# Patient Record
Sex: Male | Born: 1940 | Race: Black or African American | Hispanic: No | Marital: Married | Smoking: Never smoker
Health system: Southern US, Community
[De-identification: ages and names within clinical notes are randomized; demographics above are authoritative.]

## PROBLEM LIST (undated history)

## (undated) DIAGNOSIS — I10 Essential (primary) hypertension: Secondary | ICD-10-CM

## (undated) DIAGNOSIS — M1990 Unspecified osteoarthritis, unspecified site: Secondary | ICD-10-CM

## (undated) DIAGNOSIS — C61 Malignant neoplasm of prostate: Secondary | ICD-10-CM

## (undated) DIAGNOSIS — Z9221 Personal history of antineoplastic chemotherapy: Secondary | ICD-10-CM

## (undated) DIAGNOSIS — E785 Hyperlipidemia, unspecified: Secondary | ICD-10-CM

## (undated) DIAGNOSIS — Z923 Personal history of irradiation: Secondary | ICD-10-CM

## (undated) DIAGNOSIS — M4800 Spinal stenosis, site unspecified: Secondary | ICD-10-CM

## (undated) DIAGNOSIS — C419 Malignant neoplasm of bone and articular cartilage, unspecified: Secondary | ICD-10-CM

## (undated) DIAGNOSIS — C801 Malignant (primary) neoplasm, unspecified: Secondary | ICD-10-CM

## (undated) DIAGNOSIS — E876 Hypokalemia: Secondary | ICD-10-CM

## (undated) DIAGNOSIS — H409 Unspecified glaucoma: Secondary | ICD-10-CM

## (undated) DIAGNOSIS — R630 Anorexia: Secondary | ICD-10-CM

## (undated) DIAGNOSIS — K219 Gastro-esophageal reflux disease without esophagitis: Secondary | ICD-10-CM

## (undated) DIAGNOSIS — R011 Cardiac murmur, unspecified: Secondary | ICD-10-CM

## (undated) DIAGNOSIS — M4802 Spinal stenosis, cervical region: Secondary | ICD-10-CM

## (undated) DIAGNOSIS — N189 Chronic kidney disease, unspecified: Secondary | ICD-10-CM

## (undated) DIAGNOSIS — K5792 Diverticulitis of intestine, part unspecified, without perforation or abscess without bleeding: Secondary | ICD-10-CM

## (undated) DIAGNOSIS — C9000 Multiple myeloma not having achieved remission: Secondary | ICD-10-CM

## (undated) DIAGNOSIS — N400 Enlarged prostate without lower urinary tract symptoms: Secondary | ICD-10-CM

## (undated) DIAGNOSIS — IMO0002 Reserved for concepts with insufficient information to code with codable children: Secondary | ICD-10-CM

## (undated) HISTORY — DX: Malignant (primary) neoplasm, unspecified: C80.1

## (undated) HISTORY — DX: Personal history of antineoplastic chemotherapy: Z92.21

## (undated) HISTORY — DX: Essential (primary) hypertension: I10

## (undated) HISTORY — DX: Unspecified osteoarthritis, unspecified site: M19.90

## (undated) HISTORY — DX: Malignant neoplasm of bone and articular cartilage, unspecified: C41.9

## (undated) HISTORY — DX: Hyperlipidemia, unspecified: E78.5

## (undated) HISTORY — DX: Anorexia: R63.0

## (undated) HISTORY — DX: Diverticulitis of intestine, part unspecified, without perforation or abscess without bleeding: K57.92

## (undated) HISTORY — DX: Spinal stenosis, site unspecified: M48.00

## (undated) HISTORY — DX: Reserved for concepts with insufficient information to code with codable children: IMO0002

## (undated) HISTORY — PX: SHOULDER SURGERY: SHX246

## (undated) HISTORY — DX: Hypokalemia: E87.6

## (undated) HISTORY — DX: Benign prostatic hyperplasia without lower urinary tract symptoms: N40.0

## (undated) HISTORY — DX: Unspecified glaucoma: H40.9

## (undated) HISTORY — DX: Chronic kidney disease, unspecified: N18.9

---

## 1986-07-20 HISTORY — PX: LIPOMA EXCISION: SHX5283

## 2002-10-26 LAB — HM COLONOSCOPY: HM Colonoscopy: NORMAL

## 2004-07-27 ENCOUNTER — Ambulatory Visit: Payer: Self-pay | Admitting: Internal Medicine

## 2004-09-12 ENCOUNTER — Ambulatory Visit: Payer: Self-pay | Admitting: Internal Medicine

## 2004-10-12 ENCOUNTER — Ambulatory Visit: Payer: Self-pay | Admitting: Internal Medicine

## 2004-10-19 ENCOUNTER — Ambulatory Visit: Payer: Self-pay

## 2005-03-22 ENCOUNTER — Ambulatory Visit: Payer: Self-pay | Admitting: Internal Medicine

## 2005-10-11 ENCOUNTER — Ambulatory Visit: Payer: Self-pay | Admitting: Internal Medicine

## 2005-10-18 ENCOUNTER — Ambulatory Visit: Payer: Self-pay | Admitting: Internal Medicine

## 2006-06-21 ENCOUNTER — Ambulatory Visit: Payer: Self-pay | Admitting: Internal Medicine

## 2006-08-20 HISTORY — PX: PROSTATE BIOPSY: SHX241

## 2006-10-22 ENCOUNTER — Ambulatory Visit: Payer: Self-pay | Admitting: Internal Medicine

## 2006-10-22 LAB — CONVERTED CEMR LAB
BUN: 18 mg/dL (ref 6–23)
Cholesterol: 130 mg/dL (ref 0–200)
GFR calc Af Amer: 96 mL/min
GFR calc non Af Amer: 80 mL/min
HDL: 41 mg/dL (ref >39.0)
Sodium: 143 meq/L (ref 135–145)

## 2007-09-16 ENCOUNTER — Encounter: Payer: Self-pay | Admitting: *Deleted

## 2007-09-16 DIAGNOSIS — I10 Essential (primary) hypertension: Secondary | ICD-10-CM | POA: Insufficient documentation

## 2007-09-16 DIAGNOSIS — E785 Hyperlipidemia, unspecified: Secondary | ICD-10-CM

## 2007-10-31 ENCOUNTER — Encounter: Payer: Self-pay | Admitting: Internal Medicine

## 2007-11-12 ENCOUNTER — Ambulatory Visit: Payer: Self-pay | Admitting: Internal Medicine

## 2007-11-12 DIAGNOSIS — N400 Enlarged prostate without lower urinary tract symptoms: Secondary | ICD-10-CM

## 2007-11-12 LAB — CONVERTED CEMR LAB
AST: 28 units/L (ref 0–37)
Albumin: 3.6 g/dL (ref 3.5–5.2)
Alkaline Phosphatase: 55 units/L (ref 39–117)
Bilirubin, Direct: 0.1 mg/dL (ref 0.0–0.3)
CO2: 36 meq/L — ABNORMAL HIGH (ref 19–32)
Cholesterol: 117 mg/dL (ref 0–200)
Glucose, Bld: 93 mg/dL (ref 70–99)
LDL Cholesterol: 65 mg/dL (ref 0–99)
TSH: 1.01 microintl units/mL (ref 0.35–5.50)
Total CHOL/HDL Ratio: 3.4
Triglycerides: 89 mg/dL (ref 0–149)
VLDL: 18 mg/dL (ref 0–40)

## 2008-04-18 ENCOUNTER — Emergency Department (HOSPITAL_COMMUNITY): Admission: EM | Admit: 2008-04-18 | Discharge: 2008-04-18 | Payer: Self-pay | Admitting: Family Medicine

## 2008-05-10 ENCOUNTER — Ambulatory Visit: Payer: Self-pay | Admitting: Internal Medicine

## 2008-05-10 DIAGNOSIS — J069 Acute upper respiratory infection, unspecified: Secondary | ICD-10-CM | POA: Insufficient documentation

## 2008-05-17 ENCOUNTER — Encounter: Payer: Self-pay | Admitting: Internal Medicine

## 2008-07-13 ENCOUNTER — Encounter: Payer: Self-pay | Admitting: Internal Medicine

## 2008-11-15 ENCOUNTER — Ambulatory Visit: Payer: Self-pay | Admitting: Internal Medicine

## 2008-11-15 LAB — CONVERTED CEMR LAB
ALT: 29 units/L (ref 0–53)
Albumin: 3.8 g/dL (ref 3.5–5.2)
BUN: 11 mg/dL (ref 6–23)
Bilirubin, Direct: 0.1 mg/dL (ref 0.0–0.3)
Chloride: 101 meq/L (ref 96–112)
Cholesterol: 121 mg/dL (ref 0–200)
Creatinine, Ser: 0.8 mg/dL (ref 0.4–1.5)
HDL: 36.4 mg/dL — ABNORMAL LOW (ref >39.00)
Sodium: 143 meq/L (ref 135–145)
Total CHOL/HDL Ratio: 3
Triglycerides: 106 mg/dL (ref 0.0–149.0)

## 2009-01-11 ENCOUNTER — Encounter: Payer: Self-pay | Admitting: Internal Medicine

## 2009-07-12 ENCOUNTER — Encounter: Payer: Self-pay | Admitting: Internal Medicine

## 2009-10-26 ENCOUNTER — Encounter: Admission: RE | Admit: 2009-10-26 | Discharge: 2009-10-26 | Payer: Self-pay | Admitting: Orthopedic Surgery

## 2009-11-07 ENCOUNTER — Encounter: Admission: RE | Admit: 2009-11-07 | Discharge: 2009-11-07 | Payer: Self-pay | Admitting: Orthopedic Surgery

## 2009-11-16 ENCOUNTER — Ambulatory Visit: Payer: Self-pay | Admitting: Internal Medicine

## 2009-11-16 LAB — CONVERTED CEMR LAB
BUN: 10 mg/dL (ref 6–23)
Bilirubin, Direct: 0.1 mg/dL (ref 0.0–0.3)
CO2: 34 meq/L — ABNORMAL HIGH (ref 19–32)
Calcium: 9.5 mg/dL (ref 8.4–10.5)
Chloride: 101 meq/L (ref 96–112)
GFR calc non Af Amer: 123.36 mL/min (ref >60)
Sodium: 143 meq/L (ref 135–145)
Total Bilirubin: 0.8 mg/dL (ref 0.3–1.2)
Total CHOL/HDL Ratio: 3
Total Protein: 8.3 g/dL (ref 6.0–8.3)
Triglycerides: 157 mg/dL — ABNORMAL HIGH (ref 0.0–149.0)
VLDL: 31.4 mg/dL (ref 0.0–40.0)

## 2009-11-17 ENCOUNTER — Encounter: Payer: Self-pay | Admitting: Internal Medicine

## 2010-01-12 ENCOUNTER — Encounter: Payer: Self-pay | Admitting: Internal Medicine

## 2010-07-18 ENCOUNTER — Encounter: Payer: Self-pay | Admitting: Internal Medicine

## 2010-09-07 ENCOUNTER — Ambulatory Visit
Admission: RE | Admit: 2010-09-07 | Discharge: 2010-09-07 | Payer: Self-pay | Source: Home / Self Care | Attending: Internal Medicine | Admitting: Internal Medicine

## 2010-09-19 NOTE — Letter (Signed)
[facility]   Imported By: Sherian Rein 07/25/2010 14:53:57  _____________________________________________________________________  External Attachment:    Type:   Image     Comment:   External Document

## 2010-09-19 NOTE — Assessment & Plan Note (Signed)
Summary: YEARLY---STC   Vital Signs:  Patient profile:   70 year old male Height:      69 inches Weight:      163 pounds BMI:     24.16 O2 Sat:      97 % on Room air Temp:     97.2 degrees F oral Pulse rate:   59 / minute BP sitting:   170 / 88  (left arm) Cuff size:   large  Vitals Entered By: [name] (November 16, 2009 10:38 AM)  O2 Flow:  Room air  Primary Care Provider:  Jacques Navy MD   History of Present Illness: Patient presents for a routine medical evaluation. In the interval since his last visigt he has been seeing Dr. Hazle Quant for early glaucoma. He has had laser surgery left eye and is on drops.  He had neck pain. Went to Dr. Montez Morita (ortho): had plain films and MRI. Reviewed images and radiology report with the patient. He is on naproxen sodium 375mg  two times a day and doing neck exercises. He has noted a rise in BP since starting NSAIDs: up to 170/80.    Current Medications (verified): 1)  Atenolol-Chlorthalidone 50-25 Mg  Tabs (Atenolol-Chlorthalidone) .... Take One Tablet Once Daily 2)  B-100   Tabs (Vitamins-Lipotropics) .... Take One Tablet Once Daily 3)  B Complex   Tabs (B Complex Vitamins) .... Take One Tablet Once Daily 4)  Vitamin E 400 Unit  Caps (Vitamin E) .... Take One Capsule Once Daily 5)  Zocor 20 Mg  Tabs (Simvastatin) .... Take One Tablet Once Daily 6)  Vitamin C 500 Mg  Tabs (Ascorbic Acid) .... Take One Tablet Once Daily 7)  Xalatan 0.005 %  Soln (Latanoprost) .... As Directed By Opthal 8)  Combigan 0.2-0.5 % Soln (Brimonidine Tartrate-Timolol) .... 2 Times A Day 9)  Lumigan 0.03 % Soln (Bimatoprost) .... Both Eyes One Drop Once Daily 10)  Naprelan 375 Mg Xr24h-Tab (Naproxen Sodium) .Marland Kitchen.. 1 Tab Once A Day  Allergies (verified): 1)  ! Adhesive Paper (Adhesive Tape)  Past History:  Past Medical History: Last updated: 11-27-2007 HYPERLIPIDEMIA, MILD (ICD-272.4) HYPERTENSION, MILD (ICD-401.1) Benign prostatic hypertrophy early  glaucoma  Past Surgical History: Last updated: 2007/11/27 excision of a lipoma-left shoulder/scapula  Family History: Last updated: 2007-11-27 father-deceased @ 90: pneumonia, BPH, CAD/PTCA mother- 85; decreased mobility, no major medical problems. 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  Review of Systems  The patient denies anorexia, fever, weight loss, weight gain, vision loss, hoarseness, chest pain, syncope, dyspnea on exertion, prolonged cough, headaches, abdominal pain, severe indigestion/heartburn, incontinence, muscle weakness, suspicious skin lesions, difficulty walking, depression, abnormal bleeding, enlarged lymph nodes, angioedema, and testicular masses.    Physical Exam  General:  Slender AA male who looks his stated age and is in no distress Head:  Normocephalic  and atraumatic without obvious abnormalities. No apparent alopecia or balding. Eyes:  vision grossly intact, pupils equal, and pupils round.  Inject bulbar conjunctiva OS. Further exam deferred to Dr. Tiffany Kocher:  External ear exam shows no significant lesions or deformities.  Otoscopic examination reveals clear canals, tympanic membranes are intact bilaterally without bulging,  retraction, inflammation or discharge. Hearing is grossly normal bilaterally. Nose:  no external deformity and no external erythema.   Mouth:  Oral mucosa and oropharynx without lesions or exudates.  Teeth in good repair. Neck:  full ROM, no thyromegaly, and no carotid bruits.   Chest Wall:  no deformities and no masses.   Lungs:  Normal respiratory effort, chest expands symmetrically. Lungs are clear to auscultation, no crackles or wheezes. Heart:  Normal rate and regular rhythm. S1 and S2 normal without gallop, murmur, click, rub or other extra sounds. Abdomen:  soft, non-tender, normal bowel sounds, no guarding, no rigidity, and no hepatomegaly.   Rectal:  deferred to GU Prostate:  deferred to GU Msk:  normal ROM, no joint tenderness, no joint swelling, no joint warmth, no redness over joints, and no joint deformities.   Pulses:  2+ radial and DP pulses Extremities:  No clubbing, cyanosis, edema, or deformity noted with normal full range of motion of all joints.   Neurologic:  alert & oriented X3, cranial nerves II-XII intact, strength normal in all extremities, gait normal, and DTRs symmetrical and normal.   Skin:  turgor normal, color normal, no ulcerations, and no edema.  Several small comedones on the upper back Cervical Nodes:  no anterior cervical adenopathy and no posterior cervical adenopathy.   Psych:  Oriented X3, memory intact for recent and remote, normally interactive, and good eye contact.     Impression & Recommendations:  Problem # 1:  BENIGN PROSTATIC HYPERTROPHY (ICD-600.00) Followed closely by GU. He has had Bx x 2 that were unremarkable.  Plan - continued surveillance  Problem # 2:  HYPERLIPIDEMIA, MILD (ICD-272.4) For lab today with recommendations to follow.  His updated medication list for this problem includes:    Zocor 20 Mg Tabs (Simvastatin) .Marland Kitchen... Take one tablet once daily  Orders: TLB-Lipid Panel (80061-LIPID) TLB-Hepatic/Liver Function Pnl  (80076-HEPATIC) TLB-TSH (Thyroid Stimulating Hormone) (84443-TSH)  Addendum - excellant control with LDL 56. Normal liver functions and thyroid function Plan - continue present medication.  Problem # 3:  HYPERTENSION, MILD (ICD-401.1)  His updated medication list for this problem includes:    Atenolol-chlorthalidone 50-25 Mg Tabs (Atenolol-chlorthalidone) .Marland Kitchen... Take one tablet once daily  Orders: EKG w/ Interpretation (93000) TLB-BMP (Basic Metabolic Panel-BMET) (80048-METABOL)  BP today: 170/88 Prior BP: 154/92 (11/15/2008)  Suboptimal control, possibly due to NSAIDs (naprolene)  Plan  stop NSAID if possible - may substitute tylenol for pain control          monitor at home and report back - adjustment in medication as indicated.  Problem # 4:  Preventive Health Care (ICD-V70.0) Normal history and normal physical exam. Lab results are fine. EKG with mild left ventricular hypertrophy. Chest x-ray normal. Due for tetnus, pneumonia and shingles vaccines. Current with colonoscopy with last study in '04.  In summary - a very nice gentleman who appears to be medically stable. He will return as needed or 1 year.  Complete Medication List: 1)  Atenolol-chlorthalidone 50-25 Mg Tabs (Atenolol-chlorthalidone) .... Take one tablet once daily 2)  B-100 Tabs (Vitamins-lipotropics) .... Take one tablet once daily 3)  B Complex Tabs (B complex vitamins) .Marland KitchenMarland KitchenMarland Kitchen  Take one tablet once daily 4)  Vitamin E 400 Unit Caps (Vitamin e) .... Take one capsule once daily 5)  Zocor 20 Mg Tabs (Simvastatin) .... Take one tablet once daily 6)  Vitamin C 500 Mg Tabs (Ascorbic acid) .... Take one tablet once daily 7)  Xalatan 0.005 % Soln (Latanoprost) .... As directed by opthal 8)  Combigan 0.2-0.5 % Soln (Brimonidine tartrate-timolol) .... 2 times a day 9)  Lumigan 0.03 % Soln (Bimatoprost) .... Both eyes one drop once daily 10)  Naprelan 375 Mg Xr24h-tab (Naproxen sodium) .Marland Kitchen.. 1 tab once a day  Other  Orders: T-2 View CXR (71020TC)  ient: Trevor Michael Note: All result statuses are Final unless otherwise noted.  Tests: (1) Lipid Panel (LIPID)   Cholesterol               127 mg/dL                   1-610     ATP III Classification            Desirable:  < 200 mg/dL                    Borderline High:  200 - 239 mg/dL               High:  > = 240 mg/dL   Triglycerides        [H]  157.0 mg/dL                 9.6-045.4     Normal:  <150 mg/dL     Borderline High:  098 - 199 mg/dL   HDL                       11.91 mg/dL                 >47.82   VLDL Cholesterol          31.4 mg/dL                  9.5-62.1   LDL Cholesterol           56 mg/dL                    3-08  CHO/HDL Ratio:  CHD Risk                             3                    Men          Women     1/2 Average Risk     3.4          3.3     Average Risk          5.0          4.4     2X Average Risk          9.6          7.1     3X Average Risk          15.0          11.0                           Tests: (2) Hepatic/Liver Function Panel (HEPATIC)   Total  Bilirubin           0.8 mg/dL                   1.6-1.0   Direct Bilirubin          0.1 mg/dL                   9.6-0.4   Alkaline Phosphatase      88 U/L                      39-117   AST                       29 U/L                      0-37   ALT                       30 U/L                      0-53   Total Protein             8.3 g/dL                    5.4-0.9   Albumin                   3.9 g/dL                    8.1-1.9  Tests: (3) BMP (METABOL)   Sodium                    143 mEq/L                   135-145   Potassium                 3.8 mEq/L                   3.5-5.1   Chloride                  101 mEq/L                   96-112   Carbon Dioxide       [H]  34 mEq/L                    19-32   Glucose                   81 mg/dL                    14-78   BUN                       10 mg/dL                    2-95   Creatinine                0.8 mg/dL                    6.2-1.3   Calcium                   9.5 mg/dL  8.4-10.5   GFR                       123.36 mL/min               >60  Tests: (4) TSH (TSH)   FastTSH                   1.18 uIU/mL                 0.35-5.50  G CHEST 2 VIEW - [id number]   Clinical Data: Physical, health screening.   CHEST - 2 VIEW   Comparison: None.   Findings: Trachea is midline.  Heart size normal.  Biapical pleural parenchymal scarring.  Lungs are otherwise clear.  No pleural fluid.   IMPRESSION: No acute findings.   Read By:  Reyes Ivan.,  M.D.Prescriptions: ZOCOR 20 MG  TABS (SIMVASTATIN) Take one tablet once daily  #30 Tablet x 12   Entered and Authorized by:   Jacques Navy MD   Signed by:   Jacques Navy MD on 11/16/2009   Method used:   Electronically to        [facility] [address] [phone]* (retail)       [address]       [place], [place]  [address]       Ph: [phone] or [phone]       Fax: [phone]   RxID:   [id number] ATENOLOL-CHLORTHALIDONE 50-25 MG  TABS (ATENOLOL-CHLORTHALIDONE) Take one tablet once daily  #30 x 12   Entered and Authorized by:   Jacques Navy MD   Signed by:   Jacques Navy MD on 11/16/2009   Method used:   [facility]        [facility] [address] [phone]* (retail)       [address]       [place], [place]  [address]       Ph: [phone] or [phone]       Fax: [phone]   RxID:   [id number]

## 2010-09-19 NOTE — Letter (Signed)
Summary: [facility]   Imported By: Lester Moosup 01/19/2010 11:06:04  _____________________________________________________________________  External Attachment:    Type:   Image     Comment:   External Document

## 2010-09-21 NOTE — Assessment & Plan Note (Signed)
Summary: swollen right ankle/last ov early 2011/#/cd   Vital Signs:  Patient profile:   70 year old male Height:      69 inches Weight:      162 pounds BMI:     24.01 O2 Sat:      97 % on Room air Temp:     97.9 degrees F oral Pulse rate:   65 / minute BP sitting:   154 / 80  (left arm) Cuff size:   regular  Vitals Entered By: [name] (September 07, 2010 3:12 PM)  O2 Flow:  Room air CC: pt here for evaluation of knot on his lower right leg/ ab   Primary Care Provider:  Jacques Navy MD  CC:  pt here for evaluation of knot on his lower right leg/ ab.  History of Present Illness: Mr. Schwer presents with a small knot on the distal right leg with a discoloration of the skin. He may have struck his leg against a hard edge.  Current Medications (verified): 1)  Atenolol-Chlorthalidone 50-25 Mg  Tabs (Atenolol-Chlorthalidone) .... Take One Tablet Once Daily 2)  B-100   Tabs (Vitamins-Lipotropics) .... Take One Tablet Once Daily 3)  B Complex   Tabs (B Complex Vitamins) .... Take One Tablet Once Daily 4)  Vitamin E 400 Unit  Caps (Vitamin E) .... Take One Capsule Once Daily 5)  Zocor 20 Mg  Tabs (Simvastatin) .... Take One Tablet Once Daily 6)  Vitamin C 500 Mg  Tabs (Ascorbic Acid) .... Take One Tablet Once Daily 7)  Xalatan 0.005 %  Soln (Latanoprost) .... As Directed By Opthal 8)  Combigan 0.2-0.5 % Soln (Brimonidine Tartrate-Timolol) .... 2 Times A Day 9)  Lumigan 0.03 % Soln (Bimatoprost) .... Both Eyes One Drop Once Daily 10)  Naprelan 375 Mg Xr24h-Tab (Naproxen Sodium) .Marland Kitchen.. 1 Tab Once A Day  Allergies (verified): 1)  ! Adhesive Paper (Adhesive Tape) PMH-FH-SH reviewed-no changes except otherwise noted  Review of Systems       The patient complains of suspicious skin lesions.  The patient denies fever, chest pain, dyspnea on exertion, muscle weakness, difficulty walking, abnormal bleeding, and enlarged lymph nodes.    Physical Exam  General:   Well-developed,well-nourished,in no acute distress; alert,appropriate and cooperative throughout examination Skin:  2 cm firm nodule at he distal right LE with a descending bruise. There is no tenderness. There is no obvious vein involvement. Dark discoloration extends to the foot.   Impression & Recommendations:  Problem # 1:  HEMATOMA (ICD-924.9) uperficial hematoma to the distal right leg with dissecting drainage to the foot.  Patient reassured of the superficial nature of the injury and that there isw o risk, i.e. DVT.  Plan - application of heat.           APAP if needed for discomfort.   Complete Medication List: 1)  Atenolol-chlorthalidone 50-25 Mg Tabs (Atenolol-chlorthalidone) .... Take one tablet once daily 2)  B-100 Tabs (Vitamins-lipotropics) .... Take one tablet once daily 3)  B Complex Tabs (B complex vitamins) .... Take one tablet once daily 4)  Vitamin E 400 Unit Caps (Vitamin e) .... Take one capsule once daily 5)  Zocor 20 Mg Tabs (Simvastatin) .... Take one tablet once daily 6)  Vitamin C 500 Mg Tabs (Ascorbic acid) .... Take one tablet once daily 7)  Xalatan 0.005 % Soln (Latanoprost) .... As directed by opthal 8)  Combigan 0.2-0.5 % Soln (Brimonidine tartrate-timolol) .... 2 times a day 9)  Lumigan 0.03 % Soln (Bimatoprost) .... Both eyes one drop once daily 10)  Naprelan 375 Mg Xr24h-tab (Naproxen sodium) .Marland Kitchen.. 1 tab once a day   Orders Added: 1)  Est. Patient Level II [16109]

## 2010-11-30 ENCOUNTER — Other Ambulatory Visit: Payer: Self-pay | Admitting: Internal Medicine

## 2010-12-05 ENCOUNTER — Other Ambulatory Visit: Payer: Self-pay | Admitting: Internal Medicine

## 2010-12-19 ENCOUNTER — Ambulatory Visit (INDEPENDENT_AMBULATORY_CARE_PROVIDER_SITE_OTHER): Payer: Medicare Other | Admitting: Internal Medicine

## 2010-12-19 ENCOUNTER — Encounter: Payer: Self-pay | Admitting: Internal Medicine

## 2010-12-19 DIAGNOSIS — Z0000 Encounter for general adult medical examination without abnormal findings: Secondary | ICD-10-CM

## 2010-12-19 DIAGNOSIS — N400 Enlarged prostate without lower urinary tract symptoms: Secondary | ICD-10-CM

## 2010-12-19 DIAGNOSIS — I10 Essential (primary) hypertension: Secondary | ICD-10-CM

## 2010-12-19 DIAGNOSIS — E785 Hyperlipidemia, unspecified: Secondary | ICD-10-CM

## 2010-12-19 MED ORDER — AMLODIPINE BESYLATE 5 MG PO TABS: 5.0000 mg | ORAL_TABLET | Freq: Every day | ORAL | Status: DC

## 2010-12-19 NOTE — Progress Notes (Signed)
Subjective:    Patient ID: Trevor Michael, male    DOB: 1940-09-14, 70 y.o.   MRN: [id number]  HPI  The patient is here for annual Medicare wellness examination and management of other chronic and acute problems.   The risk factors are reflected in the social history.  The roster of all physicians providing medical care to patient - is listed in the Snapshot section of the chart.  Activities of daily living:  The patient is 100% inedpendent in all ADLs: dressing, toileting, feeding as well as independent mobility  Home safety : The patient has smoke detectors in the home. They wear seatbelts .No firearms at home . There is no violence in the home.   There is no risks for hepatitis, STDs or HIV. There is no history of blood transfusion. They have no travel history to infectious disease endemic areas of the world.  The patient has seen their dentist in the last six month. They have seen their eye doctor in the last year and early stages of glaucoma were detected pt has follow up appointment with his eye doctor in August. They deny any hearing difficulty and have not had audiologic testing in the last year.  They do not  have excessive sun exposure. Discussed the need for sun protection: hats, long sleeves and use of sunscreen if there is significant sun exposure.   Diet: the importance of a healthy diet is discussed. They do have a healthy (unhealthy-high fat/fast food) diet.  Past Medical History  Diagnosis Date  . Other and unspecified hyperlipidemia   . Essential hypertension, benign   . BPH (benign prostatic hyperplasia)   . Early stage glaucoma    Past Surgical History  Procedure Date  . Lipoma excision     left shoulder/scapula   Family History  Problem Relation Age of Onset  . Benign prostatic hyperplasia Father   . Pneumonia Father   . Coronary artery disease Father     PTCA   History   Social History  . Marital Status: Married    Spouse Name: N/A    Number of  Children: N/A  . Years of Education: N/A   Occupational History  . Not on file.   Social History Main Topics  . Smoking status: Never Smoker   . Smokeless tobacco: Never Used  . Alcohol Use: Not on file  . Drug Use: Not on file  . Sexually Active: Not on file   Other Topics Concern  . 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       Review of Systems Review of Systems  Constitutional:  Negative for fever, chills, activity change and unexpected weight change.  HENT:  Negative for hearing loss, ear pain, congestion, neck stiffness and postnasal drip.   Eyes: Negative for pain, discharge and visual disturbance.  Respiratory: Negative for chest tightness and wheezing.   Cardiovascular: Negative for chest pain and palpitations.       [No decreased exercise tolerance Gastrointestinal: [No change in bowel habit. No bloating or gas. No reflux or indigestion Genitourinary: Negative for urgency, frequency, flank pain and difficulty urinating.  Musculoskeletal: Negative for myalgias, back pain, arthralgias and gait problem.  Neurological: Negative for dizziness, tremors, weakness and headaches.  Hematological: Negative for adenopathy.  Psychiatric/Behavioral: Negative  for behavioral problems and dysphoric mood.       Objective:   Physical Exam Constitutional: He is oriented to person, place, and time. He appears well-developed and well-nourished.       Healthy appearing AA male in no acute distress  HENT:  Head: Normocephalic and atraumatic.  Right Ear: External ear normal. EAC/TM nl Left Ear: External ear normal.  EAC/TM nl Nose: Nose normal.  Mouth/Throat: Oropharynx is clear and moist.  Eyes: Conjunctivae and EOM are  normal. Pupils are equal, round, and reactive to light. Right eye exhibits no discharge. Left eye exhibits no discharge. No scleral icterus.  Neck: Normal range of motion. Neck supple. No JVD present. No tracheal deviation present. No thyromegaly present.  Cardiovascular: Normal rate, regular rhythm and normal heart sounds.  Exam reveals no gallop and no friction rub.   No murmur heard.      Quiet precordium. 2+ radial and DP pulses  Pulmonary/Chest: Effort normal. No respiratory distress. He has no wheezes. He has no rales. He exhibits no tenderness.       No chest wall deformity  Abdominal: Soft. Bowel sounds are normal. He exhibits no distension. There is no tenderness. There is no rebound and no guarding.       No heptosplenomegaly  Genitourinary: Deferred to Dr. Isabel Caprice     Musculoskeletal: Normal range of motion. He exhibits no edema and no tenderness.       Small and large joints without redness, synovial thickening or deformity. Full range of motion preserved about all small, median and large joints.  Lymphadenopathy:    He has no cervical adenopathy.  Neurological: He is alert and oriented to person, place, and time. He has normal reflexes. No cranial nerve deficit. Coordination normal.  Skin: Skin is warm and dry. No rash noted. No erythema.  Psychiatric: He has a normal mood and affect. His behavior is normal. Thought content normal.            Assessment & Plan:  1. Hypertension - elevated and not at goal. He reports that his SBP is greater than 140 at home.  Plan - will add amlodipine 5mg  qd.           Patient to monitor BP at home and report back. If SBP not consistently less than 140 will increase amlodipine to 10mg   2. Hyperlipidemia - he has been previously controlled  Plan - lipid panel with recommendations to follow.   3. GU- followed by Dr. Isabel Caprice for BPH with elevated PSA. He has been doing well and is stable.  4. Health maintenance - interval history - he is  doing well. He does continue to follow with Dr. Hazle Quant for glaucoma, s/p laser surgery left eye and on continued medical management. Physical exam today is normal. Labs are pending will be sent under separate cover. He is current with colorectal cancer screening with last colonoscopy 2004. Immunizations: he has has had shingles, he has had pneumonia vaccine, he is given Tdap today.   In summary - a very nice gentleman who appears to be medically stable with lab pending. He is current with all health maintenance. He remains vibrant, active and healthy. He will return as needed or in 1 year.

## 2010-12-20 ENCOUNTER — Other Ambulatory Visit (INDEPENDENT_AMBULATORY_CARE_PROVIDER_SITE_OTHER): Payer: Medicare Other

## 2010-12-20 DIAGNOSIS — N400 Enlarged prostate without lower urinary tract symptoms: Secondary | ICD-10-CM

## 2010-12-20 DIAGNOSIS — E785 Hyperlipidemia, unspecified: Secondary | ICD-10-CM

## 2010-12-20 DIAGNOSIS — I10 Essential (primary) hypertension: Secondary | ICD-10-CM

## 2010-12-20 LAB — LIPID PANEL
Cholesterol: 70 mg/dL (ref 0–200)
HDL: 19.3 mg/dL — ABNORMAL LOW (ref >39.00)
Total CHOL/HDL Ratio: 4
Triglycerides: 85 mg/dL (ref 0.0–149.0)
VLDL: 17 mg/dL (ref 0.0–40.0)

## 2010-12-20 LAB — TSH: TSH: 1.08 u[IU]/mL (ref 0.35–5.50)

## 2010-12-20 LAB — COMPREHENSIVE METABOLIC PANEL
CO2: 30 mEq/L (ref 19–32)
Calcium: 9.2 mg/dL (ref 8.4–10.5)
Potassium: 4.1 mEq/L (ref 3.5–5.1)
Sodium: 140 mEq/L (ref 135–145)
Total Bilirubin: 0.6 mg/dL (ref 0.3–1.2)
Total Protein: 7.8 g/dL (ref 6.0–8.3)

## 2010-12-20 LAB — HEPATIC FUNCTION PANEL
ALT: 23 U/L (ref 0–53)
AST: 31 U/L (ref 0–37)
Alkaline Phosphatase: 108 U/L (ref 39–117)
Bilirubin, Direct: 0 mg/dL (ref 0.0–0.3)

## 2010-12-20 LAB — CBC WITH DIFFERENTIAL/PLATELET
Hemoglobin: 11.7 g/dL — ABNORMAL LOW (ref 13.0–17.0)
Lymphocytes Relative: 9.9 % — ABNORMAL LOW (ref 12.0–46.0)
MCHC: 34 g/dL (ref 30.0–36.0)
MCV: 98.2 fl (ref 78.0–100.0)
Monocytes Absolute: 1.5 10*3/uL — ABNORMAL HIGH (ref 0.1–1.0)
Neutro Abs: 14.2 10*3/uL — ABNORMAL HIGH (ref 1.4–7.7)
RDW: 14.3 % (ref 11.5–14.6)

## 2010-12-24 ENCOUNTER — Encounter: Payer: Self-pay | Admitting: Internal Medicine

## 2010-12-30 ENCOUNTER — Other Ambulatory Visit: Payer: Self-pay | Admitting: Internal Medicine

## 2011-01-05 NOTE — Assessment & Plan Note (Signed)
[facility]                           PRIMARY CARE OFFICE NOTE   SHLOME, BALDREE                      MRN:          [id number]  DATE:10/22/2006                            DOB:          April 06, 1941    [name] is a delightful 70 year old African American gentleman well  known to me followed for general health care, who presents for routine  evaluation and exam and followup for his chronic medical problems.  Patient was last seen October 18, 2005 please see that complete dictation.  In the interval he has maintained his good health. Patient has continued  to see Dr. [name] for urology, with last visit in September which  was unremarkable. Of note he did undergo prostate biopsies which were  negative.   PAST MEDICAL HISTORY:   SURGERIES:  None.   MEDICAL:  1. Patient has had usual childhood disease.  2. Mild hypertension.  3. Mild hyperlipidemia.  4. Mildly elevated PSA.   CURRENT MEDICATIONS:  1. Atenolol.  2. Chlorthalidone 50/25 once daily.  3. Zocor 20 mg daily.  4. Vitamin B 100.  5. Garlic.  6. B-Complex.  7. Vitamin E.  8. Vitamin C.   FAMILY HISTORY:  Noncontributory.   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 He  is making a reasonable recovery from the tragic loss of his daughter.  Patient continues to active in civic affairs. He reports that he is  playing more golf, trying to enjoy life more.   CHART REVIEW:  Last colonoscopy October 21, 2002 with a normal study.   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 Last note from Dr. [name] in the chart is from  April 25, 2006 and it was felt the patient was stable and was to be  monitored on q. 6 month basis with and appointment coming up in April.   REVIEW OF SYSTEMS:  Negative for any constitutional, cardiovascular,  respiratory, GI, or GU complaints.   PHYSICAL EXAMINATION:  Temperature was 98.3, blood pressure 147/85,  pulse 71, weight 167.  GENERAL APPEARANCE: A very trim gentleman looking younger then his  stated chronologic age in no acute distress.  HEENT EXAM: Normocephalic, atraumatic, EACs and TM s were unremarkable.  Oropharynx with native dentition in good repair with no buccal or palate  lesions noted, posterior pharynx was clear. Patient has injection of the  bulbar conjunctivae at the medial aspect left greater than right. Pupils  equal, round, and reactive. Funduscopic exam  deferred to Dr.  Nicholes Rough.  NECK: Supple without thyromegaly.  NODES: No lymphadenopathy was noted in the cervical, supraclavicular  regions.  CHEST: With CVA tenderness.  LUNGS: Clear to auscultation and percussion.  CARDIOVASCULAR: 2+ radial pulse, no JVD or carotid bruits. He had a  quiet precordium with a regular rate and rhythm without murmurs, rubs,  or gallops.  ABDOMEN: Soft, no guarding or rebound. No organosplenomegaly was  appreciated.  GENITALIA AND RECTAL EXAMS: Deferred to Dr.  Grapey.  EXTREMITIES: Without clubbing, cyanosis, edema, or deformity.  NEUROLOGIC EXAM: Nonfocal.  SKIN: Clear.   Laboratory revealed a normal thyroid function with a TSH of 0.86, lipid  panel revealed a cholesterol of 130, triglycerides of 64, HDL was  excellent at 41, LDL was excellent at 76, basic metabolic panel revealed  serum glucose of 109, creatinine of 1.0, electrolytes were normal, GFR  was normal at 96 milliliters per minute, liver functions were normal.   ASSESSMENT/PLAN:  1. Hypertension, patient's blood pressure is adequately controlled on      his present medical regimen, he will continue the same.  2. Lipids, patient has excellent control on his present medical      regimen and will continue the same.  3. Genitourinary, patient with mild PSA elevation being followed on a      regular basis by Dr.  Isabel Caprice.  4. Health maintenance, patient is current with colorectal cancer      screening, has had a recent EKG, was unremarkable.   SUMMARY:  A very pleasant gentleman who is in excellent health at this  time. I would have him continue on his present medications as noted  above. He has asked to return to see me on a p.r.n. basis or in 1 year.     Trevor Gess Norins, MD  Electronically Signed    MEN/MedQ  DD: 10/23/2006  DT: 10/23/2006  Job #: [id number]   cc:   [name], M.D.  Rozann Lesches

## 2011-01-31 ENCOUNTER — Other Ambulatory Visit: Payer: Self-pay | Admitting: Internal Medicine

## 2011-02-28 ENCOUNTER — Other Ambulatory Visit: Payer: Self-pay | Admitting: Internal Medicine

## 2011-11-20 ENCOUNTER — Encounter: Payer: Medicare Other | Admitting: Internal Medicine

## 2011-11-23 ENCOUNTER — Other Ambulatory Visit: Payer: Self-pay | Admitting: Internal Medicine

## 2011-12-12 ENCOUNTER — Other Ambulatory Visit: Payer: Self-pay | Admitting: Internal Medicine

## 2011-12-20 ENCOUNTER — Other Ambulatory Visit: Payer: Self-pay | Admitting: Internal Medicine

## 2011-12-20 ENCOUNTER — Other Ambulatory Visit (INDEPENDENT_AMBULATORY_CARE_PROVIDER_SITE_OTHER): Payer: Medicare Other

## 2011-12-20 ENCOUNTER — Encounter: Payer: Self-pay | Admitting: Internal Medicine

## 2011-12-20 ENCOUNTER — Ambulatory Visit (INDEPENDENT_AMBULATORY_CARE_PROVIDER_SITE_OTHER): Payer: Medicare Other | Admitting: Internal Medicine

## 2011-12-20 VITALS — BP 160/90 | HR 54 | Temp 97.8°F | Resp 16 | Ht 69.0 in | Wt 157.0 lb

## 2011-12-20 DIAGNOSIS — I10 Essential (primary) hypertension: Secondary | ICD-10-CM

## 2011-12-20 DIAGNOSIS — E785 Hyperlipidemia, unspecified: Secondary | ICD-10-CM

## 2011-12-20 DIAGNOSIS — Z0000 Encounter for general adult medical examination without abnormal findings: Secondary | ICD-10-CM

## 2011-12-20 DIAGNOSIS — N400 Enlarged prostate without lower urinary tract symptoms: Secondary | ICD-10-CM

## 2011-12-20 LAB — LIPID PANEL
Cholesterol: 60 mg/dL (ref 0–200)
HDL: 28.3 mg/dL — ABNORMAL LOW (ref >39.00)
LDL Cholesterol: 0 mg/dL (ref 0–99)

## 2011-12-20 LAB — HEPATIC FUNCTION PANEL
Bilirubin, Direct: 0 mg/dL (ref 0.0–0.3)
Total Bilirubin: 0.7 mg/dL (ref 0.3–1.2)
Total Protein: 9.3 g/dL — ABNORMAL HIGH (ref 6.0–8.3)

## 2011-12-20 LAB — COMPREHENSIVE METABOLIC PANEL
AST: 35 U/L (ref 0–37)
BUN: 17 mg/dL (ref 6–23)
CO2: 29 mEq/L (ref 19–32)
Calcium: 9.6 mg/dL (ref 8.4–10.5)
Chloride: 98 mEq/L (ref 96–112)
Creatinine, Ser: 0.9 mg/dL (ref 0.4–1.5)
Potassium: 3.4 mEq/L — ABNORMAL LOW (ref 3.5–5.1)

## 2011-12-20 MED ORDER — SIMVASTATIN 20 MG PO TABS: 20.0000 mg | ORAL_TABLET | Freq: Every day | ORAL | Status: DC

## 2011-12-20 MED ORDER — AMLODIPINE BESYLATE 5 MG PO TABS: 5.0000 mg | ORAL_TABLET | Freq: Every day | ORAL | Status: DC

## 2011-12-20 MED ORDER — ATENOLOL-CHLORTHALIDONE 50-25 MG PO TABS: 30.0000 | ORAL_TABLET | Freq: Every day | ORAL | Status: DC

## 2011-12-20 NOTE — Progress Notes (Signed)
Subjective:    Patient ID: Trevor Michael, male    DOB: 1941-05-29, 71 y.o.   MRN: [id number]  HPI Trevor Michael is here for annual Medicare wellness examination and management of other chronic and acute problems. He reports a good year: some increased allergy problems; on-going glaucoma. He is getting a good response to Zantac when he needs it.    The risk factors are reflected in the social history.  The roster of all physicians providing medical care to patient - is listed in the Snapshot section of the chart.  Activities of daily living:  The patient is 100% inedpendent in all ADLs: dressing, toileting, feeding as well as independent mobility  Home safety : The patient has smoke detectors in the home. Fall - house is fall safe. They wear seatbelts. No firearms at home  There is no risks for hepatitis, STDs or HIV. There is no   history of blood transfusion. They have no travel history to infectious disease endemic areas of the world.  The patient has seen their dentist in the last six month. They have seen their eye doctor in the last year. They deny any hearing difficulty and have not had audiologic testing in the last year.  They do not  have excessive sun exposure. Discussed the need for sun protection: hats, long sleeves and use of sunscreen if there is significant sun exposure.   Diet: the importance of a healthy diet is discussed. They do have a healthy diet.  The patient has a regular exercise program: 21 minute workout, golfing, walking, 1.5 miles walk  , 30-60 minute duration, 6 days per week.  The benefits of regular aerobic exercise were discussed.  Depression screen: there are no signs or vegative symptoms of depression- irritability, change in appetite, anhedonia, sadness/tearfullness.  Cognitive assessment: the patient manages all their financial and personal affairs and is actively engaged.   The following portions of the patient's history were reviewed and updated as  appropriate: allergies, current medications, past family history, past medical history,  past surgical history, past social history  and problem list.  Vision, hearing, body mass index were assessed and reviewed.   During the course of the visit the patient was educated and counseled about appropriate screening and preventive services including : fall prevention , diabetes screening, nutrition counseling, colorectal cancer screening, and recommended immunizations.  Past Medical History  Diagnosis Date  . Other and unspecified hyperlipidemia   . Essential hypertension, benign   . BPH (benign prostatic hyperplasia)   . Early stage glaucoma    Past Surgical History  Procedure Date  . Lipoma excision     left shoulder/scapula   Family History  Problem Relation Age of Onset  . Benign prostatic hyperplasia Father   . Pneumonia Father   . Coronary artery disease Father     PTCA  . Heart disease Brother     CABG, CAD,  . Benign prostatic hyperplasia Brother    History   Social History  . Marital Status: Married    Spouse Name: N/A    Number of Children: N/A  . Years of Education: 51   Occupational History  . businessman     semi-retired   Social History Main Topics  . Smoking status: Never Smoker   . Smokeless tobacco: Never Used  . Alcohol Use: No  . Drug Use: Not on file  . Sexually Active: Yes -- Male partner(s)   Other Topics Concern  . Not on file  Social History Narrative   [employer] BS, MBA [employer].  Married 02/05/1962. 1 daughter '64-deceased 05-16-2023 sepsis.  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 Marriage in good health. Playing golf. Doing well overwell.     Current Outpatient Prescriptions on File Prior to Visit  Medication Sig Dispense Refill  .  amLODipine (NORVASC) 5 MG tablet TAKE 1 TABLET BY MOUTH EVERY DAY  30 tablet  0  . b complex vitamins tablet Take 1 tablet by mouth daily.        . bimatoprost (LUMIGAN) 0.03 % ophthalmic drops Place 1 drop into both eyes at bedtime.        . brimonidine-timolol (COMBIGAN) 0.2-0.5 % ophthalmic solution Place 1 drop into both eyes 2 (two) times daily.        . simvastatin (ZOCOR) 20 MG tablet TAKE ONE TABLET ONCE DAILY  30 tablet  3  . Thiamine HCl (VITAMIN B-1) 100 MG tablet Take 100 mg by mouth daily.        Marland Kitchen latanoprost (XALATAN) 0.005 % ophthalmic solution 1 drop as directed.        . Naproxen Sodium (NAPRELAN) 375 MG TB24 Take 1 tablet by mouth daily.        Marland Kitchen DISCONTD: atenolol-chlorthalidone (TENORETIC) 50-25 MG per tablet TAKE ONE TABLET ONCE DAILY  30 tablet  9      Review of Systems Constitutional:  Negative for fever, chills, activity change and unexpected weight change.  HEENT:  Negative for hearing loss, ear pain, congestion, neck stiffness and postnasal drip. Negative for sore throat or swallowing problems. Negative for dental complaints.   Eyes: Negative for vision loss or change in visual acuity.  Respiratory: Negative for chest tightness and wheezing. Negative for DOE.   Cardiovascular: Negative for chest pain or palpitations. No decreased exercise tolerance Gastrointestinal: No change in bowel habit. No bloating or gas. No reflux or indigestion Genitourinary: Negative for urgency, frequency, flank pain and difficulty urinating.  Musculoskeletal: Negative for myalgias, back pain, arthralgias and gait problem.  Neurological: Negative for dizziness, tremors, weakness and headaches.  Hematological: Negative for adenopathy.  Psychiatric/Behavioral: Negative for behavioral problems and dysphoric mood.       Objective:   Physical Exam Filed Vitals:   12/20/11 1349  BP: 160/90  Pulse: 54  Temp: 97.8 F (36.6 C)  Resp: 16   Wt Readings from Last 3 Encounters:  12/20/11  157 lb (71.215 kg)  12/19/10 158 lb (71.668 kg)  09/07/10 162 lb (73.483 kg)    Gen'l: Well nourished well developed AA male in no acute distress  HEENT: Head: Normocephalic and atraumatic. Right Ear: External ear normal. EAC/TM nl. Left Ear: External ear normal.  EAC/TM nl. Nose: Nose normal. Mouth/Throat: Oropharynx is clear and moist. Dentition - native, in good repair. No buccal or palatal lesions. Posterior pharynx clear. Eyes: Conjunctivae and sclera clear. EOM intact. Pupils are equal, round, and reactive to light. Right eye exhibits no discharge. Left eye exhibits no discharge. Neck: Normal range of motion. Neck supple. No JVD present. No tracheal deviation present. No thyromegaly present.  Cardiovascular: Normal rate, regular rhythm, no gallop, no friction rub, no murmur heard.      Quiet precordium. 2+ radial and DP pulses . No carotid bruits Pulmonary/Chest: Effort normal. No respiratory distress or increased WOB, no wheezes, no rales.  No chest wall deformity or CVAT. Abdominal: Soft. Bowel sounds are normal in all quadrants. He exhibits no distension, no tenderness, no rebound or guarding, No heptosplenomegaly  Genitourinary:  deferred to GU Musculoskeletal: Normal range of motion. He exhibits no edema and no tenderness.       Small and large joints without redness, synovial thickening or deformity. Full range of motion preserved about all small, median and large joints.  Lymphadenopathy:    He has no cervical or supraclavicular adenopathy.  Neurological: He is alert and oriented to person, place, and time. CN II-XII intact. DTRs 2+ and symmetrical biceps, radial and patellar tendons. Cerebellar function normal with no tremor, rigidity, normal gait and station.  Skin: Skin is warm and dry. No rash noted. No erythema.  Psychiatric: He has a normal mood and affect. His behavior is normal. Thought content normal.   Lab Results  Component Value Date                       GLUCOSE 81  12/20/2011   CHOL 60 12/20/2011   TRIG 157.0* 12/20/2011   HDL 28.30* 12/20/2011   LDLCALC 0 12/20/2011        ALT 24 12/20/2011   AST 35 12/20/2011        NA 139 12/20/2011   K 3.4* 12/20/2011   CL 98 12/20/2011   CREATININE 0.9 12/20/2011   BUN 17 12/20/2011   CO2 29 12/20/2011   TSH 1.08 12/20/2010         Assessment & Plan:

## 2011-12-23 DIAGNOSIS — Z0000 Encounter for general adult medical examination without abnormal findings: Secondary | ICD-10-CM | POA: Insufficient documentation

## 2011-12-23 NOTE — Assessment & Plan Note (Signed)
Followed by Urology - he has been stable.

## 2011-12-23 NOTE — Assessment & Plan Note (Signed)
Lab read out as low HDL with goal of 40+, but LDL was read out as zero! Will need to follow-up with lab inquiry as to this value.

## 2011-12-23 NOTE — Assessment & Plan Note (Signed)
BP Readings from Last 3 Encounters:  12/20/11 160/90  12/19/10 146/90  09/07/10 154/80   Pattern of mild elevation when in the office. He does report lower readings when he check his BP outside of the Dr.'s office.  Plan   continue out of office monitoring and report back if SBP is consistently 140 or higher.

## 2011-12-23 NOTE — Assessment & Plan Note (Signed)
Interval history - no illness, injury or surgery and he feel well. He does work at keeping himself healthy: careful diet, regular exercise. Physical, sans prostate, is normal labs. Results are in normal range, although an LDL of 0 is unusual - will check with the lab on this result. He is current with colorectal cancer screening and will be due a follow-up exam in '14. He is followed by Dr. Isabel Caprice for urology and has been stable. Immunization status - will need to pull paper chart and centricity to locate immunization history.   In summary - a very nice man who appears to be in good health and medically stable. He will continue his present regimen. Next routine visit in 1 year, sooner as needed.

## 2012-01-15 ENCOUNTER — Other Ambulatory Visit: Payer: Self-pay | Admitting: Internal Medicine

## 2012-02-14 ENCOUNTER — Other Ambulatory Visit: Payer: Self-pay | Admitting: Internal Medicine

## 2012-02-14 NOTE — Telephone Encounter (Signed)
REFILL ON RX SENT

## 2012-03-11 ENCOUNTER — Ambulatory Visit: Payer: Medicare Other | Admitting: Internal Medicine

## 2012-03-11 ENCOUNTER — Telehealth: Payer: Self-pay | Admitting: Internal Medicine

## 2012-03-11 NOTE — Telephone Encounter (Signed)
Caller: Trevor Michael/Patient; PCP: [name]; CB#: [phone];  Call regarding Allergies, Sinus Drainage, Feels Weak at times and intermittent Deceased Appetite. Onset: Approx 2 months but worse over 2-3 weeks (July 2-9, 2013), especially worse in AM. Afebrile.  Working and feels good now. Early in AM, phlem is yellow but becomes clear during day.  Occasional dry cough or mild headache.  Advised to see MD within 24 hrs for symptoms do not improve after 14 days of home care per URI Guideline. Appt scheduled for 03/12/12 at 1030 with Dr Debby Bud.

## 2012-03-11 NOTE — Telephone Encounter (Signed)
Caller: Tamon/Patient; PCP: [name]; CB#: [phone]; Call regarding possible sinus infection, allergies acting up all summer, sx worse about 02/18/12, afebrile, cough, yellow drainage.  Pt request appt for 03/12/12.   URI Protocol Utilized.  Pt to call in the am for appt 03/12/12.

## 2012-03-12 ENCOUNTER — Encounter: Payer: Self-pay | Admitting: Internal Medicine

## 2012-03-12 ENCOUNTER — Ambulatory Visit (INDEPENDENT_AMBULATORY_CARE_PROVIDER_SITE_OTHER): Payer: Medicare Other | Admitting: Internal Medicine

## 2012-03-12 VITALS — BP 152/80 | HR 64 | Temp 97.4°F | Resp 16 | Wt 153.0 lb

## 2012-03-12 DIAGNOSIS — J310 Chronic rhinitis: Secondary | ICD-10-CM | POA: Insufficient documentation

## 2012-03-12 NOTE — Assessment & Plan Note (Signed)
Symptoms suggest rhinnitus - no evidence to suggest infection. 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 Plan- try using an over the counter non-sedating antihistamine, e.g. claritin or allegra. Watch for a slowing of the urinary stream or difficulty with urination. A back plan is to use a nasal inhalational steroid spray, e.g. Flonase.

## 2012-03-12 NOTE — Progress Notes (Signed)
  Subjective:    Patient ID: Trevor Michael, male    DOB: Oct 31, 1940, 71 y.o.   MRN: [id number]  HPI Mr. Weisenburger presents for evaluation of AM post-nasal drainage with subsequent cough. He has had no fevers or chills, no SOB and his symptoms resolve after 1-2 hours. He has no h/o allergies. He has no history of pulmonary disease. He is feeling well.   PMH, FamHx and SocHx reviewed for any changes and relevance.  Current Outpatient Prescriptions on File Prior to Visit  Medication Sig Dispense Refill  . amLODipine (NORVASC) 5 MG tablet TAKE 1 TABLET BY MOUTH EVERY DAY  30 tablet  11  . b complex vitamins tablet Take 1 tablet by mouth daily.        . bimatoprost (LUMIGAN) 0.03 % ophthalmic drops Place 1 drop into both eyes at bedtime.        . brimonidine-timolol (COMBIGAN) 0.2-0.5 % ophthalmic solution Place 1 drop into both eyes 2 (two) times daily.        . simvastatin (ZOCOR) 20 MG tablet Take 1 tablet (20 mg total) by mouth at bedtime.  30 tablet  3  . Thiamine HCl (VITAMIN B-1) 100 MG tablet Take 100 mg by mouth daily.            Review of Systems System review is negative for any constitutional, cardiac, pulmonary, GI or neuro symptoms or complaints other than as described in the HPI.     Objective:   Physical Exam Filed Vitals:   03/12/12 1049  BP: 152/80  Pulse: 64  Temp: 97.4 F (36.3 C)  Resp: 16   HEENT- no sinus tenderness Pulm - normal respirations. Cor - RRR       Assessment & Plan:

## 2012-03-12 NOTE — Patient Instructions (Addendum)
Symptoms suggest rhinnitus - no evidence to suggest infection. 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 Plan- try using an over the counter non-sedating antihistamine, e.g. claritin or allegra. Watch for a slowing of the urinary stream or difficulty with urination. A back plan is to use a nasal inhalational steroid spray, e.g. Flonase.  Allergic Rhinitis Allergic rhinitis is when the mucous membranes in the nose respond to allergens. Allergens are particles in the air that cause your body to have an allergic reaction. This causes you to release allergic antibodies. Through a chain of events, these eventually cause you to release histamine into the blood stream (hence the use of antihistamines). Although meant to be protective to the body, it is this release that causes your discomfort, such as frequent sneezing, congestion and an itchy runny nose.   CAUSES   The pollen allergens may come from grasses, trees, and weeds. This is seasonal allergic rhinitis, or "hay fever." Other allergens cause year-round allergic rhinitis (perennial allergic rhinitis) such as house dust mite allergen, pet dander and mold spores.   SYMPTOMS    Nasal stuffiness (congestion).   Runny, itchy nose with sneezing and tearing of the eyes.   There is often an itching of the mouth, eyes and ears.  It cannot be cured, but it can be controlled with medications. DIAGNOSIS   If you are unable to determine the offending allergen, skin or blood testing may find it. TREATMENT    Avoid the allergen.   Medications and allergy shots (immunotherapy) can help.   Hay fever may often be treated with antihistamines in pill or nasal spray forms. Antihistamines block the effects of histamine. There are over-the-counter medicines that may help with nasal congestion and swelling around the eyes. Check with your caregiver before taking or giving this medicine.  If the treatment above  does not work, there are many new medications your caregiver can prescribe. Stronger medications may be used if initial measures are ineffective. Desensitizing injections can be used if medications and avoidance fails. Desensitization is when a patient is given ongoing shots until the body becomes less sensitive to the allergen. Make sure you follow up with your caregiver if problems continue. SEEK MEDICAL CARE IF:    You develop fever (more than 100.5 F (38.1 C).   You develop a cough that does not stop easily (persistent).   You have shortness of breath.   You start wheezing.   Symptoms interfere with normal daily activities.  Document Released: 05/01/2001 Document Revised: 07/26/2011 Document Reviewed: 11/10/2008 [facility] Patient Information 2012 [place], [place].

## 2012-05-05 ENCOUNTER — Other Ambulatory Visit: Payer: Self-pay | Admitting: Internal Medicine

## 2012-05-20 DIAGNOSIS — C801 Malignant (primary) neoplasm, unspecified: Secondary | ICD-10-CM

## 2012-05-20 HISTORY — DX: Malignant (primary) neoplasm, unspecified: C80.1

## 2012-06-18 ENCOUNTER — Encounter (HOSPITAL_COMMUNITY): Payer: Self-pay | Admitting: Emergency Medicine

## 2012-06-18 ENCOUNTER — Emergency Department (HOSPITAL_COMMUNITY)
Admission: EM | Admit: 2012-06-18 | Discharge: 2012-06-18 | Disposition: A | Discharge status: Home / Self Care | Payer: Medicare Other | Source: Home / Self Care | Attending: Family Medicine | Admitting: Family Medicine

## 2012-06-18 ENCOUNTER — Encounter (HOSPITAL_COMMUNITY): Payer: Self-pay

## 2012-06-18 ENCOUNTER — Observation Stay (HOSPITAL_COMMUNITY): Payer: Medicare Other

## 2012-06-18 ENCOUNTER — Inpatient Hospital Stay (HOSPITAL_COMMUNITY)
Admission: EM | Admit: 2012-06-18 | Discharge: 2012-06-21 | DRG: 842 | Disposition: A | Discharge status: Home / Self Care | Payer: Medicare Other | Attending: Internal Medicine | Admitting: Internal Medicine

## 2012-06-18 DIAGNOSIS — N189 Chronic kidney disease, unspecified: Secondary | ICD-10-CM | POA: Diagnosis present

## 2012-06-18 DIAGNOSIS — N400 Enlarged prostate without lower urinary tract symptoms: Secondary | ICD-10-CM | POA: Diagnosis present

## 2012-06-18 DIAGNOSIS — R109 Unspecified abdominal pain: Secondary | ICD-10-CM

## 2012-06-18 DIAGNOSIS — C9000 Multiple myeloma not having achieved remission: Secondary | ICD-10-CM

## 2012-06-18 DIAGNOSIS — N179 Acute kidney failure, unspecified: Secondary | ICD-10-CM

## 2012-06-18 DIAGNOSIS — I129 Hypertensive chronic kidney disease with stage 1 through stage 4 chronic kidney disease, or unspecified chronic kidney disease: Secondary | ICD-10-CM | POA: Diagnosis present

## 2012-06-18 DIAGNOSIS — D696 Thrombocytopenia, unspecified: Secondary | ICD-10-CM | POA: Diagnosis present

## 2012-06-18 DIAGNOSIS — E876 Hypokalemia: Secondary | ICD-10-CM

## 2012-06-18 DIAGNOSIS — D649 Anemia, unspecified: Secondary | ICD-10-CM

## 2012-06-18 DIAGNOSIS — E785 Hyperlipidemia, unspecified: Secondary | ICD-10-CM

## 2012-06-18 DIAGNOSIS — N289 Disorder of kidney and ureter, unspecified: Secondary | ICD-10-CM | POA: Diagnosis present

## 2012-06-18 DIAGNOSIS — H409 Unspecified glaucoma: Secondary | ICD-10-CM | POA: Diagnosis present

## 2012-06-18 DIAGNOSIS — I10 Essential (primary) hypertension: Secondary | ICD-10-CM

## 2012-06-18 HISTORY — DX: Gastro-esophageal reflux disease without esophagitis: K21.9

## 2012-06-18 HISTORY — DX: Cardiac murmur, unspecified: R01.1

## 2012-06-18 HISTORY — DX: Spinal stenosis, cervical region: M48.02

## 2012-06-18 LAB — CBC WITH DIFFERENTIAL/PLATELET
Basophils Absolute: 0 10*3/uL (ref 0.0–0.1)
Eosinophils Absolute: 0 10*3/uL (ref 0.0–0.7)
Eosinophils Relative: 0 % (ref 0–5)
MCH: 31.9 pg (ref 26.0–34.0)
MCV: 95.3 fL (ref 78.0–100.0)
Neutrophils Relative %: 74 % (ref 43–77)
Platelets: 99 10*3/uL — ABNORMAL LOW (ref 150–400)
RDW: 16.2 % — ABNORMAL HIGH (ref 11.5–15.5)
WBC: 8 10*3/uL (ref 4.0–10.5)

## 2012-06-18 LAB — URINALYSIS, ROUTINE W REFLEX MICROSCOPIC
Glucose, UA: NEGATIVE mg/dL
Specific Gravity, Urine: 1.013 (ref 1.005–1.030)
Urobilinogen, UA: 0.2 mg/dL (ref 0.0–1.0)
pH: 6 (ref 5.0–8.0)

## 2012-06-18 LAB — URINE MICROSCOPIC-ADD ON

## 2012-06-18 LAB — COMPREHENSIVE METABOLIC PANEL
ALT: 11 U/L (ref 0–53)
AST: 27 U/L (ref 0–37)
Albumin: 2.9 g/dL — ABNORMAL LOW (ref 3.5–5.2)
Calcium: 9.3 mg/dL (ref 8.4–10.5)
Potassium: 3.3 mEq/L — ABNORMAL LOW (ref 3.5–5.1)
Sodium: 140 mEq/L (ref 135–145)
Total Protein: 9.6 g/dL — ABNORMAL HIGH (ref 6.0–8.3)

## 2012-06-18 LAB — RETICULOCYTES: Retic Count, Absolute: 24.2 10*3/uL (ref 19.0–186.0)

## 2012-06-18 LAB — IRON AND TIBC
Iron: 32 ug/dL — ABNORMAL LOW (ref 42–135)
TIBC: 214 ug/dL — ABNORMAL LOW (ref 215–435)
UIBC: 182 ug/dL (ref 125–400)

## 2012-06-18 LAB — TROPONIN I: Troponin I: <0.3 ng/mL (ref <0.30)

## 2012-06-18 LAB — CK TOTAL AND CKMB (NOT AT ARMC): Relative Index: 1.2 (ref 0.0–2.5)

## 2012-06-18 LAB — LACTATE DEHYDROGENASE: LDH: 178 U/L (ref 94–250)

## 2012-06-18 MED ORDER — IOHEXOL 300 MG/ML  SOLN
20.0000 mL | INTRAMUSCULAR | Status: AC
  Administered 2012-06-18 (×2): 20 mL via ORAL

## 2012-06-18 MED ORDER — TIMOLOL MALEATE 0.5 % OP SOLN
1.0000 [drp] | Freq: Two times a day (BID) | OPHTHALMIC | Status: DC
  Administered 2012-06-18 – 2012-06-21 (×6): 1 [drp] via OPHTHALMIC
  Filled 2012-06-18: qty 5

## 2012-06-18 MED ORDER — BIMATOPROST 0.03 % OP SOLN
1.0000 [drp] | Freq: Every day | OPHTHALMIC | Status: DC
  Filled 2012-06-18: qty 2.5

## 2012-06-18 MED ORDER — AMLODIPINE BESYLATE 5 MG PO TABS
5.0000 mg | ORAL_TABLET | Freq: Every day | ORAL | Status: DC
Start: 2012-06-18 — End: 2012-06-21
  Administered 2012-06-18 – 2012-06-20 (×3): 5 mg via ORAL
  Filled 2012-06-18 (×4): qty 1

## 2012-06-18 MED ORDER — ACETAMINOPHEN 325 MG PO TABS: 650.0000 mg | ORAL_TABLET | Freq: Four times a day (QID) | ORAL | Status: DC | PRN

## 2012-06-18 MED ORDER — ACETAMINOPHEN 650 MG RE SUPP: 650.0000 mg | Freq: Four times a day (QID) | RECTAL | Status: DC | PRN

## 2012-06-18 MED ORDER — LEVALBUTEROL HCL 0.63 MG/3ML IN NEBU
0.6300 mg | INHALATION_SOLUTION | Freq: Four times a day (QID) | RESPIRATORY_TRACT | Status: DC
  Administered 2012-06-19: 0.63 mg via RESPIRATORY_TRACT
  Filled 2012-06-18 (×8): qty 3

## 2012-06-18 MED ORDER — BIMATOPROST 0.01 % OP SOLN
1.0000 [drp] | Freq: Every day | OPHTHALMIC | Status: DC
  Administered 2012-06-19 – 2012-06-20 (×2): 1 [drp] via OPHTHALMIC

## 2012-06-18 MED ORDER — ONDANSETRON HCL 4 MG/2ML IJ SOLN
4.0000 mg | Freq: Three times a day (TID) | INTRAMUSCULAR | Status: DC | PRN
Start: 2012-06-18 — End: 2012-06-18

## 2012-06-18 MED ORDER — SODIUM CHLORIDE 0.9 % IV BOLUS (SEPSIS)
500.0000 mL | Freq: Once | INTRAVENOUS | Status: AC
  Administered 2012-06-18: 500 mL via INTRAVENOUS

## 2012-06-18 MED ORDER — SODIUM CHLORIDE 0.9 % IV BOLUS (SEPSIS)
1000.0000 mL | Freq: Once | INTRAVENOUS | Status: AC
  Administered 2012-06-18: 1000 mL via INTRAVENOUS

## 2012-06-18 MED ORDER — SODIUM CHLORIDE 0.9 % IV SOLN
INTRAVENOUS | Status: AC
  Administered 2012-06-18: 15:00:00 via INTRAVENOUS

## 2012-06-18 MED ORDER — SODIUM CHLORIDE 0.9 % IJ SOLN
3.0000 mL | Freq: Two times a day (BID) | INTRAMUSCULAR | Status: DC
  Administered 2012-06-19 – 2012-06-20 (×2): 3 mL via INTRAVENOUS

## 2012-06-18 MED ORDER — BIMATOPROST 0.01 % OP SOLN
3.0000 [drp] | Freq: Every day | OPHTHALMIC | Status: DC
  Administered 2012-06-18: 1 [drp] via OPHTHALMIC
  Filled 2012-06-18: qty 2.5

## 2012-06-18 MED ORDER — ONDANSETRON 4 MG PO TBDP
4.0000 mg | ORAL_TABLET | Freq: Once | ORAL | Status: AC
  Administered 2012-06-18: 4 mg via ORAL

## 2012-06-18 MED ORDER — ONDANSETRON HCL 4 MG/2ML IJ SOLN: 4.0000 mg | Freq: Four times a day (QID) | INTRAMUSCULAR | Status: DC | PRN

## 2012-06-18 MED ORDER — ONDANSETRON 4 MG PO TBDP
ORAL_TABLET | ORAL | Status: AC
  Filled 2012-06-18: qty 1

## 2012-06-18 MED ORDER — ONDANSETRON HCL 8 MG PO TABS
4.0000 mg | ORAL_TABLET | Freq: Four times a day (QID) | ORAL | Status: DC | PRN
  Filled 2012-06-18: qty 0.5

## 2012-06-18 MED ORDER — POTASSIUM CHLORIDE 20 MEQ/15ML (10%) PO LIQD: 40.0000 meq | Freq: Once | ORAL | Status: DC

## 2012-06-18 MED ORDER — BRIMONIDINE TARTRATE-TIMOLOL 0.2-0.5 % OP SOLN: 1.0000 [drp] | Freq: Two times a day (BID) | OPHTHALMIC | Status: DC

## 2012-06-18 MED ORDER — POTASSIUM CHLORIDE IN NACL 20-0.9 MEQ/L-% IV SOLN
INTRAVENOUS | Status: DC
  Administered 2012-06-18: 22:00:00 via INTRAVENOUS
  Administered 2012-06-19 (×2): 100 mL/h via INTRAVENOUS
  Filled 2012-06-18 (×7): qty 1000

## 2012-06-18 MED ORDER — PANTOPRAZOLE SODIUM 40 MG IV SOLR
40.0000 mg | Freq: Two times a day (BID) | INTRAVENOUS | Status: DC
  Administered 2012-06-18 – 2012-06-20 (×6): 40 mg via INTRAVENOUS
  Filled 2012-06-18 (×8): qty 40

## 2012-06-18 MED ORDER — BRIMONIDINE TARTRATE 0.2 % OP SOLN
1.0000 [drp] | Freq: Two times a day (BID) | OPHTHALMIC | Status: DC
  Administered 2012-06-18 – 2012-06-21 (×6): 1 [drp] via OPHTHALMIC
  Filled 2012-06-18: qty 5

## 2012-06-18 NOTE — ED Notes (Signed)
Pt denies nausea, dizziness, lightheadedness. Pt denies pain but states "dsicomfort."

## 2012-06-18 NOTE — ED Notes (Signed)
Pt returned from CT °

## 2012-06-18 NOTE — ED Notes (Signed)
Pt sent from ucc for furthur eval of epigastric pain and nausea

## 2012-06-18 NOTE — ED Provider Notes (Signed)
History     CSN: [id number]  Arrival date & time 06/18/12  1030   First MD Initiated Contact with Patient 06/18/12 1041      Chief Complaint  Patient presents with  . Abdominal Pain    (Consider location/radiation/quality/duration/timing/severity/associated sxs/prior treatment) HPI   Trevor Michael is a 71 y.o. male with past medical history significant for high cholesterol, hypertension and BPH. Patient is complaining of several days of epigastric discomfort accompanied by nausea with no vomiting. Patient denies any fever, chest pain, shortness of breath, change in bowel or bladder habits. He does report a reduction in by mouth intake secondary to nausea.  Past Medical History  Diagnosis Date  . Other and unspecified hyperlipidemia   . Essential hypertension, benign   . BPH (benign prostatic hyperplasia)   . Early stage glaucoma     Past Surgical History  Procedure Date  . Lipoma excision     left shoulder/scapula    Family History  Problem Relation Age of Onset  . Benign prostatic hyperplasia Father   . Pneumonia Father   . Coronary artery disease Father     PTCA  . Heart disease Brother     CABG, CAD,  . Benign prostatic hyperplasia Brother     History  Substance Use Topics  . Smoking status: Never Smoker   . Smokeless tobacco: Never Used  . Alcohol Use: No      Review of Systems  Constitutional: Negative for fever.  Respiratory: Negative for shortness of breath.   Cardiovascular: Negative for chest pain.  Gastrointestinal: Positive for nausea and abdominal pain. Negative for vomiting and diarrhea.  All other systems reviewed and are negative.    Allergies  Review of patient's allergies indicates no known allergies.  Home Medications   Current Outpatient Rx  Name Route Sig Dispense Refill  . ACETAMINOPHEN 500 MG PO TABS Oral Take 1,000 mg by mouth every 6 (six) hours as needed. For pain    . AMLODIPINE BESYLATE 5 MG PO TABS Oral Take 5 mg by  mouth at bedtime.    . ATENOLOL-CHLORTHALIDONE 50-25 MG PO TABS Oral Take 1 tablet by mouth daily.    . B COMPLEX PO TABS Oral Take 1 tablet by mouth daily.      Marland Kitchen BIMATOPROST 0.03 % OP SOLN Both Eyes Place 1 drop into both eyes at bedtime.      Marland Kitchen BRIMONIDINE TARTRATE-TIMOLOL 0.2-0.5 % OP SOLN Both Eyes Place 1 drop into both eyes 2 (two) times daily.      Marland Kitchen SIMVASTATIN 20 MG PO TABS Oral Take 20 mg by mouth at bedtime.    Marland Kitchen VITAMIN B-1 100 MG PO TABS Oral Take 100 mg by mouth daily.        BP 135/62  Pulse 59  Temp 97.7 F (36.5 C) (Oral)  Resp 20  SpO2 96%  Physical Exam  Nursing note and vitals reviewed. Constitutional: He is oriented to person, place, and time. He appears well-developed and well-nourished. No distress.  HENT:  Head: Normocephalic.  Mouth/Throat: Oropharynx is clear and moist.  Eyes: Conjunctivae normal and EOM are normal. Pupils are equal, round, and reactive to light.  Cardiovascular: Normal rate and intact distal pulses.   Pulmonary/Chest: Effort normal and breath sounds normal. No stridor. No respiratory distress. He has no wheezes. He has no rales. He exhibits no tenderness.  Abdominal: Soft. Bowel sounds are normal. He exhibits no distension and no mass. There is tenderness. There is no  rebound and no guarding.       Very mild tenderness to deep palpation of epigastrum  Musculoskeletal: Normal range of motion.  Neurological: He is alert and oriented to person, place, and time.  Psychiatric: He has a normal mood and affect.    ED Course  Procedures (including critical care time)  Labs Reviewed  CBC WITH DIFFERENTIAL - Abnormal; Notable for the following:    RBC 2.57 (*)     Hemoglobin 8.2 (*)     HCT 24.5 (*)     RDW 16.2 (*)     Platelets 99 (*)  PLATELET COUNT CONFIRMED BY SMEAR   All other components within normal limits  COMPREHENSIVE METABOLIC PANEL - Abnormal; Notable for the following:    Potassium 3.3 (*)     Glucose, Bld 100 (*)     BUN  34 (*)     Creatinine, Ser 3.04 (*)     Total Protein 9.6 (*)     Albumin 2.9 (*)     GFR calc non Af Amer 19 (*)     GFR calc Af Amer 22 (*)     All other components within normal limits  URINALYSIS, ROUTINE W REFLEX MICROSCOPIC - Abnormal; Notable for the following:    Hgb urine dipstick TRACE (*)     Protein, ur 30 (*)     All other components within normal limits  LIPASE, BLOOD  LACTATE DEHYDROGENASE  URINE MICROSCOPIC-ADD ON   No results found.   1. Acute renal failure   2. Anemia   3. Hypokalemia       MDM  Abdominal exam is benign with a very slight tenderness to palpation of the deep epigastrium. I will draw basic blood work.  Patient is found to be in acute renal failure with a creatinine of 3.0 increased from 0.9 x5 months ago.  Patient is mildly hypokalemic at 3.3 he is also anemic to 8.2 down from 11.75 months ago. Guaiac is negative as per urgent care note.  Patient will be admitted admitted to a telemetry bed under the care of Dr. Gerome Apley, [facility] [address] PA-C 06/18/12 1407

## 2012-06-18 NOTE — H&P (Signed)
[facility] History and Physical  Trevor Michael UXL:[id number] DOB: [birth year] DOA: 06/18/2012  Referring physician: Sharin Grave, MD   PCP: [name], MD   Chief Complaint: Abdominal Pain    HPI:  71 year old male with history of hypertension and BPH. Here complaining of 4 days with epigastric tenderness associated with nausea, has had episodes of dry heaving but denies vomiting.Has lost 5 lbs in one month. Denies any hematemesis. Was unaware of being anemic . Denies diaphoresis or chest pain. No shortness of breath. Reports having regular bowel movements of soft stools last time this morning. No melena. No dysuria or hematuria. Patient has a history of BPH and reports urinary frequency but denies retention. Pain worse with eating food. Pain slightly worse with movement although is able to walk straight with minimal discomfort. No fever or chills. Has had a history of "stomach problems "in the past and takes Zantac for this but states he has never felt nauseous before. Denies dizziness. No abdominal belatedness  Has had an EGD about 10 yrs ago, has had sx of dyspepsia for almost 30 yrs . Last colo was 10 yrs ago. DEnies use of ASA, NSAIDS  Also found to have a cr of 3.0 today      Review of Systems: negative for the following  Constitutional: Denies fever, chills, diaphoresis, appetite change and fatigue.  HEENT: Denies photophobia, eye pain, redness, hearing loss, ear pain, congestion, sore throat, rhinorrhea, sneezing, mouth sores, trouble swallowing, neck pain, neck stiffness and tinnitus.  Respiratory: Denies SOB, DOE, cough, chest tightness, and wheezing.  Cardiovascular: Denies chest pain, palpitations and leg swelling.  Gastrointestinal: Denies nausea, vomiting, abdominal pain, diarrhea, constipation, blood in stool and abdominal distention.  Genitourinary: Denies dysuria, urgency, frequency, hematuria, flank pain and difficulty urinating.  Musculoskeletal:  Denies myalgias, back pain, joint swelling, arthralgias and gait problem.  Skin: Denies pallor, rash and wound.  Neurological: Denies dizziness, seizures, syncope, weakness, light-headedness, numbness and headaches.  Hematological: Denies adenopathy. Easy bruising, personal or family bleeding history  Psychiatric/Behavioral: Denies suicidal ideation, mood changes, confusion, nervousness, sleep disturbance and agitation       Past Medical History  Diagnosis Date  . Other and unspecified hyperlipidemia   . Essential hypertension, benign   . BPH (benign prostatic hyperplasia)   . Early stage glaucoma      Past Surgical History  Procedure Date  . Lipoma excision     left shoulder/scapula      Social History:  reports that he has never smoked. He has never used smokeless tobacco. He reports that he does not drink alcohol. His drug history not on file.    No Known Allergies  Family History  Problem Relation Age of Onset  . Benign prostatic hyperplasia Father   . Pneumonia Father   . Coronary artery disease Father     PTCA  . Heart disease Brother     CABG, CAD,  . Benign prostatic hyperplasia Brother      Prior to Admission medications   Medication Sig Start Date End Date Taking? Authorizing Provider  acetaminophen (TYLENOL) 500 MG tablet Take 1,000 mg by mouth every 6 (six) hours as needed. For pain   Yes Historical Provider, MD  amLODipine (NORVASC) 5 MG tablet Take 5 mg by mouth at bedtime.   Yes Historical Provider, MD  atenolol-chlorthalidone (TENORETIC) 50-25 MG per tablet Take 1 tablet by mouth daily.   Yes Historical Provider, MD  b complex vitamins tablet Take 1 tablet by mouth  daily.     Yes Historical Provider, MD  bimatoprost (LUMIGAN) 0.03 % ophthalmic drops Place 1 drop into both eyes at bedtime.     Yes Historical Provider, MD  brimonidine-timolol (COMBIGAN) 0.2-0.5 % ophthalmic solution Place 1 drop into both eyes 2 (two) times daily.     Yes Historical  Provider, MD  simvastatin (ZOCOR) 20 MG tablet Take 20 mg by mouth at bedtime.   Yes Historical Provider, MD  Thiamine HCl (VITAMIN B-1) 100 MG tablet Take 100 mg by mouth daily.     Yes Historical Provider, MD     Physical Exam: Filed Vitals:   06/18/12 1230 06/18/12 1257 06/18/12 1409 06/18/12 1543  BP: 135/62 142/63 149/63 143/62  Pulse: 59 60 62 63  Temp:  98.3 F (36.8 C) 98.4 F (36.9 C)   TempSrc:  Oral Oral   Resp:  16 16 16   SpO2: 96% 95% 98% 96%      Constitutional: He is oriented to person, place, and time. He appears well-developed and well-nourished. No distress.  HENT:  Head: Normocephalic and atraumatic.  Mouth/Throat: Oropharynx is clear and moist. No oropharyngeal exudate.  Eyes: Conjunctivae normal are normal. No scleral icterus.  Neck: Neck supple. No JVD present. No thyromegaly present.  Cardiovascular: Normal rate, regular rhythm and normal heart sounds.  Pulmonary/Chest: Effort normal and breath sounds normal. No respiratory distress. He has no wheezes. He has no rales. He exhibits no tenderness.  Abdominal: Soft. Bowel sounds are normal. He exhibits no distension and no mass. There is tenderness. There is no rebound and no guarding.  Patient is thin with a flat abdomen, no significant epigastric tenderness. Patient does report supraumbilical tenderness in the Center of abdomen. No guarding, no rigidity. No masses. Aorta pulsations are palpable with no obvious pulsatile mass. Negative Murphy sign. No hepatic or splenomegaly. No rebound. Genitourinary: Guaiac negative stool.  Enlarged nontender prostate.  Lymphadenopathy:  He has no cervical adenopathy.  Neurological: He is alert and oriented to person, place, and time.  Skin: No rash noted. He is not diaphoretic.        Labs on Admission:    Basic Metabolic Panel:  Lab 06/18/12 1610  NA 140  K 3.3*  CL 101  CO2 25  GLUCOSE 100*  BUN 34*  CREATININE 3.04*  CALCIUM 9.3  MG --  PHOS --    Liver Function Tests:  Lab 06/18/12 1138  AST 27  ALT 11  ALKPHOS 49  BILITOT 0.4  PROT 9.6*  ALBUMIN 2.9*    Lab 06/18/12 1138  LIPASE 15  AMYLASE --   No results found for this basename: AMMONIA:5 in the last 168 hours CBC:  Lab 06/18/12 1138  WBC 8.0  NEUTROABS 5.9  HGB 8.2*  HCT 24.5*  MCV 95.3  PLT 99*   Cardiac Enzymes:  Lab 06/18/12 1438  CKTOTAL 104  CKMB 1.2  CKMBINDEX --  TROPONINI <0.30    BNP (last 3 results) No results found for this basename: PROBNP:3 in the last 8760 hours    CBG: No results found for this basename: GLUCAP:5 in the last 168 hours  Radiological Exams on Admission: Ct Abdomen Pelvis Wo Contrast  06/18/2012  *RADIOLOGY REPORT*  Clinical Data: Epigastric pain, rule out colitis  CT ABDOMEN AND PELVIS WITHOUT CONTRAST  Technique:  Multidetector CT imaging of the abdomen and pelvis was performed following the standard protocol without intravenous contrast.  Comparison: None.  Findings: Sagittal images of the spine shows a  tiny lytic lesion T10 vertebral body.  There is a lytic lesion in the T11 vertebral body right posteriorly measures 1.2 cm.  A subtle small lytic lesion L1 vertebral body measures 6 mm.  There is a lytic lesion in the right iliac bone measures 1.2 cm.  A lytic destructive lesion in the right iliac bone anteriorly axial image 57 measures 3 cm. There is a lytic lesion in the left iliac bone at the level of the SI joint measures 9.6 mm.  Findings are highly suspicious for metastatic disease. Further evaluation with bone scan or MRI is recommended.  Lung bases are unremarkable.  Unenhanced liver is unremarkable. 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  Cystic lesion in the upper pole of the right kidney measures 6.5 cm probable a cyst. There is a probable cyst in the mid pole of the left kidney measures 5 cm.  Additional smaller low density lesions within the right kidney cannot be characterized without IV contrast.  No  nephrolithiasis.  No hydronephrosis or hydroureter.  No calcified ureteral calculi are noted.  No aortic aneurysm.  Mild atherosclerotic calcification of  proximal common iliac arteries.  Oral contrast material was given to the patient.  No small bowel obstruction.  No ascites or free air.  No adenopathy.  No pericecal inflammation.  Normal appendix is clearly visualized in axial image 55.  A distended urinary bladder is noted.  There is enlarged prostate gland with indentation of urinary bladder base.  Prostate gland measures 4.9 x 5.6 cm.  Further evaluation with urology exam is recommended.  Bilateral distal ureter is unremarkable.  Left pelvic phleboliths are noted.  Lytic lesions are noted bilateral proximal femur  A small right hydrocele is noted.  Small amount of pelvic free fluid noted within posterior pelvis.  No evidence of colitis or diverticulitis.   IMPRESSION:  1.  There are multiple lytic bony lesions as described above highly suspicious for metastatic disease.  The largest lesion with some cortical destruction in the anterior aspect right iliac bone measures 3 cm.  Further evaluation with bone scan or MRI is recommended.  2.  No evidence of colitis or diverticulitis. 3.  No hydronephrosis or hydroureter.  Bilateral probable renal cyst.  Smaller low density lesions within the right kidney cannot be characterized without IV contrast. 4.  No aortic aneurysm. 5.  No small bowel or colonic obstruction.  Normal appendix. 6.  There is enlarged prostate gland with indentation of urinary bladder base.  Correlation with urology exam is recommended.  A distended urinary bladder is noted. 7.  Small right hydrocele.   Original Report Authenticated By: Natasha Mead, M.D.     EKG: Independently reviewed.   Assessment/Plan Principal Problem:  *Abdominal  pain, other specified site Active Problems:  HYPERLIPIDEMIA, MILD  HYPERTENSION, MILD  BENIGN PROSTATIC HYPERTROPHY   1. Abdominal pain , in setting of  anemia , long standing hx of dyspepsia , will check CT scan abdomen , GI consult called to DR Loreta Ave, will prep for EGD tomorrow , consult was requested for tomorrow , not urgent  2. Multiple lytic lesions , anemia /thrombocytopenia, primary bone marrow issue ? Will do a bone scan , patient may need a PET ct to look for primary if low suspicion for multiple myeloma,will order spep, upep 3. HTN stable   Code Status:   full Family Communication: bedside Disposition Plan: admit   Time spent: 70 mins   [facility] Pager [phone]  If 7PM-7AM,  please contact night-coverage [email or web address] Password [email or web address] 06/18/2012, 4:42 PM

## 2012-06-18 NOTE — ED Provider Notes (Signed)
Medical screening examination/treatment/procedure(s) were performed by non-physician practitioner and as supervising physician I was immediately available for consultation/collaboration.  Anaid Haney R. Amberrose Friebel, MD 06/18/12 1645 

## 2012-06-18 NOTE — ED Notes (Signed)
Pt states when finished omnipaque there was some "discomfort" but no pain

## 2012-06-18 NOTE — ED Notes (Signed)
C/o abdominal pain and nausea since Sunday

## 2012-06-18 NOTE — ED Notes (Signed)
Pt denies nausea; denies pain; denies dizziness and lightheadedness. Pt states discomfort is gradually "better" now.

## 2012-06-18 NOTE — ED Notes (Signed)
Pt denies pain, nausea, lightheadedness and dizziness.

## 2012-06-18 NOTE — ED Notes (Signed)
Report given to floor nurse Mindy,RN. Nurse had no further questions upon report given. Pt transported to floor.

## 2012-06-18 NOTE — ED Notes (Signed)
CT called and informed pt finished omnipaque solution.

## 2012-06-18 NOTE — ED Provider Notes (Signed)
History     CSN: [id number]  Arrival date & time 06/18/12  6213   First MD Initiated Contact with Patient 06/18/12 [phone]      Chief Complaint  Patient presents with  . Abdominal Pain    (Consider location/radiation/quality/duration/timing/severity/associated sxs/prior treatment) HPI Comments: 71 year old male with history of hypertension and BPH. Here complaining of 4 days with epigastric tenderness associated with nausea, has had episodes of dry heaving but denies vomiting. Denies diaphoresis or chest pain. No shortness of breath. Reports having regular bowel movements of soft stools last time this morning. No melena. No dysuria or hematuria. Patient has a history of BPH and reports urinary frequency but denies retention. Pain worse with eating food. Pain slightly worse with movement although is able to walk straight with minimal discomfort. No fever or chills. Has had a history of "stomach problems "in the past and takes Zantac for this but states he has never felt nauseous before. Denies dizziness. No abdominal belatedness.   Past Medical History  Diagnosis Date  . Other and unspecified hyperlipidemia   . Essential hypertension, benign   . BPH (benign prostatic hyperplasia)   . Early stage glaucoma     Past Surgical History  Procedure Date  . Lipoma excision     left shoulder/scapula    Family History  Problem Relation Age of Onset  . Benign prostatic hyperplasia Father   . Pneumonia Father   . Coronary artery disease Father     PTCA  . Heart disease Brother     CABG, CAD,  . Benign prostatic hyperplasia Brother     History  Substance Use Topics  . Smoking status: Never Smoker   . Smokeless tobacco: Never Used  . Alcohol Use: No      Review of Systems  Constitutional: Positive for appetite change. Negative for fever, chills and diaphoresis.  HENT: Negative for congestion and sore throat.   Respiratory: Negative for cough and shortness of breath.     Cardiovascular: Negative for chest pain, palpitations and leg swelling.  Gastrointestinal: Positive for nausea and abdominal pain. Negative for vomiting, diarrhea, constipation, blood in stool and abdominal distention.  Genitourinary: Positive for frequency. Negative for dysuria, urgency, hematuria, flank pain, decreased urine volume and testicular pain.  Musculoskeletal: Negative for myalgias and arthralgias.  Skin: Negative for rash.  Neurological: Negative for dizziness, tremors and headaches.  All other systems reviewed and are negative.    Allergies  Review of patient's allergies indicates no known allergies.  Home Medications   Current Outpatient Rx  Name Route Sig Dispense Refill  . AMLODIPINE BESYLATE 5 MG PO TABS  TAKE 1 TABLET BY MOUTH EVERY DAY 30 tablet 11  . ATENOLOL-CHLORTHALIDONE 50-25 MG PO TABS Oral Take 1 tablet by mouth daily.    . B COMPLEX PO TABS Oral Take 1 tablet by mouth daily.      Marland Kitchen BIMATOPROST 0.03 % OP SOLN Both Eyes Place 1 drop into both eyes at bedtime.      Marland Kitchen BRIMONIDINE TARTRATE-TIMOLOL 0.2-0.5 % OP SOLN Both Eyes Place 1 drop into both eyes 2 (two) times daily.      Marland Kitchen SIMVASTATIN 20 MG PO TABS  TAKE 1 TABLET (20 MG TOTAL) BY MOUTH AT BEDTIME. 30 tablet 3  . VITAMIN B-1 100 MG PO TABS Oral Take 100 mg by mouth daily.        BP 161/68  Pulse 57  Temp 97.7 F (36.5 C) (Oral)  Resp 18  SpO2 100%  Physical Exam  Nursing note and vitals reviewed. Constitutional: He is oriented to person, place, and time. He appears well-developed and well-nourished. No distress.  HENT:  Head: Normocephalic and atraumatic.  Mouth/Throat: Oropharynx is clear and moist. No oropharyngeal exudate.  Eyes: Conjunctivae normal are normal. No scleral icterus.  Neck: Neck supple. No JVD present. No thyromegaly present.  Cardiovascular: Normal rate, regular rhythm and normal heart sounds.   Pulmonary/Chest: Effort normal and breath sounds normal. No respiratory distress.  He has no wheezes. He has no rales. He exhibits no tenderness.  Abdominal: Soft. Bowel sounds are normal. He exhibits no distension and no mass. There is tenderness. There is no rebound and no guarding.       Patient is thin with a flat abdomen, no significant epigastric tenderness. Patient does report supraumbilical tenderness in the Center of abdomen. No guarding, no rigidity. No masses. Aorta pulsations are palpable with no obvious pulsatile mass. Negative Murphy sign. No hepatic or splenomegaly. No rebound.     Genitourinary: Guaiac negative stool.       Enlarged nontender prostate.  Lymphadenopathy:    He has no cervical adenopathy.  Neurological: He is alert and oriented to person, place, and time.  Skin: No rash noted. He is not diaphoretic.    ED Course  Procedures (including critical care time)  Labs Reviewed - No data to display No results found.   1. Abdominal pain       MDM  71 year old male with history of hypertension and BPH. Here complaining of 4 days with epigastric tenderness associated with nausea.On exam abdomen is soft, normoactive bowel sounds. Negative Murphy. No significant epigastric tenderness, patient does have periumbilical tenderness to deep palpation. Patient is thin, has flat abdomen and aorta pulsations can be easily palpable but I do not feel a mass and don't hear an abdominal murmur. guaiac test is negative for blood. EKG with normal sinus rhythm, ventricular rate 60 beats per minute,  prolonged QT interval, large R/S waves in lateral leads suggestive of left atria and ventricle hypertrophy. No ST changes or other acute ischemic changes. Impress possible gastritis although decided to transfer patient to ED as he might require further imaging testing to better or characterize his abdominal pain. Patient received ondansetron 4 mg sublingual prior to transfer to the emergency department.  Vitals signs are stable and patient was transferred via shuttle. No  other testing pending at time of transfer.    Sharin Grave, MD 06/18/12 1434

## 2012-06-19 ENCOUNTER — Observation Stay (HOSPITAL_COMMUNITY): Payer: Medicare Other

## 2012-06-19 DIAGNOSIS — N179 Acute kidney failure, unspecified: Secondary | ICD-10-CM

## 2012-06-19 DIAGNOSIS — D649 Anemia, unspecified: Secondary | ICD-10-CM

## 2012-06-19 DIAGNOSIS — N19 Unspecified kidney failure: Secondary | ICD-10-CM

## 2012-06-19 LAB — CBC
Platelets: 87 10*3/uL — ABNORMAL LOW (ref 150–400)
RBC: 2.46 MIL/uL — ABNORMAL LOW (ref 4.22–5.81)
RDW: 16.5 % — ABNORMAL HIGH (ref 11.5–15.5)

## 2012-06-19 LAB — URIC ACID: Uric Acid, Serum: 10.8 mg/dL — ABNORMAL HIGH (ref 4.0–7.8)

## 2012-06-19 LAB — BASIC METABOLIC PANEL
BUN: 30 mg/dL — ABNORMAL HIGH (ref 6–23)
CO2: 20 mEq/L (ref 19–32)
Glucose, Bld: 85 mg/dL (ref 70–99)
Potassium: 3.4 mEq/L — ABNORMAL LOW (ref 3.5–5.1)
Sodium: 138 mEq/L (ref 135–145)

## 2012-06-19 LAB — LACTATE DEHYDROGENASE: LDH: 198 U/L (ref 94–250)

## 2012-06-19 LAB — FOLATE: Folate: 18.1 ng/mL

## 2012-06-19 LAB — SAVE SMEAR

## 2012-06-19 LAB — FERRITIN: Ferritin: 308 ng/mL (ref 22–322)

## 2012-06-19 MED ORDER — FERUMOXYTOL INJECTION 510 MG/17 ML
510.0000 mg | Freq: Once | INTRAVENOUS | Status: AC
  Administered 2012-06-19: 510 mg via INTRAVENOUS
  Filled 2012-06-19: qty 17

## 2012-06-19 MED ORDER — TECHNETIUM TC 99M MEDRONATE IV KIT
25.0000 | PACK | Freq: Once | INTRAVENOUS | Status: AC | PRN
  Administered 2012-06-19: 25 via INTRAVENOUS

## 2012-06-19 MED ORDER — ALLOPURINOL 100 MG PO TABS
100.0000 mg | ORAL_TABLET | Freq: Every day | ORAL | Status: DC
  Administered 2012-06-19 – 2012-06-21 (×3): 100 mg via ORAL
  Filled 2012-06-19 (×3): qty 1

## 2012-06-19 MED ORDER — LEVALBUTEROL HCL 0.63 MG/3ML IN NEBU
0.6300 mg | INHALATION_SOLUTION | Freq: Four times a day (QID) | RESPIRATORY_TRACT | Status: DC | PRN
  Filled 2012-06-19: qty 3

## 2012-06-19 NOTE — Progress Notes (Signed)
PM note - reviewed all consult notes and new studies.  CT guided biopsy is the priority over EGD.   Patient aware of results and plans.

## 2012-06-19 NOTE — Progress Notes (Addendum)
INITIAL ADULT NUTRITION ASSESSMENT Date: 06/19/2012   Time: 10:00 AM  Reason for Assessment: Malnutrition Screening  INTERVENTION: 1.  Provided education on High-Calorie and High Protein diet education. 2. Recommend adding Ensure Complete once diet allows 3. RD to continue to follow nutrition care plan  DOCUMENTATION CODES Per approved criteria  -Not Applicable   ASSESSMENT: Male 71 y.o.  Dx: Abdominal  pain, other specified site  Hx:  Past Medical History  Diagnosis Date  . Other and unspecified hyperlipidemia   . Essential hypertension, benign   . BPH (benign prostatic hyperplasia)   . Early stage glaucoma   . Heart murmur     "since my teens" (06/18/2012)  . Cervical stenosis of spine   . GERD (gastroesophageal reflux disease)    Past Surgical History  Procedure Date  . Lipoma excision 07/1986    left shoulder/scapula   Related Meds:     . sodium chloride   Intravenous STAT  . amLODipine  5 mg Oral QHS  . bimatoprost  1 drop Both Eyes QHS  . brimonidine  1 drop Both Eyes BID   And  . timolol  1 drop Both Eyes BID  . iohexol  20 mL Oral Q1 Hr x 2  . levalbuterol  0.63 mg Nebulization Q6H  . pantoprazole (PROTONIX) IV  40 mg Intravenous Q12H  . sodium chloride  1,000 mL Intravenous Once  . sodium chloride  500 mL Intravenous Once  . sodium chloride  500 mL Intravenous Once  . sodium chloride  3 mL Intravenous Q12H  . DISCONTD: bimatoprost  3 drop Both Eyes QHS  . DISCONTD: bimatoprost  1 drop Both Eyes QHS  . DISCONTD: brimonidine-timolol  1 drop Both Eyes BID  . DISCONTD: potassium chloride  40 mEq Oral Once   Ht: 5\' 9"  (175.3 cm)  Wt: 153 lb 6.4 oz (69.582 kg)  Ideal Wt: 160 lb/72.7 kg % Ideal Wt: 96%  Wt Readings from Last 15 Encounters:  06/18/12 153 lb 6.4 oz (69.582 kg)  03/12/12 153 lb (69.4 kg)  12/20/11 157 lb (71.215 kg)  12/19/10 158 lb (71.668 kg)  09/07/10 162 lb (73.483 kg)  11/16/09 163 lb (73.936 kg)  11/15/08 167 lb 8 oz  (75.978 kg)  05/10/08 164 lb (74.39 kg)  11/12/07 164 lb (74.39 kg)  10/22/06 167 lb (75.751 kg)  Usual Wt: 160-163 lb % Usual Wt: 94%; 6% wt loss x 3 months per patient  Body mass index is 22.65 kg/(m^2). Wt is WNL.  Food/Nutrition Related Hx: appetite trending down x 1 year, more drastic decrease in PO intake x 2-3 months  Labs:  CMP     Component Value Date/Time   NA 138 06/19/2012 0505   K 3.4* 06/19/2012 0505   CL 104 06/19/2012 0505   CO2 20 06/19/2012 0505   GLUCOSE 85 06/19/2012 0505   BUN 30* 06/19/2012 0505   CREATININE 3.06* 06/19/2012 0505   CALCIUM 8.6 06/19/2012 0505   PROT 9.6* 06/18/2012 1138   ALBUMIN 2.9* 06/18/2012 1138   AST 27 06/18/2012 1138   ALT 11 06/18/2012 1138   ALKPHOS 49 06/18/2012 1138   BILITOT 0.4 06/18/2012 1138   GFRNONAA 19* 06/19/2012 0505   GFRAA 22* 06/19/2012 0505   Sodium  Date/Time Value Range Status  06/19/2012  5:05 AM 138  135 - 145 mEq/L Final  06/18/2012 11:38 AM 140  135 - 145 mEq/L Final  12/20/2011  2:49 PM 139  135 - 145 mEq/L Final  Potassium  Date/Time Value Range Status  06/19/2012  5:05 AM 3.4* 3.5 - 5.1 mEq/L Final  06/18/2012 11:38 AM 3.3* 3.5 - 5.1 mEq/L Final  12/20/2011  2:49 PM 3.4* 3.5 - 5.1 mEq/L Final    No results found for this basename: phos    No results found for this basename: mg   Diet Order: Clear Liquid  Supplements/Tube Feeding: none  IVF:    0.9 % NaCl with KCl 20 mEq / L Last Rate: 100 mL/hr (06/19/12 0752)    Estimated Nutritional Needs:   Kcal:  1700 - 1900 kcal Protein:  75 - 85 grams protein Fluid:  1.7 - 1.9 liters daily  Admitted with abdominal pain x 4 days, nausea with no vomiting. Pain worse with eating. Hx of dyspepsia. GI consulted. CT with multiple lytic lesions to bone suggestive of myeloma. Noted new onset of renal failure without hx of prior disease. Concern for myeloma of kidney.  Pt is currently on a clear liquid diet. States that he is tolerating a clear  liquid diet at this time.  BM on 10/30.  Pt asking for high-calorie, high-protein diet education, this RD reviewed handout with patient and provided specific examples on ways to increase intake of foods throughout the day.  Pt is at nutrition risk for recent decline in weight. Noted wt loss is not significant at this time.  NUTRITION DIAGNOSIS: Unintentional wt loss r/t variable appetite and ?myeloma AEB wt loss of 6% x 2-3 months.  MONITORING/EVALUATION(Goals): Goal: Advance diet as tolerated. Pt to meet >/= 90% of their estimated nutrition needs Monitor: weight trends, lab trends, I/O's, PO intake, supplement tolerance  EDUCATION NEEDS: -No education needs identified at this time    Jarold Motto MS, RD, LDN Pager: [phone] After-hours pager: [phone]

## 2012-06-19 NOTE — Progress Notes (Signed)
Subjective: [name] was admitted for evaluation of acute anemia and renal insufficiency. All ED and admit notes reviewed: several days of mild abdominal discomfort and not feeling well. Urgent Care then ED: new on-set anemia w/ Hgb 8.2 and renal failure w/ Cr 3.04. CT with multiple lytic lesions to bone. He has previously enjoyed good health. Has had colonoscopy '04 that was normal ( Dr. Leone Payor did procedure but patient has clearly stated he wants Dr. Loreta Ave to be his GI consultant). Has been followed by Dr. Isabel Caprice for elevated PSA.  [name] is feeling relatively well. He has had a BM that was loose - done after oral contrast for CT, but not black and no blood or mucus. He has had no nausea. No other signs of GI bleed over the past several days. He has been taking H2 blocker for dyspepsia long term.  Objective: Lab: Lab Results  Component Value Date   WBC 6.4 06/19/2012   HGB 7.8* 06/19/2012   HCT 23.2* 06/19/2012   MCV 94.3 06/19/2012   PLT 87* 06/19/2012   BMET    Component Value Date/Time   NA 138 06/19/2012 0505   K 3.4* 06/19/2012 0505   CL 104 06/19/2012 0505   CO2 20 06/19/2012 0505   GLUCOSE 85 06/19/2012 0505   BUN 30* 06/19/2012 0505   CREATININE 3.06* 06/19/2012 0505   CALCIUM 8.6 06/19/2012 0505   GFRNONAA 19* 06/19/2012 0505   GFRAA 22* 06/19/2012 0505     Imaging: CT reviewed.  Scheduled Meds:   . sodium chloride   Intravenous STAT  . amLODipine  5 mg Oral QHS  . bimatoprost  1 drop Both Eyes QHS  . brimonidine  1 drop Both Eyes BID   And  . timolol  1 drop Both Eyes BID  . iohexol  20 mL Oral Q1 Hr x 2  . levalbuterol  0.63 mg Nebulization Q6H  . pantoprazole (PROTONIX) IV  40 mg Intravenous Q12H  . sodium chloride  1,000 mL Intravenous Once  . sodium chloride  500 mL Intravenous Once  . sodium chloride  500 mL Intravenous Once  . sodium chloride  3 mL Intravenous Q12H  . DISCONTD: bimatoprost  3 drop Both Eyes QHS  . DISCONTD: bimatoprost  1 drop  Both Eyes QHS  . DISCONTD: brimonidine-timolol  1 drop Both Eyes BID  . DISCONTD: potassium chloride  40 mEq Oral Once   Continuous Infusions:   . 0.9 % NaCl with KCl 20 mEq / L 100 mL/hr at 06/18/12 2155   PRN Meds:.acetaminophen, acetaminophen, ondansetron (ZOFRAN) IV, ondansetron, DISCONTD: ondansetron (ZOFRAN) IV   Physical Exam: Filed Vitals:   06/19/12 0532  BP: 138/55  Pulse: 62  Temp: 99 F (37.2 C)  Resp: 16   No intake or output data in the 24 hours ending 06/19/12 0726  Gen'l - WNWD AA man in no distress. HEENT- C&S clear, no icterus Nodes - negative Cor- RRR Pulm - normal respirations, lungs CTAP Abd - BS +, no HSM, no guarding or rebound Rectal - deferred. Not donein ED or by admitting MD Neuor - A&O x 3, CN II- XII grossly normal     Assessment/Plan: 1. Anemia - new on-set anemia from baseline of 11g to 7.8 g. No evidence of active bleed. Colonoscopy '04 was normal. High probability of hematologic disorder.  Plan GI consult - Dr. Loreta Ave  Serial H/H - next this PM  2. Renal failure - new onset renal failure w/o any  prior h/o disease. He has mild hypertension that has been controlled. NO DM. No evidence of dehydration. Concern for myeloma kidney. SPEP, UPEP and serum light chains ordered.  Plan  Renal consult requested  3. Hematology - new on-set of anemia, renal failure, lytic bone lesions suggestive of myeloma.  Plan Bone scan ordered  Hematology consult  Greater than 30 minutes reviewing case, talking with patient managing care  [name] [facility] (o) [phone]; (c) [phone] Call-grp - [facility] Tele: [phone]  06/19/2012, 7:03 AM

## 2012-06-19 NOTE — Consult Note (Signed)
Reason for Consult: Epigastric discomfort Referring Physician: Oliver Barre, M.D.  [name] HPI: This is a 71 year old male admitted for abdominal discomfort, anemia, and renal insufficiency.  The patient states that he started feel unwell this past Friday.  He lost his appetite and then he started to have a mid abdominal discomfort.  No overt pain, but it was a new sensation for him despite his long history of a "sensitive stomach".  During the initial evaluation he was identified to have normocytic anemia and there were lytic bone lesions on imaging.  Hematology was consulted to evaluate him for Multiple Myeloma as well as Nephrology for his renal insufficiency.  His HGB dropped from the 11 range down to the 7 range over the past 1.5 years.  No reports of hematochezia or melena and in 2004 he had a normal screening colonoscopy by Dr. Leone Payor.  FOBT was negative for blood.    Past Medical History  Diagnosis Date  . Other and unspecified hyperlipidemia   . Essential hypertension, benign   . BPH (benign prostatic hyperplasia)   . Early stage glaucoma   . Heart murmur     "since my teens" (06/18/2012)  . Cervical stenosis of spine   . GERD (gastroesophageal reflux disease)     Past Surgical History  Procedure Date  . Lipoma excision 07/1986    left shoulder/scapula    Family History  Problem Relation Age of Onset  . Benign prostatic hyperplasia Father   . Pneumonia Father   . Coronary artery disease Father     PTCA  . Heart disease Brother     CABG, CAD,  . Benign prostatic hyperplasia Brother     Social History:  reports that he has never smoked. He has never used smokeless tobacco. He reports that he does not drink alcohol or use illicit drugs.  Allergies: No Known Allergies  Medications:  Scheduled:   . sodium chloride   Intravenous STAT  . amLODipine  5 mg Oral QHS  . bimatoprost  1 drop Both Eyes QHS  . brimonidine  1 drop Both Eyes BID   And  . timolol  1  drop Both Eyes BID  . ferumoxytol  510 mg Intravenous Once  . iohexol  20 mL Oral Q1 Hr x 2  . levalbuterol  0.63 mg Nebulization Q6H  . pantoprazole (PROTONIX) IV  40 mg Intravenous Q12H  . sodium chloride  1,000 mL Intravenous Once  . sodium chloride  3 mL Intravenous Q12H  . DISCONTD: bimatoprost  3 drop Both Eyes QHS  . DISCONTD: bimatoprost  1 drop Both Eyes QHS  . DISCONTD: brimonidine-timolol  1 drop Both Eyes BID   Continuous:   . 0.9 % NaCl with KCl 20 mEq / L 100 mL/hr (06/19/12 0752)    Results for orders placed during the hospital encounter of 06/18/12 (from the past 24 hour(s))  TROPONIN I     Status: Normal   Collection Time   06/18/12  2:38 PM      Component Value Range   Troponin I <0.30  <0.30 ng/mL  CK TOTAL AND CKMB     Status: Normal   Collection Time   06/18/12  2:38 PM      Component Value Range   Total CK 104  7 - 232 U/L   CK, MB 1.2  0.3 - 4.0 ng/mL   Relative Index 1.2  0.0 - 2.5  VITAMIN B12     Status:  Abnormal   Collection Time   06/18/12  2:41 PM      Component Value Range   Vitamin B-12 1315 (*) 211 - 911 pg/mL  IRON AND TIBC     Status: Abnormal   Collection Time   06/18/12  2:41 PM      Component Value Range   Iron 32 (*) 42 - 135 ug/dL   TIBC 161 (*) 096 - 045 ug/dL   Saturation Ratios 15 (*) 20 - 55 %   UIBC 182  125 - 400 ug/dL  FERRITIN     Status: Normal   Collection Time   06/18/12  2:41 PM      Component Value Range   Ferritin 308  22 - 322 ng/mL  RETICULOCYTES     Status: Abnormal   Collection Time   06/18/12  2:41 PM      Component Value Range   Retic Ct Pct 1.0  0.4 - 3.1 %   RBC. 2.42 (*) 4.22 - 5.81 MIL/uL   Retic Count, Manual 24.2  19.0 - 186.0 K/uL  BASIC METABOLIC PANEL     Status: Abnormal   Collection Time   06/19/12  5:05 AM      Component Value Range   Sodium 138  135 - 145 mEq/L   Potassium 3.4 (*) 3.5 - 5.1 mEq/L   Chloride 104  96 - 112 mEq/L   CO2 20  19 - 32 mEq/L   Glucose, Bld 85  70 - 99 mg/dL     BUN 30 (*) 6 - 23 mg/dL   Creatinine, Ser 4.09 (*) 0.50 - 1.35 mg/dL   Calcium 8.6  8.4 - 81.1 mg/dL   GFR calc non Af Amer 19 (*) >90 mL/min   GFR calc Af Amer 22 (*) >90 mL/min  CBC     Status: Abnormal   Collection Time   06/19/12  5:05 AM      Component Value Range   WBC 6.4  4.0 - 10.5 K/uL   RBC 2.46 (*) 4.22 - 5.81 MIL/uL   Hemoglobin 7.8 (*) 13.0 - 17.0 g/dL   HCT 91.4 (*) 78.2 - 95.6 %   MCV 94.3  78.0 - 100.0 fL   MCH 31.7  26.0 - 34.0 pg   MCHC 33.6  30.0 - 36.0 g/dL   RDW 21.3 (*) 08.6 - 57.8 %   Platelets 87 (*) 150 - 400 K/uL  OCCULT BLOOD X 1 CARD TO LAB, STOOL     Status: Normal   Collection Time   06/19/12  6:38 AM      Component Value Range   Fecal Occult Bld NEGATIVE    SAVE SMEAR     Status: Normal   Collection Time   06/19/12 10:54 AM      Component Value Range   Smear Review SMEAR STAINED AND AVAILABLE FOR REVIEW    LACTATE DEHYDROGENASE     Status: Normal   Collection Time   06/19/12 10:54 AM      Component Value Range   LDH 198  94 - 250 U/L  URIC ACID     Status: Abnormal   Collection Time   06/19/12 10:54 AM      Component Value Range   Uric Acid, Serum 10.8 (*) 4.0 - 7.8 mg/dL     Ct Abdomen Pelvis Wo Contrast  06/18/2012  *RADIOLOGY REPORT*  Clinical Data: Epigastric pain, rule out colitis  CT ABDOMEN AND PELVIS WITHOUT CONTRAST  Technique:  Multidetector CT imaging of the abdomen and pelvis was performed following the standard protocol without intravenous contrast.  Comparison: None.  Findings: Sagittal images of the spine shows a tiny lytic lesion T10 vertebral body.  There is a lytic lesion in the T11 vertebral body right posteriorly measures 1.2 cm.  A subtle small lytic lesion L1 vertebral body measures 6 mm.  There is a lytic lesion in the right iliac bone measures 1.2 cm.  A lytic destructive lesion in the right iliac bone anteriorly axial image 57 measures 3 cm. There is a lytic lesion in the left iliac bone at the level of the SI joint  measures 9.6 mm.  Findings are highly suspicious for metastatic disease. Further evaluation with bone scan or MRI is recommended.  Lung bases are unremarkable.  Unenhanced liver is unremarkable. 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  Cystic lesion in the upper pole of the right kidney measures 6.5 cm probable a cyst. There is a probable cyst in the mid pole of the left kidney measures 5 cm.  Additional smaller low density lesions within the right kidney cannot be characterized without IV contrast.  No nephrolithiasis.  No hydronephrosis or hydroureter.  No calcified ureteral calculi are noted.  No aortic aneurysm.  Mild atherosclerotic calcification of  proximal common iliac arteries.  Oral contrast material was given to the patient.  No small bowel obstruction.  No ascites or free air.  No adenopathy.  No pericecal inflammation.  Normal appendix is clearly visualized in axial image 55.  A distended urinary bladder is noted.  There is enlarged prostate gland with indentation of urinary bladder base.  Prostate gland measures 4.9 x 5.6 cm.  Further evaluation with urology exam is recommended.  Bilateral distal ureter is unremarkable.  Left pelvic phleboliths are noted.  Lytic lesions are noted bilateral proximal femur  A small right hydrocele is noted.  Small amount of pelvic free fluid noted within posterior pelvis.  No evidence of colitis or diverticulitis.   IMPRESSION:  1.  There are multiple lytic bony lesions as described above highly suspicious for metastatic disease.  The largest lesion with some cortical destruction in the anterior aspect right iliac bone measures 3 cm.  Further evaluation with bone scan or MRI is recommended.  2.  No evidence of colitis or diverticulitis. 3.  No hydronephrosis or hydroureter.  Bilateral probable renal cyst.  Smaller low density lesions within the right kidney cannot be characterized without IV contrast. 4.  No aortic aneurysm. 5.  No small bowel or colonic obstruction.   Normal appendix. 6.  There is enlarged prostate gland with indentation of urinary bladder base.  Correlation with urology exam is recommended.  A distended urinary bladder is noted. 7.  Small right hydrocele.   Original Report Authenticated By: Natasha Mead, M.D.    Nm Bone Scan Whole Body  06/19/2012  *RADIOLOGY REPORT*  Clinical Data: Weakness.  CT suggested multiple lytic lesions. Evaluate for bony metastatic disease versus myeloma.  NUCLEAR MEDICINE WHOLE BODY BONE SCINTIGRAPHY  Technique:  Whole body anterior and posterior images were obtained approximately 3 hours after intravenous injection of radiopharmaceutical.  Radiopharmaceutical: CURIE TC-MDP TECHNETIUM TC 1M MEDRONATE IV KIT  Comparison: 06/18/2012 CT.  No comparison bone scan.  Findings: CT detected lytic lesions with largest involving the anterior aspect of the right ilium.  On the bone scan, fairly symmetric distribution of radiotracer of the ilium without asymmetric increased uptake within the right ilium as may  be expected if this representing metastatic disease.  Metastatic disease not excluded although myeloma is of higher consideration given the lack of bone scan findings when compared to the CT findings.  The right iliac lesion may represent the easiest lesion to the biopsy.  Correlation with laboratory results also recommended.  Low-level increased radiotracer uptake throughout the femur bilaterally greater on the left and involving the proximal and mid humerus bilaterally.  Etiology indeterminate.  Metabolic abnormality not excluded.  Heterogeneous distribution to the kidneys consistent with presence of renal cyst.  IMPRESSION: Abnormal CT scan and lack of significant focal radiotracer uptake on the current bone scan raises possibility of myeloma.  Laboratory correlation and possible aspirate of the right iliac bony destructive lesion may be considered for further delineation.  The increased uptake involving the femur and humerus  bilaterally may reflect changes of metabolic abnormality although tumor not entirely excluded  This has been made a PRA call report utilizing dashboard call feature.   Original Report Authenticated By: Lacy Duverney, M.D.    Dg Bone Survey Met  06/19/2012  *RADIOLOGY REPORT*  Clinical Data: Lytic lesions.  METASTATIC BONE SURVEY  Comparison: CT of the abdomen pelvis 06/18/2012.  Findings:  The following bones were evaluated:  Lateral skull: Small lytic lesion in the parietal region.  Cervical spine, AP and lateral views: Degenerative disc disease and facet disease.  No visible focal lytic lesion or acute bony abnormality.  Thoracic spine, AP and lateral views: The lytic lesions previously seen on CT are not well visualized by plain films.  Normal alignment.  No acute bony abnormality.  Lumbar spine, AP and lateral views: The previously seen lytic lesions by CT are not well visualized by plain films.  Normal alignment.  Degenerative facet disease.  No acute findings.  Pelvis, AP view: Lucency projects over the right iliac bone in the region of the anterior superior iliac spine as well as in the superior right iliac crest.  These were seen on prior CT.  Left iliac lesion also likely visualize but difficult to separate from adjacent bowel gas.  Bilateral femora, AP views: Lytic lesions seen on CT difficult to visualize by plain film.  Mild degenerative changes in the left hip.  Bilateral shoulders, AP views: No visible focal lytic lesion.  No acute bony abnormality.  Bilateral humeri, AP views:  No visible focal lytic lesion.  No acute bony abnormality.  IMPRESSION:  The only lytic lesions appreciable by plain films are in the calvarium and right hemi pelvis.  The previously seen lytic lesions within the spine and femurs on CT are not visible by plain films.   Original Report Authenticated By: Charlett Nose, M.D.     ROS:  As stated above in the HPI otherwise negative.  Blood pressure 161/66, pulse 58,  temperature 97.9 F (36.6 C), temperature source Oral, resp. rate 18, height 5\' 9"  (1.753 m), weight 69.582 kg (153 lb 6.4 oz), SpO2 100.00%.    PE: Gen: NAD, Alert and Oriented HEENT:  [place]/AT, EOMI Neck: Supple, no LAD Lungs: CTA Bilaterally CV: RRR without M/G/R ABM: Soft, NTND, +BS Ext: No C/C/E  Assessment/Plan: 1) Mid abdominal discomfort/pain. 2) Anemia.   The source of his pain is not apparent, but it is not unreasonable to evaluate the patient with an EGD.  The thought is that he has an hematologic source for his anemia, which is being worked up.    Plan: 1) EGD tomorrow.  Mable Dara D 06/19/2012, 2:36 PM

## 2012-06-19 NOTE — Progress Notes (Signed)
This very nice gentleman was seen today in consultation at the request of Dr. [name]. A full consultation note was recorded by Marlowe Kays, PA.  I have reviewed her observations and am in agreement. I have discussed Mr. Calderone's case with Dr.Norins. I have reviewed the patient's imaging studies with the radiologist.  As suspected, Mr. Dagostino most likely has multiple myeloma on the basis of multiple lytic bone lesions in association with a negative bone scan, an increased total protein and globulins, markedly elevated serum lambda light chains and decreased kappa light chains, new onset renal insufficiency, anemia and thrombocytopenia since May of this year. The peripheral blood smear shows rouleaux formation and large platelets.  The patient's workup following his admission yesterday, 06/18/2012, is ongoing with serum and urine protein studies. We have ordered a bone marrow aspirate and biopsy with flow studies, cytogenetics and FISH for multiple myeloma. Hopefully this can be carried out tomorrow, 06/20/2012. I have ordered a CT scan of the right humerus to rule out a possible lytic lesion noted on the plain x-rays. Mr. Demuro will benefit from red cell transfusions given the hemoglobin of 7.8 today with associated symptomatology.  Mr. Cooler was informed about his probable diagnosis, scheduled workup and the highly effective treatments now available for multiple myeloma.   Samul Dada 06/19/2012 9:39 PM

## 2012-06-19 NOTE — Consult Note (Signed)
Reason for Consult:AKI  Referring Physician: Dr. Haskel Khan Trevor Michael is an 71 y.o. male.  HPI: 71 yr old male with hx HTN >4yr, hx BPH, ^ lipids, GERD, admitted with quesy stomach, N and found to have Cr 3, with baseline O.9 in 5/13.  Also found to have multiple lytic lesions in bone, ^globulin gap, low alb, and large bladder on CT.  Hx of poor appetite for over a month , poor energy for several months.  Has nocturia x 1.  No hx UTIs, or stones, no FH renal disease.  No FH of inherited eye, hearing or ms skel defects.  No NSAIDS or ACEI.  Takes amlodipine and atenolol/HCT.  No rash, or hematuria or gout. ROS: Energy and appetite as above Wgt loss of 5-10lb No HA , hearing or visual prob No cough, no hx smoking, no asthma No CP, PND, orthop,  No D No hs Hepatitis, No GB dz No rash or itching No arthritis but hx neck stiffness and eval in past No syncope No numbness or tingling, or focal weakness No depression, no anxiety issues  Past Medical History  Diagnosis Date  . Other and unspecified hyperlipidemia   . Essential hypertension, benign   . BPH (benign prostatic hyperplasia)   . Early stage glaucoma   . Heart murmur     "since my teens" (06/18/2012)  . Cervical stenosis of spine   . GERD (gastroesophageal reflux disease)     Past Surgical History  Procedure Date  . Lipoma excision 07/1986    left shoulder/scapula    Family History  Problem Relation Age of Onset  . Benign prostatic hyperplasia Father   . Pneumonia Father   . Coronary artery disease Father     PTCA  . Heart disease Brother     CABG, CAD,  . Benign prostatic hyperplasia Brother     Social History:  reports that he has never smoked. He has never used smokeless tobacco. He reports that he does not drink alcohol or use illicit drugs.  Allergies: No Known Allergies  Medications:  I have reviewed the patient's current medications. Prior to Admission:  Prescriptions prior to admission    Medication Sig Dispense Refill  . acetaminophen (TYLENOL) 500 MG tablet Take 1,000 mg by mouth every 6 (six) hours as needed. For pain      . amLODipine (NORVASC) 5 MG tablet Take 5 mg by mouth at bedtime.      Marland Kitchen atenolol-chlorthalidone (TENORETIC) 50-25 MG per tablet Take 1 tablet by mouth daily.      Marland Kitchen b complex vitamins tablet Take 1 tablet by mouth daily.        . bimatoprost (LUMIGAN) 0.03 % ophthalmic drops Place 1 drop into both eyes at bedtime.        . brimonidine-timolol (COMBIGAN) 0.2-0.5 % ophthalmic solution Place 1 drop into both eyes 2 (two) times daily.        . simvastatin (ZOCOR) 20 MG tablet Take 20 mg by mouth at bedtime.      . Thiamine HCl (VITAMIN B-1) 100 MG tablet Take 100 mg by mouth daily.          Results for orders placed during the hospital encounter of 06/18/12 (from the past 48 hour(s))  URINALYSIS, ROUTINE W REFLEX MICROSCOPIC     Status: Abnormal   Collection Time   06/18/12 11:18 AM      Component Value Range Comment   Color, Urine YELLOW  YELLOW  APPearance CLEAR  CLEAR    Specific Gravity, Urine 1.013  1.005 - 1.030    pH 6.0  5.0 - 8.0    Glucose, UA NEGATIVE  NEGATIVE mg/dL    Hgb urine dipstick TRACE (*) NEGATIVE    Bilirubin Urine NEGATIVE  NEGATIVE    Ketones, ur NEGATIVE  NEGATIVE mg/dL    Protein, ur 30 (*) NEGATIVE mg/dL    Urobilinogen, UA 0.2  0.0 - 1.0 mg/dL    Nitrite NEGATIVE  NEGATIVE    Leukocytes, UA NEGATIVE  NEGATIVE   URINE MICROSCOPIC-ADD ON     Status: Normal   Collection Time   06/18/12 11:18 AM      Component Value Range Comment   WBC, UA 3-6  <3 WBC/hpf    RBC / HPF 0-2  <3 RBC/hpf    Bacteria, UA RARE  RARE   CBC WITH DIFFERENTIAL     Status: Abnormal   Collection Time   06/18/12 11:38 AM      Component Value Range Comment   WBC 8.0  4.0 - 10.5 K/uL    RBC 2.57 (*) 4.22 - 5.81 MIL/uL    Hemoglobin 8.2 (*) 13.0 - 17.0 g/dL    HCT 14.7 (*) 82.9 - 52.0 %    MCV 95.3  78.0 - 100.0 fL    MCH 31.9  26.0 - 34.0 pg     MCHC 33.5  30.0 - 36.0 g/dL    RDW 56.2 (*) 13.0 - 15.5 %    Platelets 99 (*) 150 - 400 K/uL PLATELET COUNT CONFIRMED BY SMEAR   Neutrophils Relative 74  43 - 77 %    Neutro Abs 5.9  1.7 - 7.7 K/uL    Lymphocytes Relative 14  12 - 46 %    Lymphs Abs 1.1  0.7 - 4.0 K/uL    Monocytes Relative 11  3 - 12 %    Monocytes Absolute 0.9  0.1 - 1.0 K/uL    Eosinophils Relative 0  0 - 5 %    Eosinophils Absolute 0.0  0.0 - 0.7 K/uL    Basophils Relative 0  0 - 1 %    Basophils Absolute 0.0  0.0 - 0.1 K/uL   COMPREHENSIVE METABOLIC PANEL     Status: Abnormal   Collection Time   06/18/12 11:38 AM      Component Value Range Comment   Sodium 140  135 - 145 mEq/L    Potassium 3.3 (*) 3.5 - 5.1 mEq/L    Chloride 101  96 - 112 mEq/L    CO2 25  19 - 32 mEq/L    Glucose, Bld 100 (*) 70 - 99 mg/dL    BUN 34 (*) 6 - 23 mg/dL    Creatinine, Ser 8.65 (*) 0.50 - 1.35 mg/dL    Calcium 9.3  8.4 - 78.4 mg/dL    Total Protein 9.6 (*) 6.0 - 8.3 g/dL    Albumin 2.9 (*) 3.5 - 5.2 g/dL    AST 27  0 - 37 U/L    ALT 11  0 - 53 U/L    Alkaline Phosphatase 49  39 - 117 U/L    Total Bilirubin 0.4  0.3 - 1.2 mg/dL    GFR calc non Af Amer 19 (*) >90 mL/min    GFR calc Af Amer 22 (*) >90 mL/min   LIPASE, BLOOD     Status: Normal   Collection Time   06/18/12 11:38 AM  Component Value Range Comment   Lipase 15  11 - 59 U/L   LACTATE DEHYDROGENASE     Status: Normal   Collection Time   06/18/12 11:38 AM      Component Value Range Comment   LDH 178  94 - 250 U/L   TROPONIN I     Status: Normal   Collection Time   06/18/12  2:38 PM      Component Value Range Comment   Troponin I <0.30  <0.30 ng/mL   CK TOTAL AND CKMB     Status: Normal   Collection Time   06/18/12  2:38 PM      Component Value Range Comment   Total CK 104  7 - 232 U/L    CK, MB 1.2  0.3 - 4.0 ng/mL    Relative Index 1.2  0.0 - 2.5   VITAMIN B12     Status: Abnormal   Collection Time   06/18/12  2:41 PM      Component Value Range  Comment   Vitamin B-12 1315 (*) 211 - 911 pg/mL   IRON AND TIBC     Status: Abnormal   Collection Time   06/18/12  2:41 PM      Component Value Range Comment   Iron 32 (*) 42 - 135 ug/dL    TIBC 161 (*) 096 - 045 ug/dL    Saturation Ratios 15 (*) 20 - 55 %    UIBC 182  125 - 400 ug/dL   FERRITIN     Status: Normal   Collection Time   06/18/12  2:41 PM      Component Value Range Comment   Ferritin 308  22 - 322 ng/mL   RETICULOCYTES     Status: Abnormal   Collection Time   06/18/12  2:41 PM      Component Value Range Comment   Retic Ct Pct 1.0  0.4 - 3.1 %    RBC. 2.42 (*) 4.22 - 5.81 MIL/uL    Retic Count, Manual 24.2  19.0 - 186.0 K/uL   BASIC METABOLIC PANEL     Status: Abnormal   Collection Time   06/19/12  5:05 AM      Component Value Range Comment   Sodium 138  135 - 145 mEq/L    Potassium 3.4 (*) 3.5 - 5.1 mEq/L    Chloride 104  96 - 112 mEq/L    CO2 20  19 - 32 mEq/L    Glucose, Bld 85  70 - 99 mg/dL    BUN 30 (*) 6 - 23 mg/dL    Creatinine, Ser 4.09 (*) 0.50 - 1.35 mg/dL    Calcium 8.6  8.4 - 81.1 mg/dL    GFR calc non Af Amer 19 (*) >90 mL/min    GFR calc Af Amer 22 (*) >90 mL/min   CBC     Status: Abnormal   Collection Time   06/19/12  5:05 AM      Component Value Range Comment   WBC 6.4  4.0 - 10.5 K/uL    RBC 2.46 (*) 4.22 - 5.81 MIL/uL    Hemoglobin 7.8 (*) 13.0 - 17.0 g/dL    HCT 91.4 (*) 78.2 - 52.0 %    MCV 94.3  78.0 - 100.0 fL    MCH 31.7  26.0 - 34.0 pg    MCHC 33.6  30.0 - 36.0 g/dL    RDW 95.6 (*) 21.3 - 15.5 %  Platelets 87 (*) 150 - 400 K/uL CONSISTENT WITH PREVIOUS RESULT  OCCULT BLOOD X 1 CARD TO LAB, STOOL     Status: Normal   Collection Time   06/19/12  6:38 AM      Component Value Range Comment   Fecal Occult Bld NEGATIVE       Ct Abdomen Pelvis Wo Contrast  06/18/2012  *RADIOLOGY REPORT*  Clinical Data: Epigastric pain, rule out colitis  CT ABDOMEN AND PELVIS WITHOUT CONTRAST  Technique:  Multidetector CT imaging of the abdomen  and pelvis was performed following the standard protocol without intravenous contrast.  Comparison: None.  Findings: Sagittal images of the spine shows a tiny lytic lesion T10 vertebral body.  There is a lytic lesion in the T11 vertebral body right posteriorly measures 1.2 cm.  A subtle small lytic lesion L1 vertebral body measures 6 mm.  There is a lytic lesion in the right iliac bone measures 1.2 cm.  A lytic destructive lesion in the right iliac bone anteriorly axial image 57 measures 3 cm. There is a lytic lesion in the left iliac bone at the level of the SI joint measures 9.6 mm.  Findings are highly suspicious for metastatic disease. Further evaluation with bone scan or MRI is recommended.  Lung bases are unremarkable.  Unenhanced liver is unremarkable. 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  Cystic lesion in the upper pole of the right kidney measures 6.5 cm probable a cyst. There is a probable cyst in the mid pole of the left kidney measures 5 cm.  Additional smaller low density lesions within the right kidney cannot be characterized without IV contrast.  No nephrolithiasis.  No hydronephrosis or hydroureter.  No calcified ureteral calculi are noted.  No aortic aneurysm.  Mild atherosclerotic calcification of  proximal common iliac arteries.  Oral contrast material was given to the patient.  No small bowel obstruction.  No ascites or free air.  No adenopathy.  No pericecal inflammation.  Normal appendix is clearly visualized in axial image 55.  A distended urinary bladder is noted.  There is enlarged prostate gland with indentation of urinary bladder base.  Prostate gland measures 4.9 x 5.6 cm.  Further evaluation with urology exam is recommended.  Bilateral distal ureter is unremarkable.  Left pelvic phleboliths are noted.  Lytic lesions are noted bilateral proximal femur  A small right hydrocele is noted.  Small amount of pelvic free fluid noted within posterior pelvis.  No evidence of colitis or  diverticulitis.   IMPRESSION:  1.  There are multiple lytic bony lesions as described above highly suspicious for metastatic disease.  The largest lesion with some cortical destruction in the anterior aspect right iliac bone measures 3 cm.  Further evaluation with bone scan or MRI is recommended.  2.  No evidence of colitis or diverticulitis. 3.  No hydronephrosis or hydroureter.  Bilateral probable renal cyst.  Smaller low density lesions within the right kidney cannot be characterized without IV contrast. 4.  No aortic aneurysm. 5.  No small bowel or colonic obstruction.  Normal appendix. 6.  There is enlarged prostate gland with indentation of urinary bladder base.  Correlation with urology exam is recommended.  A distended urinary bladder is noted. 7.  Small right hydrocele.   Original Report Authenticated By: Natasha Mead, M.D.     @ROS @ Blood pressure 161/66, pulse 58, temperature 97.9 F (36.6 C), temperature source Oral, resp. rate 18, height 5\' 9"  (1.753 m), weight 69.582 kg (153  lb 6.4 oz), SpO2 100.00%. @PHYSEXAMBYAGE2 @ Physical Examination: General appearance - chronically ill appearing and thin Mental status - alert, oriented to person, place, and time, normal mood, behavior, speech, dress, motor activity, and thought processes Eyes - pupils equal and reactive, extraocular eye movements intact, funduscopic exam normal, discs flat and sharp Mouth - mucous membranes moist, pharynx normal without lesions and pale Neck - adenopathy noted PCL Lymphatics - posterior cervical nodes Chest - clear to auscultation, no wheezes, rales or rhonchi, symmetric air entry Heart - S1 and S2 normal, systolic murmur Gr2/6 at apex Abdomen - mild fullness SP Musculoskeletal - no joint tenderness, deformity or swelling Extremities - peripheral pulses normal, no pedal edema, no clubbing or cyanosis, no pedal edema noted Skin - pale  Assessment/Plan: 1 AKI Has criteria for dysproteinemia, ?MM. W/U in progress,  may be only etiology. But consider BOO with large bladder and exam.  No ^ Ca  And need to check Uric Acid. Has large cysts on CT , needs U/s 2 ^ Glob gap 3 Hypertension: controlled 4. Anemia ? MM and some role of renal dz 5. Lytic lesions P U/S, Foley SPEP/UPEP, check Uric acid,   Abbigail Anstey L 06/19/2012, 11:15 AM

## 2012-06-19 NOTE — Consult Note (Signed)
ONCOLOGY  HOSPITAL CONSULTATION NOTE  Trevor Michael                                MR#: [id number]  DOB: [birth year]                       CSN#: [id number]  Referring MD:Dr. Debby Bud Primary MD: Dr. Debby Bud  Reason for Consult: Anemia   HPI:71 year old  male  admitted  Via Urgent Care on 06/18/2012 with several day history of malaise and mild abdominal discomfort, nausea and dry heaves as well as a 5 lb weight loss during the last month. CBC demonstrated Hb 8.2 MCV 84.3.  Of note, his platelets are low at 87kSmear has been ordered for review. Retic Count is 1 %, absolute 24.2, LDH 178 ,Fe levels were 32 , TIBC 214 , percent saturation 15,Ferritin 308, B12 1315 , Folate N/A . Never had a transfusion in the past. His urine protein 30. Trace of Hb in urine was seen. Sodium in serum 138, Calcium 8.6. His creatinine was 3.04 No family history of hematological disorders. Patient had never been evaluated for anemia by a hematologist. Never received IV Iron. Never had a bone marrow biopsy. No hemoptysis or epistaxis. No blood in urine or in stool (FOB negative) Her last colonoscopy / EGD  was in 2004, negative for malignancy.  No NSAIDs or ASA.  No significant amount of Ice chips, coffee, starch, iced tea. Denies risk factors for HIV or hepatitis. CT of the abdomen and pelvis  Without contrast on  06/18/2012 showed There are multiple lytic bony lesions, including tiny T10, T11, L1 highly suspicious for metastatic disease. The largest lesion with some cortical destruction in the anterior aspect right iliac bone measures 3 cm. At the S1 L iliac bone there is a 9.6 mm lesion suspicious for malignancy. No hydronephrosis or hydroureter.No small bowel or colonic obstruction.There is enlarged prostate gland at 4.9 x 5.6 cm with indentation of urinary bladder base.Of note, patient had been seen in the past by Dr. Isabel Caprice for BPH and elevation of PSA. Multiple myeloma workup is pending. Uric acid is pending as well. LDH is  178. Metastatic Bone Survey has been ordered.   We were kindly requested to see the patient with recommendations.         PMH:  Past Medical History  Diagnosis Date  . Other and unspecified hyperlipidemia   . Essential hypertension, benign   . BPH (benign prostatic hyperplasia)   . Early stage glaucoma   . Heart murmur     "since my teens" (06/18/2012)  . Cervical stenosis of spine   . GERD (gastroesophageal reflux disease)     Surgeries:  Past Surgical History  Procedure Date  . Lipoma excision 07/1986    left shoulder/scapula  s/p laser surgery left eye    Allergies: No Known Allergies  Medications:   Prior to Admission:  Prescriptions prior to admission  Medication Sig Dispense Refill  . acetaminophen (TYLENOL) 500 MG tablet Take 1,000 mg by mouth every 6 (six) hours as needed. For pain      . amLODipine (NORVASC) 5 MG tablet Take 5 mg by mouth at bedtime.      Marland Kitchen atenolol-chlorthalidone (TENORETIC) 50-25 MG per tablet Take 1 tablet by mouth daily.      Marland Kitchen b complex vitamins tablet Take 1 tablet by mouth daily.        Marland Kitchen  bimatoprost (LUMIGAN) 0.03 % ophthalmic drops Place 1 drop into both eyes at bedtime.        . brimonidine-timolol (COMBIGAN) 0.2-0.5 % ophthalmic solution Place 1 drop into both eyes 2 (two) times daily.        . simvastatin (ZOCOR) 20 MG tablet Take 20 mg by mouth at bedtime.      . Thiamine HCl (VITAMIN B-1) 100 MG tablet Take 100 mg by mouth daily.          BJY:[id number], acetaminophen, ondansetron (ZOFRAN) IV, ondansetron, DISCONTD: ondansetron (ZOFRAN) IV  ROS: See HPI for significant positives, rest of the ROS is negative.  Family History:   11 siblings, one alive with hypernephroma, s/p nephrectomy, one brother alive with a history of "pancreatic tumor". Mother and father died of old age, but father had BPH..    Social History:  reports that he has never smoked. He has never used smokeless tobacco. He reports that he does not drink  alcohol or use illicit drugs. Lives in [place]. 1 daughter, deceased with sepsis. Married. Retired Public house manager of  of [employer] Active in the [employer], [employer], [employer] and [employer], and [employer] and [employer] of [employer]. Board of Designer, television/film set. Was active until very recently.   Physical Exam    Filed Vitals:   06/19/12 0914  BP: 161/66  Pulse: 58  Temp: 97.9 F (36.6 C)  Resp: 18     Filed Weights   06/18/12 2203  Weight: 153 lb 6.4 oz (69.582 kg)   General:  76 -year-old AAM in no acute distress A. and O. x3  well-developed, well-nourished.  HEENT: Normocephalic, atraumatic, PERRLA. Sclerae anicteric. Oral cavity without thrush or lesions. NECK:supple. no thyromegaly, shotty posterior cervical bilateral adenopathy  LUNGS: clear bilaterally . No wheezing, rhonchi or rales. No axillary masses. BREASTS: not examined. CARDIOVASCULAR: regular rate and rhythm normal S1-S2, 1/6 SM , rubs or gallops ABDOMEN: soft nontender , bowel sounds x4. No HSM. No masses palpable.  GU/rectal: deferred. EXTREMITIES: no clubbing cyanosis or edema. No bruising or petechial rash MUSCULOSKELETAL: no spinal tenderness.  NEURO: Non Focal. No Horner's.   Labs:  CBC   Lab 06/19/12 0505 06/18/12 1138  WBC 6.4 8.0  HGB 7.8* 8.2*  HCT 23.2* 24.5*  PLT 87* 99*  MCV 94.3 95.3  MCH 31.7 31.9  MCHC 33.6 33.5  RDW 16.5* 16.2*  LYMPHSABS -- 1.1  MONOABS -- 0.9  EOSABS -- 0.0  BASOSABS -- 0.0  BANDABS -- --     CMP    Lab 06/19/12 0505 06/18/12 1138  NA 138 140  K 3.4* 3.3*  CL 104 101  CO2 20 25  GLUCOSE 85 100*  BUN 30* 34*  CREATININE 3.06* 3.04*  CALCIUM 8.6 9.3  MG -- --  AST -- 27  ALT -- 11  ALKPHOS -- 49  BILITOT -- 0.4     Anemia panel:   Basename 06/18/12 1441  VITAMINB12 1315*  FOLATE --  FERRITIN 308  TIBC 214*  IRON 32*  RETICCTPCT 1.0     Imaging Studies:  Ct  Abdomen Pelvis Wo Contrast  06/18/2012  *RADIOLOGY REPORT*  Clinical Data: Epigastric pain, rule out colitis  CT ABDOMEN AND PELVIS WITHOUT CONTRAST  Technique:  Multidetector CT imaging of the abdomen and pelvis was performed following the standard protocol without intravenous contrast.  Comparison: None.  Findings: Sagittal images of the spine shows a tiny  lytic lesion T10 vertebral body.  There is a lytic lesion in the T11 vertebral body right posteriorly measures 1.2 cm.  A subtle small lytic lesion L1 vertebral body measures 6 mm.  There is a lytic lesion in the right iliac bone measures 1.2 cm.  A lytic destructive lesion in the right iliac bone anteriorly axial image 57 measures 3 cm. There is a lytic lesion in the left iliac bone at the level of the SI joint measures 9.6 mm.  Findings are highly suspicious for metastatic disease. Further evaluation with bone scan or MRI is recommended.  Lung bases are unremarkable.  Unenhanced liver is unremarkable. 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  Cystic lesion in the upper pole of the right kidney measures 6.5 cm probable a cyst. There is a probable cyst in the mid pole of the left kidney measures 5 cm.  Additional smaller low density lesions within the right kidney cannot be characterized without IV contrast.  No nephrolithiasis.  No hydronephrosis or hydroureter.  No calcified ureteral calculi are noted.  No aortic aneurysm.  Mild atherosclerotic calcification of  proximal common iliac arteries.  Oral contrast material was given to the patient.  No small bowel obstruction.  No ascites or free air.  No adenopathy.  No pericecal inflammation.  Normal appendix is clearly visualized in axial image 55.  A distended urinary bladder is noted.  There is enlarged prostate gland with indentation of urinary bladder base.  Prostate gland measures 4.9 x 5.6 cm.  Further evaluation with urology exam is recommended.  Bilateral distal ureter is unremarkable.  Left pelvic  phleboliths are noted.  Lytic lesions are noted bilateral proximal femur  A small right hydrocele is noted.  Small amount of pelvic free fluid noted within posterior pelvis.  No evidence of colitis or diverticulitis.   IMPRESSION:  1.  There are multiple lytic bony lesions as described above highly suspicious for metastatic disease.  The largest lesion with some cortical destruction in the anterior aspect right iliac bone measures 3 cm.  Further evaluation with bone scan or MRI is recommended.  2.  No evidence of colitis or diverticulitis. 3.  No hydronephrosis or hydroureter.  Bilateral probable renal cyst.  Smaller low density lesions within the right kidney cannot be characterized without IV contrast. 4.  No aortic aneurysm. 5.  No small bowel or colonic obstruction.  Normal appendix. 6.  There is enlarged prostate gland with indentation of urinary bladder base.  Correlation with urology exam is recommended.  A distended urinary bladder is noted. 7.  Small right hydrocele.   Original Report Authenticated By: Natasha Mead, M.D.       A/P: 71 y.o. male admitted with symptomatic anemia, found to have acute renal failure and multiple lytic lesions per CT of the abdomen and pelvis. Multiple myeloma workup is currently in progress, including. Sed rate, quantitative immunoglobulins, LDH, B2 microglobulins, serum light chains, metastatic bone survey, uric acid, 24 hr urine peripheral blood smear to rule out rouleaux, and  Possible Bone marrow biopsy to be performed if indicated.   Dr. Arline Asp  is to see the patient following this consult with recommendations regarding further workup studies in search for the diagnosis. Addendum to this note to be written. Thank you for the referral.  [facility] E 06/19/2012 10:23 AM

## 2012-06-20 ENCOUNTER — Other Ambulatory Visit: Payer: Self-pay | Admitting: Radiology

## 2012-06-20 ENCOUNTER — Encounter (HOSPITAL_COMMUNITY)
Admission: EM | Disposition: A | Discharge status: Home / Self Care | Payer: Self-pay | Source: Home / Self Care | Attending: Internal Medicine

## 2012-06-20 ENCOUNTER — Encounter (HOSPITAL_COMMUNITY): Payer: Self-pay | Admitting: *Deleted

## 2012-06-20 ENCOUNTER — Inpatient Hospital Stay (HOSPITAL_COMMUNITY): Payer: Medicare Other

## 2012-06-20 HISTORY — PX: ESOPHAGOGASTRODUODENOSCOPY: SHX5428

## 2012-06-20 LAB — PARATHYROID HORMONE, INTACT (NO CA): PTH: 21 pg/mL (ref 14.0–72.0)

## 2012-06-20 LAB — MULTIPLE MYELOMA PANEL, SERUM
Alpha-1-Globulin: 2.7 % — ABNORMAL LOW (ref 2.9–4.9)
Alpha-2-Globulin: 9 % (ref 7.1–11.8)
Beta Globulin: 51.1 % — ABNORMAL HIGH (ref 4.7–7.2)
IgA: 5330 mg/dL — ABNORMAL HIGH (ref 68–379)
IgG (Immunoglobin G), Serum: 131 mg/dL — ABNORMAL LOW (ref 650–1600)
M-Spike, %: 4 g/dL
Total Protein: 8.5 g/dL — ABNORMAL HIGH (ref 6.0–8.3)

## 2012-06-20 LAB — COMPREHENSIVE METABOLIC PANEL
AST: 24 U/L (ref 0–37)
BUN: 24 mg/dL — ABNORMAL HIGH (ref 6–23)
CO2: 19 mEq/L (ref 19–32)
Calcium: 8.7 mg/dL (ref 8.4–10.5)
Chloride: 108 mEq/L (ref 96–112)
Creatinine, Ser: 2.89 mg/dL — ABNORMAL HIGH (ref 0.50–1.35)
GFR calc Af Amer: 24 mL/min — ABNORMAL LOW (ref >90)
GFR calc non Af Amer: 20 mL/min — ABNORMAL LOW (ref >90)
Glucose, Bld: 86 mg/dL (ref 70–99)
Total Bilirubin: 0.3 mg/dL (ref 0.3–1.2)

## 2012-06-20 LAB — IGG, IGA, IGM
IgG (Immunoglobin G), Serum: 122 mg/dL — ABNORMAL LOW (ref 650–1600)
IgM, Serum: <5 mg/dL — ABNORMAL LOW (ref 41–251)

## 2012-06-20 LAB — CBC
HCT: 22.1 % — ABNORMAL LOW (ref 39.0–52.0)
Hemoglobin: 7.4 g/dL — ABNORMAL LOW (ref 13.0–17.0)
MCH: 31.9 pg (ref 26.0–34.0)
MCV: 95.3 fL (ref 78.0–100.0)
RBC: 2.32 MIL/uL — ABNORMAL LOW (ref 4.22–5.81)
WBC: 6.3 10*3/uL (ref 4.0–10.5)

## 2012-06-20 LAB — KAPPA/LAMBDA LIGHT CHAINS
Kappa free light chain: 0.03 mg/dL — ABNORMAL LOW (ref 0.33–1.94)
Kappa, lambda light chain ratio: 0 — ABNORMAL LOW (ref 0.26–1.65)
Lambda free light chains: 133 mg/dL — ABNORMAL HIGH (ref 0.57–2.63)

## 2012-06-20 LAB — BETA 2 MICROGLOBULIN, SERUM: Beta-2 Microglobulin: 5.89 mg/L — ABNORMAL HIGH (ref 1.01–1.73)

## 2012-06-20 LAB — PHOSPHORUS: Phosphorus: 3.6 mg/dL (ref 2.3–4.6)

## 2012-06-20 SURGERY — EGD (ESOPHAGOGASTRODUODENOSCOPY)
Anesthesia: Moderate Sedation

## 2012-06-20 MED ORDER — DIPHENHYDRAMINE HCL 50 MG/ML IJ SOLN
INTRAMUSCULAR | Status: AC
  Filled 2012-06-20: qty 1

## 2012-06-20 MED ORDER — FENTANYL CITRATE 0.05 MG/ML IJ SOLN
INTRAMUSCULAR | Status: AC
  Filled 2012-06-20: qty 4

## 2012-06-20 MED ORDER — ALLOPURINOL 100 MG PO TABS: 100.0000 mg | ORAL_TABLET | Freq: Every day | ORAL | Status: DC

## 2012-06-20 MED ORDER — FUROSEMIDE 10 MG/ML IJ SOLN
20.0000 mg | Freq: Once | INTRAMUSCULAR | Status: AC
  Administered 2012-06-20: 20 mg via INTRAVENOUS
  Filled 2012-06-20 (×2): qty 2

## 2012-06-20 MED ORDER — FENTANYL CITRATE 0.05 MG/ML IJ SOLN
INTRAMUSCULAR | Status: DC | PRN
  Administered 2012-06-20 (×2): 25 ug via INTRAVENOUS

## 2012-06-20 MED ORDER — MIDAZOLAM HCL 5 MG/ML IJ SOLN
INTRAMUSCULAR | Status: AC
  Filled 2012-06-20: qty 3

## 2012-06-20 MED ORDER — MIDAZOLAM HCL 5 MG/ML IJ SOLN
INTRAMUSCULAR | Status: AC
  Filled 2012-06-20: qty 2

## 2012-06-20 MED ORDER — FENTANYL CITRATE 0.05 MG/ML IJ SOLN
INTRAMUSCULAR | Status: AC
  Filled 2012-06-20: qty 2

## 2012-06-20 MED ORDER — SODIUM BICARBONATE 650 MG PO TABS
1300.0000 mg | ORAL_TABLET | Freq: Two times a day (BID) | ORAL | Status: DC
  Administered 2012-06-20 – 2012-06-21 (×3): 1300 mg via ORAL
  Filled 2012-06-20 (×4): qty 2

## 2012-06-20 MED ORDER — MIDAZOLAM HCL 10 MG/2ML IJ SOLN
INTRAMUSCULAR | Status: DC | PRN
  Administered 2012-06-20 (×2): 2 mg via INTRAVENOUS
  Administered 2012-06-20: 1 mg via INTRAVENOUS

## 2012-06-20 NOTE — Op Note (Signed)
[facility] [address] [place], [address]   OPERATIVE PROCEDURE REPORT  PATIENT: Lanier, Millon  MR#: [id number] BIRTHDATE: [birth year]  GENDER: Male ENDOSCOPIST: Jeani Hawking, MD ASSISTANT:   Ara Kussmaul, technician PROCEDURE DATE: 06/20/2012 PROCEDURE:   EGD, diagnostic ASA CLASS:   Class III INDICATIONS:periumbilical abdominal pain. MEDICATIONS: Versed 5 mg IV and Fentanyl 50 mcg IV TOPICAL ANESTHETIC:   none  DESCRIPTION OF PROCEDURE:   After the risks benefits and alternatives of the procedure were thoroughly explained, informed consent was obtained.  The Pentax Gastroscope [id number]  endoscope was introduced through the mouth  and advanced to the second portion of the duodenum Without limitations.      The instrument was slowly withdrawn as the mucosa was fully examined.    FINDINGS: The upper, middle and distal third of the esophagus were carefully inspected and no abnormalities were noted.  The z-line was well seen at the GEJ.  The endoscope was pushed into the fundus which was normal including a retroflexed view.  The antrum, gastric body, first and second part of the duodenum were unremarkable. The scope was then withdrawn from the patient and the procedure terminated.  COMPLICATIONS: There were no complications.  IMPRESSION: 1) Normal EGD  RECOMMENDATIONS: 1) Work up hematologic and renal issues.  I feel that this may be the source of his vague and diffuse abdominal discomfort.   _______________________________ eSigned:  Jeani Hawking, MD 06/20/2012 10:16 AM

## 2012-06-20 NOTE — Progress Notes (Signed)
Subjective: Interval History: none.  Objective: Vital signs in last 24 hours: Temp:  [98.4 F (36.9 C)-98.9 F (37.2 C)] 98.4 F (36.9 C) (11/01 0921) Pulse Rate:  [60-77] 77  (11/01 0918) Resp:  [16-88] 25  (11/01 1035) BP: (124-185)/(55-104) 142/64 mmHg (11/01 1035) SpO2:  [93 %-100 %] 98 % (11/01 1035) Weight:  [68.765 kg (151 lb 9.6 oz)] 68.765 kg (151 lb 9.6 oz) (10/31 2249) Weight change: -0.816 kg (-1 lb 12.8 oz)  Intake/Output from previous day: 10/31 0701 - 11/01 0700 In: 3040 [P.O.:640; I.V.:2400] Out: 2051 [Urine:2050; Stool:1] Intake/Output this shift:    General appearance: alert and cooperative Resp: rales bibasilar Cardio: regular rate and rhythm, S1, S2 normal and systolic murmur: holosystolic 2/6, blowing at apex GI: live down 2 cm Extremities: extremities normal, atraumatic, no cyanosis or edema  Lab Results:  [facility] 06/20/12 0625 06/19/12 0505  WBC 6.3 6.4  HGB 7.4* 7.8*  HCT 22.1* 23.2*  PLT 75* 87*   BMET:  Basename 06/20/12 0625 06/19/12 0505  NA 141 138  K 3.8 3.4*  CL 108 104  CO2 19 20  GLUCOSE 86 85  BUN 24* 30*  CREATININE 2.89* 3.06*  CALCIUM 8.7 8.6   No results found for this basename: PTH:2 in the last 72 hours Iron Studies:  Basename 06/18/12 1441  IRON 32*  TIBC 214*  TRANSFERRIN --  FERRITIN 308    Studies/Results: Ct Abdomen Pelvis Wo Contrast  06/18/2012  *RADIOLOGY REPORT*  Clinical Data: Epigastric pain, rule out colitis  CT ABDOMEN AND PELVIS WITHOUT CONTRAST  Technique:  Multidetector CT imaging of the abdomen and pelvis was performed following the standard protocol without intravenous contrast.  Comparison: None.  Findings: Sagittal images of the spine shows a tiny lytic lesion T10 vertebral body.  There is a lytic lesion in the T11 vertebral body right posteriorly measures 1.2 cm.  A subtle small lytic lesion L1 vertebral body measures 6 mm.  There is a lytic lesion in the right iliac bone measures 1.2 cm.  A  lytic destructive lesion in the right iliac bone anteriorly axial image 57 measures 3 cm. There is a lytic lesion in the left iliac bone at the level of the SI joint measures 9.6 mm.  Findings are highly suspicious for metastatic disease. Further evaluation with bone scan or MRI is recommended.  Lung bases are unremarkable.  Unenhanced liver is unremarkable. 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  Cystic lesion in the upper pole of the right kidney measures 6.5 cm probable a cyst. There is a probable cyst in the mid pole of the left kidney measures 5 cm.  Additional smaller low density lesions within the right kidney cannot be characterized without IV contrast.  No nephrolithiasis.  No hydronephrosis or hydroureter.  No calcified ureteral calculi are noted.  No aortic aneurysm.  Mild atherosclerotic calcification of  proximal common iliac arteries.  Oral contrast material was given to the patient.  No small bowel obstruction.  No ascites or free air.  No adenopathy.  No pericecal inflammation.  Normal appendix is clearly visualized in axial image 55.  A distended urinary bladder is noted.  There is enlarged prostate gland with indentation of urinary bladder base.  Prostate gland measures 4.9 x 5.6 cm.  Further evaluation with urology exam is recommended.  Bilateral distal ureter is unremarkable.  Left pelvic phleboliths are noted.  Lytic lesions are noted bilateral proximal femur  A small right hydrocele is noted.  Small  amount of pelvic free fluid noted within posterior pelvis.  No evidence of colitis or diverticulitis.   IMPRESSION:  1.  There are multiple lytic bony lesions as described above highly suspicious for metastatic disease.  The largest lesion with some cortical destruction in the anterior aspect right iliac bone measures 3 cm.  Further evaluation with bone scan or MRI is recommended.  2.  No evidence of colitis or diverticulitis. 3.  No hydronephrosis or hydroureter.  Bilateral probable renal cyst.   Smaller low density lesions within the right kidney cannot be characterized without IV contrast. 4.  No aortic aneurysm. 5.  No small bowel or colonic obstruction.  Normal appendix. 6.  There is enlarged prostate gland with indentation of urinary bladder base.  Correlation with urology exam is recommended.  A distended urinary bladder is noted. 7.  Small right hydrocele.   Original Report Authenticated By: Natasha Mead, M.D.    Nm Bone Scan Whole Body  06/19/2012  *RADIOLOGY REPORT*  Clinical Data: Weakness.  CT suggested multiple lytic lesions. Evaluate for bony metastatic disease versus myeloma.  NUCLEAR MEDICINE WHOLE BODY BONE SCINTIGRAPHY  Technique:  Whole body anterior and posterior images were obtained approximately 3 hours after intravenous injection of radiopharmaceutical.  Radiopharmaceutical: CURIE TC-MDP TECHNETIUM TC 76M MEDRONATE IV KIT  Comparison: 06/18/2012 CT.  No comparison bone scan.  Findings: CT detected lytic lesions with largest involving the anterior aspect of the right ilium.  On the bone scan, fairly symmetric distribution of radiotracer of the ilium without asymmetric increased uptake within the right ilium as may be expected if this representing metastatic disease.  Metastatic disease not excluded although myeloma is of higher consideration given the lack of bone scan findings when compared to the CT findings.  The right iliac lesion may represent the easiest lesion to the biopsy.  Correlation with laboratory results also recommended.  Low-level increased radiotracer uptake throughout the femur bilaterally greater on the left and involving the proximal and mid humerus bilaterally.  Etiology indeterminate.  Metabolic abnormality not excluded.  Heterogeneous distribution to the kidneys consistent with presence of renal cyst.  IMPRESSION: Abnormal CT scan and lack of significant focal radiotracer uptake on the current bone scan raises possibility of myeloma.  Laboratory  correlation and possible aspirate of the right iliac bony destructive lesion may be considered for further delineation.  The increased uptake involving the femur and humerus bilaterally may reflect changes of metabolic abnormality although tumor not entirely excluded  This has been made a PRA call report utilizing dashboard call feature.   Original Report Authenticated By: Lacy Duverney, M.D.    US Renal  06/19/2012  *RADIOLOGY REPORT*  Clinical Data: Acute renal insufficiency.  RENAL/URINARY TRACT ULTRASOUND COMPLETE  Comparison:  CT scan dated 06/18/2012  Findings:  Right Kidney:  13.9 cm in length.  6.7 cm simple cyst on the lower pole.  15 mm cyst more medially on the lower pole and 13 mm cyst more medially in the midportion of the kidney.  Renal parenchyma is increased in echogenicity consistent with renal medical disease. No hydronephrosis.  Left Kidney:  12.8 cm in length.  Simple appearing 5 cm cyst on the upper pole.  Increased echogenicity of the renal parenchyma suggesting renal medical disease.  No hydronephrosis.  Bladder:  Prostate gland is prominent, measuring 5.3 x 4.5 x 4.9 cm.  Bladder appears normal.  IMPRESSION:  1.  Increased echogenicity of the renal parenchyma bilaterally consistent with renal medical disease. 2.  Benign-appearing cysts on both kidneys.  No renal obstruction. 3.  Slightly enlarged prostate gland.   Original Report Authenticated By: Francene Boyers, M.D.    Dg Bone Survey Met  06/19/2012  *RADIOLOGY REPORT*  Clinical Data: Lytic lesions.  METASTATIC BONE SURVEY  Comparison: CT of the abdomen pelvis 06/18/2012.  Findings:  The following bones were evaluated:  Lateral skull: Small lytic lesion in the parietal region.  Cervical spine, AP and lateral views: Degenerative disc disease and facet disease.  No visible focal lytic lesion or acute bony abnormality.  Thoracic spine, AP and lateral views: The lytic lesions previously seen on CT are not well visualized by plain films.   Normal alignment.  No acute bony abnormality.  Lumbar spine, AP and lateral views: The previously seen lytic lesions by CT are not well visualized by plain films.  Normal alignment.  Degenerative facet disease.  No acute findings.  Pelvis, AP view: Lucency projects over the right iliac bone in the region of the anterior superior iliac spine as well as in the superior right iliac crest.  These were seen on prior CT.  Left iliac lesion also likely visualize but difficult to separate from adjacent bowel gas.  Bilateral femora, AP views: Lytic lesions seen on CT difficult to visualize by plain film.  Mild degenerative changes in the left hip.  Bilateral shoulders, AP views: No visible focal lytic lesion.  No acute bony abnormality.  Bilateral humeri, AP views:  No visible focal lytic lesion.  No acute bony abnormality.  IMPRESSION:  The only lytic lesions appreciable by plain films are in the calvarium and right hemi pelvis.  The previously seen lytic lesions within the spine and femurs on CT are not visible by plain films.   Original Report Authenticated By: Charlett Nose, M.D.     I have reviewed the patient's current medications.  Assessment/Plan: 1 AKI Cr stable, much urine with foley, ?? BOO.  No improvement GFR.  Mild acidemia, add bicarb.  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 D/C IVF, bicarb, Allopurinol    LOS: 2 days   Coben Godshall L 06/20/2012,10:58 AM

## 2012-06-20 NOTE — Progress Notes (Signed)
Subjective: Slep fairly well. Has no c/o except he would like some water.   Objective: Lab: Lab Results  Component Value Date   WBC 6.3 06/20/2012   HGB 7.4* 06/20/2012   HCT 22.1* 06/20/2012   MCV 95.3 06/20/2012   PLT 75* 06/20/2012   BMET    Component Value Date/Time   NA 141 06/20/2012 0625   K 3.8 06/20/2012 0625   CL 108 06/20/2012 0625   CO2 19 06/20/2012 0625   GLUCOSE 86 06/20/2012 0625   BUN 24* 06/20/2012 0625   CREATININE 2.89* 06/20/2012 0625   CALCIUM 8.7 06/20/2012 0625   GFRNONAA 20* 06/20/2012 0625   GFRAA 24* 06/20/2012 0625     Imaging:  Scheduled Meds:   . allopurinol  100 mg Oral Daily  . amLODipine  5 mg Oral QHS  . bimatoprost  1 drop Both Eyes QHS  . brimonidine  1 drop Both Eyes BID   And  . timolol  1 drop Both Eyes BID  . ferumoxytol  510 mg Intravenous Once  . pantoprazole (PROTONIX) IV  40 mg Intravenous Q12H  . sodium chloride  3 mL Intravenous Q12H  . DISCONTD: levalbuterol  0.63 mg Nebulization Q6H   Continuous Infusions:   . 0.9 % NaCl with KCl 20 mEq / L 100 mL/hr (06/19/12 1806)   PRN Meds:.acetaminophen, acetaminophen, levalbuterol, ondansetron (ZOFRAN) IV, ondansetron, technetium medronate   Physical Exam: Filed Vitals:   06/20/12 0539  BP: 136/55  Pulse: 63  Temp: 98.4 F (36.9 C)  Resp: 16   gen'l- slender AA man in no distress Cor- RRR Pulm - normal respirations     Assessment/Plan: 1. Anemia - continue drop in Hgb to 7.4 Plan T&C and transfuse 2 uPRBCs  F/u H/H  2. Renal failure - Creatinine is down slightly. Diagnostic studies pending Plan F/u Bmet in AM  3. Hematology - increased serum protein, increased light chains. Plan For CT guided BM biopsy and aspiration - hopefully today.  4. GI - no c/o abdominal pain, no hematemesis, melena. EGD suggested and to be done but BM biopsy is our priority in regard to scheduling.  Appreciate consultants assistance.   Payslie Mccaig [place] [facility] [phone]) [phone]; (c)  [phone] Call-grp - [facility] Tele: [phone]  06/20/2012, 8:41 AM    

## 2012-06-20 NOTE — H&P (View-Only) (Signed)
Subjective: Slep fairly well. Has no c/o except he would like some water.   Objective: Lab: Lab Results  Component Value Date   WBC 6.3 06/20/2012   HGB 7.4* 06/20/2012   HCT 22.1* 06/20/2012   MCV 95.3 06/20/2012   PLT 75* 06/20/2012   BMET    Component Value Date/Time   NA 141 06/20/2012 0625   K 3.8 06/20/2012 0625   CL 108 06/20/2012 0625   CO2 19 06/20/2012 0625   GLUCOSE 86 06/20/2012 0625   BUN 24* 06/20/2012 0625   CREATININE 2.89* 06/20/2012 0625   CALCIUM 8.7 06/20/2012 0625   GFRNONAA 20* 06/20/2012 0625   GFRAA 24* 06/20/2012 0625     Imaging:  Scheduled Meds:   . allopurinol  100 mg Oral Daily  . amLODipine  5 mg Oral QHS  . bimatoprost  1 drop Both Eyes QHS  . brimonidine  1 drop Both Eyes BID   And  . timolol  1 drop Both Eyes BID  . ferumoxytol  510 mg Intravenous Once  . pantoprazole (PROTONIX) IV  40 mg Intravenous Q12H  . sodium chloride  3 mL Intravenous Q12H  . DISCONTD: levalbuterol  0.63 mg Nebulization Q6H   Continuous Infusions:   . 0.9 % NaCl with KCl 20 mEq / L 100 mL/hr (06/19/12 1806)   PRN Meds:.acetaminophen, acetaminophen, levalbuterol, ondansetron (ZOFRAN) IV, ondansetron, technetium medronate   Physical Exam: Filed Vitals:   06/20/12 0539  BP: 136/55  Pulse: 63  Temp: 98.4 F (36.9 C)  Resp: 16   gen'l- slender AA man in no distress Cor- RRR Pulm - normal respirations     Assessment/Plan: 1. Anemia - continue drop in Hgb to 7.4 Plan T&C and transfuse 2 uPRBCs  F/u H/H  2. Renal failure - Creatinine is down slightly. Diagnostic studies pending Plan F/u Bmet in AM  3. Hematology - increased serum protein, increased light chains. Plan For CT guided BM biopsy and aspiration - hopefully today.  4. GI - no c/o abdominal pain, no hematemesis, melena. EGD suggested and to be done but BM biopsy is our priority in regard to scheduling.  Appreciate consultants assistance.   [name] [place] [facility] [phone]) [phone]; (c)  [phone] Call-grp - [facility] Tele: [phone]  06/20/2012, 8:41 AM

## 2012-06-20 NOTE — Interval H&P Note (Signed)
History and Physical Interval Note:  06/20/2012 10:01 AM  Trevor Michael  has presented today for surgery, with the diagnosis of Epigastric pain  The various methods of treatment have been discussed with the patient and family. After consideration of risks, benefits and other options for treatment, the patient has consented to  Procedure(s) (LRB) with comments: ESOPHAGOGASTRODUODENOSCOPY (EGD) (N/A) as a surgical intervention .  The patient's history has been reviewed, patient examined, no change in status, stable for surgery.  I have reviewed the patient's chart and labs.  Questions were answered to the patient's satisfaction.     [name] D

## 2012-06-21 ENCOUNTER — Other Ambulatory Visit: Payer: Self-pay | Admitting: Internal Medicine

## 2012-06-21 DIAGNOSIS — I10 Essential (primary) hypertension: Secondary | ICD-10-CM

## 2012-06-21 DIAGNOSIS — N179 Acute kidney failure, unspecified: Secondary | ICD-10-CM

## 2012-06-21 DIAGNOSIS — C9000 Multiple myeloma not having achieved remission: Secondary | ICD-10-CM

## 2012-06-21 LAB — TYPE AND SCREEN
Antibody Screen: NEGATIVE
Unit division: 0

## 2012-06-21 LAB — RENAL FUNCTION PANEL
CO2: 21 mEq/L (ref 19–32)
Calcium: 9 mg/dL (ref 8.4–10.5)
Chloride: 104 mEq/L (ref 96–112)
GFR calc Af Amer: 24 mL/min — ABNORMAL LOW (ref >90)
Glucose, Bld: 88 mg/dL (ref 70–99)
Sodium: 141 mEq/L (ref 135–145)

## 2012-06-21 LAB — CBC
Hemoglobin: 9.6 g/dL — ABNORMAL LOW (ref 13.0–17.0)
MCHC: 34.2 g/dL (ref 30.0–36.0)
Platelets: 75 10*3/uL — ABNORMAL LOW (ref 150–400)
RDW: 16.3 % — ABNORMAL HIGH (ref 11.5–15.5)

## 2012-06-21 MED ORDER — ALLOPURINOL 100 MG PO TABS: 100.0000 mg | ORAL_TABLET | Freq: Every day | ORAL | Status: DC

## 2012-06-21 MED ORDER — ESOMEPRAZOLE MAGNESIUM 40 MG PO CPDR: 40.0000 mg | DELAYED_RELEASE_CAPSULE | Freq: Every day | ORAL | Status: DC

## 2012-06-21 MED ORDER — PANTOPRAZOLE SODIUM 40 MG PO TBEC
40.0000 mg | DELAYED_RELEASE_TABLET | Freq: Two times a day (BID) | ORAL | Status: DC
  Administered 2012-06-21: 40 mg via ORAL
  Filled 2012-06-21: qty 1

## 2012-06-21 MED ORDER — SODIUM BICARBONATE 650 MG PO TABS: 1300.0000 mg | ORAL_TABLET | Freq: Two times a day (BID) | ORAL | Status: DC

## 2012-06-21 NOTE — Progress Notes (Signed)
Subjective: Interval History: none.  Objective: Vital signs in last 24 hours: Temp:  [97.7 F (36.5 C)-99.1 F (37.3 C)] 98.1 F (36.7 C) (11/02 0742) Pulse Rate:  [67-76] 68  (11/02 0742) Resp:  [17-88] 18  (11/02 0742) BP: (126-185)/(56-104) 162/73 mmHg (11/02 0742) SpO2:  [93 %-99 %] 96 % (11/02 0742) Weight:  [69.809 kg (153 lb 14.4 oz)] 69.809 kg (153 lb 14.4 oz) (11/01 2149) Weight change: 1.043 kg (2 lb 4.8 oz)  Intake/Output from previous day: 11/01 0701 - 11/02 0700 In: 720 [P.O.:120; I.V.:250; Blood:350] Out: 3525 [Urine:3525] Intake/Output this shift: Total I/O In: 240 [P.O.:240] Out: -   General appearance: alert, cooperative and pale Resp: clear to auscultation bilaterally Cardio: S1, S2 normal and systolic murmur: holosystolic 2/6, blowing at apex GI: pos bs, soft, liver down2 cm Extremities: extremities normal, atraumatic, no cyanosis or edema  Lab Results:  Basename 06/21/12 0600 06/20/12 0625  WBC 6.7 6.3  HGB 9.6* 7.4*  HCT 28.1* 22.1*  PLT 75* 75*   BMET:  Basename 06/21/12 0600 06/20/12 0625  NA 141 141  K 3.5 3.8  CL 104 108  CO2 21 19  GLUCOSE 88 86  BUN 22 24*  CREATININE 2.87* 2.89*  CALCIUM 9.0 8.7    Basename 06/19/12 1054  PTH 21.0   Iron Studies:  Basename 06/18/12 1441  IRON 32*  TIBC 214*  TRANSFERRIN --  FERRITIN 308    Studies/Results: Ct Humerus Right Wo Contrast  06/20/2012  *RADIOLOGY REPORT*  Clinical Data: Multiple lytic osseous lesions on abdominal pelvic CT.  Evaluate for lytic humeral lesion.  CT OF THE RIGHT HUMERUS WITHOUT CONTRAST  Technique:  Multidetector CT imaging was performed according to the standard protocol. Multiplanar CT image reconstructions were also generated.  Comparison: Abdominal pelvic CT 06/18/2012.  Whole body bone scan 06/19/2012 and bone survey 06/19/2012.  Findings: Several small lytic lesions are suspected.  These are not as destructive as the dominant lytic lesion within the right iliac  bone.  However, within the proximal humeral metaphysis, there is a 2.3 cm lucent lesion with probable mild endosteal thinning anteriorly near the bicipital groove.  There is a possible subtle intramedullary lesion in the proximal humeral diaphysis also causing mild endosteal thinning.  Several tiny lytic lesions are present within the humeral head, scapular glenoid and proximal radius.  No pathologic fracture or soft tissue mass is seen.  No definite rib lesion or pathologic fractures seen.  There are a few small nodules within the visualized right lung, the largest measuring 4 mm in the upper lobe on image 78.  IMPRESSION: As demonstrated on prior studies, there are several lytic lesions within the right humerus.  In addition, there are small lesions in the proximal right radius and scapula.  These are nonspecific in etiology; myeloma remains the primary concern.  No pathologic fractures are identified.  The lesion in the right anterior iliac crest remains the most accessible for percutaneous biopsy.   Original Report Authenticated By: Carey Bullocks, M.D.    Nm Bone Scan Whole Body  06/19/2012  *RADIOLOGY REPORT*  Clinical Data: Weakness.  CT suggested multiple lytic lesions. Evaluate for bony metastatic disease versus myeloma.  NUCLEAR MEDICINE WHOLE BODY BONE SCINTIGRAPHY  Technique:  Whole body anterior and posterior images were obtained approximately 3 hours after intravenous injection of radiopharmaceutical.  Radiopharmaceutical: CURIE TC-MDP TECHNETIUM TC 8M MEDRONATE IV KIT  Comparison: 06/18/2012 CT.  No comparison bone scan.  Findings: CT detected lytic lesions with  largest involving the anterior aspect of the right ilium.  On the bone scan, fairly symmetric distribution of radiotracer of the ilium without asymmetric increased uptake within the right ilium as may be expected if this representing metastatic disease.  Metastatic disease not excluded although myeloma is of higher consideration  given the lack of bone scan findings when compared to the CT findings.  The right iliac lesion may represent the easiest lesion to the biopsy.  Correlation with laboratory results also recommended.  Low-level increased radiotracer uptake throughout the femur bilaterally greater on the left and involving the proximal and mid humerus bilaterally.  Etiology indeterminate.  Metabolic abnormality not excluded.  Heterogeneous distribution to the kidneys consistent with presence of renal cyst.  IMPRESSION: Abnormal CT scan and lack of significant focal radiotracer uptake on the current bone scan raises possibility of myeloma.  Laboratory correlation and possible aspirate of the right iliac bony destructive lesion may be considered for further delineation.  The increased uptake involving the femur and humerus bilaterally may reflect changes of metabolic abnormality although tumor not entirely excluded  This has been made a PRA call report utilizing dashboard call feature.   Original Report Authenticated By: Lacy Duverney, M.D.    US Renal  06/19/2012  *RADIOLOGY REPORT*  Clinical Data: Acute renal insufficiency.  RENAL/URINARY TRACT ULTRASOUND COMPLETE  Comparison:  CT scan dated 06/18/2012  Findings:  Right Kidney:  13.9 cm in length.  6.7 cm simple cyst on the lower pole.  15 mm cyst more medially on the lower pole and 13 mm cyst more medially in the midportion of the kidney.  Renal parenchyma is increased in echogenicity consistent with renal medical disease. No hydronephrosis.  Left Kidney:  12.8 cm in length.  Simple appearing 5 cm cyst on the upper pole.  Increased echogenicity of the renal parenchyma suggesting renal medical disease.  No hydronephrosis.  Bladder:  Prostate gland is prominent, measuring 5.3 x 4.5 x 4.9 cm.  Bladder appears normal.  IMPRESSION:  1.  Increased echogenicity of the renal parenchyma bilaterally consistent with renal medical disease. 2.  Benign-appearing cysts on both kidneys.  No renal  obstruction. 3.  Slightly enlarged prostate gland.   Original Report Authenticated By: Francene Boyers, M.D.    Dg Bone Survey Met  06/19/2012  *RADIOLOGY REPORT*  Clinical Data: Lytic lesions.  METASTATIC BONE SURVEY  Comparison: CT of the abdomen pelvis 06/18/2012.  Findings:  The following bones were evaluated:  Lateral skull: Small lytic lesion in the parietal region.  Cervical spine, AP and lateral views: Degenerative disc disease and facet disease.  No visible focal lytic lesion or acute bony abnormality.  Thoracic spine, AP and lateral views: The lytic lesions previously seen on CT are not well visualized by plain films.  Normal alignment.  No acute bony abnormality.  Lumbar spine, AP and lateral views: The previously seen lytic lesions by CT are not well visualized by plain films.  Normal alignment.  Degenerative facet disease.  No acute findings.  Pelvis, AP view: Lucency projects over the right iliac bone in the region of the anterior superior iliac spine as well as in the superior right iliac crest.  These were seen on prior CT.  Left iliac lesion also likely visualize but difficult to separate from adjacent bowel gas.  Bilateral femora, AP views: Lytic lesions seen on CT difficult to visualize by plain film.  Mild degenerative changes in the left hip.  Bilateral shoulders, AP views: No visible focal lytic  lesion.  No acute bony abnormality.  Bilateral humeri, AP views:  No visible focal lytic lesion.  No acute bony abnormality.  IMPRESSION:  The only lytic lesions appreciable by plain films are in the calvarium and right hemi pelvis.  The previously seen lytic lesions within the spine and femurs on CT are not visible by plain films.   Original Report Authenticated By: Charlett Nose, M.D.     I have reviewed the patient's current medications.  Assessment/Plan: 1 AKI may not resolve with MM dz.  Will F/U out patient with tx 2 HTN 3 Anemia P F/u outpatient    LOS: 3 days   Sarie Stall  L 06/21/2012,9:32 AM

## 2012-06-21 NOTE — Discharge Summary (Signed)
Trevor Michael, Trevor Michael               ACCOUNT NO.:  [id number]  MEDICAL RECORD NO.:  [id number]  LOCATION:  6707                         FACILITY:  [facility]  PHYSICIAN:  [name], MD  DATE OF BIRTH:  08/29/1940  DATE OF ADMISSION:  06/18/2012 DATE OF DISCHARGE:  06/21/2012                              DISCHARGE SUMMARY   ADMITTING DIAGNOSES: 1. Anemia. 2. Acute renal insufficiency. 3. Abdominal discomfort.  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  CONSULTANTS:  James L. Deterding, MD, for Nephrology.  Dr. Samul Dada, MD, for Oncology.  PROCEDURES: 1. CT scan of the abdomen and pelvis on the day of admission which     showed multiple lytic bony lesions, highly suspicious for     metastatic disease.  Largest lesion with cortical destruction in     the anterior aspect of right iliac measuring 3 cm.  No evidence of     diverticulitis or colitis.  No hydronephrosis or hydroureter.  No     aortic aneurysm.  No small bowel or colonic obstruction.  Enlarged     prostate gland with indentation of the urinary bladder is noted.     Small right hydrocele is noted. 2. Bone survey which showed lytic lesions in the calvarium and right     hemipelvis.  Previously seen lytic lesions within the spine and     femur on CT are not visible on plain films.  Shoulder showed no     visible lytic lesions.  Bilateral humeri with no visible lytic     lesions, although there is a lytic lesion on CT. 3. Nuclear medicine bone scan.  Final impression is abnormal CT and     lack of significant focal radiotracer uptake on current bone scan     raises possibility of myeloma.  Laboratory correlation is     recommended.  Increased uptake involving the femur and humerus     bilaterally may reflect changes of metabolic abnormality, although     tumor not entirely excluded. 4. Renal ultrasound which showed increased echogenicity of the renal     parenchyma bilaterally  consistent with renal medical disease.     Benign appearing cysts on both kidneys with no renal obstruction.     Slightly enlarged prostate is noted. 5. CT of the humerus which showed several lytic lesions within the     right humerus.  There is small lesions in the proximal right radius     and scapula.  No pathologic fractures are identified.  CRITICAL LABORATORY DATA:  Iron studies revealed a slightly low iron at 32 with iron percent saturation of 15%.  Markedly elevated beta 2 microglobulin is noted at 5.89, kappa free light chain is low at 0.03. B12 was normal at 1315.  Serum proteins, alpha protein is low, beta globulin is markedly elevated at 51.1, gamma spike is 4%, light chain with lambda free light chain markedly elevated at 157, on repeat 133. Urine protein electrophoresis with a total protein is elevated at 537, free lambda light chains markedly elevated at 529. EGD performed by Dr. Elnoria Howard on November 1 was a negative study with no sign  of gastritis or ulceration.  HISTORY OF PRESENT ILLNESS:  Trevor Michael is a delightful 71 year old African American gentleman who previously enjoyed good health.  He has been followed by Dr. Isabel Michael for mildly elevated PSA and enlarged prostate gland.  He had been seen in May for a normal physical exam. For several days prior to admission, the patient had been feeling generally ill and had some mild abdominal discomfort.  Because of his persistent weakness and ill feeling, he presented to urgent care.  The examination was unremarkable, but initial labs were abnormal with anemia and acute renal insufficiency.  The patient was referred to [facility] where he was seen in the emergency department and subsequently admitted for his anemia, chronic renal insufficiency, and lytic lesions that were noted on CT scan as outlined above. Please see the H and P and previous epic records for past medical history, family history, and social  history.  HOSPITAL COURSE: 1. Anemia.  The patient had a presenting hemoglobin of 8.2 g, which     deteriorated to 7.4 g.  He had no signs of active blood loss.  GI     saw the patient in consultation and did perform an EGD which was     negative.  It was felt the patient had a basic hematologic disease,     probable multiple myeloma, and this was the reason for his anemia.     The patient was transfused 2 units of packed red cells with a rise     in hemoglobin to 9.6  at the time of discharge.  Plan, the patient     will be followed by Hematology.  We will follow his hemoglobins and     try to maintain hemoglobin at least greater than 9. 2. Renal.  The patient with acute renal insufficiency.  The patient     has had renal ultrasound that was unrevealing.  The patient had no     underlying inflammatory disease, no obstruction.  The leading     diagnosis was myeloma kidney.  The patient had serial creatinine     which showed slight improvement with a creatinine dropping from a     high of 3.06 to 2.87 at the time of discharge.  The patient did     have good urine output.  Plan, the patient will have outpatient followup laboratory.  He will see Nephrology if he has progressive renal impairment.  3. Hematology.  The patient with anemia,lytic  bone lesions, acute renal     insufficiency, abnormal protein analysis of both urine and blood,     all consistent with multiple myeloma.  The patient was seen in     consultation by Dr. Johney Frame.  The patient is scheduled to have     a bone marrow aspiration and biopsy on Monday, November 4 at 11     a.m. and he will be presenting as outpatient at 9:00 a.m. that     Monday.  The patient will be seeing Dr. Arline Asp in followup in     regards to active chemotherapeutic treatment for what is most likely     multiple myeloma.  The patient has been fully informed in regard to     his working diagnosis.  He is referred to [email or web address] so he can      seek out more information. 4. Elevated uric acid.  As part of his routine laboratory evaluation,     the patient was found to have  an elevated uric acid at     approximately 10.8.  The patient has never had any flares of gout.     Given the adverse consequences of untreated elevated uric acid, he     is started on allopurinol which he will continue as an outpatient.  With the inpatient diagnostic evaluation being completed, with the patient being stable hemodynamically and with transfusion being completed, the patient is now ready for discharge to home.  He will return for bone marrow aspiration and biopsy as noted above.  He will have close followup with laboratory at [facility] on Monday 4th and subsequently as needed.  He will be seeing Dr. Johney Frame in followup as results become available and treatment planning can begin.  DISCHARGE EXAMINATION:  VITAL SIGNS:  Temperature was 98.1, blood pressure 162/73, pulse 68, respirations 18, oxygen saturations 96% on room air. GENERAL APPEARANCE:  This is a slender African American gentleman looking younger than his stated chronologic age, who is in no acute distress.  HEENT:  Conjunctivae and sclerae were clear.  Pupils were equal, round, and reactive. PULMONARY:  The patient has normal respirations with no increased work of breathing. CARDIOVASCULAR:  The patient has a regular rate and rhythm.  No further examination conducted.  FINAL LABORATORY:  From November 2; sodium 141, potassium 3.5, chloride 104, CO2 of 21, BUN of 22, creatinine 2.87, glucose was 88.  Phosphorus was 4.  Albumin was 2.5, LDH was 198.  The patient had normal CK total at the time of admission.  Troponin at the time of admission was negative.  Iron studies with iron of 32.  TIBC of 214, iron saturation of 15, folate was 18.1.  Details on protein studies are above.  Final CBC from November 2 with a white count of 6700, hemoglobin 9.6 g, platelet count of  75,000.  Last differential was from the 30th which with a normal differential with 74% segs, 14% lymphs, 11% monos.  The patient had a reticulocyte percent of 1% which is normal range.  Manual reticulocyte count was 24.2.  DISPOSITION:  The patient was discharged home with close outpatient followup.  The patient's condition at time of discharge dictation is hemodynamically and medically stable.    Rosalyn Gess Norins, MD     MEN/MEDQ  D:  06/21/2012  T:  06/21/2012  Job:  [id number]  cc:   Fayrene Fearing L. Deterding, M.D. Samul Dada, M.D.

## 2012-06-21 NOTE — Progress Notes (Signed)
Subjective: [name] is feeling well, has tolerated diet. He is ready for d/c home  Objective: Lab: Lab Results  Component Value Date   WBC 6.7 06/21/2012   HGB 9.6* 06/21/2012   HCT 28.1* 06/21/2012   MCV 93.7 06/21/2012   PLT 75* 06/21/2012   BMET    Component Value Date/Time   NA 141 06/21/2012 0600   K 3.5 06/21/2012 0600   CL 104 06/21/2012 0600   CO2 21 06/21/2012 0600   GLUCOSE 88 06/21/2012 0600   BUN 22 06/21/2012 0600   CREATININE 2.87* 06/21/2012 0600   CALCIUM 9.0 06/21/2012 0600   GFRNONAA 21* 06/21/2012 0600   GFRAA 24* 06/21/2012 0600     Imaging:  Scheduled Meds:   . allopurinol  100 mg Oral Daily  . amLODipine  5 mg Oral QHS  . bimatoprost  1 drop Both Eyes QHS  . brimonidine  1 drop Both Eyes BID   And  . timolol  1 drop Both Eyes BID  . furosemide  20 mg Intravenous Once  . pantoprazole (PROTONIX) IV  40 mg Intravenous Q12H  . sodium bicarbonate  1,300 mg Oral BID  . sodium chloride  3 mL Intravenous Q12H  . DISCONTD: allopurinol  100 mg Oral Daily   Continuous Infusions:   . DISCONTD: 0.9 % NaCl with KCl 20 mEq / L 100 mL/hr (06/19/12 1806)   PRN Meds:.acetaminophen, acetaminophen, levalbuterol, ondansetron (ZOFRAN) IV, ondansetron, DISCONTD: fentaNYL, DISCONTD: midazolam   Physical Exam: Filed Vitals:   06/21/12 0742  BP: 162/73  Pulse: 68  Temp: 98.1 F (36.7 C)  Resp: 18        Assessment/Plan: D/c home - note dictated #[id number] 60 minutes arranging discharge plans and discharging patient  [name] [facility] (o) [phone]; (c) [phone] Call-grp - [facility] Tele: [phone]  06/21/2012, 9:02 AM

## 2012-06-22 ENCOUNTER — Other Ambulatory Visit: Payer: Self-pay | Admitting: Radiology

## 2012-06-23 ENCOUNTER — Ambulatory Visit (HOSPITAL_COMMUNITY): Admit: 2012-06-23 | Payer: Medicare Other

## 2012-06-23 ENCOUNTER — Other Ambulatory Visit: Payer: Self-pay | Admitting: Radiology

## 2012-06-23 ENCOUNTER — Ambulatory Visit (HOSPITAL_COMMUNITY)
Admit: 2012-06-23 | Discharge: 2012-06-23 | Disposition: A | Discharge status: Home / Self Care | Payer: Medicare Other | Attending: Oncology | Admitting: Oncology

## 2012-06-23 ENCOUNTER — Other Ambulatory Visit (INDEPENDENT_AMBULATORY_CARE_PROVIDER_SITE_OTHER): Payer: Medicare Other

## 2012-06-23 ENCOUNTER — Encounter (HOSPITAL_COMMUNITY): Payer: Self-pay | Admitting: Gastroenterology

## 2012-06-23 ENCOUNTER — Other Ambulatory Visit: Payer: Self-pay | Admitting: Oncology

## 2012-06-23 ENCOUNTER — Other Ambulatory Visit: Payer: Self-pay | Admitting: Internal Medicine

## 2012-06-23 ENCOUNTER — Telehealth: Payer: Self-pay | Admitting: Oncology

## 2012-06-23 DIAGNOSIS — N179 Acute kidney failure, unspecified: Secondary | ICD-10-CM

## 2012-06-23 DIAGNOSIS — C9000 Multiple myeloma not having achieved remission: Secondary | ICD-10-CM

## 2012-06-23 DIAGNOSIS — Z538 Procedure and treatment not carried out for other reasons: Secondary | ICD-10-CM | POA: Insufficient documentation

## 2012-06-23 LAB — COMPREHENSIVE METABOLIC PANEL
ALT: 19 U/L (ref 0–53)
Albumin: 3 g/dL — ABNORMAL LOW (ref 3.5–5.2)
Alkaline Phosphatase: 44 U/L (ref 39–117)
CO2: 29 mEq/L (ref 19–32)
Glucose, Bld: 96 mg/dL (ref 70–99)
Potassium: 3.9 mEq/L (ref 3.5–5.1)
Sodium: 139 mEq/L (ref 135–145)
Total Protein: 9.2 g/dL — ABNORMAL HIGH (ref 6.0–8.3)

## 2012-06-23 LAB — IGG, IGA, IGM: IgG (Immunoglobin G), Serum: 156 mg/dL — ABNORMAL LOW (ref 650–1600)

## 2012-06-23 LAB — CBC WITH DIFFERENTIAL/PLATELET
Basophils Absolute: 0 10*3/uL (ref 0.0–0.1)
Eosinophils Relative: 1.9 % (ref 0.0–5.0)
Monocytes Absolute: 0.9 10*3/uL (ref 0.1–1.0)
Monocytes Relative: 14.7 % — ABNORMAL HIGH (ref 3.0–12.0)
Neutrophils Relative %: 70.4 % (ref 43.0–77.0)
Platelets: 104 10*3/uL — ABNORMAL LOW (ref 150.0–400.0)
WBC: 6.1 10*3/uL (ref 4.5–10.5)

## 2012-06-23 LAB — CBC
MCHC: 33.6 g/dL (ref 30.0–36.0)
RDW: 15.9 % — ABNORMAL HIGH (ref 11.5–15.5)

## 2012-06-23 LAB — UIFE/LIGHT CHAINS/TP QN, 24-HR UR
Free Kappa Lt Chains,Ur: 0.94 mg/dL (ref 0.14–2.42)
Free Kappa/Lambda Ratio: 0 ratio — ABNORMAL LOW (ref 2.04–10.37)
Total Protein, Urine: 507.6 mg/dL

## 2012-06-23 LAB — PROTEIN ELECTROPH W RFLX QUANT IMMUNOGLOBULINS
Beta Globulin: 49.5 % — ABNORMAL HIGH (ref 4.7–7.2)
Gamma Globulin: 1.4 % — ABNORMAL LOW (ref 11.1–18.8)
M-Spike, %: 4.01 g/dL
Total Protein ELP: 8.9 g/dL — ABNORMAL HIGH (ref 6.0–8.3)

## 2012-06-23 LAB — APTT: aPTT: 31 seconds (ref 24–37)

## 2012-06-23 LAB — IMMUNOFIXATION ADD-ON

## 2012-06-23 LAB — PROTIME-INR: INR: 1.22 (ref 0.00–1.49)

## 2012-06-23 MED ORDER — SODIUM CHLORIDE 0.9 % IV SOLN
INTRAVENOUS | Status: DC
  Administered 2012-06-23: 11:00:00 via INTRAVENOUS

## 2012-06-23 NOTE — Telephone Encounter (Deleted)
C/D 06/23/12 for appt. 06/25/12

## 2012-06-23 NOTE — Telephone Encounter (Signed)
C/D 06/23/12 for appt. 06/25/12 °

## 2012-06-23 NOTE — H&P (Signed)
Agree 

## 2012-06-23 NOTE — Progress Notes (Signed)
Pt stated ate breakfast this morning @ 0850.  Pam, PA notified will reschedule, d/c iv pt may leave.

## 2012-06-23 NOTE — H&P (Signed)
Trevor Michael is an 71 y.o. male.   Chief Complaint: pt was hospitalized 06/18/12 for weakness and pain; work up revealed many bony lytic lesions in pelvic and shoulder dc'd 11/2 and now back for outpt bone marrow biopsy No cancer hx. HPI: HTN; HLD; glaucoma; GERD: poss MM  Past Medical History  Diagnosis Date  . Other and unspecified hyperlipidemia   . Essential hypertension, benign   . BPH (benign prostatic hyperplasia)   . Early stage glaucoma   . Heart murmur     "since my teens" (06/18/2012)  . Cervical stenosis of spine   . GERD (gastroesophageal reflux disease)     Past Surgical History  Procedure Date  . Lipoma excision 07/1986    left shoulder/scapula  . Esophagogastroduodenoscopy 06/20/2012    Procedure: ESOPHAGOGASTRODUODENOSCOPY (EGD);  Surgeon: Theda Belfast, MD;  Location: [facility] ENDOSCOPY;  Service: Endoscopy;  Laterality: N/A;    Family History  Problem Relation Age of Onset  . Benign prostatic hyperplasia Father   . Pneumonia Father   . Coronary artery disease Father     PTCA  . Heart disease Brother     CABG, CAD,  . Benign prostatic hyperplasia Brother    Social History:  reports that he has never smoked. He has never used smokeless tobacco. He reports that he does not drink alcohol or use illicit drugs.  Allergies: No Known Allergies   (Not in a hospital admission)  No results found for this or any previous visit (from the past 48 hour(s)). No results found.  Review of Systems  Constitutional: Positive for weight loss. Negative for fever.  Respiratory: Negative for shortness of breath.   Cardiovascular: Negative for chest pain.  Gastrointestinal: Negative for nausea, vomiting and abdominal pain.  Musculoskeletal: Positive for joint pain.  Psychiatric/Behavioral: The patient is nervous/anxious.     Blood pressure 168/80, pulse 67, temperature 97.6 F (36.4 C), temperature source Oral, resp. rate 18, height 5\' 9"  (1.753 m), weight 154 lb  (69.854 kg), SpO2 100.00%. Physical Exam  Constitutional: He is oriented to person, place, and time. He appears well-developed and well-nourished.  Cardiovascular: Normal rate, regular rhythm and normal heart sounds.   No murmur heard. Respiratory: Effort normal and breath sounds normal. He has no wheezes.  GI: Soft. Bowel sounds are normal. There is no tenderness.  Musculoskeletal: Normal range of motion.  Neurological: He is alert and oriented to person, place, and time.  Psychiatric: He has a normal mood and affect. His behavior is normal. Judgment and thought content normal.     Assessment/Plan Bony lytic lesions of pelvis and shoulder. Wt loss and pain No ca hx Scheduled for BM BX to r/o Multiple myeloma Pt and wife aware of procedure benefits and risks and agreeable to proceed. Consent signed and in chart  Lysbeth Dicola A 06/23/2012, 10:33 AM

## 2012-06-23 NOTE — Telephone Encounter (Signed)
S/W pt in re [place] F/U appt 11/06 @ 9:30 w/Dr. Arline Asp Welcome packet mailed.

## 2012-06-24 ENCOUNTER — Ambulatory Visit (HOSPITAL_COMMUNITY)
Admission: RE | Admit: 2012-06-24 | Discharge: 2012-06-24 | Disposition: A | Discharge status: Home / Self Care | Payer: Medicare Other | Source: Ambulatory Visit | Attending: Oncology | Admitting: Oncology

## 2012-06-24 ENCOUNTER — Inpatient Hospital Stay (HOSPITAL_COMMUNITY)
Admission: RE | Admit: 2012-06-24 | Discharge: 2012-06-24 | Disposition: A | Discharge status: Home / Self Care | Payer: Medicare Other | Source: Ambulatory Visit

## 2012-06-24 DIAGNOSIS — C9000 Multiple myeloma not having achieved remission: Secondary | ICD-10-CM | POA: Insufficient documentation

## 2012-06-24 LAB — UIFE/LIGHT CHAINS/TP QN, 24-HR UR
Alpha 1, Urine: DETECTED — AB
Beta, Urine: DETECTED — AB
Free Kappa Lt Chains,Ur: 0.47 mg/dL (ref 0.14–2.42)
Gamma Globulin, Urine: DETECTED — AB

## 2012-06-24 LAB — CBC
HCT: 32 % — ABNORMAL LOW (ref 39.0–52.0)
Hemoglobin: 10.8 g/dL — ABNORMAL LOW (ref 13.0–17.0)
MCH: 31.8 pg (ref 26.0–34.0)
MCV: 94.1 fL (ref 78.0–100.0)
Platelets: 119 10*3/uL — ABNORMAL LOW (ref 150–400)
RBC: 3.4 MIL/uL — ABNORMAL LOW (ref 4.22–5.81)

## 2012-06-24 LAB — H.PYLORI ANTIGEN, STOOL

## 2012-06-24 MED ORDER — FENTANYL CITRATE 0.05 MG/ML IJ SOLN
INTRAMUSCULAR | Status: AC
  Filled 2012-06-24: qty 6

## 2012-06-24 MED ORDER — MIDAZOLAM HCL 2 MG/2ML IJ SOLN
INTRAMUSCULAR | Status: AC | PRN
  Administered 2012-06-24: 0.5 mg via INTRAVENOUS
  Administered 2012-06-24: 2 mg via INTRAVENOUS

## 2012-06-24 MED ORDER — SODIUM CHLORIDE 0.9 % IV SOLN: INTRAVENOUS | Status: DC

## 2012-06-24 MED ORDER — MIDAZOLAM HCL 2 MG/2ML IJ SOLN
INTRAMUSCULAR | Status: AC
  Filled 2012-06-24: qty 6

## 2012-06-24 MED ORDER — FENTANYL CITRATE 0.05 MG/ML IJ SOLN
INTRAMUSCULAR | Status: AC | PRN
  Administered 2012-06-24: 100 ug via INTRAVENOUS
  Administered 2012-06-24: 50 ug via INTRAVENOUS

## 2012-06-24 NOTE — Procedures (Signed)
BM Bx No comp 

## 2012-06-25 ENCOUNTER — Other Ambulatory Visit: Payer: Self-pay | Admitting: Medical Oncology

## 2012-06-25 ENCOUNTER — Encounter: Payer: Self-pay | Admitting: Oncology

## 2012-06-25 ENCOUNTER — Other Ambulatory Visit (HOSPITAL_BASED_OUTPATIENT_CLINIC_OR_DEPARTMENT_OTHER): Payer: Medicare Other | Admitting: Lab

## 2012-06-25 ENCOUNTER — Ambulatory Visit: Payer: Medicare Other

## 2012-06-25 ENCOUNTER — Telehealth: Payer: Self-pay | Admitting: *Deleted

## 2012-06-25 ENCOUNTER — Encounter: Payer: Self-pay | Admitting: Internal Medicine

## 2012-06-25 ENCOUNTER — Ambulatory Visit (HOSPITAL_BASED_OUTPATIENT_CLINIC_OR_DEPARTMENT_OTHER): Payer: Medicare Other | Admitting: Oncology

## 2012-06-25 VITALS — BP 161/80 | HR 66 | Temp 97.5°F | Resp 18 | Ht 69.0 in | Wt 152.9 lb

## 2012-06-25 DIAGNOSIS — C9000 Multiple myeloma not having achieved remission: Secondary | ICD-10-CM

## 2012-06-25 DIAGNOSIS — N289 Disorder of kidney and ureter, unspecified: Secondary | ICD-10-CM

## 2012-06-25 DIAGNOSIS — R630 Anorexia: Secondary | ICD-10-CM

## 2012-06-25 HISTORY — DX: Anorexia: R63.0

## 2012-06-25 LAB — CBC WITH DIFFERENTIAL/PLATELET
BASO%: 0.1 % (ref 0.0–2.0)
Eosinophils Absolute: 0.1 10*3/uL (ref 0.0–0.5)
HCT: 30.9 % — ABNORMAL LOW (ref 38.4–49.9)
HGB: 10.7 g/dL — ABNORMAL LOW (ref 13.0–17.1)
LYMPH%: 16.4 % (ref 14.0–49.0)
MONO#: 0.7 10*3/uL (ref 0.1–0.9)
NEUT#: 4.6 10*3/uL (ref 1.5–6.5)
NEUT%: 70.5 % (ref 39.0–75.0)
Platelets: 109 10*3/uL — ABNORMAL LOW (ref 140–400)
WBC: 6.5 10*3/uL (ref 4.0–10.3)
lymph#: 1.1 10*3/uL (ref 0.9–3.3)

## 2012-06-25 LAB — COMPREHENSIVE METABOLIC PANEL (CC13)
ALT: 21 U/L (ref 0–55)
AST: 38 U/L — ABNORMAL HIGH (ref 5–34)
CO2: 30 mEq/L — ABNORMAL HIGH (ref 22–29)
Calcium: 9.8 mg/dL (ref 8.4–10.4)
Chloride: 100 mEq/L (ref 98–107)
Creatinine: 2.2 mg/dL — ABNORMAL HIGH (ref 0.7–1.3)
Sodium: 140 mEq/L (ref 136–145)
Total Bilirubin: 0.39 mg/dL (ref 0.20–1.20)
Total Protein: 10.2 g/dL — ABNORMAL HIGH (ref 6.4–8.3)

## 2012-06-25 LAB — LACTATE DEHYDROGENASE (CC13): LDH: 232 U/L — ABNORMAL HIGH (ref 125–220)

## 2012-06-25 LAB — IMMUNOFIXATION ELECTROPHORESIS
IgG (Immunoglobin G), Serum: 129 mg/dL — ABNORMAL LOW (ref 650–1600)
Total Protein ELP: 7.9 g/dL (ref 6.0–8.3)

## 2012-06-25 LAB — PROTEIN / CREATININE RATIO, URINE
Protein Creatinine Ratio: 1.9 — ABNORMAL HIGH (ref <0.15)
Total Protein, Urine: 161 mg/dL

## 2012-06-25 MED ORDER — ACYCLOVIR 400 MG PO TABS: 400.0000 mg | ORAL_TABLET | Freq: Two times a day (BID) | ORAL | Status: DC

## 2012-06-25 MED ORDER — ONDANSETRON HCL 8 MG PO TABS: 8.0000 mg | ORAL_TABLET | Freq: Three times a day (TID) | ORAL | Status: DC | PRN

## 2012-06-25 MED ORDER — PROCHLORPERAZINE MALEATE 10 MG PO TABS: 10.0000 mg | ORAL_TABLET | Freq: Four times a day (QID) | ORAL | Status: DC | PRN

## 2012-06-25 NOTE — Telephone Encounter (Signed)
Gave pt appt for lab and MD, emailed Marcelino Duster regarding chemo for November 2013

## 2012-06-25 NOTE — Telephone Encounter (Signed)
Left message on home # voice mail of lab results and to call back if any questions

## 2012-06-25 NOTE — Progress Notes (Signed)
Checked in new pt with no financial concerns. °

## 2012-06-25 NOTE — Progress Notes (Signed)
This office note has been dictated.  #[id number]

## 2012-06-25 NOTE — Telephone Encounter (Signed)
Message copied by Elnora Morrison on Wed Jun 25, 2012 11:26 AM ------      Message from: [name] E      Created: Wed Jun 25, 2012  3:44 AM       Call patient - Hgb is 10.4 - improved since hospital d/c. Creatinine 2.4 - improved since hospital d/c. Letter also sent and copied Deterding and [place]

## 2012-06-26 ENCOUNTER — Telehealth: Payer: Self-pay | Admitting: Oncology

## 2012-06-26 ENCOUNTER — Encounter: Payer: Self-pay | Admitting: *Deleted

## 2012-06-26 ENCOUNTER — Other Ambulatory Visit: Payer: Self-pay | Admitting: Oncology

## 2012-06-26 ENCOUNTER — Encounter: Payer: Self-pay | Admitting: Oncology

## 2012-06-26 ENCOUNTER — Other Ambulatory Visit: Payer: Medicare Other

## 2012-06-26 DIAGNOSIS — C9000 Multiple myeloma not having achieved remission: Secondary | ICD-10-CM

## 2012-06-26 NOTE — Telephone Encounter (Signed)
gv pt appt schedule for November and December including appt w/dr. Roselind Messier.

## 2012-06-26 NOTE — Progress Notes (Signed)
Patient will be starting chemotherapy with Velcade Cytoxan and Decadron on Tuesday, November 12. He will receive Velcade and Decadron on Friday, November 15 and will see me on Tuesday, November 19. The dose of Cytoxan has been reduced to 400 mg total due to renal insufficiency. Patient also has mild thrombocytopenia. We're still awaiting the results of his bone marrow.  I spoke with Dr. Roselind Messier about my concerns regarding the right humerus. The patient will be receiving radiation to that area. We will need to arrange an appointment with Dr.Gioffre.

## 2012-06-26 NOTE — Progress Notes (Signed)
CC:   [name], MD [name], M.D. [name], Ph.D., M.D. [name], M.D. [name], MD [name], MD  PROBLEM LIST: 1. IgA lambda multiple myeloma diagnosed in late October 2013.  The     patient has anemia, thrombocytopenia, multiple bone lesions, renal     insufficiency, and monoclonal proteins in serum and urine.     Bone marrow exam was carried out on 06/24/2012, the results of     which are pending.  A 24-hour urine collection is also pending. 2. Renal insufficiency. 3. 2.3-cm lytic lesion in the right humerus at risk for pathologic     fracture. 4. Hypertension. 5. Benign prostate hypertrophy and elevated prostate specific antigen,     under the care of Dr. [name].  The patient had a prostate     biopsy in 2008 that was benign. 6. Dyslipidemia. 7. Arthritis involving the cervical spine 8. Glaucoma.  MEDICATIONS: 1. Allopurinol 100 mg daily. 2. Norvasc 5 mg daily. 3. Tenoretic 50-25 one tablet daily. 4. B complex vitamins 1 tablet daily. 5. Lumigan 0.03% ophthalmic drops 1 drop into both eyes at bedtime. 6. Combigan 0.2-0.5% ophthalmic solution 1 drop into both eyes twice     daily. 7. Nexium 40 mg daily. 8. Zocor 20 mg at bedtime. 9. Sodium bicarbonate 1300 mg twice daily. 10.Thiamin 100 mg daily. 11.Acyclovir 400 mg twice daily. 12.Compazine 10 mg every 6 hours as needed. 13.Zofran 8 mg every 8 hours as needed. 14.Chemotherapy with Velcade, Cytoxan and Decadron starting 07/01/2012.  SMOKING HISTORY:  The patient has never smoked cigarettes.  HISTORY:  I am seeing Trevor Michael today for the first time since his hospitalization at [facility] from 06/18/2012 through 06/21/2012. We saw Trevor Michael in consultation on 06/19/2012.  Trevor Michael is accompanied by his wife, Trevor Michael.  Trevor Michael underwent bone marrow aspirate and biopsy yesterday by Dr. Bonnielee Haff.  The results are pending.  We do have, however,  extensive information regarding Trevor Michael which establishes the diagnosis of IgA lambda multiple myeloma.  The patient does have significant proteinuria and had significant renal impairment.  At the time of admission to the hospital the patient's creatinine was 3.06 with a BUN of 30.  His hemoglobin got down to 7.4 and he required 2 units of packed red cells. His platelet count got down to 75,000 on 06/20/2012.  The patient has asymptomatic bony lesions.  He had an elevated total protein and gamma globulin.  His albumin on 06/20/2012 was 2.4.  His beta 2 microglobulin was 5.89, and thus he has stage III disease.  Uric acid was 10.8 and the patient was started on allopurinol 100 mg daily.  His calcium was not elevated.  The patient presented with about a 47-month history of anorexia, some nausea, but no vomiting.  Over the past 6 months he has lost about 7-8 pounds.  He was having some queasiness in his abdomen.  He did undergo an EGD by Dr. Elnoria Howard on 06/20/2012.  This was essentially negative.  He had rather extensive imaging studies which showed asymptomatic bone lesions and essentially negative bone scan.  There was a suggestion of a significant lytic lesion in the shaft of the right humerus and a CT scan of the humerus confirmed this.  I got an informal opinion from Dr. Worthy Rancher.  He did not think that the humerus needed pinning, but he suggested a radiation therapy evaluation.  I have spoken  with Dr. Antony Blackbird today and he will be looking at the CT scan to determine whether the patient should undergo radiation treatments to the right humerus. There is a 2.3-cm lesion with some mild endosteal thinning, as well as some other lytic lesions that are relatively small.  The patient was seen in consultation by Dr. Fayrene Fearing Deterding.  A renal ultrasound was carried out on 06/19/2012, showing increased echogenicity of the renal parenchyma bilaterally consistent with renal  medical disease.  There was no evidence for obstruction.  Fortunately, the patient's creatinine has improved.  As stated, the patient is here today with his wife, Trevor Michael.  He was discharged from the hospital on Saturday, November 2nd.  He is feeling much improved.  He still has some anorexia, but seems to be eating well. He denies any pain.  No nausea, vomiting, or abdominal pain.  No shortness of breath.  He really is without any major complaints today.  PHYSICAL EXAM:  General:  Trevor Michael is a generally well-appearing, perhaps somewhat thin, 71 year old gentleman with a weight of 152.9 pounds, height 5 feet 9 inches, body surface area 1.84 sq m.  Vital Signs:  Blood pressure 161/80.  Other vital signs are normal.  O2 saturation on room air at rest was 98%.  HEENT:  No scleral icterus. There is left conjunctival injection.  Mouth and pharynx benign.  No peripheral adenopathy palpable.  Heart and Lungs:  Normal.  I did not appreciate a systolic ejection murmur that has apparently been noted in the patient's record.  Back:  No skeletal tenderness.  Abdomen:  Benign, although a soft liver edge could be felt about 4 cm below the right costal margin.  Abdomen is nontender, otherwise unremarkable.  Skin: The patient had a few small areas of vitiligo on the back.  Lymph nodes: No axillary or inguinal adenopathy.  Extremities:  No peripheral edema, clubbing, petechiae, or purpura, palmar erythema.  Neurologic:  Normal. Musculoskeletal:  There are no areas of bony tenderness, particularly no areas of pain or tenderness involving the right humerus or right upper extremity.  LABORATORY DATA:  Today white count 6.5, ANC 4.6, hemoglobin 10.7, hematocrit 30.9, platelets 109,000.  Chemistries today notable for a BUN of 20, creatinine 2.2 as compared with 3.06 on 06/19/2012.  Albumin 3.2, total protein 10.2, and thus globulins are 7.0.  Calcium 9.8, LDH 232. Uric acid is pending.  Uric acid on  06/19/2012 was 10.8.  IgA level on 06/19/2012 was 6320, although another value on that same day was 5100 and on 06/20/2012 5170.  The IgG level was low at 129 and the IgM level was unmeasurable.  Serum lambda free light chains were 157 and the kappa free light chain was 0.03.  The serum protein electrophoresis showed an M spike of 4.01 g/dL.  The serum and urine immunofixation electrophoresis showed monoclonal IgA heavy chains with lambda light chains and excess monoclonal free lambda light chains.  A 24-hour urine collection apparently was not done.  We have a random urine that showed 537 mg/dL and free lambda light chains markedly in excess of kappa light chains.  Beta 2 microglobulin on 06/19/2012 was 5.89 and thus, the patient qualifies as stage III disease.  H pylori from the stool was negative.  IMAGING STUDIES: 1. MRI of the cervical spine without IV contrast on 11/07/2009 showed     disk degeneration and spondylosis, most severe at C5-C6 where there     is mild to moderate spinal stenosis.  There is foraminal     encroachment at this level, left greater than right.  There were     degenerative changes at other levels which were described in detail     in the body of the report. 2. Chest x-ray, 2 view, from 11/16/2009 showed no acute findings. 3. CT abdomen and pelvis without IV contrast on 06/18/2012 showed     multiple lytic bone lesions highly suspicious for metastatic     disease or multiple myeloma.  The largest lesion seen with some     cortical destruction in the anterior aspect of the iliac bone     measured 3 cm.  There was no evidence of colitis or diverticulitis.     There was no evidence for hydronephrosis or hydroureter.  There     were bilateral probable renal cysts.  There was an enlarged     prostate gland with indentation of the urinary bladder base.  There     was a small right hydrocele. 4. Metastatic bone survey from 06/19/2012 showed lytic lesions in the      calvaria and right hemipelvis and a possible lesion in the right     humerus.  This latter lesion was not specifically mentioned in the     report. 5. Nuclear bone scan on 06/19/2012 showed some increased uptake     involving the femur and humerus bilaterally, possibly reflecting     changes of metabolic bone disease.  There was a lack of correlation     with the bone scan and the findings seen on CT and x-rays, raising     the possibility of multiple myeloma. 6. Renal ultrasound on 06/19/2012 showed increased echogenicity of the     renal parenchyma bilaterally consistent with renal medical disease.     There was no evidence for obstruction.  There was a slightly     enlarged prostate gland. 7. CT scan of the right humerus without IV contrast on 06/20/2012     showed 2.3-cm lucent lesion with probable mild endosteal thinning     anteriorly near the bicipital groove.  There were several small     lytic lesions present within the humeral head, scapula, glenoid,     and proximal radius.  No pathologic fracture or soft tissue mass     was seen.  There were a few small nodules within the visualized     right lung, the largest of which was 4 mm in the upper lobe on     image 78. 8. CT-guided bone marrow aspirate and biopsy was carried out on     06/24/2012.  IMPRESSION AND PLAN:  Today's session was rather extended, lasting approximately 2 hours.  All of the patient's information from his recent hospitalization was carefully reviewed.  The patient was given copies of his imaging reports.  In carefully reviewing the patient's records, we could see that a 24-hour urine protein collection had not been carried out, although this was ordered.  I saw a couple of cancellations in Epic.  I think this is an important baseline and therefore we are going to have the patient undergo a 24-hour urine collection for protein and a creatinine clearance.  Fortunately, his renal function has improved.   We are awaiting the results of the bone marrow aspirate and biopsy. Frankly, I would be surprised if the bone marrow is not heavily involved with plasma cell myeloma.  The patient is really not having any major symptomatology at this  time, just some anorexia and some mild lack of energy.  He apparently is sleeping during the afternoons more than he used to.  He is not having any bone pain.  His renal function has improved.  I am most concerned about the 2.3-cm lytic lesion in the right humerus.  I have had Dr. Darrelyn Hillock review this and I have asked Dr. Roselind Messier to review it.  The patient may need radiation treatments to the right humerus to decrease his risk for having a pathologic fracture.  The patient will be scheduled for chemotherapy education class.  I am anticipating treating him with Cytoxan, bortezomib (Velcade), and Decadron twice a week.  The Velcade will be given subcutaneously. The patient will need a chemotherapy education class.  I would like to get him started on treatment early next week, somewhere around November 12th.  We will check a CBC and chemistries on that date.  Hopefully, we will have the 24-hour urine collection.  The patient will return  On 07/04/12 for a second treatment, and then we will see him on November 19th, at which time we will check CBC, chemistries including LDH, uric acid, and an IgA level.  We will be following the IgA level along with his urine studies fairly closely.  The patient thinks that he had a pneumonia shot back about a year ago in 2012.  We strongly recommended that he have a flu shot in view of the compromised immune status associated with multiple myeloma.  The patient was urged to follow up with Dr. Darrick Penna regarding his renal disease. We talked about the possibility of high-dose therapy and stem cell rescue at a tertiary center.  The patient is 71 years old and even though his health had previously been quite good, certainly his  risk would be increased and thus he may not be a candidate for high-dose therapy on the basis of his age.  This will need to be determined in the months ahead, depending on how we responds to treatment and how he tolerates Treatment. He may be a candidate for Zometa for his bone disease at some point if his renal function improves.  He will also need dental clearance for Zometa.  Again, extensive records were reviewed and we had a rather extended discussion with the patient and his wife.  We will go forward as outlined above.    ______________________________ Samul Dada, M.D. DSM/MEDQ  D:  06/25/2012  T:  06/26/2012  Job:  [id number]

## 2012-06-27 ENCOUNTER — Other Ambulatory Visit: Payer: Medicare Other

## 2012-06-27 DIAGNOSIS — C9000 Multiple myeloma not having achieved remission: Secondary | ICD-10-CM

## 2012-06-29 ENCOUNTER — Other Ambulatory Visit: Payer: Self-pay | Admitting: Internal Medicine

## 2012-07-01 ENCOUNTER — Encounter: Payer: Self-pay | Admitting: Oncology

## 2012-07-01 ENCOUNTER — Other Ambulatory Visit (HOSPITAL_BASED_OUTPATIENT_CLINIC_OR_DEPARTMENT_OTHER): Payer: Medicare Other

## 2012-07-01 ENCOUNTER — Ambulatory Visit: Payer: Medicare Other | Admitting: Nutrition

## 2012-07-01 ENCOUNTER — Ambulatory Visit (HOSPITAL_BASED_OUTPATIENT_CLINIC_OR_DEPARTMENT_OTHER): Payer: Medicare Other

## 2012-07-01 ENCOUNTER — Other Ambulatory Visit: Payer: Self-pay

## 2012-07-01 ENCOUNTER — Telehealth: Payer: Self-pay | Admitting: *Deleted

## 2012-07-01 VITALS — BP 121/61 | HR 58 | Temp 97.7°F | Resp 20

## 2012-07-01 DIAGNOSIS — Z9221 Personal history of antineoplastic chemotherapy: Secondary | ICD-10-CM

## 2012-07-01 DIAGNOSIS — C9000 Multiple myeloma not having achieved remission: Secondary | ICD-10-CM

## 2012-07-01 DIAGNOSIS — Z5111 Encounter for antineoplastic chemotherapy: Secondary | ICD-10-CM

## 2012-07-01 DIAGNOSIS — Z5112 Encounter for antineoplastic immunotherapy: Secondary | ICD-10-CM

## 2012-07-01 HISTORY — DX: Personal history of antineoplastic chemotherapy: Z92.21

## 2012-07-01 LAB — CREATININE CLEARANCE, URINE, 24 HOUR
Collection Interval-CRCL: 24 hours
Creatinine Clearance: 68 mL/min — ABNORMAL LOW (ref 75–125)
Creatinine, 24H Ur: 1838 mg/d (ref 800–2000)
Urine Total Volume-CRCL: 2150 mL

## 2012-07-01 LAB — COMPREHENSIVE METABOLIC PANEL (CC13)
Albumin: 3.2 g/dL — ABNORMAL LOW (ref 3.5–5.0)
Alkaline Phosphatase: 59 U/L (ref 40–150)
BUN: 22 mg/dL (ref 7.0–26.0)
Creatinine: 1.6 mg/dL — ABNORMAL HIGH (ref 0.7–1.3)
Glucose: 95 mg/dl (ref 70–99)
Potassium: 3.5 mEq/L (ref 3.5–5.1)

## 2012-07-01 LAB — UIFE/LIGHT CHAINS/TP QN, 24-HR UR
Alpha 2, Urine: DETECTED — AB
Beta, Urine: DETECTED — AB
Free Kappa Lt Chains,Ur: 1.17 mg/dL (ref 0.14–2.42)
Free Lambda Lt Chains,Ur: 788 mg/dL — ABNORMAL HIGH (ref 0.02–0.67)
Free Lt Chn Excr Rate: 25.16 mg/d
Volume, Urine: 2150 mL

## 2012-07-01 LAB — CBC WITH DIFFERENTIAL/PLATELET
BASO%: 0.4 % (ref 0.0–2.0)
Basophils Absolute: 0 10*3/uL (ref 0.0–0.1)
EOS%: 1.4 % (ref 0.0–7.0)
HCT: 33.2 % — ABNORMAL LOW (ref 38.4–49.9)
HGB: 11.1 g/dL — ABNORMAL LOW (ref 13.0–17.1)
LYMPH%: 26 % (ref 14.0–49.0)
MCH: 31.7 pg (ref 27.2–33.4)
MCHC: 33.4 g/dL (ref 32.0–36.0)
MCV: 94.9 fL (ref 79.3–98.0)
MONO%: 13.9 % (ref 0.0–14.0)
NEUT%: 58.3 % (ref 39.0–75.0)
Platelets: 114 10*3/uL — ABNORMAL LOW (ref 140–400)
lymph#: 1.4 10*3/uL (ref 0.9–3.3)

## 2012-07-01 MED ORDER — SODIUM CHLORIDE 0.9 % IV SOLN
400.0000 mg | Freq: Once | INTRAVENOUS | Status: AC
  Administered 2012-07-01: 400 mg via INTRAVENOUS
  Filled 2012-07-01: qty 20

## 2012-07-01 MED ORDER — DEXAMETHASONE SODIUM PHOSPHATE 4 MG/ML IJ SOLN
20.0000 mg | Freq: Once | INTRAMUSCULAR | Status: AC
  Administered 2012-07-01: 20 mg via INTRAVENOUS

## 2012-07-01 MED ORDER — SODIUM CHLORIDE 0.9 % IV SOLN
INTRAVENOUS | Status: DC
  Administered 2012-07-01: 10:00:00 via INTRAVENOUS

## 2012-07-01 MED ORDER — ONDANSETRON HCL 8 MG PO TABS
8.0000 mg | ORAL_TABLET | Freq: Once | ORAL | Status: AC
  Administered 2012-07-01: 8 mg via ORAL

## 2012-07-01 MED ORDER — BORTEZOMIB CHEMO SQ INJECTION 3.5 MG (2.5MG/ML)
1.3000 mg/m2 | Freq: Once | INTRAMUSCULAR | Status: AC
  Administered 2012-07-01: 2.5 mg via SUBCUTANEOUS
  Filled 2012-07-01: qty 2.5

## 2012-07-01 NOTE — Patient Instructions (Addendum)
[facility] Discharge Instructions for Patients Receiving Chemotherapy  Today you received the following chemotherapy agents Cytoxan, Velcade, Decadron  To help prevent nausea and vomiting after your treatment, we encourage you to take your nausea medication as directed by your MD.If you develop nausea and vomiting that is not controlled by your nausea medication, call the clinic. If it is after clinic hours your family physician or the after hours number for the clinic or go to the Emergency Department.   BELOW ARE SYMPTOMS THAT SHOULD BE REPORTED IMMEDIATELY:  *FEVER GREATER THAN 100.5 F  *CHILLS WITH OR WITHOUT FEVER  NAUSEA AND VOMITING THAT IS NOT CONTROLLED WITH YOUR NAUSEA MEDICATION  *UNUSUAL SHORTNESS OF BREATH  *UNUSUAL BRUISING OR BLEEDING  TENDERNESS IN MOUTH AND THROAT WITH OR WITHOUT PRESENCE OF ULCERS  *URINARY PROBLEMS  *BOWEL PROBLEMS  UNUSUAL RASH Items with * indicate a potential emergency and should be followed up as soon as possible.  One of the nurses will contact you 24 hours after your treatment. Please let the nurse know about any problems that you may have experienced. Feel free to call the clinic you have any questions or concerns. The clinic phone number is [phone].

## 2012-07-01 NOTE — Progress Notes (Signed)
24-hour urine from 06/27/2012 yielded 17.1 g of protein. Urine immunofixation electrophoresis showed a monoclonal IgA heavy chain with associated lambda light chains and excess monoclonal free lambda light chains.  Creatinine clearance was 68 ML's per minute. Serum creatinine was 1.88.  Free lambda light chain urinary excretion was 16.9 g per day.

## 2012-07-01 NOTE — Progress Notes (Signed)
This is a 71 year old male patient of Dr. Arline Asp diagnosed with multiple myeloma in October 2013. Patient requesting nutrition consult today.  Past medical history includes hyperlipidemia, hypertension, BPH, heart murmur, GERD, renal insufficiency, and arthritis.  Medications include B complex, Nexium, Zofran, Compazine, Zocor, and vitamin B1.  Labs include BUN of 20, creatinine 2.2, albumin 3.2.  Height: 69 inches. Weight: 152.9 pounds. Usual body weight 153 pounds July 2013. BMI: 22.57.  Patient and wife are interested in nutrition information for patient while undergoing treatment. He does have anorexia and is concerned that he will have taste alterations. He has tried and enjoyed boost. He understands he needs to maintain his weight.  Nutrition diagnosis: Food and nutrition related knowledge deficit related to diagnosis of multiple myeloma and associated treatments as evidenced by no prior need for nutrition related information.  Intervention: I educated patient and wife on the importance of small frequent meals with a protein source at each meal or snack. I have reviewed appropriate protein sources for patient to consume. We have discussed adding oral nutrition supplements when patient is unable to consume foods. I've also educated patient and wife on strategies for dealing with potential taste alterations. I provided fact sheets for patient to take with him today.  Monitoring, evaluation, goals: Patient will tolerate oral diet throughout treatments to promote weight maintenance and minimize side effects.  Next visit: Tuesday, December 3, during chemotherapy.

## 2012-07-01 NOTE — Telephone Encounter (Signed)
Called patient at home and the patient stated that he seen the barb neff

## 2012-07-01 NOTE — Progress Notes (Signed)
Bone marrow carried out on 06/24/2012 was hypercellular and showed extensive atypical plasmacytosis as seen by morphology and immunohistochemical stains. The plasma cells were lambda light chain restricted. By morphology, plasma cells were 48%.  Examination of the peripheral blood smear showed rouleaux formation. Scattered plasmacytoid cells were present. Platelets were decreased in number.  Cytogenetic analysis revealed the presence of normal male chromosomes with no observable clonal chromosomal abnormalities.  There was a single cell with additional chromosome material on the 3q, loss of chromosome 13 and a marker chromosome. This was considered a single cell abnormality and had not been specifically associated with any leukemia subtype.  DNA probe panel disclosed a gain of chromosome 11 which is consistent with abnormalities associated with multiple myeloma.

## 2012-07-02 ENCOUNTER — Encounter: Payer: Self-pay | Admitting: *Deleted

## 2012-07-02 ENCOUNTER — Telehealth: Payer: Self-pay | Admitting: *Deleted

## 2012-07-02 DIAGNOSIS — C419 Malignant neoplasm of bone and articular cartilage, unspecified: Secondary | ICD-10-CM | POA: Insufficient documentation

## 2012-07-02 NOTE — Telephone Encounter (Signed)
Message copied by Augusto Garbe on Wed Jul 02, 2012 10:58 AM ------      Message from: Faith Rogue F      Created: Tue Jul 01, 2012 10:53 AM      Regarding: chemo f/u call       Velcade (SQ), Cytoxan, Decadron            Dr. Arline Asp            Home number is ok

## 2012-07-02 NOTE — Telephone Encounter (Signed)
Mr. Linch is doing well.  Reports trouble sleeping last nigh t.  No interruption in his day to day activity,.  Eating and drinking well with no complaints and denies a reaction.  Asked how to reach a provider after hours.  Reviewed this and he verbalized understanding.  Also reviewed next appointments.  No further questions.

## 2012-07-03 ENCOUNTER — Ambulatory Visit
Admission: RE | Admit: 2012-07-03 | Discharge: 2012-07-03 | Disposition: A | Discharge status: Home / Self Care | Payer: Medicare Other | Source: Ambulatory Visit | Attending: Radiation Oncology | Admitting: Radiation Oncology

## 2012-07-03 ENCOUNTER — Encounter: Payer: Self-pay | Admitting: Radiation Oncology

## 2012-07-03 VITALS — BP 159/72 | HR 66 | Temp 97.9°F | Wt 152.1 lb

## 2012-07-03 DIAGNOSIS — I129 Hypertensive chronic kidney disease with stage 1 through stage 4 chronic kidney disease, or unspecified chronic kidney disease: Secondary | ICD-10-CM | POA: Insufficient documentation

## 2012-07-03 DIAGNOSIS — N189 Chronic kidney disease, unspecified: Secondary | ICD-10-CM | POA: Insufficient documentation

## 2012-07-03 DIAGNOSIS — Z79899 Other long term (current) drug therapy: Secondary | ICD-10-CM | POA: Insufficient documentation

## 2012-07-03 DIAGNOSIS — K219 Gastro-esophageal reflux disease without esophagitis: Secondary | ICD-10-CM | POA: Insufficient documentation

## 2012-07-03 DIAGNOSIS — C9000 Multiple myeloma not having achieved remission: Secondary | ICD-10-CM

## 2012-07-03 DIAGNOSIS — H409 Unspecified glaucoma: Secondary | ICD-10-CM | POA: Insufficient documentation

## 2012-07-03 DIAGNOSIS — E785 Hyperlipidemia, unspecified: Secondary | ICD-10-CM | POA: Insufficient documentation

## 2012-07-03 NOTE — Progress Notes (Signed)
Radiation Oncology         [phone] ________________________________  Initial outpatient Consultation  Name: Trevor Michael MRN: [id number]  Date: 07/03/2012  DOB: 1940/11/12  WG:NFAOZHY Norins, MD  Murinson, Gerarda Fraction, MD   REFERRING PHYSICIAN: Murinson, Gerarda Fraction, MD  DIAGNOSIS: The encounter diagnosis was Multiple myeloma. IgA lambda   HISTORY OF PRESENT ILLNESS::Trevor Michael is a 71 y.o. male who is  seen out of the courtesy of Dr. Roe Coombs Murinson for an opinion concerning radiation therapy as part of management of patient's recently diagnosed multiple myeloma. Earlier this year the patient presented with some weight loss fatigue in general ill feelings.  Blood work showed anemia as well as renal dysfunction. Patient was  admitted for evaluation of this issue. Patient was found to have multiple myeloma.  A bone survey was performed which revealed a significant lytic defect in the right humerus.  Patient was seen by orthopedic surgery who did not recommend pinning of this area however recommended consideration for radiation oncology consultation.   PREVIOUS RADIATION THERAPY: No  PAST MEDICAL HISTORY:  has a past medical history of Other and unspecified hyperlipidemia; Essential hypertension, benign; BPH (benign prostatic hyperplasia); Early stage glaucoma; Heart murmur; Cervical stenosis of spine; GERD (gastroesophageal reflux disease); Bone cancer; Spinal stenosis; Chronic kidney disease; Arthritis; DDD (degenerative disc disease); Cancer (05/2012); Anorexia (06/25/12); Diverticulitis; and History of chemotherapy (07/01/12).    PAST SURGICAL HISTORY: Past Surgical History  Procedure Date  . Lipoma excision 07/1986    left shoulder/scapula  . Esophagogastroduodenoscopy 06/20/2012    Procedure: ESOPHAGOGASTRODUODENOSCOPY (EGD);  Surgeon: Theda Belfast, MD;  Location: [facility] ENDOSCOPY;  Service: Endoscopy;  Laterality: N/A;  . Prostate biopsy 2008    benign    FAMILY HISTORY: family  history includes Benign prostatic hyperplasia in his brother and father; Coronary artery disease in his father; Heart disease in his brother; and Pneumonia in his father.  SOCIAL HISTORY:  reports that he has never smoked. He has never used smokeless tobacco. He reports that he does not drink alcohol or use illicit drugs.  ALLERGIES: Review of patient's allergies indicates no known allergies.  MEDICATIONS:  Current Outpatient Prescriptions  Medication Sig Dispense Refill  . acetaminophen (TYLENOL) 500 MG tablet Take 1,000 mg by mouth every 6 (six) hours as needed. For pain      . acyclovir (ZOVIRAX) 400 MG tablet Take 1 tablet (400 mg total) by mouth 2 (two) times daily.  60 tablet  6  . allopurinol (ZYLOPRIM) 100 MG tablet Take 1 tablet (100 mg total) by mouth daily.  30 tablet  11  . amLODipine (NORVASC) 5 MG tablet Take 5 mg by mouth at bedtime.      Marland Kitchen atenolol-chlorthalidone (TENORETIC) 50-25 MG per tablet TAKE ONE TABLET ONCE DAILY  30 tablet  5  . b complex vitamins tablet Take 1 tablet by mouth daily.        . bimatoprost (LUMIGAN) 0.03 % ophthalmic drops Place 1 drop into both eyes at bedtime.        . brimonidine-timolol (COMBIGAN) 0.2-0.5 % ophthalmic solution Place 1 drop into both eyes 2 (two) times daily.        Marland Kitchen esomeprazole (NEXIUM) 40 MG capsule Take 1 capsule (40 mg total) by mouth daily before breakfast.  30 capsule  11  . ondansetron (ZOFRAN) 8 MG tablet Take 1 tablet (8 mg total) by mouth every 8 (eight) hours as needed.  20 tablet  3  . prochlorperazine (  COMPAZINE) 10 MG tablet Take 1 tablet (10 mg total) by mouth every 6 (six) hours as needed.  30 tablet  3  . simvastatin (ZOCOR) 20 MG tablet Take 20 mg by mouth at bedtime.      . sodium bicarbonate 650 MG tablet Take 2 tablets (1,300 mg total) by mouth 2 (two) times daily.  120 tablet  5  . Thiamine HCl (VITAMIN B-1) 100 MG tablet Take 100 mg by mouth daily.        . clindamycin (CLEOCIN T) 1 % lotion         REVIEW  OF SYSTEMS:  A 15 point review of systems is documented in the electronic medical record. This was obtained by the nursing staff. However, I reviewed this with the patient to discuss relevant findings and make appropriate changes. He denies any pain in his right arm or problems with swelling. He denies any pain in his right pelvis area or other areas of his body. Patient denies any headaches dizziness or blurred vision.   PHYSICAL EXAM:  weight is 152 lb 1.6 oz (68.992 kg). His temperature is 97.9 F (36.6 C). His blood pressure is 159/72 and his pulse is 66.   BP 159/72  Pulse 66  Temp 97.9 F (36.6 C)  Wt 152 lb 1.6 oz (68.992 kg)  General Appearance:    Alert, cooperative, no distress, appears stated age  Head:    Normocephalic, without obvious abnormality, atraumatic  Eyes:    PERRL, conjunctiva/corneas clear, EOM's intact   , both eyes       Ears:    Normal TM's and external ear canals, both ears  Nose:   Nares normal, septum midline, mucosa normal, no drainage    or sinus tenderness  Throat:   Lips, mucosa, and tongue normal; teeth and gums normal  Neck:   Supple, symmetrical, trachea midline, no adenopathy;       thyroid:  No enlargement/tenderness/nodules; no carotid   bruit or JVD  Back:     Symmetric, no curvature, ROM normal, no CVA tenderness,scar along the left upper back  Lungs:     Clear to auscultation bilaterally, respirations unlabored  Chest wall:    No tenderness or deformity  Heart:    Regular rate and rhythm, no murmur, rub   or gallop  Abdomen:     Soft, non-tender, bowel sounds active all four quadrants,    no masses, no organomegaly        Extremities:   Extremities normal, atraumatic, no cyanosis or edema,no point tenderness along right upper arm or right pelvis  Pulses:   2+ and symmetric all extremities  Skin:   Skin color, texture, turgor normal, no rashes or lesions  Lymph nodes:   Cervical, supraclavicular, and axillary nodes normal  Neurologic:    CNII-XII intact. Normal strength, sensation and reflexes      throughout    LABORATORY DATA:  Lab Results  Component Value Date   WBC 5.5 07/01/2012   HGB 11.1* 07/01/2012   HCT 33.2* 07/01/2012   MCV 94.9 07/01/2012   PLT 114* 07/01/2012   Lab Results  Component Value Date   NA 140 07/01/2012   K 3.5 07/01/2012   CL 101 07/01/2012   CO2 26 07/01/2012   Lab Results  Component Value Date   ALT 18 07/01/2012   AST 28 07/01/2012   ALKPHOS 59 07/01/2012   BILITOT 0.39 07/01/2012     RADIOGRAPHY: Ct Abdomen Pelvis Wo Contrast  06/18/2012  *RADIOLOGY REPORT*  Clinical Data: Epigastric pain, rule out colitis  CT ABDOMEN AND PELVIS WITHOUT CONTRAST  Technique:  Multidetector CT imaging of the abdomen and pelvis was performed following the standard protocol without intravenous contrast.  Comparison: None.  Findings: Sagittal images of the spine shows a tiny lytic lesion T10 vertebral body.  There is a lytic lesion in the T11 vertebral body right posteriorly measures 1.2 cm.  A subtle small lytic lesion L1 vertebral body measures 6 mm.  There is a lytic lesion in the right iliac bone measures 1.2 cm.  A lytic destructive lesion in the right iliac bone anteriorly axial image 57 measures 3 cm. There is a lytic lesion in the left iliac bone at the level of the SI joint measures 9.6 mm.  Findings are highly suspicious for metastatic disease. Further evaluation with bone scan or MRI is recommended.  Lung bases are unremarkable.  Unenhanced liver is unremarkable. 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  Cystic lesion in the upper pole of the right kidney measures 6.5 cm probable a cyst. There is a probable cyst in the mid pole of the left kidney measures 5 cm.  Additional smaller low density lesions within the right kidney cannot be characterized without IV contrast.  No nephrolithiasis.  No hydronephrosis or hydroureter.  No calcified ureteral calculi are noted.  No aortic aneurysm.  Mild  atherosclerotic calcification of  proximal common iliac arteries.  Oral contrast material was given to the patient.  No small bowel obstruction.  No ascites or free air.  No adenopathy.  No pericecal inflammation.  Normal appendix is clearly visualized in axial image 55.  A distended urinary bladder is noted.  There is enlarged prostate gland with indentation of urinary bladder base.  Prostate gland measures 4.9 x 5.6 cm.  Further evaluation with urology exam is recommended.  Bilateral distal ureter is unremarkable.  Left pelvic phleboliths are noted.  Lytic lesions are noted bilateral proximal femur  A small right hydrocele is noted.  Small amount of pelvic free fluid noted within posterior pelvis.  No evidence of colitis or diverticulitis.   IMPRESSION:  1.  There are multiple lytic bony lesions as described above highly suspicious for metastatic disease.  The largest lesion with some cortical destruction in the anterior aspect right iliac bone measures 3 cm.  Further evaluation with bone scan or MRI is recommended.  2.  No evidence of colitis or diverticulitis. 3.  No hydronephrosis or hydroureter.  Bilateral probable renal cyst.  Smaller low density lesions within the right kidney cannot be characterized without IV contrast. 4.  No aortic aneurysm. 5.  No small bowel or colonic obstruction.  Normal appendix. 6.  There is enlarged prostate gland with indentation of urinary bladder base.  Correlation with urology exam is recommended.  A distended urinary bladder is noted. 7.  Small right hydrocele.   Original Report Authenticated By: Natasha Mead, M.D.    Ct Humerus Right Wo Contrast  06/20/2012  *RADIOLOGY REPORT*  Clinical Data: Multiple lytic osseous lesions on abdominal pelvic CT.  Evaluate for lytic humeral lesion.  CT OF THE RIGHT HUMERUS WITHOUT CONTRAST  Technique:  Multidetector CT imaging was performed according to the standard protocol. Multiplanar CT image reconstructions were also generated.   Comparison: Abdominal pelvic CT 06/18/2012.  Whole body bone scan 06/19/2012 and bone survey 06/19/2012.  Findings: Several small lytic lesions are suspected.  These are not as destructive as the dominant lytic lesion  within the right iliac bone.  However, within the proximal humeral metaphysis, there is a 2.3 cm lucent lesion with probable mild endosteal thinning anteriorly near the bicipital groove.  There is a possible subtle intramedullary lesion in the proximal humeral diaphysis also causing mild endosteal thinning.  Several tiny lytic lesions are present within the humeral head, scapular glenoid and proximal radius.  No pathologic fracture or soft tissue mass is seen.  No definite rib lesion or pathologic fractures seen.  There are a few small nodules within the visualized right lung, the largest measuring 4 mm in the upper lobe on image 78.  IMPRESSION: As demonstrated on prior studies, there are several lytic lesions within the right humerus.  In addition, there are small lesions in the proximal right radius and scapula.  These are nonspecific in etiology; myeloma remains the primary concern.  No pathologic fractures are identified.  The lesion in the right anterior iliac crest remains the most accessible for percutaneous biopsy.   Original Report Authenticated By: Carey Bullocks, M.D.    Nm Bone Scan Whole Body  06/19/2012  *RADIOLOGY REPORT*  Clinical Data: Weakness.  CT suggested multiple lytic lesions. Evaluate for bony metastatic disease versus myeloma.  NUCLEAR MEDICINE WHOLE BODY BONE SCINTIGRAPHY  Technique:  Whole body anterior and posterior images were obtained approximately 3 hours after intravenous injection of radiopharmaceutical.  Radiopharmaceutical: CURIE TC-MDP TECHNETIUM TC 18M MEDRONATE IV KIT  Comparison: 06/18/2012 CT.  No comparison bone scan.  Findings: CT detected lytic lesions with largest involving the anterior aspect of the right ilium.  On the bone scan, fairly  symmetric distribution of radiotracer of the ilium without asymmetric increased uptake within the right ilium as may be expected if this representing metastatic disease.  Metastatic disease not excluded although myeloma is of higher consideration given the lack of bone scan findings when compared to the CT findings.  The right iliac lesion may represent the easiest lesion to the biopsy.  Correlation with laboratory results also recommended.  Low-level increased radiotracer uptake throughout the femur bilaterally greater on the left and involving the proximal and mid humerus bilaterally.  Etiology indeterminate.  Metabolic abnormality not excluded.  Heterogeneous distribution to the kidneys consistent with presence of renal cyst.  IMPRESSION: Abnormal CT scan and lack of significant focal radiotracer uptake on the current bone scan raises possibility of myeloma.  Laboratory correlation and possible aspirate of the right iliac bony destructive lesion may be considered for further delineation.  The increased uptake involving the femur and humerus bilaterally may reflect changes of metabolic abnormality although tumor not entirely excluded  This has been made a PRA call report utilizing dashboard call feature.   Original Report Authenticated By: Lacy Duverney, M.D.    US Renal  06/19/2012  *RADIOLOGY REPORT*  Clinical Data: Acute renal insufficiency.  RENAL/URINARY TRACT ULTRASOUND COMPLETE  Comparison:  CT scan dated 06/18/2012  Findings:  Right Kidney:  13.9 cm in length.  6.7 cm simple cyst on the lower pole.  15 mm cyst more medially on the lower pole and 13 mm cyst more medially in the midportion of the kidney.  Renal parenchyma is increased in echogenicity consistent with renal medical disease. No hydronephrosis.  Left Kidney:  12.8 cm in length.  Simple appearing 5 cm cyst on the upper pole.  Increased echogenicity of the renal parenchyma suggesting renal medical disease.  No hydronephrosis.  Bladder:   Prostate gland is prominent, measuring 5.3 x 4.5 x 4.9 cm.  Bladder appears normal.  IMPRESSION:  1.  Increased echogenicity of the renal parenchyma bilaterally consistent with renal medical disease. 2.  Benign-appearing cysts on both kidneys.  No renal obstruction. 3.  Slightly enlarged prostate gland.   Original Report Authenticated By: Francene Boyers, M.D.    Ct Biopsy  06/24/2012  *RADIOLOGY REPORT*  Clinical Data/Indication: MULTIPLE MYELOMA  CT-GUIDED bone marrow aspirate and core biopsy.  Procedure: The procedure, risks, benefits, and alternatives were explained to the patient. Questions regarding the procedure were encouraged and answered. The patient understands and consents to the procedure.  The back was prepped with betadine in a sterile fashion, and a sterile drape was applied covering the operative field. A sterile gown and sterile gloves were used for the procedure.  Under CT guidance, 11 gauge needle was inserted into the right iliac bone via posterior approach.  Aspirates were obtained.  A core was obtained. Final imaging was performed.  Patient tolerated the procedure well without complication.  Vital sign monitoring by nursing staff during the procedure will continue as patient is in the special procedures unit for post procedure observation.  Findings: The images document guide needle placement within the right iliac bone.  Post biopsy images demonstrate no evidence of hemorrhage.  IMPRESSION: Successful CT-guided bone marrow aspirate and core.   Original Report Authenticated By: Jolaine Click, M.D.    Dg Bone Survey Met  06/19/2012  *RADIOLOGY REPORT*  Clinical Data: Lytic lesions.  METASTATIC BONE SURVEY  Comparison: CT of the abdomen pelvis 06/18/2012.  Findings:  The following bones were evaluated:  Lateral skull: Small lytic lesion in the parietal region.  Cervical spine, AP and lateral views: Degenerative disc disease and facet disease.  No visible focal lytic lesion or acute bony  abnormality.  Thoracic spine, AP and lateral views: The lytic lesions previously seen on CT are not well visualized by plain films.  Normal alignment.  No acute bony abnormality.  Lumbar spine, AP and lateral views: The previously seen lytic lesions by CT are not well visualized by plain films.  Normal alignment.  Degenerative facet disease.  No acute findings.  Pelvis, AP view: Lucency projects over the right iliac bone in the region of the anterior superior iliac spine as well as in the superior right iliac crest.  These were seen on prior CT.  Left iliac lesion also likely visualize but difficult to separate from adjacent bowel gas.  Bilateral femora, AP views: Lytic lesions seen on CT difficult to visualize by plain film.  Mild degenerative changes in the left hip.  Bilateral shoulders, AP views: No visible focal lytic lesion.  No acute bony abnormality.  Bilateral humeri, AP views:  No visible focal lytic lesion.  No acute bony abnormality.  IMPRESSION:  The only lytic lesions appreciable by plain films are in the calvarium and right hemi pelvis.  The previously seen lytic lesions within the spine and femurs on CT are not visible by plain films.   Original Report Authenticated By: Charlett Nose, M.D.       IMPRESSION: Multiple myeloma. The patient has started systemic therapy under the direction of Dr. Arline Asp and is tolerating this well.   I carefully reviewed the patient's imaging studies and I would be concerned about a fracture involving the right humerus with any stress to this area and therefore would recommend radiation therapy to this region. He also has a significant defect along the right iliac crest however is not having any pain in this area and  since this is not weightbearing I would not recommend radiation to this region unless he becomes symptomatic.  PLAN: The patient will return for simulation and planning early next week. Anticipate  10 treatments directed at the right humerus region. I  spent 60 minutes minutes face to face with the patient and more than 50% of that time was spent in counseling and/or coordination of care.   ------------------------------------------------  [name], PhD, MD

## 2012-07-03 NOTE — Progress Notes (Signed)
Patient and wife  here for radiation consultation of newly diagnosed multiple myeloma with right iliac bone involvement.Patient scheduled for 2nd chemotherapy on tomorrow.Curretnly has chemo on Tuesday or Friday.Patient very active.Denies pain.Informed of what this appointment consists of today.

## 2012-07-04 ENCOUNTER — Encounter (HOSPITAL_COMMUNITY): Payer: Self-pay

## 2012-07-04 ENCOUNTER — Emergency Department (INDEPENDENT_AMBULATORY_CARE_PROVIDER_SITE_OTHER)
Admission: EM | Admit: 2012-07-04 | Discharge: 2012-07-04 | Disposition: A | Discharge status: Home / Self Care | Payer: Medicare Other | Source: Home / Self Care | Attending: Emergency Medicine | Admitting: Emergency Medicine

## 2012-07-04 ENCOUNTER — Other Ambulatory Visit: Payer: Self-pay | Admitting: *Deleted

## 2012-07-04 ENCOUNTER — Ambulatory Visit (HOSPITAL_BASED_OUTPATIENT_CLINIC_OR_DEPARTMENT_OTHER): Payer: Medicare Other

## 2012-07-04 VITALS — BP 180/72 | HR 78 | Temp 97.6°F

## 2012-07-04 DIAGNOSIS — C9000 Multiple myeloma not having achieved remission: Secondary | ICD-10-CM

## 2012-07-04 DIAGNOSIS — R030 Elevated blood-pressure reading, without diagnosis of hypertension: Secondary | ICD-10-CM

## 2012-07-04 DIAGNOSIS — Z5112 Encounter for antineoplastic immunotherapy: Secondary | ICD-10-CM

## 2012-07-04 DIAGNOSIS — Z8679 Personal history of other diseases of the circulatory system: Secondary | ICD-10-CM

## 2012-07-04 MED ORDER — DEXAMETHASONE 4 MG PO TABS
20.0000 mg | ORAL_TABLET | Freq: Once | ORAL | Status: AC
  Administered 2012-07-04: 20 mg via ORAL

## 2012-07-04 MED ORDER — BORTEZOMIB CHEMO SQ INJECTION 3.5 MG (2.5MG/ML)
1.3000 mg/m2 | Freq: Once | INTRAMUSCULAR | Status: AC
  Administered 2012-07-04: 2.5 mg via SUBCUTANEOUS
  Filled 2012-07-04: qty 2.5

## 2012-07-04 NOTE — Telephone Encounter (Signed)
Patient wife calling in to ask for advice, states that patient was here today and was informed his blood pressure was elevated. Patient was to have it rechecked before leaving, but went to the bathroom and forgot to return to  staff to recheck it. Once home, he was concerned it was still elevated, he went to local drug store and checked on the machine there and got readings of 202/100 and 180/112. Wife then calling us to ask what to do now. Patient denies any symptoms such as headache, chest pain or palpitations. He states he feels fine. Patient has primary MD, Dr [name], and has not attempted to call them yet. After speaking to Dr Arline Asp, informed patient and spouse that although Dr Arline Asp is not terribly alarmed that these readings from the drug store machine are accurate, we do not want to disregard this or discourage him from seeking further medical attention, at a local Urgent Care Center or even the local fire dept to have first responders recheck his BP for possible more accurate reading and evaluation of this issue. Patient states he is only a few blocks from [facility] and will go there for further eval.  Explained to patient he should  also contact his primary MD for further suggestions for blood pressure medications. Note that patient is also on steroids with chemo treatments and this as well could cause some intermittent elevations in pressure. Patient verbalized understanding and states he would seek further evaluation tonight.

## 2012-07-04 NOTE — Patient Instructions (Signed)
[facility] Discharge Instructions for Patients Receiving Chemotherapy  Today you received the following chemotherapy agents: Velcade. To help prevent nausea and vomiting after your treatment, we encourage you to take your nausea medication.  If you develop nausea and vomiting that is not controlled by your nausea medication, call the clinic.   BELOW ARE SYMPTOMS THAT SHOULD BE REPORTED IMMEDIATELY:  *FEVER GREATER THAN 100.5 F  *CHILLS WITH OR WITHOUT FEVER  NAUSEA AND VOMITING THAT IS NOT CONTROLLED WITH YOUR NAUSEA MEDICATION  *UNUSUAL SHORTNESS OF BREATH  *UNUSUAL BRUISING OR BLEEDING  TENDERNESS IN MOUTH AND THROAT WITH OR WITHOUT PRESENCE OF ULCERS  *URINARY PROBLEMS  *BOWEL PROBLEMS  UNUSUAL RASH Items with * indicate a potential emergency and should be followed up as soon as possible.  Feel free to call the clinic you have any questions or concerns. The clinic phone number is [phone].    

## 2012-07-04 NOTE — ED Notes (Signed)
Concern for high reading of BP at drug store; was reportedly advised to come here for evaluation/check up by oncologist who scheduled his chemotherapy that was given earlier today. NAD, w/d/color good . In room for protection

## 2012-07-04 NOTE — ED Provider Notes (Signed)
History     CSN: [id number]  Arrival date & time 07/04/12  1658   None     Chief Complaint  Patient presents with  . Hypertension    (Consider location/radiation/quality/duration/timing/severity/associated sxs/prior treatment) Patient is a 71 y.o. male presenting with hypertension. The history is provided by the patient.  Hypertension This is a chronic problem. The current episode started 1 to 2 hours ago. The problem has been gradually improving. Pertinent negatives include no chest pain, no abdominal pain, no headaches and no shortness of breath. Nothing aggravates the symptoms. Nothing relieves the symptoms. He has tried nothing for the symptoms.  patient reports post chemotherapy treatment today he went to drug store for items and decided to check his blood pressure.  Noted to be elevated.   Called his oncologist and was instructed to present to urgent care for evaluation.  Denies cardiac, pulmonology or neurological complaints.  Past Medical History  Diagnosis Date  . Other and unspecified hyperlipidemia   . Essential hypertension, benign   . BPH (benign prostatic hyperplasia)   . Early stage glaucoma   . Heart murmur     "since my teens" (06/18/2012)  . Cervical stenosis of spine   . GERD (gastroesophageal reflux disease)   . Bone cancer     multiple bone lesion/right humerus  . Spinal stenosis   . Chronic kidney disease     renal insufficiency  . Arthritis   . DDD (degenerative disc disease)     c-5=-c6, and spondylosis  . Cancer 05/2012    IgA lambda multiple myeloma  . Anorexia 06/25/12    1 month hx   . Diverticulitis   . History of chemotherapy 07/01/12    Velcade,Cytoxin and decadron    Past Surgical History  Procedure Date  . Lipoma excision 07/1986    left shoulder/scapula  . Esophagogastroduodenoscopy 06/20/2012    Procedure: ESOPHAGOGASTRODUODENOSCOPY (EGD);  Surgeon: Theda Belfast, MD;  Location: [facility] ENDOSCOPY;  Service: Endoscopy;  Laterality: N/A;   . Prostate biopsy 2008    benign    Family History  Problem Relation Age of Onset  . Benign prostatic hyperplasia Father   . Pneumonia Father   . Coronary artery disease Father     PTCA  . Heart disease Brother     CABG, CAD,  . Benign prostatic hyperplasia Brother     History  Substance Use Topics  . Smoking status: Never Smoker   . Smokeless tobacco: Never Used  . Alcohol Use: No      Review of Systems  Respiratory: Negative for shortness of breath.   Cardiovascular: Negative for chest pain.  Gastrointestinal: Negative for abdominal pain.  Neurological: Negative for headaches.  All other systems reviewed and are negative.    Allergies  Review of patient's allergies indicates no known allergies.  Home Medications   Current Outpatient Rx  Name  Route  Sig  Dispense  Refill  . ACETAMINOPHEN 500 MG PO TABS   Oral   Take 1,000 mg by mouth every 6 (six) hours as needed. For pain         . ACYCLOVIR 400 MG PO TABS   Oral   Take 1 tablet (400 mg total) by mouth 2 (two) times daily.   60 tablet   6   . ALLOPURINOL 100 MG PO TABS   Oral   Take 1 tablet (100 mg total) by mouth daily.   30 tablet   11   . AMLODIPINE BESYLATE 5  MG PO TABS   Oral   Take 5 mg by mouth at bedtime.         . ATENOLOL-CHLORTHALIDONE 50-25 MG PO TABS      TAKE ONE TABLET ONCE DAILY   30 tablet   5   . B COMPLEX PO TABS   Oral   Take 1 tablet by mouth daily.           Marland Kitchen BIMATOPROST 0.03 % OP SOLN   Both Eyes   Place 1 drop into both eyes at bedtime.           Marland Kitchen BRIMONIDINE TARTRATE-TIMOLOL 0.2-0.5 % OP SOLN   Both Eyes   Place 1 drop into both eyes 2 (two) times daily.           Marland Kitchen CLINDAMYCIN PHOSPHATE 1 % EX LOTN               . ESOMEPRAZOLE MAGNESIUM 40 MG PO CPDR   Oral   Take 1 capsule (40 mg total) by mouth daily before breakfast.   30 capsule   11   . ONDANSETRON HCL 8 MG PO TABS   Oral   Take 1 tablet (8 mg total) by mouth every 8 (eight)  hours as needed.   20 tablet   3   . PROCHLORPERAZINE MALEATE 10 MG PO TABS   Oral   Take 1 tablet (10 mg total) by mouth every 6 (six) hours as needed.   30 tablet   3   . SIMVASTATIN 20 MG PO TABS   Oral   Take 20 mg by mouth at bedtime.         . SODIUM BICARBONATE 650 MG PO TABS   Oral   Take 2 tablets (1,300 mg total) by mouth 2 (two) times daily.   120 tablet   5   . VITAMIN B-1 100 MG PO TABS   Oral   Take 100 mg by mouth daily.             BP 158/63  Pulse 73  Temp 98.5 F (36.9 C) (Oral)  Resp 16  SpO2 100%  Physical Exam  Nursing note and vitals reviewed. Constitutional: He is oriented to person, place, and time. Vital signs are normal. He appears well-developed and well-nourished. He is active and cooperative.  HENT:  Head: Normocephalic.  Eyes: Conjunctivae normal are normal. Pupils are equal, round, and reactive to light. No scleral icterus.  Neck: Trachea normal and normal range of motion. Neck supple. No JVD present. Carotid bruit is not present.  Cardiovascular: Normal rate, regular rhythm, normal heart sounds, intact distal pulses and normal pulses.   Pulmonary/Chest: Effort normal and breath sounds normal.  Lymphadenopathy:    He has no cervical adenopathy.  Neurological: He is alert and oriented to person, place, and time. No cranial nerve deficit or sensory deficit.  Skin: Skin is warm and dry.  Psychiatric: He has a normal mood and affect. His speech is normal and behavior is normal. Judgment and thought content normal. Cognition and memory are normal.    ED Course  Procedures (including critical care time)  Labs Reviewed - No data to display No results found.   1. Hx of essential hypertension   2. Elevated blood pressure, situational       MDM  Follow up with primary care provider this week for hypertension management.        Johnsie Kindred, NP 07/04/12 1856  Johnsie Kindred,  NP 07/04/12 1858

## 2012-07-04 NOTE — ED Provider Notes (Signed)
Medical screening examination/treatment/procedure(s) were performed by non-physician practitioner and as supervising physician I was immediately available for consultation/collaboration.  Leslee Home, M.D.   Reuben Likes, MD 07/04/12 2128

## 2012-07-07 ENCOUNTER — Encounter: Payer: Self-pay | Admitting: Internal Medicine

## 2012-07-07 ENCOUNTER — Ambulatory Visit
Admission: RE | Admit: 2012-07-07 | Discharge: 2012-07-07 | Disposition: A | Discharge status: Home / Self Care | Payer: Medicare Other | Source: Ambulatory Visit | Attending: Radiation Oncology | Admitting: Radiation Oncology

## 2012-07-07 ENCOUNTER — Ambulatory Visit (INDEPENDENT_AMBULATORY_CARE_PROVIDER_SITE_OTHER): Payer: Medicare Other | Admitting: Internal Medicine

## 2012-07-07 VITALS — BP 138/64 | HR 61 | Temp 97.0°F | Resp 16 | Ht 69.0 in | Wt 150.2 lb

## 2012-07-07 DIAGNOSIS — I10 Essential (primary) hypertension: Secondary | ICD-10-CM

## 2012-07-07 DIAGNOSIS — Z510 Encounter for antineoplastic radiation therapy: Secondary | ICD-10-CM | POA: Insufficient documentation

## 2012-07-07 DIAGNOSIS — C9000 Multiple myeloma not having achieved remission: Secondary | ICD-10-CM

## 2012-07-07 DIAGNOSIS — N400 Enlarged prostate without lower urinary tract symptoms: Secondary | ICD-10-CM

## 2012-07-07 DIAGNOSIS — Z79899 Other long term (current) drug therapy: Secondary | ICD-10-CM | POA: Insufficient documentation

## 2012-07-07 DIAGNOSIS — Z23 Encounter for immunization: Secondary | ICD-10-CM

## 2012-07-07 MED ORDER — PNEUMOCOCCAL VAC POLYVALENT 25 MCG/0.5ML IJ INJ: 0.5000 mL | INJECTION | INTRAMUSCULAR | Status: DC

## 2012-07-07 NOTE — Patient Instructions (Addendum)
Thanks for coming in.  Blood pressure is good today. No change in medication  It sounds like your treatment for myeloma is going well.  Your kidney function has continued to improve. We follow the creatinine - higher is worse function, lower is better function. The creatinine got as high a 3.06 - 2.89 - 2.4 - 2.2 - 1.88 to 1.6 on NOv 15th. Normal is up to 1.5. A real improvement.  Increased nocturia (getting up at night) with slowed stream - most likely due to enlarged prostate and pressure on the urethra. Plan Trial fo Rapaflo, once a day in the evening, to "relax" the prostate and improve flow. You should see results in 2-3 days and if you don't please let me know.

## 2012-07-08 ENCOUNTER — Telehealth: Payer: Self-pay | Admitting: Oncology

## 2012-07-08 ENCOUNTER — Ambulatory Visit (HOSPITAL_BASED_OUTPATIENT_CLINIC_OR_DEPARTMENT_OTHER): Payer: Medicare Other | Admitting: Oncology

## 2012-07-08 ENCOUNTER — Encounter: Payer: Self-pay | Admitting: Oncology

## 2012-07-08 ENCOUNTER — Other Ambulatory Visit (HOSPITAL_BASED_OUTPATIENT_CLINIC_OR_DEPARTMENT_OTHER): Payer: Medicare Other | Admitting: Lab

## 2012-07-08 ENCOUNTER — Ambulatory Visit (HOSPITAL_BASED_OUTPATIENT_CLINIC_OR_DEPARTMENT_OTHER): Payer: Medicare Other

## 2012-07-08 VITALS — BP 150/70 | HR 67 | Temp 98.2°F | Resp 20 | Ht 69.0 in | Wt 149.7 lb

## 2012-07-08 DIAGNOSIS — C9000 Multiple myeloma not having achieved remission: Secondary | ICD-10-CM

## 2012-07-08 DIAGNOSIS — N289 Disorder of kidney and ureter, unspecified: Secondary | ICD-10-CM

## 2012-07-08 DIAGNOSIS — D492 Neoplasm of unspecified behavior of bone, soft tissue, and skin: Secondary | ICD-10-CM

## 2012-07-08 DIAGNOSIS — Z5112 Encounter for antineoplastic immunotherapy: Secondary | ICD-10-CM

## 2012-07-08 LAB — COMPREHENSIVE METABOLIC PANEL (CC13)
ALT: 15 U/L (ref 0–55)
AST: 34 U/L (ref 5–34)
Alkaline Phosphatase: 60 U/L (ref 40–150)
BUN: 22 mg/dL (ref 7.0–26.0)
Calcium: 8.9 mg/dL (ref 8.4–10.4)
Chloride: 96 mEq/L — ABNORMAL LOW (ref 98–107)
Creatinine: 1.1 mg/dL (ref 0.7–1.3)
Potassium: 3.3 mEq/L — ABNORMAL LOW (ref 3.5–5.1)

## 2012-07-08 LAB — CBC WITH DIFFERENTIAL/PLATELET
Basophils Absolute: 0 10*3/uL (ref 0.0–0.1)
EOS%: 0 % (ref 0.0–7.0)
Eosinophils Absolute: 0 10*3/uL (ref 0.0–0.5)
HGB: 10.2 g/dL — ABNORMAL LOW (ref 13.0–17.1)
NEUT#: 2.3 10*3/uL (ref 1.5–6.5)
RDW: 15.2 % — ABNORMAL HIGH (ref 11.0–14.6)
lymph#: 1 10*3/uL (ref 0.9–3.3)
nRBC: 0 % (ref 0–0)

## 2012-07-08 MED ORDER — SODIUM CHLORIDE 0.9 % IV SOLN
400.0000 mg | Freq: Once | INTRAVENOUS | Status: AC
  Administered 2012-07-08: 400 mg via INTRAVENOUS
  Filled 2012-07-08: qty 20

## 2012-07-08 MED ORDER — DEXAMETHASONE 4 MG PO TABS: 20.0000 mg | ORAL_TABLET | Freq: Once | ORAL | Status: DC

## 2012-07-08 MED ORDER — ONDANSETRON HCL 8 MG PO TABS: 8.0000 mg | ORAL_TABLET | Freq: Once | ORAL | Status: DC

## 2012-07-08 MED ORDER — ONDANSETRON 8 MG/50ML IVPB (CHCC)
8.0000 mg | Freq: Once | INTRAVENOUS | Status: AC
  Administered 2012-07-08: 8 mg via INTRAVENOUS

## 2012-07-08 MED ORDER — BORTEZOMIB CHEMO SQ INJECTION 3.5 MG (2.5MG/ML)
1.3000 mg/m2 | Freq: Once | INTRAMUSCULAR | Status: AC
  Administered 2012-07-08: 2.5 mg via SUBCUTANEOUS
  Filled 2012-07-08: qty 2.5

## 2012-07-08 MED ORDER — DEXAMETHASONE SODIUM PHOSPHATE 4 MG/ML IJ SOLN
20.0000 mg | Freq: Once | INTRAMUSCULAR | Status: AC
  Administered 2012-07-08: 20 mg via INTRAVENOUS

## 2012-07-08 NOTE — Assessment & Plan Note (Signed)
Anomalous elevation in BP transiently and now back baseline.  Plan - continue present medications

## 2012-07-08 NOTE — Patient Instructions (Addendum)
[facility] Discharge Instructions for Patients Receiving Chemotherapy  Today you received the following chemotherapy agents;  Velcade and Cytoxan.  To help prevent nausea and vomiting after your treatment, we encourage you to take your nausea medication ;  Zofran (ondansetron) and Compazine (prochloraperzine) as directed.   If you develop nausea and vomiting that is not controlled by your nausea medication, call the clinic. If it is after clinic hours your family physician or the after hours number for the clinic or go to the Emergency Department.   BELOW ARE SYMPTOMS THAT SHOULD BE REPORTED IMMEDIATELY:  *FEVER GREATER THAN 100.5 F  *CHILLS WITH OR WITHOUT FEVER  NAUSEA AND VOMITING THAT IS NOT CONTROLLED WITH YOUR NAUSEA MEDICATION  *UNUSUAL SHORTNESS OF BREATH  *UNUSUAL BRUISING OR BLEEDING  TENDERNESS IN MOUTH AND THROAT WITH OR WITHOUT PRESENCE OF ULCERS  *URINARY PROBLEMS  *BOWEL PROBLEMS  UNUSUAL RASH Items with * indicate a potential emergency and should be followed up as soon as possible.   Feel free to call the clinic you have any questions or concerns. The clinic phone number is [phone].   I have been informed and understand all the instructions given to me. I know to contact the clinic, my physician, or go to the Emergency Department if any problems should occur. I do not have any questions at this time, but understand that I may call the clinic during office hours   should I have any questions or need assistance in obtaining follow up care.    __________________________________________  _____________  __________ Signature of Patient or Authorized Representative            Date                   Time    __________________________________________ Nurse's Signature

## 2012-07-08 NOTE — Addendum Note (Signed)
Encounter addended by: Delynn Flavin, RN on: 07/08/2012  6:00 PM<BR>     Documentation filed: Charges VN

## 2012-07-08 NOTE — Progress Notes (Signed)
Subjective:    Patient ID: Trevor Michael, male    DOB: 1941-04-13, 71 y.o.   MRN: [id number]  HPI Trevor Michael has recently been diagnosed with multiple myeloma and has started chemotherapy under the direction of Dr. Arline Asp. He did have acute myeloma kidney but has had a steady improvement in renal function.   He presents today for follow up of BP. He had a significant excursion of his BP at the [facility] with SBP 180. Later in the day at Urgent Care his SBP was in the 150's. He was asymptomatic and since that time his blood pressure has been more in line with his usual readings.  He does report that he has new onset nocturia. He has been followed for BPH with elevated PSA. He does have a diminished force of stream but no other major signs of prostatism.  Past Medical History  Diagnosis Date  . Other and unspecified hyperlipidemia   . Essential hypertension, benign   . BPH (benign prostatic hyperplasia)   . Early stage glaucoma   . Heart murmur     "since my teens" (06/18/2012)  . Cervical stenosis of spine   . GERD (gastroesophageal reflux disease)   . Bone cancer     multiple bone lesion/right humerus  . Spinal stenosis   . Chronic kidney disease     renal insufficiency  . Arthritis   . DDD (degenerative disc disease)     c-5=-c6, and spondylosis  . Cancer 05/2012    IgA lambda multiple myeloma  . Anorexia 06/25/12    1 month hx   . Diverticulitis   . History of chemotherapy 07/01/12    Velcade,Cytoxin and decadron   Past Surgical History  Procedure Date  . Lipoma excision 07/1986    left shoulder/scapula  . Esophagogastroduodenoscopy 06/20/2012    Procedure: ESOPHAGOGASTRODUODENOSCOPY (EGD);  Surgeon: Theda Belfast, MD;  Location: [facility] ENDOSCOPY;  Service: Endoscopy;  Laterality: N/A;  . Prostate biopsy 01/28/07    benign   Family History  Problem Relation Age of Onset  . Benign prostatic hyperplasia Father   . Pneumonia Father   . Coronary artery disease Father     PTCA    . Heart disease Brother     CABG, CAD,  . Benign prostatic hyperplasia Brother    History   Social History  . Marital Status: Married    Spouse Name: N/A    Number of Children: N/A  . Years of Education: 84   Occupational History  . businessman     semi-retired   Social History Main Topics  . Smoking status: Never Smoker   . Smokeless tobacco: Never Used  . Alcohol Use: No  . Drug Use: No  . Sexually Active: Not Currently -- Male partner(s)   Other Topics Concern  . Not on file   Social History Narrative   [employer] BS, MBA [employer].  Married 27-Jan-1962. 1 daughter '64-deceased 05/08/2023 sepsis.  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 Marriage in good health. Playing golf. Doing well overwell.    Current Outpatient Prescriptions on File Prior to Visit  Medication Sig Dispense Refill  . acetaminophen (TYLENOL) 500 MG tablet Take 1,000 mg by mouth every 6 (six) hours as needed. For pain      . acyclovir (ZOVIRAX) 400 MG tablet Take 1 tablet (  400 mg total) by mouth 2 (two) times daily.  60 tablet  6  . allopurinol (ZYLOPRIM) 100 MG tablet Take 1 tablet (100 mg total) by mouth daily.  30 tablet  11  . amLODipine (NORVASC) 5 MG tablet Take 5 mg by mouth at bedtime.      Marland Kitchen atenolol-chlorthalidone (TENORETIC) 50-25 MG per tablet TAKE ONE TABLET ONCE DAILY  30 tablet  5  . b complex vitamins tablet Take 1 tablet by mouth daily.        . bimatoprost (LUMIGAN) 0.03 % ophthalmic drops Place 1 drop into both eyes at bedtime.        . brimonidine-timolol (COMBIGAN) 0.2-0.5 % ophthalmic solution Place 1 drop into both eyes 2 (two) times daily.        . clindamycin (CLEOCIN T) 1 % lotion       . esomeprazole (NEXIUM) 40 MG capsule Take 1 capsule (40 mg total) by mouth daily before breakfast.   30 capsule  11  . ondansetron (ZOFRAN) 8 MG tablet Take 1 tablet (8 mg total) by mouth every 8 (eight) hours as needed.  20 tablet  3  . prochlorperazine (COMPAZINE) 10 MG tablet Take 1 tablet (10 mg total) by mouth every 6 (six) hours as needed.  30 tablet  3  . simvastatin (ZOCOR) 20 MG tablet Take 20 mg by mouth at bedtime.      . sodium bicarbonate 650 MG tablet Take 2 tablets (1,300 mg total) by mouth 2 (two) times daily.  120 tablet  5  . Thiamine HCl (VITAMIN B-1) 100 MG tablet Take 100 mg by mouth daily.         No current facility-administered medications on file prior to visit.      Review of Systems System review is negative for any constitutional, cardiac, pulmonary, GI or neuro symptoms or complaints other than as described in the HPI.     Objective:   Physical Exam Filed Vitals:   07/07/12 1115  BP: 138/64  Pulse: 61  Temp: 97 F (36.1 C)  Resp: 16   BP Readings from Last 3 Encounters:  07/08/12 150/70  07/07/12 138/64  07/04/12 158/63   Gen'l- Slender AA man in no distress HEENT C&S clear Cor - RRR Pulm - normal respirations Neuro - A&O x 3       Assessment & Plan:

## 2012-07-08 NOTE — Telephone Encounter (Signed)
gv wife appt schedule for November and December. Wife aware I will call re appt w/DM for 12/3 (poss 11am). Waiting return call from desk nurse re f/u appt.

## 2012-07-08 NOTE — Assessment & Plan Note (Signed)
Myeloma undergoing chemo. For a metastatic right humerus lucency he will undergo XRT

## 2012-07-08 NOTE — Progress Notes (Signed)
This office note has been dictated.  #[id number]

## 2012-07-08 NOTE — Telephone Encounter (Signed)
Added lb for 11/22 per 2nd 11/19 pof. Pt given new schedule while in infusion.

## 2012-07-09 ENCOUNTER — Other Ambulatory Visit: Payer: Medicare Other | Admitting: Lab

## 2012-07-09 ENCOUNTER — Other Ambulatory Visit: Payer: Self-pay

## 2012-07-09 ENCOUNTER — Telehealth: Payer: Self-pay

## 2012-07-09 ENCOUNTER — Ambulatory Visit: Payer: Medicare Other | Admitting: Oncology

## 2012-07-09 DIAGNOSIS — C419 Malignant neoplasm of bone and articular cartilage, unspecified: Secondary | ICD-10-CM

## 2012-07-09 DIAGNOSIS — C9000 Multiple myeloma not having achieved remission: Secondary | ICD-10-CM

## 2012-07-09 LAB — IGA: IgA: 4590 mg/dL — ABNORMAL HIGH (ref 68–379)

## 2012-07-09 MED ORDER — POTASSIUM CHLORIDE CRYS ER 20 MEQ PO TBCR: 20.0000 meq | EXTENDED_RELEASE_TABLET | ORAL | Status: DC

## 2012-07-09 NOTE — Telephone Encounter (Signed)
S/w pt that his potassium was low and Dr. Arline Asp wants pt to take potassium. Rx sent to [facility].

## 2012-07-09 NOTE — Progress Notes (Signed)
CC:   [name], MD [name], M.D. [name], Ph.D., M.D. [name], M.D. [name], MD [name], MD  PROBLEM LIST: 1. IgA lambda multiple myeloma diagnosed in late October 2013.  The     patient has anemia, thrombocytopenia, multiple bone lesions, renal     insufficiency and monoclonal proteins in the serum and urine.  Bone     marrow carried out on 06/24/2012 yielded 48% plasma cells by     morphology.  Cytogenetic analysis revealed the presence of normal     male chromosomes with no observable clonal chromosomal     abnormalities.  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     This was considered a single cell abnormality and had not been     specifically associated with any leukemia subtype.  DNA probe panel     disclosed a gain of chromosome 11, which is consistent with     abnormalities associated with multiple myeloma.  A 24-hour urine     collection on 06/27/2012 showed 17.1 g of protein of which 16.9 g     was free lambda light chain. Chemotherapy with Velcade, Cytoxan and             Decadron was started on 07/01/2012. 2. Renal insufficiency.  Creatinine clearance on 06/27/2012 was 68     mL/min. 3. A 2.3 cm lytic lesion in the right humerus at risk for pathologic     fracture, to undergo radiation treatments by Dr. Roselind Messier from     07/10/2012 through 07/25/2012, 10 treatments.  4. Hypertension.  5. Benign prostate hypertrophy and elevated prostate specific antigen,  under the care of Dr. [name]. The patient had a prostate  biopsy in 2008 that was benign.  6. Dyslipidemia.  7. Arthritis involving the cervical spine  8. Glaucoma.  MEDICATIONS:  1. Allopurinol 100 mg daily.  2. Norvasc 5 mg daily.  3. Tenoretic 50-25 one tablet daily.  4. B complex vitamins 1 tablet daily.  5. Lumigan 0.03% ophthalmic drops 1 drop into both eyes at bedtime.    6. Combigan 0.2-0.5% ophthalmic solution 1 drop into both eyes twice  daily.  7. Nexium 40 mg daily.  8. Zocor 20 mg at bedtime.  9. Sodium bicarbonate 1300 mg twice daily.  10.Thiamine 100 mg daily.  11.Acyclovir 400 mg twice daily.  12.Compazine 10 mg every 6 hours as needed.  13.Zofran 8 mg every 8 hours as needed.  14.Rapaflo 8 mg at bedtime. 15.Chemotherapy with Velcade, Cytoxan and Decadron started 07/01/2012.  Pneumovax administered on 07/07/2012. Flu shot given in November 2013.   SMOKING HISTORY: The patient has never smoked cigarettes.    HISTORY:  Kristin Rossiter. Ebanks was seen today for followup of his IgA lambda multiple myeloma with heavy proteinuria.  Mr. Corvi is accompanied by his wife, [name].  He was last seen by [place] on 06/25/2012.  Mr. Alcindor was started on chemotherapy consisting of Velcade subcutaneously, IV Cytoxan and Decadron on November 12th.  He received a 2nd dose of treatment on November 15th, and is here today for additional chemotherapy consisting of the 3 drugs.  He seemed to have tolerated treatment well.  He has had some slight nausea but no vomiting.  He denies any pain.  He denies any neuropathy.  He has been having some difficulty with urination, which  was helped when he took a dose of Rapaflo.  He plans to continue this.  The patient's appetite is fair. He is really without any major complaints today.  Because of concerns about the lytic lesion in the right humerus, the patient will be receiving radiation treatments under the direction of Dr. Antony Blackbird.  These treatments will start on the 21st and will probably end after 10 treatments on December 6th.  The patient saw Dr. Debby Bud yesterday and received his pneumonia shot. He had received a flu shot a few weeks before that.  As stated, he is without complaints today.  PHYSICAL EXAM:  General:  Mr. Oettinger is somewhat thin, has lost a couple of pounds.  Vital signs:  His weight is  149.7 pounds.  Height is 5 feet 9 inches.  Body surface area 1.82 m2.  Blood pressure 150/70. Other vital signs are normal.  He is afebrile.  HEENT:  There is no scleral icterus.  Mouth and pharynx are benign.  No peripheral adenopathy palpable.  Heart/Lungs:  Normal.  Back:  No skeletal tenderness.  Abdomen:  Benign.  I did not appreciate a liver edge although I had felt a soft liver edge 4 cm below the right costal margin on his last visit here on 06/25/2012.  Extremities:  No peripheral edema or clubbing.  No areas of bony tenderness particularly over the right humerus.  The patient does not have a Port-A-Cath or central catheter. Neurologic:  Normal.  LABORATORY DATA:  Today, white count 4, ANC 2.3, hemoglobin 10.2, hematocrit 30.4, platelets 71,000.  On 07/01/2012 white count was 5.4, ANC 3.2, hemoglobin 11.1, hematocrit 33.2, and platelets were 114,000. Chemistries:  IgA level, uric acid, LDH are pending.  Chemistries from 07/01/2012 notable for a BUN of 22, creatinine 1.6.  Renal function has improved.  On 06/23/2012, BUN was 18, creatinine 2.4.  On 07/01/2012 total protein was 10.5, albumin 3.2.  Globulins were 7.3, calcium 10. On 11/06 the IgA level was 6080.  A 24-hour urine collection from 06/27/2012 disclosed 17.1 g of protein with 16.9 g of free lambda light chain.  Creatinine clearance was 68 mL/min.   IMAGING STUDIES:  1. 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 There is foraminal  encroachment at this level, left greater than right. There were  degenerative changes at other levels which were described in detail  in the body of the report.  2. Chest x-ray, 2 view, from 11/16/2009 showed no acute findings.  3. CT abdomen and pelvis without IV contrast on 06/18/2012 showed  multiple lytic bone lesions highly suspicious for metastatic   disease or multiple myeloma. The largest lesion seen with some  cortical destruction in the anterior aspect of the iliac bone  measured 3 cm. There was no evidence of colitis or diverticulitis.  There was no evidence for hydronephrosis or hydroureter. There  were bilateral probable renal cysts. There was an enlarged  prostate gland with indentation of the urinary bladder base. There  was a small right hydrocele.  4. Metastatic bone survey from 06/19/2012 showed lytic lesions in the  calvaria and right hemipelvis and a possible lesion in the right  humerus. This latter lesion was not specifically mentioned in the  report.  5. Nuclear bone scan on 06/19/2012 showed some increased uptake  involving the femur and humerus bilaterally, possibly reflecting  changes of  metabolic bone disease. There was a lack of correlation  with the bone scan and the findings seen on CT and x-rays, raising  the possibility of multiple myeloma.  6. Renal ultrasound on 06/19/2012 showed increased echogenicity of the  renal parenchyma bilaterally consistent with renal medical disease.  There was no evidence for obstruction. There was a slightly  enlarged prostate gland.  7. CT scan of the right humerus without IV contrast on 06/20/2012  showed 2.3-cm lucent lesion with probable mild endosteal thinning  anteriorly near the bicipital groove. There were several small  lytic lesions present within the humeral head, scapula, glenoid,  and proximal radius. No pathologic fracture or soft tissue mass  was seen. There were a few small nodules within the visualized  right lung, the largest of which was 4 mm in the upper lobe on  image 78.   PROCEDURES:  1. CT-guided bone marrow aspirate and biopsy was carried out on  06/24/2012.   IMPRESSION AND PLAN:  At the present time Mr. Mayse seems to be doing well.  His renal function has improved.  He is here today for day 8 of his chemotherapy cycle.  He will receive  Cytoxan 400 mg IV, Decadron 20 mg p.o. or IV and Velcade 2.5 mg subcutaneously.  He will return on Friday, November 22nd, for a CBC and if blood counts are satisfactory, Velcade 2.5 mg subcutaneously and Decadron 20 mg either p.o. or IV.  We will plan to check CBC on November 26th.  Mr. Fennell will not be receiving any chemotherapy during the week of November 25th which is Thanksgiving week.  We will plan to see Mr. Pen again on December 3rd at which time he will be due for the start of cycle #2 of chemotherapy.  We will check CBC, chemistries, LDH, IgA level and urine for light chains.  As stated Mr. Kia will be receiving radiation to the right humerus under the direction of Dr. Roselind Messier.  Issues for future consideration will include treatment with Zometa.  The patient will need dental clearance. We would like to see his renal function improve.  We will want him to have followup with Dr. Worthy Rancher regarding his right humerus.  We will also give some consideration to possible high-dose therapy and autologous bone marrow transplantation if the patient has a good response to treatment.    ______________________________ Samul Dada, M.D. DSM/MEDQ  D:  07/08/2012  T:  07/09/2012  Job:  [id number]

## 2012-07-09 NOTE — Progress Notes (Signed)
  Radiation Oncology         [phone] ________________________________  Name: Trevor Michael MRN: [id number]  Date: 07/07/2012  DOB: 03-26-1941  SIMULATION AND TREATMENT PLANNING NOTE  DIAGNOSIS:  Multiple myeloma  NARRATIVE:  The patient was brought to the CT Simulation planning suite.  Identity was confirmed.  All relevant records and images related to the planned course of therapy were reviewed.  The patient freely provided informed written consent to proceed with treatment after reviewing the details related to the planned course of therapy. The consent form was witnessed and verified by the simulation staff.  Then, the patient was set-up in a stable reproducible  supine position for radiation therapy.  CT images were obtained.  Surface markings were placed.  The CT images were loaded into the planning software.  Then the target and avoidance structures were contoured.  Treatment planning then occurred.  The radiation prescription was entered and confirmed.  A total of 3 complex treatment devices were fabricated. I have requested : Isodose Plan.  I have ordered:dose calc.  PLAN:  The patient will receive 25.0 Gy in 10 fractions.  ________________________________    [name], PhD, MD

## 2012-07-09 NOTE — Assessment & Plan Note (Signed)
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  Plan -  Trial of Rapaflo daily.  Follow up with his urologist

## 2012-07-10 ENCOUNTER — Ambulatory Visit
Admission: RE | Admit: 2012-07-10 | Discharge: 2012-07-10 | Disposition: A | Discharge status: Home / Self Care | Payer: Medicare Other | Source: Ambulatory Visit | Attending: Radiation Oncology | Admitting: Radiation Oncology

## 2012-07-10 ENCOUNTER — Encounter: Payer: Self-pay | Admitting: Radiation Oncology

## 2012-07-10 NOTE — Progress Notes (Signed)
  Radiation Oncology         [phone] ________________________________  Name: TAGG EUSTICE MRN: [id number]  Date: 07/10/2012  DOB: August 15, 1941  Simulation Verification Note  Status: outpatient  NARRATIVE: The patient was brought to the treatment unit and placed in the planned treatment position. The clinical setup was verified. Then port films were obtained and uploaded to the radiation oncology medical record software.  The treatment beams were carefully compared against the planned radiation fields. The position location and shape of the radiation fields was reviewed. They targeted volume of tissue appears to be appropriately covered by the radiation beams. Organs at risk appear to be excluded as planned.  Based on my personal review, I approved the simulation verification. The patient's treatment will proceed as planned.  -----------------------------------  [name], PhD, MD

## 2012-07-11 ENCOUNTER — Other Ambulatory Visit (HOSPITAL_BASED_OUTPATIENT_CLINIC_OR_DEPARTMENT_OTHER): Payer: Medicare Other | Admitting: Lab

## 2012-07-11 ENCOUNTER — Ambulatory Visit
Admission: RE | Admit: 2012-07-11 | Discharge: 2012-07-11 | Disposition: A | Discharge status: Home / Self Care | Payer: Medicare Other | Source: Ambulatory Visit | Attending: Radiation Oncology | Admitting: Radiation Oncology

## 2012-07-11 ENCOUNTER — Other Ambulatory Visit: Payer: Self-pay | Admitting: Medical Oncology

## 2012-07-11 ENCOUNTER — Encounter: Payer: Self-pay | Admitting: Medical Oncology

## 2012-07-11 ENCOUNTER — Telehealth: Payer: Self-pay

## 2012-07-11 ENCOUNTER — Telehealth: Payer: Self-pay | Admitting: Oncology

## 2012-07-11 ENCOUNTER — Telehealth: Payer: Self-pay | Admitting: Medical Oncology

## 2012-07-11 ENCOUNTER — Ambulatory Visit: Payer: Medicare Other

## 2012-07-11 DIAGNOSIS — C9000 Multiple myeloma not having achieved remission: Secondary | ICD-10-CM

## 2012-07-11 LAB — CBC WITH DIFFERENTIAL/PLATELET
BASO%: 0 % (ref 0.0–2.0)
Basophils Absolute: 0 10*3/uL (ref 0.0–0.1)
EOS%: 0.7 % (ref 0.0–7.0)
Eosinophils Absolute: 0 10*3/uL (ref 0.0–0.5)
HCT: 32.1 % — ABNORMAL LOW (ref 38.4–49.9)
HGB: 11.1 g/dL — ABNORMAL LOW (ref 13.0–17.1)
LYMPH%: 32 % (ref 14.0–49.0)
MONO#: 0.7 10*3/uL (ref 0.1–0.9)
NEUT#: 0.5 10*3/uL — CL (ref 1.5–6.5)
NEUT%: 29.4 % — ABNORMAL LOW (ref 39.0–75.0)
Platelets: 47 10*3/uL — ABNORMAL LOW (ref 140–400)
WBC: 1.7 10*3/uL — ABNORMAL LOW (ref 4.0–10.3)

## 2012-07-11 MED ORDER — LEVOFLOXACIN 500 MG PO TABS: 500.0000 mg | ORAL_TABLET | Freq: Every day | ORAL | Status: DC

## 2012-07-11 NOTE — Telephone Encounter (Signed)
F/u for 12/3 added. Start time remains the same. lmonvm for pt and pt to get new schedule 11/26.

## 2012-07-11 NOTE — Telephone Encounter (Signed)
Spoke with pt and he is aware that we called in levaquin for 5 days. I also let him know that his Iga has dropped to [id number] and Dr. Arline Asp is pleased. I also let him know that Dr. Arline Asp would like check his labs Mon 11/25 instead of 11/26. We will call him Monday am with an appt time. He voiced understanding.

## 2012-07-11 NOTE — Telephone Encounter (Signed)
I called pt and left a message that Dr. Arline Asp is going to call in levaquin to take for 5 days. I explained this is due to his WBC being low.( Pt was her today for chemo and it was held due to low WBC and platelets).  I asked him to call if he has any signs of fever or infection.

## 2012-07-11 NOTE — Telephone Encounter (Signed)
Received message that patient neutropenic wbc 1.7 neutrophil 0.5.spoke with Dr.Wentworth in agreement to hold radiation on 07/14/12.Patient scheduled for labs at 8:15 am on 07/15/12 at that time we will review results and inform whether to hold or go forth with treatment.Patient and wife informed Trevor Michael Rt linac #3.

## 2012-07-11 NOTE — Patient Instructions (Addendum)
Neutropenia Neutropenia is a condition that occurs when the level of a certain type of white blood cell (neutrophil) in your body becomes lower than normal. Neutrophils are made in the bone marrow and fight infections. These cells protect against bacteria and viruses. The fewer neutrophils you have, and the longer your body remains without them, the greater your risk of getting a severe infection becomes. CAUSES  The cause of neutropenia may be hard to determine. However, it is usually due to 3 main problems:   Decreased production of neutrophils. This may be due to:  Certain medicines such as chemotherapy.  Genetic problems.  Cancer.  Radiation treatments.  Vitamin deficiency.  Some pesticides.  Increased destruction of neutrophils. This may be due to:  Overwhelming infections.  Hemolytic anemia. This is when the body destroys its own blood cells.  Chemotherapy.  Neutrophils moving to areas of the body where they cannot fight infections. This may be due to:  Dialysis procedures.  Conditions where the spleen becomes enlarged. Neutrophils are held in the spleen and are not available to the rest of the body.  Overwhelming infections. The neutrophils are held in the area of the infection and are not available to the rest of the body. SYMPTOMS  There are no specific symptoms of neutropenia. The lack of neutrophils can result in an infection, and an infection can cause various problems. DIAGNOSIS  Diagnosis is made by a blood test. A complete blood count is performed. The normal level of neutrophils in human blood differs with age and race. Infants have lower counts than older children and adults. African Americans have lower counts than Caucasians or Asians. The average adult level is 1500 cells/mm3 of blood. Neutrophil counts are interpreted as follows:  Greater than 1000 cells/mm3 gives normal protection against infection.  500 to 1000 cells/mm3 gives an increased risk for  infection.  200 to 500 cells/mm3 is a greater risk for severe infection.  Lower than 200 cells/mm3 is a marked risk of infection. This may require hospitalization and treatment with antibiotic medicines. TREATMENT  Treatment depends on the underlying cause, severity, and presence of infections or symptoms. It also depends on your health. Your caregiver will discuss the treatment plan with you. Mild cases are often easily treated and have a good outcome. Preventative measures may also be started to limit your risk of infections. Treatment can include:  Taking antibiotics.  Stopping medicines that are known to cause neutropenia.  Correcting nutritional deficiencies by eating green vegetables to supply folic acid and taking vitamin B supplements.  Stopping exposure to pesticides if your neutropenia is related to pesticide exposure.  Taking a blood growth factor called sargramostim, pegfilgrastim, or filgrastim if you are undergoing chemotherapy for cancer. This stimulates white blood cell production.  Removal of the spleen if you have Felty's syndrome and have repeated infections. HOME CARE INSTRUCTIONS   Follow your caregiver's instructions about when you need to have blood work done.  Wash your hands often. Make sure others who come in contact with you also wash their hands.  Wash raw fruits and vegetables before eating them. They can carry bacteria and fungi.  Avoid people with colds or spreadable (contagious) diseases (chickenpox, herpes zoster, influenza).  Avoid large crowds.  Avoid construction areas. The dust can release fungus into the air.  Be cautious around children in daycare or school environments.  Take care of your respiratory system by coughing and deep breathing.  Bathe daily.  Protect your skin from cuts and   areas. The dust can release fungus into the air.   Be cautious around children in daycare or school environments.   Take care of your respiratory system by coughing and deep breathing.   Bathe daily.   Protect your skin from cuts and burns.   Do not work in the garden or with flowers and plants.   Care for the mouth before and after meals by brushing with a soft toothbrush. If you have  mucositis, do not use mouthwash. Mouthwash contains alcohol and can dry out the mouth even more.   Clean the area between the genitals and the anus (perineal area) after urination and bowel movements. Women need to wipe from front to back.   Use a water soluble lubricant during sexual intercourse and practice good hygiene after. Do not have intercourse if you are severely neutropenic. Check with your caregiver for guidelines.   Exercise daily as tolerated.   Avoid people who were vaccinated with a live vaccine in the past 30 days. You should not receive live vaccines (polio, typhoid).   Do not provide direct care for pets. Avoid animal droppings. Do not clean litter boxes and bird cages.   Do not share food utensils.   Do not use tampons, enemas, or rectal suppositories unless directed by your caregiver.   Use an electric razor to remove hair.   Wash your hands after handling magazines, letters, and newspapers.  SEEK IMMEDIATE MEDICAL CARE IF:    You have a fever.   You have chills or start to shake.   You feel nauseous or vomit.   You develop mouth sores.   You develop aches and pains.   You have redness and swelling around open wounds.   Your skin is warm to the touch.   You have pus coming from your wounds.   You develop swollen lymph nodes.   You feel weak or fatigued.   You develop red streaks on the skin.  MAKE SURE YOU:   Understand these instructions.   Will watch your condition.   Will get help right away if you are not doing well or get worse.  Document Released: 01/26/2002 Document Revised: 10/29/2011 Document Reviewed: 02/23/2011  [employer] Patient Information 2013 [employer].

## 2012-07-11 NOTE — Progress Notes (Signed)
1230-Hold Velcade today d/t ANC-0.5, WBC-1.7 and plts-47 per J. Hunter NP.  Pt instructed on neutropenic precautions.  Teach back done.  XRT notified of low blood counts and will contact pt this afternoon regarding further treatments per [place] Pt and pt.'s wife have no questions at this time.

## 2012-07-12 ENCOUNTER — Ambulatory Visit: Payer: Medicare Other

## 2012-07-14 ENCOUNTER — Ambulatory Visit: Payer: Medicare Other

## 2012-07-14 ENCOUNTER — Other Ambulatory Visit: Payer: Self-pay | Admitting: Medical Oncology

## 2012-07-14 ENCOUNTER — Telehealth: Payer: Self-pay

## 2012-07-14 ENCOUNTER — Encounter: Payer: Self-pay | Admitting: Oncology

## 2012-07-14 ENCOUNTER — Other Ambulatory Visit (HOSPITAL_BASED_OUTPATIENT_CLINIC_OR_DEPARTMENT_OTHER): Payer: Medicare Other | Admitting: Lab

## 2012-07-14 DIAGNOSIS — C9000 Multiple myeloma not having achieved remission: Secondary | ICD-10-CM

## 2012-07-14 LAB — CBC WITH DIFFERENTIAL/PLATELET
BASO%: 4.8 % — ABNORMAL HIGH (ref 0.0–2.0)
Basophils Absolute: 0.2 10*3/uL — ABNORMAL HIGH (ref 0.0–0.1)
EOS%: 0.2 % (ref 0.0–7.0)
Eosinophils Absolute: 0 10*3/uL (ref 0.0–0.5)
HCT: 31.3 % — ABNORMAL LOW (ref 38.4–49.9)
HGB: 10.7 g/dL — ABNORMAL LOW (ref 13.0–17.1)
LYMPH%: 23.4 % (ref 14.0–49.0)
MCH: 32.6 pg (ref 27.2–33.4)
MCHC: 34.2 g/dL (ref 32.0–36.0)
MCV: 95.4 fL (ref 79.3–98.0)
MONO#: 0.7 10*3/uL (ref 0.1–0.9)
MONO%: 19.2 % — ABNORMAL HIGH (ref 0.0–14.0)
NEUT#: 2 10*3/uL (ref 1.5–6.5)
NEUT%: 52.4 % (ref 39.0–75.0)
Platelets: 50 10*3/uL — ABNORMAL LOW (ref 140–400)
RBC: 3.28 10*6/uL — ABNORMAL LOW (ref 4.20–5.82)
RDW: 15.5 % — ABNORMAL HIGH (ref 11.0–14.6)
WBC: 3.7 10*3/uL — ABNORMAL LOW (ref 4.0–10.3)
lymph#: 0.9 10*3/uL (ref 0.9–3.3)

## 2012-07-14 LAB — BASIC METABOLIC PANEL (CC13)
BUN: 16 mg/dL (ref 7.0–26.0)
CO2: 30 mEq/L — ABNORMAL HIGH (ref 22–29)
Calcium: 9.3 mg/dL (ref 8.4–10.4)
Chloride: 99 mEq/L (ref 98–107)
Creatinine: 1.1 mg/dL (ref 0.7–1.3)
Glucose: 88 mg/dl (ref 70–99)
Potassium: 3.4 mEq/L — ABNORMAL LOW (ref 3.5–5.1)
Sodium: 138 mEq/L (ref 136–145)

## 2012-07-14 NOTE — Progress Notes (Signed)
On 07/11/12, ANC was 0.5 and platelets were 47k.  Day 11 chemo was held and the patient was started on prophylactic levaquin 500 mg daily for 5 days.

## 2012-07-14 NOTE — Telephone Encounter (Signed)
Patient ok for radiation treatment as lab results have improved.Called patient and informed to arrive between 8:20 am and 8:30 am.

## 2012-07-15 ENCOUNTER — Ambulatory Visit
Admission: RE | Admit: 2012-07-15 | Discharge: 2012-07-15 | Disposition: A | Discharge status: Home / Self Care | Payer: Medicare Other | Source: Ambulatory Visit | Attending: Radiation Oncology | Admitting: Radiation Oncology

## 2012-07-15 ENCOUNTER — Encounter: Payer: Self-pay | Admitting: Medical Oncology

## 2012-07-15 ENCOUNTER — Encounter: Payer: Self-pay | Admitting: Radiation Oncology

## 2012-07-15 ENCOUNTER — Other Ambulatory Visit: Payer: Medicare Other | Admitting: Lab

## 2012-07-15 ENCOUNTER — Telehealth: Payer: Self-pay | Admitting: Medical Oncology

## 2012-07-15 VITALS — Resp 18 | Wt 145.3 lb

## 2012-07-15 DIAGNOSIS — C9000 Multiple myeloma not having achieved remission: Secondary | ICD-10-CM

## 2012-07-15 NOTE — Progress Notes (Signed)
[facility]    Radiation Oncology [address] [place]     Maryln Gottron, M.D. [place], [place] [address]               [name], M.D., Ph.D. Phone: [phone]      Molli Hazard A. Kathrynn Running, M.D. Fax: [phone]      Radene Gunning, M.D., Ph.D.         Lurline Hare, M.D.         Grayland Jack, M.D Weekly Treatment Management Note  Name: Trevor Michael     MRN: [id number]        CSN: [id number] Date: 07/15/2012      DOB: 10/29/1940  CC: [name], MD         [place]    Status: Outpatient  Diagnosis: The encounter diagnosis was Multiple myeloma.  Current Dose: 7.5 Gy  Current Fraction: 3/10  Planned Dose: 25.0 Gy  Narrative: Trevor Michael was seen today for weekly treatment management. The chart was checked and port films  were reviewed. He is tolerating his radiation therapy well at this time. He denies any significant fatigue itching or discomfort in the treatment area.  He denies any pain in his right arm.  Review of patient's allergies indicates no known allergies.  Current Outpatient Prescriptions  Medication Sig Dispense Refill  . acetaminophen (TYLENOL) 500 MG tablet Take 1,000 mg by mouth every 6 (six) hours as needed. For pain      . acyclovir (ZOVIRAX) 400 MG tablet Take 1 tablet (400 mg total) by mouth 2 (two) times daily.  60 tablet  6  . allopurinol (ZYLOPRIM) 100 MG tablet Take 1 tablet (100 mg total) by mouth daily.  30 tablet  11  . amLODipine (NORVASC) 5 MG tablet Take 5 mg by mouth at bedtime.      Marland Kitchen atenolol-chlorthalidone (TENORETIC) 50-25 MG per tablet TAKE ONE TABLET ONCE DAILY  30 tablet  5  . b complex vitamins tablet Take 1 tablet by mouth daily.        . bimatoprost (LUMIGAN) 0.03 % ophthalmic drops Place 1 drop into both eyes at bedtime.        . brimonidine-timolol (COMBIGAN) 0.2-0.5 % ophthalmic solution Place 1 drop into both eyes 2 (two) times daily.        . clindamycin (CLEOCIN T) 1 % lotion       . esomeprazole  (NEXIUM) 40 MG capsule Take 1 capsule (40 mg total) by mouth daily before breakfast.  30 capsule  11  . levofloxacin (LEVAQUIN) 500 MG tablet Take 1 tablet (500 mg total) by mouth daily.  5 tablet  0  . ondansetron (ZOFRAN) 8 MG tablet Take 1 tablet (8 mg total) by mouth every 8 (eight) hours as needed.  20 tablet  3  . potassium chloride SA (K-DUR,KLOR-CON) 20 MEQ tablet Take 1 tablet (20 mEq total) by mouth every other day.  30 tablet  3  . prochlorperazine (COMPAZINE) 10 MG tablet Take 1 tablet (10 mg total) by mouth every 6 (six) hours as needed.  30 tablet  3  . simvastatin (ZOCOR) 20 MG tablet Take 20 mg by mouth at bedtime.      . sodium bicarbonate 650 MG tablet Take 2 tablets (1,300 mg total) by mouth 2 (two) times daily.  120 tablet  5  . Thiamine HCl (VITAMIN B-1) 100 MG tablet Take 100 mg by mouth daily.  Labs:  Lab Results  Component Value Date   WBC 3.7* 07/14/2012   HGB 10.7* 07/14/2012   HCT 31.3* 07/14/2012   MCV 95.4 07/14/2012   PLT 50* 07/14/2012   Lab Results  Component Value Date   CREATININE 1.1 07/14/2012   BUN 16.0 07/14/2012   NA 138 07/14/2012   K 3.4* 07/14/2012   CL 99 07/14/2012   CO2 30* 07/14/2012   Lab Results  Component Value Date   ALT 15 07/08/2012   AST 34 07/08/2012   PHOS 4.0 06/21/2012   BILITOT 0.53 07/08/2012    Physical Examination:  weight is 145 lb 4.8 oz (65.908 kg). His respiration is 18.    Wt Readings from Last 3 Encounters:  07/15/12 145 lb 4.8 oz (65.908 kg)  07/08/12 149 lb 11.2 oz (67.903 kg)  07/07/12 150 lb 4 oz (68.153 kg)     Lungs - Normal respiratory effort, chest expands symmetrically. Lungs are clear to auscultation, no crackles or wheezes.  Heart has regular rhythm and rate  Abdomen is soft and non tender with normal bowel sounds  Assessment:  Patient tolerating treatments well  Plan: Continue treatment per original radiation prescription

## 2012-07-15 NOTE — Progress Notes (Signed)
Received patient in the clinic today for PUT with Dr. Roselind Messier. Patient alert and oriented to person, place, and time. No distress noted. Steady gait noted. Pleasant affect noted. Patient denies pain at this time. Blood pressure elevated this morning but, patient has yet to take bp meds. Patient reports that the first thing he will do when he gets home is take his medication. Patient's only complaint today is decreased appetite. Patient reports that he is to follow up with Zenovia Jarred again next week. Patient reports that since being chemotherapy he has lost 10 pounds. Patient reports however, that just within the last few days his energy level and appetite has slowly increased. Patient denies decreased ROM of right upper extremity or numbness. Reported all findings to Dr. Roselind Messier.

## 2012-07-15 NOTE — Telephone Encounter (Signed)
I called pt to let him know that his potassium is 3.4 which is still low. Pt states that he started his potassium 20 meq on the 20th and he has continued to take it every other day. Per Dr. Arline Asp he would like for pt to increase to 20 meq daily. Pt voiced understanding. I asked him how he is feeling and he states he is feeling better and he is eating better. He has lost about 10 pounds since he started his chemo treatments. I encouraged him to snack and do supplements.

## 2012-07-16 ENCOUNTER — Ambulatory Visit
Admission: RE | Admit: 2012-07-16 | Discharge: 2012-07-16 | Disposition: A | Discharge status: Home / Self Care | Payer: Medicare Other | Source: Ambulatory Visit | Attending: Radiation Oncology | Admitting: Radiation Oncology

## 2012-07-21 ENCOUNTER — Ambulatory Visit
Admission: RE | Admit: 2012-07-21 | Discharge: 2012-07-21 | Disposition: A | Discharge status: Home / Self Care | Payer: Medicare Other | Source: Ambulatory Visit | Attending: Radiation Oncology | Admitting: Radiation Oncology

## 2012-07-22 ENCOUNTER — Ambulatory Visit (HOSPITAL_BASED_OUTPATIENT_CLINIC_OR_DEPARTMENT_OTHER): Payer: Medicare Other

## 2012-07-22 ENCOUNTER — Ambulatory Visit
Admission: RE | Admit: 2012-07-22 | Discharge: 2012-07-22 | Disposition: A | Discharge status: Home / Self Care | Payer: Medicare Other | Source: Ambulatory Visit | Attending: Radiation Oncology | Admitting: Radiation Oncology

## 2012-07-22 ENCOUNTER — Ambulatory Visit (HOSPITAL_BASED_OUTPATIENT_CLINIC_OR_DEPARTMENT_OTHER): Payer: Medicare Other | Admitting: Oncology

## 2012-07-22 ENCOUNTER — Other Ambulatory Visit (HOSPITAL_BASED_OUTPATIENT_CLINIC_OR_DEPARTMENT_OTHER): Payer: Medicare Other | Admitting: Lab

## 2012-07-22 ENCOUNTER — Ambulatory Visit: Payer: Medicare Other | Admitting: Nutrition

## 2012-07-22 ENCOUNTER — Encounter: Payer: Self-pay | Admitting: Oncology

## 2012-07-22 VITALS — BP 149/67 | HR 70 | Temp 97.4°F | Wt 151.1 lb

## 2012-07-22 VITALS — BP 150/72 | HR 67 | Temp 97.9°F | Resp 20 | Ht 69.0 in | Wt 150.1 lb

## 2012-07-22 DIAGNOSIS — C9000 Multiple myeloma not having achieved remission: Secondary | ICD-10-CM

## 2012-07-22 DIAGNOSIS — Z5112 Encounter for antineoplastic immunotherapy: Secondary | ICD-10-CM

## 2012-07-22 LAB — CBC WITH DIFFERENTIAL/PLATELET
Basophils Absolute: 0 10*3/uL (ref 0.0–0.1)
Eosinophils Absolute: 0 10*3/uL (ref 0.0–0.5)
HCT: 29.8 % — ABNORMAL LOW (ref 38.4–49.9)
LYMPH%: 26.9 % (ref 14.0–49.0)
MCV: 92.3 fL (ref 79.3–98.0)
MONO#: 0.5 10*3/uL (ref 0.1–0.9)
MONO%: 17.4 % — ABNORMAL HIGH (ref 0.0–14.0)
NEUT#: 1.7 10*3/uL (ref 1.5–6.5)
NEUT%: 55.1 % (ref 39.0–75.0)
Platelets: 135 10*3/uL — ABNORMAL LOW (ref 140–400)
RBC: 3.23 10*6/uL — ABNORMAL LOW (ref 4.20–5.82)
WBC: 3.1 10*3/uL — ABNORMAL LOW (ref 4.0–10.3)

## 2012-07-22 LAB — URIC ACID (CC13): Uric Acid, Serum: 4.6 mg/dl (ref 2.6–7.4)

## 2012-07-22 LAB — COMPREHENSIVE METABOLIC PANEL (CC13)
AST: 26 U/L (ref 5–34)
Alkaline Phosphatase: 112 U/L (ref 40–150)
BUN: 13 mg/dL (ref 7.0–26.0)
Creatinine: 0.9 mg/dL (ref 0.7–1.3)
Glucose: 91 mg/dl (ref 70–99)

## 2012-07-22 MED ORDER — DEXAMETHASONE SODIUM PHOSPHATE 4 MG/ML IJ SOLN
20.0000 mg | Freq: Once | INTRAMUSCULAR | Status: AC
  Administered 2012-07-22: 20 mg via INTRAVENOUS

## 2012-07-22 MED ORDER — BORTEZOMIB CHEMO SQ INJECTION 3.5 MG (2.5MG/ML)
1.3000 mg/m2 | Freq: Once | INTRAMUSCULAR | Status: AC
  Administered 2012-07-22: 2.5 mg via SUBCUTANEOUS
  Filled 2012-07-22: qty 2.5

## 2012-07-22 MED ORDER — ONDANSETRON HCL 8 MG PO TABS
8.0000 mg | ORAL_TABLET | Freq: Once | ORAL | Status: AC
  Administered 2012-07-22: 8 mg via ORAL

## 2012-07-22 MED ORDER — SODIUM CHLORIDE 0.9 % IV SOLN
400.0000 mg | Freq: Once | INTRAVENOUS | Status: AC
  Administered 2012-07-22: 400 mg via INTRAVENOUS
  Filled 2012-07-22: qty 20

## 2012-07-22 NOTE — Progress Notes (Signed)
Here for weekly under treat visit for radiation to right humerus.Denies pain.Completed 6 of 10 treatments.No fatigue.Appetite good.

## 2012-07-22 NOTE — Progress Notes (Signed)
This office note has been dictated.  #[id number]

## 2012-07-22 NOTE — Progress Notes (Signed)
Patient reports he is eating better. He feels his appetite has improved. His weight has increased to 151.1 pounds September 3 from 145.3 pounds November 26. He is drinking boost plus twice a day. He has no new nutrition concerns.  Nutrition diagnosis: Food and nutrition related knowledge deficit has improved.  Intervention: I provided support and encouragement for patient to continue small frequent meals with higher calorie, higher protein snacks. I've educated patient to consume boost plus between meals rather than with meals. I've also instructed him to add an additional boost plus if his food intake drops directly after chemotherapy. Patient able to teach back strategies for increasing oral intake.  Monitoring, evaluation, goals: The patient is tolerating his oral diet and has had some weight gain.  Next visit: I will follow up with patient during chemotherapy or sooner if needed. Patient has my contact information.

## 2012-07-22 NOTE — Progress Notes (Signed)
Patient here for weekly assessment of radiation of left lung.Completed 1 of 35 treatments.Had round of chemotherapy on yesterday and patient is feeling better today than he has felt in a long time.Denies pain and cough improved.

## 2012-07-22 NOTE — Patient Instructions (Addendum)
[facility] Discharge Instructions for Patients Receiving Chemotherapy  Today you received the following chemotherapy agents Cytoxan and Velcade  To help prevent nausea and vomiting after your treatment, we encourage you to take your nausea medication. Begin taking your nausea medication as often as prescribed for by Dr. Murinson.    If you develop nausea and vomiting that is not controlled by your nausea medication, call the clinic. If it is after clinic hours your family physician or the after hours number for the clinic or go to the Emergency Department.   BELOW ARE SYMPTOMS THAT SHOULD BE REPORTED IMMEDIATELY:  *FEVER GREATER THAN 100.5 F  *CHILLS WITH OR WITHOUT FEVER  NAUSEA AND VOMITING THAT IS NOT CONTROLLED WITH YOUR NAUSEA MEDICATION  *UNUSUAL SHORTNESS OF BREATH  *UNUSUAL BRUISING OR BLEEDING  TENDERNESS IN MOUTH AND THROAT WITH OR WITHOUT PRESENCE OF ULCERS  *URINARY PROBLEMS  *BOWEL PROBLEMS  UNUSUAL RASH Items with * indicate a potential emergency and should be followed up as soon as possible.  One of the nurses will contact you 24 hours after your treatment. Please let the nurse know about any problems that you may have experienced. Feel free to call the clinic you have any questions or concerns. The clinic phone number is [phone].   I have been informed and understand all the instructions given to me. I know to contact the clinic, my physician, or go to the Emergency Department if any problems should occur. I do not have any questions at this time, but understand that I may call the clinic during office hours   should I have any questions or need assistance in obtaining follow up care.    __________________________________________  _____________  __________ Signature of Patient or Authorized Representative            Date                   Time    __________________________________________ Nurse's Signature    

## 2012-07-22 NOTE — Progress Notes (Signed)
[facility]    Radiation Oncology [address] [place]     Maryln Gottron, M.D. [place], [place] [address]               [name], M.D., Ph.D. Phone: [phone]      Molli Hazard A. Kathrynn Running, M.D. Fax: [phone]      Radene Gunning, M.D., Ph.D.         Lurline Hare, M.D.         Grayland Jack, M.D Weekly Treatment Management Note  Name: Trevor Michael     MRN: [id number]        CSN: [id number] Date: 07/22/2012      DOB: 1941/07/26  CC: [name], MD         [place]    Status: Outpatient  Diagnosis: The encounter diagnosis was Multiple myeloma.  Current Dose: 15.0 Gy  Current Fraction: 6/10  Planned Dose: 25.0 Gy  Narrative: Trevor Michael was seen today for weekly treatment management. The chart was checked and port films  were reviewed. He continues to tolerate his radiation therapy well without any significant itching or discomfort in the right arm or fatigue.  He denies any pain in his right arm.  Review of patient's allergies indicates no known allergies.  Current Outpatient Prescriptions  Medication Sig Dispense Refill  . acetaminophen (TYLENOL) 500 MG tablet Take 1,000 mg by mouth every 6 (six) hours as needed. For pain      . acyclovir (ZOVIRAX) 400 MG tablet Take 1 tablet (400 mg total) by mouth 2 (two) times daily.  60 tablet  6  . allopurinol (ZYLOPRIM) 100 MG tablet Take 1 tablet (100 mg total) by mouth daily.  30 tablet  11  . amLODipine (NORVASC) 5 MG tablet Take 5 mg by mouth at bedtime.      Marland Kitchen atenolol-chlorthalidone (TENORETIC) 50-25 MG per tablet TAKE ONE TABLET ONCE DAILY  30 tablet  5  . b complex vitamins tablet Take 1 tablet by mouth daily.        . bimatoprost (LUMIGAN) 0.03 % ophthalmic drops Place 1 drop into both eyes at bedtime.        . brimonidine-timolol (COMBIGAN) 0.2-0.5 % ophthalmic solution Place 1 drop into both eyes 2 (two) times daily.        . clindamycin (CLEOCIN T) 1 % lotion       . esomeprazole (NEXIUM) 40 MG  capsule Take 1 capsule (40 mg total) by mouth daily before breakfast.  30 capsule  11  . levofloxacin (LEVAQUIN) 500 MG tablet Take 1 tablet (500 mg total) by mouth daily.  5 tablet  0  . ondansetron (ZOFRAN) 8 MG tablet Take 1 tablet (8 mg total) by mouth every 8 (eight) hours as needed.  20 tablet  3  . potassium chloride SA (K-DUR,KLOR-CON) 20 MEQ tablet Take 20 mEq by mouth daily.      . prochlorperazine (COMPAZINE) 10 MG tablet Take 1 tablet (10 mg total) by mouth every 6 (six) hours as needed.  30 tablet  3  . simvastatin (ZOCOR) 20 MG tablet Take 20 mg by mouth at bedtime.      . sodium bicarbonate 650 MG tablet Take 2 tablets (1,300 mg total) by mouth 2 (two) times daily.  120 tablet  5  . Thiamine HCl (VITAMIN B-1) 100 MG tablet Take 100 mg by mouth daily.         Labs:  Lab Results  Component Value Date   WBC 3.7* 07/14/2012   HGB 10.7* 07/14/2012   HCT 31.3* 07/14/2012   MCV 95.4 07/14/2012   PLT 50* 07/14/2012   Lab Results  Component Value Date   CREATININE 1.1 07/14/2012   BUN 16.0 07/14/2012   NA 138 07/14/2012   K 3.4* 07/14/2012   CL 99 07/14/2012   CO2 30* 07/14/2012   Lab Results  Component Value Date   ALT 15 07/08/2012   AST 34 07/08/2012   PHOS 4.0 06/21/2012   BILITOT 0.53 07/08/2012    Physical Examination:  weight is 151 lb 1.6 oz (68.539 kg). His temperature is 97.4 F (36.3 C). His blood pressure is 149/67 and his pulse is 70.    Wt Readings from Last 3 Encounters:  07/22/12 151 lb 1.6 oz (68.539 kg)  07/15/12 145 lb 4.8 oz (65.908 kg)  07/08/12 149 lb 11.2 oz (67.903 kg)     Lungs - Normal respiratory effort, chest expands symmetrically. Lungs are clear to auscultation, no crackles or wheezes.  Heart has regular rhythm and rate  Abdomen is soft and non tender with normal bowel sounds  Assessment:  Patient tolerating treatments well  Plan: Continue treatment per original radiation prescription

## 2012-07-23 ENCOUNTER — Telehealth: Payer: Self-pay | Admitting: Oncology

## 2012-07-23 ENCOUNTER — Ambulatory Visit
Admission: RE | Admit: 2012-07-23 | Discharge: 2012-07-23 | Disposition: A | Discharge status: Home / Self Care | Payer: Medicare Other | Source: Ambulatory Visit | Attending: Radiation Oncology | Admitting: Radiation Oncology

## 2012-07-23 NOTE — Telephone Encounter (Signed)
appts  Made and printed for pt Trevor Michael

## 2012-07-23 NOTE — Progress Notes (Signed)
CC:   [name], MD [name], M.D. [name], Ph.D., M.D. [name], M.D. [name], MD [name], MD  PROBLEM LIST: 1. IgA lambda multiple myeloma diagnosed in late October 2013.  The patient has anemia, thrombocytopenia, multiple bone lesions, renal insufficiency, and monoclonal proteins in the serum and urine.  Bone marrow carried out on 06/24/2012 yielded 48% plasma cells by morphology. Cytogenetic analysis revealed the presence of normal male chromosomes with no observable clonal chromosomal abnormalities.  There was a single cell with additional chromosome material on 3q, loss of chromosome 13 and a marker chromosome.  DNA probe panel disclosed a gain of chromosome 11 which is consistent with abnormalities associated with multiple myeloma.  A 24-hour urine collection on 06/27/2012 yielded 17.1 g of protein of which 16.9 g was free lambda light chains.  Beta 2 microglobulin was 5.8.  Albumin 3.2.  Myeloma is ISS stage III.  Chemotherapy with Velcade, Cytoxan, and Decadron was started on 07/01/2012.  2. Renal insufficiency. Creatinine clearance on 06/27/2012 was 68  mL/min.  3. A 2.3 cm lytic lesion in the right humerus at risk for pathologic  fracture, currently undergoing radiation treatments by Dr. Roselind Messier from  07/10/2012 through 07/25/2012, 10 treatments.  4. Hypertension.  5. Benign prostate hypertrophy and elevated prostate specific antigen,  under the care of Dr. [name]. The patient had a prostate  biopsy in 2008 that was benign.  6. Dyslipidemia.  7. Arthritis involving the cervical spine  8. Glaucoma.    MEDICATIONS:  1. Allopurinol 100 mg daily.  2. Norvasc 5 mg daily.  3. Tenoretic 50-25 one tablet daily.  4. B complex vitamins 1 tablet daily.  5. Lumigan 0.03% ophthalmic drops 1 drop into both eyes at bedtime.  6. Combigan 0.2-0.5% ophthalmic solution 1 drop into both eyes twice  daily.  7. Nexium 40 mg  daily.  8. Zocor 20 mg at bedtime.  9. Sodium bicarbonate 1300 mg twice daily.  10.Thiamine 100 mg daily.  11.Acyclovir 400 mg twice daily.  12.Compazine 10 mg every 6 hours as needed.  13.Zofran 8 mg every 8 hours as needed.  14.Rapaflo 8 mg at bedtime.  15.Chemotherapy with Velcade, Cytoxan and Decadron started 07/01/2012. 16.K-Dur 20 mEq daily.   Pneumovax administered on 07/07/2012.  Flu shot given in November 2013.   SMOKING HISTORY: The patient has never smoked cigarettes.    HISTORY:  I saw Trevor Michael. Vonada today for followup of his IgA lambda multiple myeloma with heavy proteinuria, stage III.  Trevor Michael was accompanied by his wife, [name].  He was last seen by [place] on 07/08/2012.  As stated above, he started his chemotherapy treatments on 07/01/2012.  He received treatments on 11/15 and 06/2018, however on 11/22 his platelet count was 47,000 and his ANC was 0.5.  Chemotherapy was held on that day.  The patient was given prophylactic of Levaquin 500 mg daily for 5 days.  He did not have any infections or fever.  He has some loss of appetite.  His weight initially decreased but subsequently has increased a little bit.  His weight has been generally stable over the past several weeks since his diagnosis although I believe he is about 10 pounds below his pre-illness weight.  The patient denies any sign of a sensory peripheral neuropathy from the Velcade and, in general, has tolerated his treatments well.  He is here today for the start of his 2nd cycle of treatment  and is scheduled to receive Cytoxan 400 mg IV, Velcade 2.5 mg subcu and Decadron 20 mg IV. Trevor Michael continues to receive radiation treatments to his right humerus under the direction of Dr. Antony Blackbird.  PHYSICAL EXAM:  The patient shows no change, looks generally well, although a little thin.  Weight is 150.1 pounds, height 5 feet 9 inches, body surface area 1.82 sq m.  Blood pressure 150/72.  Other vital  signs are normal.  There is no scleral icterus.  He does have some conjunctival injection bilaterally.  Mouth and pharynx are benign.  No peripheral adenopathy palpable.  Heart and lungs are normal.  Back:  No skeletal tenderness.  Abdomen:  Benign.  Again, a sharp liver edge could be felt about 3 or 4 cm below the right costal margin with inspiration. That has been noted previously.  No splenomegaly, abdominal masses or ascites.  Extremities:  No peripheral edema or clubbing.  No tenderness over the right humerus.  I should mention the patient is receiving radiation to this area under the direction of Dr. Roselind Messier.  Today was treatment #6 out of 10.  He will be finished with his radiation treatments on Monday December 9th.  He is not having any pain or tenderness.  Neurologic:  Exam is normal.  The patient does not have a Port-A-Cath or central catheter.  LABORATORY DATA:  Today, white count 3.1, ANC 1.7, hemoglobin 10.3, hematocrit 29.8, platelets 135,000.  On 07/14/2012, platelet count was 50,000.  On 07/11/2012, the platelet count was 47,000, ANC was 0.5, white count 1.7 and hemoglobin 11.1.  Chemistries today are pending. Chemistries from 11/19 notable for a potassium of 3.3, total protein 9.2, albumin 3.1, and thus globulins were 6.1.  LDH was 320, and the IgA level was 4590, decreased from 6080 on 06/25/2012.  IMAGING STUDIES:  1. 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 There is foraminal  encroachment at this level, left greater than right. There were  degenerative changes at other levels which were described in detail  in the body of the report.  2. Chest x-ray, 2 view, from 11/16/2009 showed no acute findings.  3. CT abdomen and pelvis without IV contrast on 06/18/2012 showed  multiple lytic bone lesions highly suspicious for metastatic   disease or multiple myeloma. The largest lesion seen with some  cortical destruction in the anterior aspect of the iliac bone  measured 3 cm. There was no evidence of colitis or diverticulitis.  There was no evidence for hydronephrosis or hydroureter. There  were bilateral probable renal cysts. There was an enlarged  prostate gland with indentation of the urinary bladder base. There  was a small right hydrocele.  4. Metastatic bone survey from 06/19/2012 showed lytic lesions in the  calvaria and right hemipelvis and a possible lesion in the right  humerus. This latter lesion was not specifically mentioned in the  report.  5. Nuclear bone scan on 06/19/2012 showed some increased uptake  involving the femur and humerus bilaterally, possibly reflecting  changes of metabolic bone disease. There was a lack of correlation  with the bone scan and the findings seen on CT and x-rays, raising  the possibility of multiple myeloma.  6. Renal ultrasound on 06/19/2012 showed increased echogenicity of the  renal parenchyma bilaterally consistent with renal medical disease.  There was no evidence for obstruction. There  was a slightly  enlarged prostate gland.  7. CT scan of the right humerus without IV contrast on 06/20/2012  showed 2.3-cm lucent lesion with probable mild endosteal thinning  anteriorly near the bicipital groove. There were several small  lytic lesions present within the humeral head, scapula, glenoid,  and proximal radius. No pathologic fracture or soft tissue mass  was seen. There were a few small nodules within the visualized  right lung, the largest of which was 4 mm in the upper lobe on  image 78.    PROCEDURES:  1. CT-guided bone marrow aspirate and biopsy was carried out on  06/24/2012.   IMPRESSION AND PLAN:  Currently Trevor Michael is doing well.  As stated above, he is receiving prophylactic radiation treatments to the right humerus under the direction of Dr. Antony Blackbird.  Today's treatment is #6 out of a planned 10 with the final treatment scheduled for Monday, December 9th.  Trevor Michael is scheduled for the start of cycle #2 of chemotherapy today.  He will receive Cytoxan 400 mg IV, Velcade 2.5 mg subcu and Decadron 20 mg IV.  We will check CBC with every chemotherapy treatment which are now planned for 12/06, 12/10 and 12/13.  The patient is receiving Cytoxan just once a week, so his next treatment scheduled for Cytoxan will be on December 10th.  Trevor Michael is scheduled to see me again with the following labs:  CBC, chemistries, IgA level, serum for light chains and urine for protein to creatinine ratio on December 23rd.  He is scheduled for the beginning of his 3rd cycle of chemotherapy on that day.  The patient's renal function has dramatically improved.  On 11/25, his creatinine was 1.1 and BUN 16.  On 07/08/2012, BUN was 22, creatinine 1.1.  I have asked the patient to obtain dental clearance from his dentist regarding the use of Zometa.  Other issues for consideration will be a followup appointment with Dr. Worthy Rancher regarding the right humerus and about possible referral for high-dose chemotherapy and autologous bone marrow transplant.  At the present time the patient seems to be doing well with his treatment and we are seeing evidence of a good response from the drop in the IgA level, serum globulin level and dramatic improvement in the patient's renal function.    ______________________________ Samul Dada, M.D. DSM/MEDQ  D:  07/22/2012  T:  07/23/2012  Job:  [id number]

## 2012-07-24 ENCOUNTER — Ambulatory Visit
Admission: RE | Admit: 2012-07-24 | Discharge: 2012-07-24 | Disposition: A | Discharge status: Home / Self Care | Payer: Medicare Other | Source: Ambulatory Visit | Attending: Radiation Oncology | Admitting: Radiation Oncology

## 2012-07-24 ENCOUNTER — Encounter: Payer: Self-pay | Admitting: Oncology

## 2012-07-24 LAB — KAPPA/LAMBDA LIGHT CHAINS
Kappa free light chain: 0.03 mg/dL — ABNORMAL LOW (ref 0.33–1.94)
Lambda Free Lght Chn: 55.9 mg/dL — ABNORMAL HIGH (ref 0.57–2.63)

## 2012-07-24 LAB — IGA: IgA: 4280 mg/dL — ABNORMAL HIGH (ref 68–379)

## 2012-07-24 NOTE — Progress Notes (Signed)
Free lambda light chains on 07/22/2012 were 55.90

## 2012-07-25 ENCOUNTER — Ambulatory Visit (HOSPITAL_BASED_OUTPATIENT_CLINIC_OR_DEPARTMENT_OTHER): Payer: Medicare Other

## 2012-07-25 ENCOUNTER — Ambulatory Visit
Admission: RE | Admit: 2012-07-25 | Discharge: 2012-07-25 | Disposition: A | Discharge status: Home / Self Care | Payer: Medicare Other | Source: Ambulatory Visit | Attending: Radiation Oncology | Admitting: Radiation Oncology

## 2012-07-25 ENCOUNTER — Ambulatory Visit: Payer: Medicare Other

## 2012-07-25 VITALS — BP 172/85 | HR 67 | Temp 97.2°F

## 2012-07-25 DIAGNOSIS — C9000 Multiple myeloma not having achieved remission: Secondary | ICD-10-CM

## 2012-07-25 DIAGNOSIS — Z5112 Encounter for antineoplastic immunotherapy: Secondary | ICD-10-CM

## 2012-07-25 MED ORDER — BORTEZOMIB CHEMO SQ INJECTION 3.5 MG (2.5MG/ML)
1.3000 mg/m2 | Freq: Once | INTRAMUSCULAR | Status: AC
  Administered 2012-07-25: 2.5 mg via SUBCUTANEOUS
  Filled 2012-07-25: qty 2.5

## 2012-07-25 MED ORDER — ONDANSETRON HCL 8 MG PO TABS
8.0000 mg | ORAL_TABLET | Freq: Once | ORAL | Status: AC
  Administered 2012-07-25: 8 mg via ORAL

## 2012-07-25 MED ORDER — DEXAMETHASONE 4 MG PO TABS
20.0000 mg | ORAL_TABLET | Freq: Once | ORAL | Status: AC
  Administered 2012-07-25: 20 mg via ORAL

## 2012-07-25 NOTE — Patient Instructions (Signed)
[facility] Discharge Instructions for Patients Receiving Chemotherapy  Today you received the following chemotherapy agents: Velcade.  To help prevent nausea and vomiting after your treatment, we encourage you to take your nausea medication, Zofran. Begin taking it 07/26/12 and take it as often as prescribed for the next 48 hours.   If you develop nausea and vomiting that is not controlled by your nausea medication, call the clinic. If it is after clinic hours your family physician or the after hours number for the clinic or go to the Emergency Department.   BELOW ARE SYMPTOMS THAT SHOULD BE REPORTED IMMEDIATELY:  *FEVER GREATER THAN 100.5 F  *CHILLS WITH OR WITHOUT FEVER  NAUSEA AND VOMITING THAT IS NOT CONTROLLED WITH YOUR NAUSEA MEDICATION  *UNUSUAL SHORTNESS OF BREATH  *UNUSUAL BRUISING OR BLEEDING  TENDERNESS IN MOUTH AND THROAT WITH OR WITHOUT PRESENCE OF ULCERS  *URINARY PROBLEMS  *BOWEL PROBLEMS  UNUSUAL RASH Items with * indicate a potential emergency and should be followed up as soon as possible.  Feel free to call the clinic you have any questions or concerns. The clinic phone number is [phone].   I have been informed and understand all the instructions given to me. I know to contact the clinic, my physician, or go to the Emergency Department if any problems should occur. I do not have any questions at this time, but understand that I may call the clinic during office hours   should I have any questions or need assistance in obtaining follow up care.

## 2012-07-28 ENCOUNTER — Ambulatory Visit
Admission: RE | Admit: 2012-07-28 | Discharge: 2012-07-28 | Disposition: A | Discharge status: Home / Self Care | Payer: Medicare Other | Source: Ambulatory Visit | Attending: Radiation Oncology | Admitting: Radiation Oncology

## 2012-07-28 ENCOUNTER — Encounter: Payer: Self-pay | Admitting: Radiation Oncology

## 2012-07-28 ENCOUNTER — Ambulatory Visit: Payer: Medicare Other

## 2012-07-28 VITALS — BP 152/81 | HR 65 | Temp 97.7°F | Wt 153.5 lb

## 2012-07-28 DIAGNOSIS — C9000 Multiple myeloma not having achieved remission: Secondary | ICD-10-CM

## 2012-07-28 DIAGNOSIS — C419 Malignant neoplasm of bone and articular cartilage, unspecified: Secondary | ICD-10-CM

## 2012-07-28 NOTE — Progress Notes (Signed)
  Radiation Oncology         [phone]) [phone] ________________________________  Name: Trevor Michael MRN: [id number]  Date: 07/28/2012  DOB: 01/12/1941  End of Treatment Note  Diagnosis:   Multiple myeloma     Indication for treatment:  Significant involvement in the right proximal humerus       Radiation treatment dates:   07/10/2012 through in 07/28/2012  Site/dose:   Right humerus,  25 Gy in 10 fractions  Beams/energy:   AP PA, 6 MV photons  Narrative: The patient tolerated radiation treatment relatively well.   He did not experience any significant fatigue or itching in the treatment area.  Plan: The patient has completed radiation treatment. The patient will return to radiation oncology clinic for routine followup in one month. I advised them to call or return sooner if they have any questions or concerns related to their recovery or treatment.  -----------------------------------  [name], PhD, MD

## 2012-07-28 NOTE — Progress Notes (Signed)
Trevor Michael completes 10 fractions to his right humerus for his multiple myeloma.  He denies any pain and has good range of motion in the right arm.

## 2012-07-28 NOTE — Progress Notes (Signed)
   Weekly Management Note Completed Radiotherapy. Total Dose:  25Gy   Narrative:  The patient presents for routine under treatment assessment on last day of radiotherapy.  CBCT/MVCT images/Port film x-rays were reviewed.  The chart was checked. No complaints  Physical Findings:  weight is 153 lb 8 oz (69.627 kg). His temperature is 97.7 F (36.5 C). His blood pressure is 152/81 and his pulse is 65.   No distress.  Impression:  The patient has tolerated radiotherapy.  Plan:  Routine follow-up in one month. Please call if you have any questions or issues before one month followup - [phone]. ________________________________   Lonie Peak, M.D.

## 2012-07-28 NOTE — Patient Instructions (Signed)
Please call if you have any questions or issues before one month followup - [phone]. 

## 2012-07-29 ENCOUNTER — Encounter: Payer: Self-pay | Admitting: Oncology

## 2012-07-29 ENCOUNTER — Other Ambulatory Visit (HOSPITAL_BASED_OUTPATIENT_CLINIC_OR_DEPARTMENT_OTHER): Payer: Medicare Other

## 2012-07-29 ENCOUNTER — Ambulatory Visit: Payer: Medicare Other

## 2012-07-29 ENCOUNTER — Ambulatory Visit (HOSPITAL_BASED_OUTPATIENT_CLINIC_OR_DEPARTMENT_OTHER): Payer: Medicare Other

## 2012-07-29 VITALS — BP 178/84 | HR 62 | Temp 97.0°F

## 2012-07-29 DIAGNOSIS — C9000 Multiple myeloma not having achieved remission: Secondary | ICD-10-CM

## 2012-07-29 LAB — CBC WITH DIFFERENTIAL/PLATELET
Basophils Absolute: 0 10*3/uL (ref 0.0–0.1)
Eosinophils Absolute: 0 10*3/uL (ref 0.0–0.5)
HCT: 33.2 % — ABNORMAL LOW (ref 38.4–49.9)
HGB: 11.4 g/dL — ABNORMAL LOW (ref 13.0–17.1)
LYMPH%: 44.4 % (ref 14.0–49.0)
MCV: 94.3 fL (ref 79.3–98.0)
MONO#: 0.6 10*3/uL (ref 0.1–0.9)
MONO%: 34.8 % — ABNORMAL HIGH (ref 0.0–14.0)
NEUT#: 0.4 10*3/uL — CL (ref 1.5–6.5)
NEUT%: 20.8 % — ABNORMAL LOW (ref 39.0–75.0)
Platelets: 74 10*3/uL — ABNORMAL LOW (ref 140–400)
RBC: 3.52 10*6/uL — ABNORMAL LOW (ref 4.20–5.82)
WBC: 1.8 10*3/uL — ABNORMAL LOW (ref 4.0–10.3)
nRBC: 1 % — ABNORMAL HIGH (ref 0–0)

## 2012-07-29 MED ORDER — FILGRASTIM 480 MCG/0.8ML IJ SOLN
480.0000 ug | Freq: Once | INTRAMUSCULAR | Status: AC
  Administered 2012-07-29: 480 ug via SUBCUTANEOUS
  Filled 2012-07-29: qty 0.8

## 2012-07-29 NOTE — Patient Instructions (Signed)
[facility] Discharge Instructions for Patients Receiving Chemotherapy  Today you received the following Neupogen    If you develop nausea and vomiting that is not controlled by your nausea medication, call the clinic. If it is after clinic hours your family physician or the after hours number for the clinic or go to the Emergency Department.   BELOW ARE SYMPTOMS THAT SHOULD BE REPORTED IMMEDIATELY:  *FEVER GREATER THAN 100.5 F  *CHILLS WITH OR WITHOUT FEVER  NAUSEA AND VOMITING THAT IS NOT CONTROLLED WITH YOUR NAUSEA MEDICATION  *UNUSUAL SHORTNESS OF BREATH  *UNUSUAL BRUISING OR BLEEDING  TENDERNESS IN MOUTH AND THROAT WITH OR WITHOUT PRESENCE OF ULCERS  *URINARY PROBLEMS  *BOWEL PROBLEMS  UNUSUAL RASH Items with * indicate a potential emergency and should be followed up as soon as possible.  Feel free to call the clinic you have any questions or concerns. The clinic phone number is [phone].   I have been informed and understand all the instructions given to me. I know to contact the clinic, my physician, or go to the Emergency Department if any problems should occur. I do not have any questions at this time, but understand that I may call the clinic during office hours   should I have any questions or need assistance in obtaining follow up care.

## 2012-07-29 NOTE — Progress Notes (Signed)
Chemotherapy scheduled for today, 07/29/2012, day 8 of cycle 2 will be held because of an ANC of 0.4. White count was 1.8. Hemoglobin 11.4. Hematocrit 33.2. Platelets 74,000.  The patient received Neupogen 480 mcg subcutaneously today.  IgA level on 07/22/2012 was 4280.  Patient will come in on day 11, Friday, 12/13/2013a for a CBC and possibly chemotherapy if blood counts are acceptable.  We may need to consider treating the patient on a weekly schedule rather than the current schedule.    We have a note from Dr. Roselind Messier:  The patient received radiation treatments to the right humerus from 07/10/2012 through 07/28/2012. He received 25 Gray in 10 fractions.

## 2012-07-29 NOTE — Progress Notes (Signed)
Dr. Arline Asp aware of pt's counts.  Will hold chemotherapy today per Dr. Arline Asp.  Pt has appt for labs, chemo on Friday.  Advised to keep that appointment, call with any signs of fever or infection.  Copy of labs given to pt, discussed neutropenic precautions-dhp, rn

## 2012-07-30 ENCOUNTER — Ambulatory Visit: Payer: Medicare Other

## 2012-07-31 ENCOUNTER — Ambulatory Visit: Payer: Medicare Other

## 2012-08-01 ENCOUNTER — Encounter: Payer: Self-pay | Admitting: Oncology

## 2012-08-01 ENCOUNTER — Ambulatory Visit (HOSPITAL_BASED_OUTPATIENT_CLINIC_OR_DEPARTMENT_OTHER): Payer: Medicare Other

## 2012-08-01 ENCOUNTER — Other Ambulatory Visit (HOSPITAL_BASED_OUTPATIENT_CLINIC_OR_DEPARTMENT_OTHER): Payer: Medicare Other | Admitting: Lab

## 2012-08-01 ENCOUNTER — Ambulatory Visit: Payer: Medicare Other

## 2012-08-01 VITALS — BP 167/83 | HR 67 | Temp 98.0°F

## 2012-08-01 DIAGNOSIS — C9000 Multiple myeloma not having achieved remission: Secondary | ICD-10-CM

## 2012-08-01 DIAGNOSIS — Z5112 Encounter for antineoplastic immunotherapy: Secondary | ICD-10-CM

## 2012-08-01 LAB — CBC WITH DIFFERENTIAL/PLATELET
BASO%: 0.1 % (ref 0.0–2.0)
Basophils Absolute: 0 10*3/uL (ref 0.0–0.1)
EOS%: 0.2 % (ref 0.0–7.0)
HCT: 33 % — ABNORMAL LOW (ref 38.4–49.9)
HGB: 11.3 g/dL — ABNORMAL LOW (ref 13.0–17.1)
LYMPH%: 9.2 % — ABNORMAL LOW (ref 14.0–49.0)
MCH: 32.6 pg (ref 27.2–33.4)
MCHC: 34.2 g/dL (ref 32.0–36.0)
MCV: 95.1 fL (ref 79.3–98.0)
MONO#: 1.4 10*3/uL — ABNORMAL HIGH (ref 0.1–0.9)
MONO%: 11.1 % (ref 0.0–14.0)
NEUT%: 79.4 % — ABNORMAL HIGH (ref 39.0–75.0)
Platelets: 73 10*3/uL — ABNORMAL LOW (ref 140–400)
RBC: 3.47 10*6/uL — ABNORMAL LOW (ref 4.20–5.82)
lymph#: 1.1 10*3/uL (ref 0.9–3.3)

## 2012-08-01 MED ORDER — ONDANSETRON HCL 8 MG PO TABS
8.0000 mg | ORAL_TABLET | Freq: Once | ORAL | Status: AC
  Administered 2012-08-01: 8 mg via ORAL

## 2012-08-01 MED ORDER — DEXAMETHASONE 4 MG PO TABS
20.0000 mg | ORAL_TABLET | Freq: Once | ORAL | Status: AC
  Administered 2012-08-01: 20 mg via ORAL

## 2012-08-01 MED ORDER — BORTEZOMIB CHEMO SQ INJECTION 3.5 MG (2.5MG/ML)
1.3000 mg/m2 | Freq: Once | INTRAMUSCULAR | Status: AC
  Administered 2012-08-01: 2.5 mg via SUBCUTANEOUS
  Filled 2012-08-01: qty 2.5

## 2012-08-01 NOTE — Progress Notes (Signed)
The patient received Velcade 2.5 mg subcutaneous and Decadron 20 mg by mouth today. White count was 12.4 with an ANC of 9.8 hemoglobin 11.3 and platelet count of 73,000. Today was day 11 of the cycle.  It'll be recalled that on 07/29/2012 the white count was 1.8, ANC was 0.4 and platelet count was 74,000. Chemotherapy scheduled for that day, which was day 8 of the cycle was held. The patient would've received Cytoxan on that day. He did receive Neupogen 480 mcg subcutaneous on 07/29/2012.  The patient is scheduled to see me again on 08/11/2012. He'll be starting cycle #3 at that time. We may want to convert him to a weekly schedule utilizing all 3 drugs.

## 2012-08-01 NOTE — Progress Notes (Signed)
Cytoxan held today per dr Arline Asp

## 2012-08-05 ENCOUNTER — Other Ambulatory Visit: Payer: Self-pay | Admitting: Oncology

## 2012-08-08 ENCOUNTER — Telehealth: Payer: Self-pay | Admitting: *Deleted

## 2012-08-08 NOTE — Telephone Encounter (Signed)
Per books closing earlier on Christmas Eve I have moved his appt to earlier. Patient called and given appt. JMW

## 2012-08-11 ENCOUNTER — Other Ambulatory Visit: Payer: Self-pay

## 2012-08-11 ENCOUNTER — Telehealth: Payer: Self-pay | Admitting: *Deleted

## 2012-08-11 ENCOUNTER — Telehealth: Payer: Self-pay

## 2012-08-11 ENCOUNTER — Encounter: Payer: Self-pay | Admitting: Oncology

## 2012-08-11 ENCOUNTER — Other Ambulatory Visit (HOSPITAL_BASED_OUTPATIENT_CLINIC_OR_DEPARTMENT_OTHER): Payer: Medicare Other | Admitting: Lab

## 2012-08-11 ENCOUNTER — Ambulatory Visit (HOSPITAL_BASED_OUTPATIENT_CLINIC_OR_DEPARTMENT_OTHER): Payer: Medicare Other | Admitting: Oncology

## 2012-08-11 ENCOUNTER — Other Ambulatory Visit: Payer: Self-pay | Admitting: Oncology

## 2012-08-11 VITALS — BP 144/76 | HR 75 | Temp 97.2°F | Resp 20 | Ht 69.0 in | Wt 153.7 lb

## 2012-08-11 DIAGNOSIS — D696 Thrombocytopenia, unspecified: Secondary | ICD-10-CM

## 2012-08-11 DIAGNOSIS — C9000 Multiple myeloma not having achieved remission: Secondary | ICD-10-CM

## 2012-08-11 DIAGNOSIS — R809 Proteinuria, unspecified: Secondary | ICD-10-CM

## 2012-08-11 DIAGNOSIS — E876 Hypokalemia: Secondary | ICD-10-CM

## 2012-08-11 DIAGNOSIS — N289 Disorder of kidney and ureter, unspecified: Secondary | ICD-10-CM

## 2012-08-11 DIAGNOSIS — C419 Malignant neoplasm of bone and articular cartilage, unspecified: Secondary | ICD-10-CM

## 2012-08-11 LAB — CBC WITH DIFFERENTIAL/PLATELET
Eosinophils Absolute: 0 10*3/uL (ref 0.0–0.5)
MONO#: 0.4 10*3/uL (ref 0.1–0.9)
NEUT#: 1.8 10*3/uL (ref 1.5–6.5)
RBC: 3.29 10*6/uL — ABNORMAL LOW (ref 4.20–5.82)
RDW: 16.7 % — ABNORMAL HIGH (ref 11.0–14.6)
WBC: 2.8 10*3/uL — ABNORMAL LOW (ref 4.0–10.3)
lymph#: 0.5 10*3/uL — ABNORMAL LOW (ref 0.9–3.3)

## 2012-08-11 LAB — COMPREHENSIVE METABOLIC PANEL (CC13)
ALT: 14 U/L (ref 0–55)
AST: 19 U/L (ref 5–34)
Albumin: 3.3 g/dL — ABNORMAL LOW (ref 3.5–5.0)
CO2: 31 mEq/L — ABNORMAL HIGH (ref 22–29)
Calcium: 9.2 mg/dL (ref 8.4–10.4)
Chloride: 100 mEq/L (ref 98–107)
Potassium: 3.6 mEq/L (ref 3.5–5.1)
Sodium: 141 mEq/L (ref 136–145)
Total Protein: 7.1 g/dL (ref 6.4–8.3)

## 2012-08-11 LAB — LACTATE DEHYDROGENASE (CC13): LDH: 166 U/L (ref 125–245)

## 2012-08-11 MED ORDER — POTASSIUM CHLORIDE CRYS ER 20 MEQ PO TBCR: 20.0000 meq | EXTENDED_RELEASE_TABLET | Freq: Every day | ORAL | Status: DC

## 2012-08-11 NOTE — Progress Notes (Signed)
This office note has been dictated.  #[id number]

## 2012-08-11 NOTE — Telephone Encounter (Signed)
Per staff message and POF I have scheduled appts. [name]  

## 2012-08-11 NOTE — Patient Instructions (Signed)
Weekly CBC and chemo starting 08/14/12

## 2012-08-11 NOTE — Telephone Encounter (Signed)
S/w pt that Dr Arline Asp wants him to see an orthopaedic doctor and to expect a call from Dr Jeannetta Ellis office. He voiced understanding

## 2012-08-11 NOTE — Progress Notes (Signed)
CC:   [name], MD [name], M.D. [name], Ph.D., M.D. [name], M.D. [name], MD [name], MD   PROBLEM LIST:  1. IgA lambda multiple myeloma diagnosed in late October 2013. The patient  has anemia, thrombocytopenia, multiple bone lesions, renal  insufficiency, and monoclonal proteins in the serum and urine. Bone  marrow carried out on 06/24/2012 yielded 48% plasma cells by morphology.  Cytogenetic analysis revealed the presence of normal male chromosomes  with no observable clonal chromosomal abnormalities. There was a single  cell with additional chromosome material on 3q, loss of chromosome 13  and a marker chromosome. DNA probe panel disclosed a gain of chromosome  11 which is consistent with abnormalities associated with multiple  myeloma. A 24-hour urine collection on 06/27/2012 yielded 17.1 g of  protein of which 16.9 g was free lambda light chains. Beta 2  microglobulin was 5.8. Albumin 3.2. Myeloma is ISS stage III.  Chemotherapy with Velcade, Cytoxan, and Decadron was started on  07/01/2012. Radiation treatments were administered to the right humerus 25 Gy in 10 fractions from 07/10/2012 through 07/28/2012.  2. Renal insufficiency. Creatinine clearance on 06/27/2012 was 68  mL/min.  3. A 2.3 cm lytic lesion in the right humerus at risk for pathologic  Fracture. Radiation treatments were administered to the right humerus 25 Gy in 10 fractions from 07/10/2012 through 07/28/2012.    4. Hypertension.  5. Benign prostate hypertrophy and elevated prostate specific antigen,  under the care of Dr. [name]. The patient had a prostate  biopsy in 2008 that was benign.  6. Dyslipidemia.  7. Arthritis involving the cervical spine  8. Glaucoma.    MEDICATIONS:  Were reviewed and recorded.  Current Outpatient Prescriptions  Medication Sig Dispense Refill  . acetaminophen (TYLENOL) 500 MG tablet Take 1,000 mg by mouth every  6 (six) hours as needed. For pain      . acyclovir (ZOVIRAX) 400 MG tablet Take 1 tablet (400 mg total) by mouth 2 (two) times daily.  60 tablet  6  . allopurinol (ZYLOPRIM) 100 MG tablet Take 1 tablet (100 mg total) by mouth daily.  30 tablet  11  . amLODipine (NORVASC) 5 MG tablet Take 5 mg by mouth at bedtime.      Marland Kitchen atenolol-chlorthalidone (TENORETIC) 50-25 MG per tablet TAKE ONE TABLET ONCE DAILY  30 tablet  5  . b complex vitamins tablet Take 1 tablet by mouth daily.        . bimatoprost (LUMIGAN) 0.03 % ophthalmic drops Place 1 drop into both eyes at bedtime.        . brimonidine-timolol (COMBIGAN) 0.2-0.5 % ophthalmic solution Place 1 drop into both eyes 2 (two) times daily.        . clindamycin (CLEOCIN T) 1 % lotion       . esomeprazole (NEXIUM) 40 MG capsule Take 1 capsule (40 mg total) by mouth daily before breakfast.  30 capsule  11  . ondansetron (ZOFRAN) 8 MG tablet Take 1 tablet (8 mg total) by mouth every 8 (eight) hours as needed.  20 tablet  3  . prochlorperazine (COMPAZINE) 10 MG tablet Take 1 tablet (10 mg total) by mouth every 6 (six) hours as needed.  30 tablet  3  . RAPAFLO 8 MG CAPS capsule 8 mg daily with breakfast.       . simvastatin (ZOCOR) 20 MG tablet Take 20 mg by mouth at bedtime.      Marland Kitchen  sodium bicarbonate 650 MG tablet Take 2 tablets (1,300 mg total) by mouth 2 (two) times daily.  120 tablet  5  . potassium chloride SA (K-DUR,KLOR-CON) 20 MEQ tablet Take 1 tablet (20 mEq total) by mouth daily.  30 tablet  3   Pneumovax administered on 07/07/2012.  Flu shot given in November 2013.   SMOKING HISTORY: The patient has never smoked cigarettes.    HISTORY:  Trevor Michael was seen today for followup of his IgA lambda multiple myeloma with heavy proteinuria stage III.  Trevor Michael was accompanied today by his wife, Trevor Michael.  He was last seen by [place] on 07/22/2012.  He received treatment on 07/22/2012 with Cytoxan 400 mg, IV Decadron 20 mg IV and Velcade 2.5 mg  subcu.  He received Velcade and Decadron on 07/25/2012.  He was scheduled for day 8 chemotherapy on 12/10, however, his total white count was 1.8 with an ANC of 0.4 and a platelet count of 74,000.  Chemotherapy was held on that date.  The patient received Neupogen 480 mcg subcu on 07/29/2013.  On 08/01/2012 the patient received Velcade and Decadron.  White count was 12.4, ANC 9.8 and platelets were 73,000.  The patient has tolerated his treatments well.  He denies any pain.  Apparently he is having some sweating at night.  He has tolerated his treatments well except for the low white count and ANC that has been a problem getting his treatments in.  The patient says he went to church.  He is feeling much better.  He is starting to gain a little weight.  Energy is good.  PHYSICAL EXAMINATION:  Trevor Michael looks well.  Weight is 153.7 pounds. Height 5 feet 9 inches.  Body surface area 1.84 sq m.  Blood pressure 144/76.  Other vital signs are normal.  There is no scleral icterus. Mouth and pharynx are benign.  There is conjunctival injection which the patient has seen his ophthalmologist for.  Apparently he has dry eyes. There is no peripheral adenopathy palpable.  Heart and lungs are normal. Back no skeletal tenderness.  Abdomen is notable for liver edge that can be felt about 3 or 4 cm below the right costal margin.  No splenomegaly. No abdominal masses or ascites.  Extremities:  No peripheral edema or clubbing.  No tenderness over the humerus.  Neurologic exam is normal. The patient does not have a Port-A-Cath or central catheter.  LABORATORY DATA:  Today, white count 2.8, ANC 1.8, hemoglobin 11.3, hematocrit 32.1, platelets 172,000.  Chemistries were notable for an albumin of 3.3, total protein 7.1.  Globulins were therefore 3.8.  LDH 166.  On 07/22/2012 IgA level was 4280.  Lambda free light chains were 55.90.  IMAGING STUDIES:  1. MRI of the cervical spine without IV contrast on  11/07/2009 showed  disk degeneration and spondylosis, most severe at C5-C6 where there  is mild to moderate spinal stenosis. There is foraminal  encroachment at this level, left greater than right. There were  degenerative changes at other levels which were described in detail  in the body of the report.  2. Chest x-ray, 2 view, from 11/16/2009 showed no acute findings.  3. CT abdomen and pelvis without IV contrast on 06/18/2012 showed  multiple lytic bone lesions highly suspicious for metastatic  disease or multiple myeloma. The largest lesion seen with some  cortical destruction in the anterior aspect of the iliac bone  measured 3 cm. There was no evidence of colitis or  diverticulitis.  There was no evidence for hydronephrosis or hydroureter. There  were bilateral probable renal cysts. There was an enlarged  prostate gland with indentation of the urinary bladder base. There  was a small right hydrocele.  4. Metastatic bone survey from 06/19/2012 showed lytic lesions in the  calvaria and right hemipelvis and a possible lesion in the right  humerus. This latter lesion was not specifically mentioned in the  report.  5. Nuclear bone scan on 06/19/2012 showed some increased uptake  involving the femur and humerus bilaterally, possibly reflecting  changes of metabolic bone disease. There was a lack of correlation  with the bone scan and the findings seen on CT and x-rays, raising  the possibility of multiple myeloma.  6. Renal ultrasound on 06/19/2012 showed increased echogenicity of the  renal parenchyma bilaterally consistent with renal medical disease.  There was no evidence for obstruction. There was a slightly  enlarged prostate gland.  7. CT scan of the right humerus without IV contrast on 06/20/2012  showed 2.3-cm lucent lesion with probable mild endosteal thinning  anteriorly near the bicipital groove. There were several small  lytic lesions present within the humeral head,  scapula, glenoid,  and proximal radius. No pathologic fracture or soft tissue mass  was seen. There were a few small nodules within the visualized  right lung, the largest of which was 4 mm in the upper lobe on  image 78.    PROCEDURES:  1. CT-guided bone marrow aspirate and biopsy was carried out on  06/24/2012.   IMPRESSION AND PLAN:  Clinically Trevor Michael is doing well.  I am hoping that we will see a nice drop in his IgA level.  Because of his low white count today we are going to hold chemotherapy that was scheduled for tomorrow.  I am going to try to treat Trevor Michael on a weekly schedule starting Thursday December 26.  The patient will come in at that time for a CBC.  Hopefully we can treat him with Cytoxan 400 mg and Decadron 20 mg IV and Velcade 2.5 mg subcu.  We may have to cut back on the doses if we are not able to treat Trevor Michael on a weekly program.  Future treatments will be set up for January 2, January 9 and January 16 along with pre drugs and preceded by a CBC.  The patient apparently has received dental clearance.  We will also schedule Zometa into his treatment program.  I want to have the patient evaluated by Dr. Worthy Rancher at some point regarding his right humerus. We will also try to set the patient up to see Dr. [name] at [facility] for consideration of possible high-dose therapy and autologous stem cell rescue.    ______________________________ Samul Dada, M.D. DSM/MEDQ  D:  08/11/2012  T:  08/11/2012  Job:  [id number]

## 2012-08-12 ENCOUNTER — Telehealth: Payer: Self-pay | Admitting: *Deleted

## 2012-08-12 ENCOUNTER — Other Ambulatory Visit: Payer: Self-pay

## 2012-08-12 ENCOUNTER — Ambulatory Visit: Payer: Medicare Other

## 2012-08-12 DIAGNOSIS — C9000 Multiple myeloma not having achieved remission: Secondary | ICD-10-CM

## 2012-08-12 NOTE — Telephone Encounter (Signed)
Per staff message I have scheduled appt. [name]

## 2012-08-13 LAB — KAPPA/LAMBDA LIGHT CHAINS
Kappa free light chain: 0.03 mg/dL — ABNORMAL LOW (ref 0.33–1.94)
Kappa:Lambda Ratio: 0 — ABNORMAL LOW (ref 0.26–1.65)
Lambda Free Lght Chn: 23.4 mg/dL — ABNORMAL HIGH (ref 0.57–2.63)

## 2012-08-13 LAB — IGA: IgA: 438 mg/dL — ABNORMAL HIGH (ref 68–379)

## 2012-08-14 ENCOUNTER — Telehealth: Payer: Self-pay | Admitting: Oncology

## 2012-08-14 ENCOUNTER — Telehealth: Payer: Self-pay | Admitting: *Deleted

## 2012-08-14 NOTE — Telephone Encounter (Signed)
Faxed ref to dr giofree/pam to review records       Trevor Michael

## 2012-08-14 NOTE — Telephone Encounter (Signed)
Per staff message and POF I have scheduled appts.  JMW  

## 2012-08-15 ENCOUNTER — Other Ambulatory Visit (HOSPITAL_BASED_OUTPATIENT_CLINIC_OR_DEPARTMENT_OTHER): Payer: Medicare Other | Admitting: Lab

## 2012-08-15 ENCOUNTER — Ambulatory Visit (HOSPITAL_BASED_OUTPATIENT_CLINIC_OR_DEPARTMENT_OTHER): Payer: Medicare Other

## 2012-08-15 ENCOUNTER — Telehealth: Payer: Self-pay | Admitting: Oncology

## 2012-08-15 ENCOUNTER — Encounter: Payer: Self-pay | Admitting: Oncology

## 2012-08-15 ENCOUNTER — Other Ambulatory Visit: Payer: Self-pay | Admitting: Oncology

## 2012-08-15 DIAGNOSIS — Z5111 Encounter for antineoplastic chemotherapy: Secondary | ICD-10-CM

## 2012-08-15 DIAGNOSIS — C9000 Multiple myeloma not having achieved remission: Secondary | ICD-10-CM

## 2012-08-15 DIAGNOSIS — Z5112 Encounter for antineoplastic immunotherapy: Secondary | ICD-10-CM

## 2012-08-15 LAB — PROTEIN / CREATININE RATIO, URINE
Protein Creatinine Ratio: 0.05 (ref <0.15)
Total Protein, Urine: 3 mg/dL

## 2012-08-15 LAB — CBC WITH DIFFERENTIAL/PLATELET
BASO%: 0.3 % (ref 0.0–2.0)
EOS%: 0.6 % (ref 0.0–7.0)
HCT: 32.4 % — ABNORMAL LOW (ref 38.4–49.9)
MCH: 32.4 pg (ref 27.2–33.4)
MCHC: 34 g/dL (ref 32.0–36.0)
MCV: 95.6 fL (ref 79.3–98.0)
MONO%: 18.3 % — ABNORMAL HIGH (ref 0.0–14.0)
NEUT%: 48.4 % (ref 39.0–75.0)
lymph#: 1 10*3/uL (ref 0.9–3.3)

## 2012-08-15 MED ORDER — ONDANSETRON HCL 8 MG PO TABS
8.0000 mg | ORAL_TABLET | Freq: Once | ORAL | Status: AC
  Administered 2012-08-15: 8 mg via ORAL

## 2012-08-15 MED ORDER — DEXAMETHASONE SODIUM PHOSPHATE 4 MG/ML IJ SOLN
20.0000 mg | Freq: Once | INTRAMUSCULAR | Status: AC
  Administered 2012-08-15: 20 mg via INTRAVENOUS

## 2012-08-15 MED ORDER — SODIUM CHLORIDE 0.9 % IV SOLN
400.0000 mg | Freq: Once | INTRAVENOUS | Status: AC
  Administered 2012-08-15: 400 mg via INTRAVENOUS
  Filled 2012-08-15: qty 20

## 2012-08-15 MED ORDER — BORTEZOMIB CHEMO SQ INJECTION 3.5 MG (2.5MG/ML)
1.3000 mg/m2 | Freq: Once | INTRAMUSCULAR | Status: AC
  Administered 2012-08-15: 2.5 mg via SUBCUTANEOUS
  Filled 2012-08-15: qty 2.5

## 2012-08-15 NOTE — Progress Notes (Signed)
Patient received Rx today with 3 drugs.  ANC was 1.5, seems persistent despite delays in Rx.  Will add Neulasta on day 2 and change chemo schedule from weekly to every 2 weeks, i.e. Jan 9 and 23.  Patient has an appointment to see me on 09/11/12.

## 2012-08-15 NOTE — Telephone Encounter (Signed)
Pt came to get calendar gave him  calendar appt for December 2013 and January 2014,notified Vernice Jefferson regarding change of chemo appts

## 2012-08-15 NOTE — Patient Instructions (Addendum)
[facility] Discharge Instructions for Patients Receiving Chemotherapy  Today you received the following chemotherapy agents:  Cytoxan & Velcade  To help prevent nausea and vomiting after your treatment, we encourage you to take your nausea medication If you develop nausea and vomiting that is not controlled by your nausea medication, call the clinic. If it is after clinic hours your family physician or the after hours number for the clinic or go to the Emergency Department.   BELOW ARE SYMPTOMS THAT SHOULD BE REPORTED IMMEDIATELY:  *FEVER GREATER THAN 100.5 F  *CHILLS WITH OR WITHOUT FEVER  NAUSEA AND VOMITING THAT IS NOT CONTROLLED WITH YOUR NAUSEA MEDICATION  *UNUSUAL SHORTNESS OF BREATH  *UNUSUAL BRUISING OR BLEEDING  TENDERNESS IN MOUTH AND THROAT WITH OR WITHOUT PRESENCE OF ULCERS  *URINARY PROBLEMS  *BOWEL PROBLEMS  UNUSUAL RASH Items with * indicate a potential emergency and should be followed up as soon as possible.   Please let the nurse know about any problems that you may have experienced. Feel free to call the clinic you have any questions or concerns. The clinic phone number is [phone].   I have been informed and understand all the instructions given to me. I know to contact the clinic, my physician, or go to the Emergency Department if any problems should occur. I do not have any questions at this time, but understand that I may call the clinic during office hours   should I have any questions or need assistance in obtaining follow up care.    __________________________________________  _____________  __________ Signature of Patient or Authorized Representative            Date                   Time    __________________________________________ Nurse's Signature

## 2012-08-16 ENCOUNTER — Ambulatory Visit (HOSPITAL_BASED_OUTPATIENT_CLINIC_OR_DEPARTMENT_OTHER): Payer: Medicare Other

## 2012-08-16 DIAGNOSIS — C9000 Multiple myeloma not having achieved remission: Secondary | ICD-10-CM

## 2012-08-16 MED ORDER — PEGFILGRASTIM INJECTION 6 MG/0.6ML
6.0000 mg | Freq: Once | SUBCUTANEOUS | Status: AC
  Administered 2012-08-16: 6 mg via SUBCUTANEOUS

## 2012-08-18 ENCOUNTER — Other Ambulatory Visit: Payer: Self-pay | Admitting: Certified Registered Nurse Anesthetist

## 2012-08-21 ENCOUNTER — Other Ambulatory Visit (HOSPITAL_BASED_OUTPATIENT_CLINIC_OR_DEPARTMENT_OTHER): Payer: Medicare Other | Admitting: Lab

## 2012-08-21 ENCOUNTER — Ambulatory Visit: Payer: Medicare Other

## 2012-08-21 DIAGNOSIS — C9000 Multiple myeloma not having achieved remission: Secondary | ICD-10-CM

## 2012-08-21 LAB — CBC WITH DIFFERENTIAL/PLATELET
EOS%: 0.6 % (ref 0.0–7.0)
LYMPH%: 6.1 % — ABNORMAL LOW (ref 14.0–49.0)
MCH: 34.3 pg — ABNORMAL HIGH (ref 27.2–33.4)
MCV: 99.4 fL — ABNORMAL HIGH (ref 79.3–98.0)
MONO%: 12.6 % (ref 0.0–14.0)
Platelets: 194 10*3/uL (ref 140–400)
RBC: 3.69 10*6/uL — ABNORMAL LOW (ref 4.20–5.82)
RDW: 16.4 % — ABNORMAL HIGH (ref 11.0–14.6)

## 2012-08-27 ENCOUNTER — Telehealth: Payer: Self-pay | Admitting: Oncology

## 2012-08-27 NOTE — Telephone Encounter (Signed)
Per dr Ciro Backer office they tried to make contact with the pt on 12/30 but no appt has been made yet.       Trevor Michael

## 2012-08-28 ENCOUNTER — Other Ambulatory Visit (HOSPITAL_BASED_OUTPATIENT_CLINIC_OR_DEPARTMENT_OTHER): Payer: Medicare Other | Admitting: Lab

## 2012-08-28 ENCOUNTER — Ambulatory Visit (HOSPITAL_BASED_OUTPATIENT_CLINIC_OR_DEPARTMENT_OTHER): Payer: Medicare Other

## 2012-08-28 VITALS — BP 129/56 | HR 56 | Temp 97.6°F

## 2012-08-28 DIAGNOSIS — C9000 Multiple myeloma not having achieved remission: Secondary | ICD-10-CM

## 2012-08-28 DIAGNOSIS — Z5111 Encounter for antineoplastic chemotherapy: Secondary | ICD-10-CM

## 2012-08-28 DIAGNOSIS — Z5112 Encounter for antineoplastic immunotherapy: Secondary | ICD-10-CM

## 2012-08-28 LAB — CBC WITH DIFFERENTIAL/PLATELET
BASO%: 0.2 % (ref 0.0–2.0)
Eosinophils Absolute: 0.1 10*3/uL (ref 0.0–0.5)
LYMPH%: 13.9 % — ABNORMAL LOW (ref 14.0–49.0)
MCHC: 34.1 g/dL (ref 32.0–36.0)
MCV: 95.9 fL (ref 79.3–98.0)
MONO%: 7.7 % (ref 0.0–14.0)
NEUT#: 6.4 10*3/uL (ref 1.5–6.5)
RBC: 4.19 10*6/uL — ABNORMAL LOW (ref 4.20–5.82)
RDW: 15 % — ABNORMAL HIGH (ref 11.0–14.6)
WBC: 8.2 10*3/uL (ref 4.0–10.3)
nRBC: 0 % (ref 0–0)

## 2012-08-28 MED ORDER — DEXAMETHASONE SODIUM PHOSPHATE 10 MG/ML IJ SOLN
20.0000 mg | Freq: Once | INTRAMUSCULAR | Status: AC
  Administered 2012-08-28: 20 mg via INTRAVENOUS

## 2012-08-28 MED ORDER — BORTEZOMIB CHEMO SQ INJECTION 3.5 MG (2.5MG/ML)
1.3000 mg/m2 | Freq: Once | INTRAMUSCULAR | Status: AC
  Administered 2012-08-28: 2.5 mg via SUBCUTANEOUS
  Filled 2012-08-28: qty 2.5

## 2012-08-28 MED ORDER — SODIUM CHLORIDE 0.9 % IV SOLN
400.0000 mg | Freq: Once | INTRAVENOUS | Status: AC
  Administered 2012-08-28: 400 mg via INTRAVENOUS
  Filled 2012-08-28: qty 20

## 2012-08-28 MED ORDER — ONDANSETRON HCL 8 MG PO TABS
8.0000 mg | ORAL_TABLET | Freq: Once | ORAL | Status: AC
  Administered 2012-08-28: 8 mg via ORAL

## 2012-08-28 NOTE — Progress Notes (Signed)
Pt has orders for IV and PO Decadron.  Pt did not take PO decadron at home.  Per pharmacy give 40 mg Decadron IV instead of both PO and IV.

## 2012-08-28 NOTE — Patient Instructions (Addendum)
[facility] Discharge Instructions for Patients Receiving Chemotherapy  Today you received the following chemotherapy agents Velcade, Cytoxan  To help prevent nausea and vomiting after your treatment, we encourage you to take your nausea medication as prescribed. Begin taking it as directed and take it as often as prescribed for the next 48-72 hours.   If you develop nausea and vomiting that is not controlled by your nausea medication, call the clinic. If it is after clinic hours your family physician or the after hours number for the clinic or go to the Emergency Department.   BELOW ARE SYMPTOMS THAT SHOULD BE REPORTED IMMEDIATELY:  *FEVER GREATER THAN 100.5 F  *CHILLS WITH OR WITHOUT FEVER  NAUSEA AND VOMITING THAT IS NOT CONTROLLED WITH YOUR NAUSEA MEDICATION  *UNUSUAL SHORTNESS OF BREATH  *UNUSUAL BRUISING OR BLEEDING  TENDERNESS IN MOUTH AND THROAT WITH OR WITHOUT PRESENCE OF ULCERS  *URINARY PROBLEMS  *BOWEL PROBLEMS  UNUSUAL RASH Items with * indicate a potential emergency and should be followed up as soon as possible.  One of the nurses will contact you 24 hours after your treatment. Please let the nurse know about any problems that you may have experienced. Feel free to call the clinic you have any questions or concerns. The clinic phone number is [phone].   I have been informed and understand all the instructions given to me. I know to contact the clinic, my physician, or go to the Emergency Department if any problems should occur. I do not have any questions at this time, but understand that I may call the clinic during office hours   should I have any questions or need assistance in obtaining follow up care.    __________________________________________  _____________  __________ Signature of Patient or Authorized Representative            Date                   Time    __________________________________________ Nurse's Signature

## 2012-08-29 ENCOUNTER — Ambulatory Visit (HOSPITAL_BASED_OUTPATIENT_CLINIC_OR_DEPARTMENT_OTHER): Payer: Medicare Other

## 2012-08-29 VITALS — BP 151/74 | HR 74 | Temp 97.0°F

## 2012-08-29 DIAGNOSIS — C9000 Multiple myeloma not having achieved remission: Secondary | ICD-10-CM

## 2012-08-29 MED ORDER — PEGFILGRASTIM INJECTION 6 MG/0.6ML
6.0000 mg | Freq: Once | SUBCUTANEOUS | Status: AC
  Administered 2012-08-29: 6 mg via SUBCUTANEOUS
  Filled 2012-08-29: qty 0.6

## 2012-09-01 ENCOUNTER — Other Ambulatory Visit: Payer: Self-pay | Admitting: Internal Medicine

## 2012-09-01 ENCOUNTER — Ambulatory Visit
Admission: RE | Admit: 2012-09-01 | Discharge: 2012-09-01 | Disposition: A | Discharge status: Home / Self Care | Payer: Medicare Other | Source: Ambulatory Visit | Attending: Radiation Oncology | Admitting: Radiation Oncology

## 2012-09-01 ENCOUNTER — Encounter: Payer: Self-pay | Admitting: Radiation Oncology

## 2012-09-01 VITALS — BP 151/70 | HR 71 | Temp 98.0°F | Resp 16 | Wt 156.8 lb

## 2012-09-01 DIAGNOSIS — C9000 Multiple myeloma not having achieved remission: Secondary | ICD-10-CM

## 2012-09-01 NOTE — Progress Notes (Signed)
Patient presents to the clinic today for a follow up appointment with Dr. Roselind Messier. Patient alert and oriented to person, place, and time. No distress noted. Steady gait noted. Pleasant affect noted. Patient denies pain in right humerus. Patient demonstrates full ROM of right arm. Patient reports that only occasionally when he moves his right arm quickly and in a certain direction does he feel a twinge but, denies pain. Patient denies numbness, tingling or swelling of upper extremities. Patient reports his appetite has greatly improved and weight gain evident. Patient reports that his last infusion was Thursday. Patient scheduled to see Murinson again 09/12/2011. Patient reports on average he gets up twice during the night to void. Patient only taking rapaflo on a prn basis. Patient denies nausea, vomiting, headache, or dizziness. Patient reports that he feels like his energy level "is pretty good." Reported all findings To Dr. Roselind Messier.

## 2012-09-01 NOTE — Progress Notes (Signed)
Radiation Oncology         [phone] ________________________________  Name: Trevor Michael MRN: [id number]  Date: 09/01/2012  DOB: [birth year]  Follow-Up Visit Note  CC: [name], MD  Murinson, Gerarda Fraction, MD  Diagnosis:   Multiple myeloma  Interval Since Last Radiation:  1 months  Narrative:  The patient returns today for routine follow-up.  He is doing well at this time. He denies any pain along his right humerus or other areas. Patient is on chemotherapy and seems to be tolerating this well. he denies any problems with swelling in his right arm or hand.                              ALLERGIES:   has no known allergies.  Meds: Current Outpatient Prescriptions  Medication Sig Dispense Refill  . acetaminophen (TYLENOL) 500 MG tablet Take 1,000 mg by mouth every 6 (six) hours as needed. For pain      . acyclovir (ZOVIRAX) 400 MG tablet Take 1 tablet (400 mg total) by mouth 2 (two) times daily.  60 tablet  6  . allopurinol (ZYLOPRIM) 100 MG tablet Take 1 tablet (100 mg total) by mouth daily.  30 tablet  11  . amLODipine (NORVASC) 5 MG tablet Take 5 mg by mouth at bedtime.      Marland Kitchen atenolol-chlorthalidone (TENORETIC) 50-25 MG per tablet TAKE ONE TABLET ONCE DAILY  30 tablet  5  . bimatoprost (LUMIGAN) 0.03 % ophthalmic drops Place 1 drop into both eyes at bedtime.        . brimonidine-timolol (COMBIGAN) 0.2-0.5 % ophthalmic solution Place 1 drop into both eyes 2 (two) times daily.        Marland Kitchen esomeprazole (NEXIUM) 40 MG capsule Take 1 capsule (40 mg total) by mouth daily before breakfast.  30 capsule  11  . potassium chloride SA (K-DUR,KLOR-CON) 20 MEQ tablet Take 1 tablet (20 mEq total) by mouth daily.  30 tablet  3  . RAPAFLO 8 MG CAPS capsule 8 mg daily with breakfast.       . simvastatin (ZOCOR) 20 MG tablet Take 20 mg by mouth at bedtime.      . simvastatin (ZOCOR) 20 MG tablet TAKE 1 TABLET (20 MG TOTAL) BY MOUTH AT BEDTIME.  30 tablet  5  . sodium bicarbonate 650 MG tablet Take  2 tablets (1,300 mg total) by mouth 2 (two) times daily.  120 tablet  5  . b complex vitamins tablet Take 1 tablet by mouth daily.        . clindamycin (CLEOCIN T) 1 % lotion       . ondansetron (ZOFRAN) 8 MG tablet Take 1 tablet (8 mg total) by mouth every 8 (eight) hours as needed.  20 tablet  3  . prochlorperazine (COMPAZINE) 10 MG tablet Take 1 tablet (10 mg total) by mouth every 6 (six) hours as needed.  30 tablet  3    Physical Findings: The patient is in no acute distress. Patient is alert and oriented.  weight is 156 lb 12.8 oz (71.124 kg). His oral temperature is 98 F (36.7 C). His blood pressure is 151/70 and his pulse is 71. His respiration is 16 and oxygen saturation is 100%. .  The lungs are clear to auscultation. The heart has a regular rhythm and rate. Palpation along the right upper arm area reveals no point tenderness. Motor she is 5 out of  5 in the proximal and distal muscle groups of the upper extremities.  Patient has good  range of movement of his right shoulder area.  Lab Findings: Lab Results  Component Value Date   WBC 8.2 08/28/2012   HGB 13.7 08/28/2012   HCT 40.2 08/28/2012   MCV 95.9 08/28/2012   PLT 157 08/28/2012      Radiographic Findings: No results found.  Impression:  The patient is recovering from the effects of radiation.  He has no pain in his right humerus  Plan:  When necessary followup. Patient will continue close followup in medical oncology.  _____________________________________  -----------------------------------  [name], PhD, MD

## 2012-09-04 ENCOUNTER — Ambulatory Visit: Payer: Medicare Other

## 2012-09-04 ENCOUNTER — Other Ambulatory Visit (HOSPITAL_BASED_OUTPATIENT_CLINIC_OR_DEPARTMENT_OTHER): Payer: Medicare Other

## 2012-09-04 DIAGNOSIS — C9000 Multiple myeloma not having achieved remission: Secondary | ICD-10-CM

## 2012-09-04 LAB — CBC WITH DIFFERENTIAL/PLATELET
BASO%: 0.3 % (ref 0.0–2.0)
EOS%: 0.4 % (ref 0.0–7.0)
HCT: 38.9 % (ref 38.4–49.9)
MCH: 33.2 pg (ref 27.2–33.4)
MCHC: 33.8 g/dL (ref 32.0–36.0)
MONO#: 2.8 10*3/uL — ABNORMAL HIGH (ref 0.1–0.9)
NEUT%: 84.6 % — ABNORMAL HIGH (ref 39.0–75.0)
RBC: 3.96 10*6/uL — ABNORMAL LOW (ref 4.20–5.82)
RDW: 16 % — ABNORMAL HIGH (ref 11.0–14.6)
WBC: 28.3 10*3/uL — ABNORMAL HIGH (ref 4.0–10.3)
lymph#: 1.4 10*3/uL (ref 0.9–3.3)

## 2012-09-06 ENCOUNTER — Other Ambulatory Visit: Payer: Self-pay | Admitting: Oncology

## 2012-09-09 ENCOUNTER — Other Ambulatory Visit: Payer: Self-pay | Admitting: Medical Oncology

## 2012-09-09 ENCOUNTER — Telehealth: Payer: Self-pay | Admitting: Oncology

## 2012-09-09 NOTE — Telephone Encounter (Signed)
S/W THE PT AND HE IS AWARE OF HIS INJECTION APPT ON Friday 09/12/2012

## 2012-09-11 ENCOUNTER — Other Ambulatory Visit: Payer: Medicare Other

## 2012-09-11 ENCOUNTER — Ambulatory Visit (HOSPITAL_BASED_OUTPATIENT_CLINIC_OR_DEPARTMENT_OTHER): Payer: Medicare Other

## 2012-09-11 ENCOUNTER — Other Ambulatory Visit (HOSPITAL_BASED_OUTPATIENT_CLINIC_OR_DEPARTMENT_OTHER): Payer: Medicare Other | Admitting: Lab

## 2012-09-11 ENCOUNTER — Telehealth: Payer: Self-pay | Admitting: *Deleted

## 2012-09-11 ENCOUNTER — Encounter: Payer: Medicare Other | Admitting: Nutrition

## 2012-09-11 ENCOUNTER — Ambulatory Visit (HOSPITAL_BASED_OUTPATIENT_CLINIC_OR_DEPARTMENT_OTHER): Payer: Medicare Other | Admitting: Oncology

## 2012-09-11 ENCOUNTER — Telehealth: Payer: Self-pay | Admitting: Oncology

## 2012-09-11 ENCOUNTER — Encounter: Payer: Self-pay | Admitting: Oncology

## 2012-09-11 VITALS — BP 154/73 | HR 63 | Temp 98.1°F | Resp 18 | Ht 69.0 in | Wt 157.3 lb

## 2012-09-11 DIAGNOSIS — N289 Disorder of kidney and ureter, unspecified: Secondary | ICD-10-CM

## 2012-09-11 DIAGNOSIS — I10 Essential (primary) hypertension: Secondary | ICD-10-CM

## 2012-09-11 DIAGNOSIS — C9000 Multiple myeloma not having achieved remission: Secondary | ICD-10-CM

## 2012-09-11 DIAGNOSIS — Z5111 Encounter for antineoplastic chemotherapy: Secondary | ICD-10-CM

## 2012-09-11 DIAGNOSIS — Z5112 Encounter for antineoplastic immunotherapy: Secondary | ICD-10-CM

## 2012-09-11 DIAGNOSIS — M899 Disorder of bone, unspecified: Secondary | ICD-10-CM

## 2012-09-11 LAB — COMPREHENSIVE METABOLIC PANEL (CC13)
Albumin: 3.5 g/dL (ref 3.5–5.0)
CO2: 28 mEq/L (ref 22–29)
Calcium: 9.3 mg/dL (ref 8.4–10.4)
Chloride: 101 mEq/L (ref 98–107)
Glucose: 95 mg/dl (ref 70–99)
Potassium: 3.4 mEq/L — ABNORMAL LOW (ref 3.5–5.1)
Sodium: 140 mEq/L (ref 136–145)
Total Bilirubin: 0.36 mg/dL (ref 0.20–1.20)
Total Protein: 7.9 g/dL (ref 6.4–8.3)

## 2012-09-11 LAB — CBC WITH DIFFERENTIAL/PLATELET
Basophils Absolute: 0 10*3/uL (ref 0.0–0.1)
EOS%: 0.3 % (ref 0.0–7.0)
HGB: 13.1 g/dL (ref 13.0–17.1)
MCH: 32.6 pg (ref 27.2–33.4)
MCV: 95.8 fL (ref 79.3–98.0)
MONO%: 14.3 % — ABNORMAL HIGH (ref 0.0–14.0)
RDW: 14.7 % — ABNORMAL HIGH (ref 11.0–14.6)

## 2012-09-11 LAB — URIC ACID (CC13): Uric Acid, Serum: 5.7 mg/dl (ref 2.6–7.4)

## 2012-09-11 LAB — LACTATE DEHYDROGENASE (CC13): LDH: 233 U/L (ref 125–245)

## 2012-09-11 MED ORDER — ONDANSETRON HCL 8 MG PO TABS
8.0000 mg | ORAL_TABLET | Freq: Once | ORAL | Status: AC
  Administered 2012-09-11: 8 mg via ORAL

## 2012-09-11 MED ORDER — SODIUM CHLORIDE 0.9 % IJ SOLN
10.0000 mL | INTRAMUSCULAR | Status: DC | PRN
  Filled 2012-09-11: qty 10

## 2012-09-11 MED ORDER — DEXAMETHASONE SODIUM PHOSPHATE 4 MG/ML IJ SOLN
20.0000 mg | Freq: Once | INTRAMUSCULAR | Status: AC
  Administered 2012-09-11: 20 mg via INTRAVENOUS

## 2012-09-11 MED ORDER — SODIUM CHLORIDE 0.9 % IV SOLN
4.0000 mg | Freq: Once | INTRAVENOUS | Status: AC
  Administered 2012-09-11: 4 mg via INTRAVENOUS
  Filled 2012-09-11: qty 5

## 2012-09-11 MED ORDER — SODIUM CHLORIDE 0.9 % IV SOLN
400.0000 mg | Freq: Once | INTRAVENOUS | Status: AC
  Administered 2012-09-11: 400 mg via INTRAVENOUS
  Filled 2012-09-11: qty 20

## 2012-09-11 MED ORDER — BORTEZOMIB CHEMO SQ INJECTION 3.5 MG (2.5MG/ML)
1.3000 mg/m2 | Freq: Once | INTRAMUSCULAR | Status: AC
  Administered 2012-09-11: 2.5 mg via SUBCUTANEOUS
  Filled 2012-09-11: qty 2.5

## 2012-09-11 NOTE — Telephone Encounter (Signed)
Per staff message and POF I have scheduled appts.  JMW  

## 2012-09-11 NOTE — Patient Instructions (Signed)
Appointments with Drs. Gioffre and Gasparetto ([facility]).  Continue with labs, chemo every 2 weeks.  Neulasta the day after chemo.   We'll see you in 4 weeks.

## 2012-09-11 NOTE — Telephone Encounter (Signed)
Talked to patient tonight and he has February calendar with the exception of MD visit on 10/09/12, still waiting for an MD spot

## 2012-09-11 NOTE — Patient Instructions (Addendum)
[facility] Discharge Instructions for Patients Receiving Chemotherapy  Today you received the following chemotherapy agents velcade/cytoxan/zometa  To help prevent nausea and vomiting after your treatment, we encourage you to take your nausea medication  and take it as often as prescribed   If you develop nausea and vomiting that is not controlled by your nausea medication, call the clinic. If it is after clinic hours your family physician or the after hours number for the clinic or go to the Emergency Department.   BELOW ARE SYMPTOMS THAT SHOULD BE REPORTED IMMEDIATELY:  *FEVER GREATER THAN 100.5 F  *CHILLS WITH OR WITHOUT FEVER  NAUSEA AND VOMITING THAT IS NOT CONTROLLED WITH YOUR NAUSEA MEDICATION  *UNUSUAL SHORTNESS OF BREATH  *UNUSUAL BRUISING OR BLEEDING  TENDERNESS IN MOUTH AND THROAT WITH OR WITHOUT PRESENCE OF ULCERS  *URINARY PROBLEMS  *BOWEL PROBLEMS  UNUSUAL RASH Items with * indicate a potential emergency and should be followed up as soon as possible.  One of the nurses will contact you 24 hours after your treatment. Please let the nurse know about any problems that you may have experienced. Feel free to call the clinic you have any questions or concerns. The clinic phone number is [phone].   I have been informed and understand all the instructions given to me. I know to contact the clinic, my physician, or go to the Emergency Department if any problems should occur. I do not have any questions at this time, but understand that I may call the clinic during office hours   should I have any questions or need assistance in obtaining follow up care.    __________________________________________  _____________  __________ Signature of Patient or Authorized Representative            Date                   Time    __________________________________________ Nurse's Signature

## 2012-09-11 NOTE — Progress Notes (Signed)
This office note has been dictated.  #[id number]

## 2012-09-12 ENCOUNTER — Ambulatory Visit (HOSPITAL_BASED_OUTPATIENT_CLINIC_OR_DEPARTMENT_OTHER): Payer: Medicare Other

## 2012-09-12 ENCOUNTER — Telehealth: Payer: Self-pay | Admitting: Medical Oncology

## 2012-09-12 VITALS — BP 145/70 | HR 78 | Temp 98.1°F | Wt 159.2 lb

## 2012-09-12 DIAGNOSIS — C9000 Multiple myeloma not having achieved remission: Secondary | ICD-10-CM

## 2012-09-12 LAB — IGA: IgA: 2280 mg/dL — ABNORMAL HIGH (ref 68–379)

## 2012-09-12 MED ORDER — PEGFILGRASTIM INJECTION 6 MG/0.6ML
6.0000 mg | Freq: Once | SUBCUTANEOUS | Status: AC
  Administered 2012-09-12: 6 mg via SUBCUTANEOUS
  Filled 2012-09-12: qty 0.6

## 2012-09-12 NOTE — Telephone Encounter (Signed)
I called pt to see if he is taking his K-Dur 20 meq daily. He states that he is. Per Dr. Arline Asp he needs to take (1) twice a day for 2 days then go back to one daily. He voiced understanding.

## 2012-09-12 NOTE — Progress Notes (Signed)
CC:   [name], MD [name], M.D. [name], Ph.D., M.D. [name], M.D. [name], MD [name], MD  PROBLEM LIST:  1. IgA lambda multiple myeloma diagnosed in late October 2013. The patient  has anemia, thrombocytopenia, multiple bone lesions, renal  insufficiency, and monoclonal proteins in the serum and urine. Bone  marrow carried out on 06/24/2012 yielded 48% plasma cells by morphology.  Cytogenetic analysis revealed the presence of normal male chromosomes  with no observable clonal chromosomal abnormalities. There was a single  cell with additional chromosome material on 3q, loss of chromosome 13  and a marker chromosome. DNA probe panel disclosed a gain of chromosome  11 which is consistent with abnormalities associated with multiple  myeloma. A 24-hour urine collection on 06/27/2012 yielded 17.1 g of  protein of which 16.9 g was free lambda light chains. Beta 2  microglobulin was 5.8. Albumin 3.2. Myeloma is ISS stage III.  Chemotherapy with Velcade, Cytoxan, and Decadron was started on  07/01/2012. Radiation treatments were administered to the right humerus 25 Gy in 10 fractions from 07/10/2012 through 07/28/2012. Neulasta was started on 08/16/2012 because of leukopenia and neutropenia.  Zometa was started on 09/11/2012 after the patient had received dental clearance.   2. Renal insufficiency. Creatinine clearance on 06/27/2012 was 68  mL/min.   3. A 2.3 cm lytic lesion in the right humerus at risk for pathologic  Fracture. Radiation treatments were administered to the right humerus 25 Gy in 10 fractions from 07/10/2012 through 07/28/2012.   4. Hypertension.  5. Benign prostate hypertrophy and elevated prostate specific antigen,  under the care of Dr. [name]. The patient had a prostate  biopsy in 2008 that was benign.  6. Dyslipidemia.  7. Arthritis involving the cervical spine  8. Glaucoma.   MEDICATIONS:  Reviewed and  recorded. Current Outpatient Prescriptions  Medication Sig Dispense Refill  . acetaminophen (TYLENOL) 500 MG tablet Take 1,000 mg by mouth every 6 (six) hours as needed. For pain      . acyclovir (ZOVIRAX) 400 MG tablet Take 1 tablet (400 mg total) by mouth 2 (two) times daily.  60 tablet  6  . allopurinol (ZYLOPRIM) 100 MG tablet Take 1 tablet (100 mg total) by mouth daily.  30 tablet  11  . amLODipine (NORVASC) 5 MG tablet Take 5 mg by mouth at bedtime.      Marland Kitchen atenolol-chlorthalidone (TENORETIC) 50-25 MG per tablet TAKE ONE TABLET ONCE DAILY  30 tablet  5  . b complex vitamins tablet Take 1 tablet by mouth daily.        . bimatoprost (LUMIGAN) 0.03 % ophthalmic drops Place 1 drop into both eyes at bedtime.        . brimonidine-timolol (COMBIGAN) 0.2-0.5 % ophthalmic solution Place 1 drop into both eyes 2 (two) times daily.        . clindamycin (CLEOCIN T) 1 % lotion       . esomeprazole (NEXIUM) 40 MG capsule Take 1 capsule (40 mg total) by mouth daily before breakfast.  30 capsule  11  . ondansetron (ZOFRAN) 8 MG tablet Take 1 tablet (8 mg total) by mouth every 8 (eight) hours as needed.  20 tablet  3  . potassium chloride SA (K-DUR,KLOR-CON) 20 MEQ tablet Take 1 tablet (20 mEq total) by mouth daily.  30 tablet  3  . prochlorperazine (COMPAZINE) 10 MG tablet Take 1 tablet (10 mg total) by mouth every  6 (six) hours as needed.  30 tablet  3  . RAPAFLO 8 MG CAPS capsule 8 mg daily with breakfast.       . simvastatin (ZOCOR) 20 MG tablet TAKE 1 TABLET (20 MG TOTAL) BY MOUTH AT BEDTIME.  30 tablet  5  . sodium bicarbonate 650 MG tablet Take 2 tablets (1,300 mg total) by mouth 2 (two) times daily.  120 tablet  5   Neulasta was started on 08/16/2012.  Zometa 4 mg IV was started on 09/11/2012 and will be administered every 4 weeks.   IMMUNIZATIONS: 1. Pneumovax was administered on 07/07/2012. 2. Flu shot was given in November 2013.  SMOKING HISTORY:  The patient has never smoked  cigarettes.   HISTORY:  I saw Dakarai Mcglocklin today for followup of his IgA lambda multiple myeloma with heavy proteinuria stage III.  Mr. Moccia was accompanied by his wife, [name].  He was last seen by [place] on 08/11/2012.  He received chemotherapy on 08/01/2012 consisting of Velcade and Decadron only.  Cytoxan was held on that date because the patient's platelet count was 73,000.  On 08/11/2012 the white count was 2.8 with an ANC of 1.8.  We decided to hold chemotherapy on that date and have the patient come back on December 27th.  On 12/27 the white count was still 3.1 with an ANC of 1.5.  The platelet count had rebounded to 267, and the hemoglobin was 11.0.  At that point, we decided to treat the patient every 2 weeks.  We also decided to give him Neulasta.  Therefore, on 12/27 Mr. Sorter received all 3 drugs specifically Velcade 2.5 mg subcutaneously, Cytoxan 400 mg IV and Decadron 20 mg IV.  He came back on December 28th and received Neulasta 6 mg subcutaneous.  He had a similar treatment on January 9th and 10th. He has tolerated his treatments extremely well.  He denies any neuropathy, hair loss, any pain or any sense of ill health.  His appetite seems to be improving and his weight has increased slowly. Energy is good.  The patient is getting out, although being careful to avoid exposure to infections.  He is without complaints today.  He is due for another dose of chemotherapy.  Mr. Matherne did obtain dental clearance.  He saw his ophthalmologist recently and he is going to be having laser surgery on his left eye in a few weeks.  He has also seen Dr. Isabel Caprice who has noted an increase in the PSA from 5 to 7.  A followup is planned for 6 months.  Dr. Isabel Caprice is being conservative given the patient's multiple myeloma.  The patient has not had any bleeding, bruising or infections.  No fever.  He tried to make an appointment with Dr. Darrelyn Hillock but there were some difficulties.  I  have encouraged the patient to call Dr. Jeannetta Ellis office.  In addition, we are going to set up Mr. Farfan for an appointment to see Dr. Barbaraann Boys regarding the question of high-dose therapy.  PHYSICAL EXAMINATION:  Ms. Kauth looks well.  Weight is 157.3 pounds. Height 5 feet 9 inches.  Body surface area 1.86 m2.  Blood pressure 154/73.  Other vital signs are normal.  There was no scleral icterus. There is marked conjunctival injection in both eyes.  Mouth and pharynx are benign.  Heart and lungs are normal.  Abdomen was benign.  In the past I have felt the liver edge 3 cm or 4 cm below the right costal  margin.  Extremities:  No peripheral edema or clubbing.  No tenderness. No musculoskeletal tenderness.  Neurologic:  Normal.  The patient does not have a Port-A-Cath or central catheter.  LABORATORY DATA:  White count 7.5, ANC 5.1, hemoglobin 13.1, hematocrit 38.5, and platelets 172,000.  Chemistries today notable for an albumin of 3.3, BUN 21.0, creatinine 0.8.  Total protein is 7.1, and thus globulins are 3.8.  IgA level on 08/11/2012 was reported at 438 whereas it had been 4280 on 07/22/2012.  The lambda free light chains on 08/11/2012 were 23.40 as compared with 55.90 on 07/22/2012.  The urine protein to creatinine ratio on 12/23 was 0.05.   IMAGING STUDIES:  1. 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 There is foraminal  encroachment at this level, left greater than right. There were  degenerative changes at other levels which were described in detail  in the body of the report.  2. Chest x-ray, 2 view, from 11/16/2009 showed no acute findings.  3. CT abdomen and pelvis without IV contrast on 06/18/2012 showed  multiple lytic bone lesions highly suspicious for metastatic  disease or multiple myeloma. The largest lesion seen with some  cortical  destruction in the anterior aspect of the iliac bone  measured 3 cm. There was no evidence of colitis or diverticulitis.  There was no evidence for hydronephrosis or hydroureter. There  were bilateral probable renal cysts. There was an enlarged  prostate gland with indentation of the urinary bladder base. There  was a small right hydrocele.  4. Metastatic bone survey from 06/19/2012 showed lytic lesions in the  calvaria and right hemipelvis and a possible lesion in the right  humerus. This latter lesion was not specifically mentioned in the  report.  5. Nuclear bone scan on 06/19/2012 showed some increased uptake  involving the femur and humerus bilaterally, possibly reflecting  changes of metabolic bone disease. There was a lack of correlation  with the bone scan and the findings seen on CT and x-rays, raising  the possibility of multiple myeloma.  6. Renal ultrasound on 06/19/2012 showed increased echogenicity of the  renal parenchyma bilaterally consistent with renal medical disease.  There was no evidence for obstruction. There was a slightly  enlarged prostate gland.  7. CT scan of the right humerus without IV contrast on 06/20/2012  showed 2.3-cm lucent lesion with probable mild endosteal thinning  anteriorly near the bicipital groove. There were several small  lytic lesions present within the humeral head, scapula, glenoid,  and proximal radius. No pathologic fracture or soft tissue mass  was seen. There were a few small nodules within the visualized  right lung, the largest of which was 4 mm in the upper lobe on  image 78.    PROCEDURES:  1. CT-guided bone marrow aspirate and biopsy was carried out on  06/24/2012.   IMPRESSION AND PLAN:  Mr. Babe continues to do well both clinically and from a chemical standpoint.  I think he has had an excellent response to treatment.  Currently we are treating him every 2 weeks.  He is due for treatment today.  Doses are as follows:   Velcade 2.5 mg subcutaneously, Cytoxan 400 mg IV, Decadron 4 mg IV.  Mr. Huffaker will receive his first dose of Zometa today for bone strengthening 4 mg IV. He will return in 2 weeks, which will  be February 6th at which time he will have chemotherapy if blood counts are okay.  He will come back the day following chemotherapy for Neulasta 6 mg subcutaneous.  We will plan to see Mr. Kosik again in 4 weeks which will be February 20th, at which time we will check CBC, chemistries, IgA level and serum for light chains.  The patient has been encouraged to call Dr. Darrelyn Hillock for an appointment. We will try to set up an appointment with Dr. Barbaraann Boys for consideration of high-dose therapy.  Mr. Zacharia apparently will be having some laser surgery on his left eye for glaucoma.  His pressure in his eye is apparently increasing.    ______________________________ Samul Dada, M.D. DSM/MEDQ  D:  09/11/2012  T:  09/12/2012  Job:  [id number]

## 2012-09-15 ENCOUNTER — Telehealth: Payer: Self-pay | Admitting: Oncology

## 2012-09-15 NOTE — Telephone Encounter (Signed)
Pt appt with Dr. Barbaraann Boys @ [facility] 10/02/12@9 :00. Medical records faxed,slides will be fedex'ed. Pt is aware.

## 2012-09-17 ENCOUNTER — Telehealth: Payer: Self-pay | Admitting: Oncology

## 2012-09-17 NOTE — Telephone Encounter (Signed)
Called pt and left message regarding appts for February 2014, advised pt to get calendar on 1/30 when he gets here

## 2012-09-18 ENCOUNTER — Other Ambulatory Visit (HOSPITAL_BASED_OUTPATIENT_CLINIC_OR_DEPARTMENT_OTHER): Payer: Medicare Other | Admitting: Lab

## 2012-09-18 DIAGNOSIS — C9000 Multiple myeloma not having achieved remission: Secondary | ICD-10-CM

## 2012-09-18 LAB — CBC WITH DIFFERENTIAL/PLATELET
Basophils Absolute: 0 10*3/uL (ref 0.0–0.1)
Eosinophils Absolute: 0.2 10*3/uL (ref 0.0–0.5)
HCT: 39.3 % (ref 38.4–49.9)
LYMPH%: 6.2 % — ABNORMAL LOW (ref 14.0–49.0)
MCHC: 34 g/dL (ref 32.0–36.0)
MONO#: 1.3 10*3/uL — ABNORMAL HIGH (ref 0.1–0.9)
NEUT#: 16.3 10*3/uL — ABNORMAL HIGH (ref 1.5–6.5)
NEUT%: 85.9 % — ABNORMAL HIGH (ref 39.0–75.0)
Platelets: 187 10*3/uL (ref 140–400)
WBC: 19 10*3/uL — ABNORMAL HIGH (ref 4.0–10.3)

## 2012-09-25 ENCOUNTER — Ambulatory Visit: Payer: Medicare Other | Admitting: Nutrition

## 2012-09-25 ENCOUNTER — Other Ambulatory Visit (HOSPITAL_BASED_OUTPATIENT_CLINIC_OR_DEPARTMENT_OTHER): Payer: Medicare Other | Admitting: Lab

## 2012-09-25 ENCOUNTER — Ambulatory Visit (HOSPITAL_BASED_OUTPATIENT_CLINIC_OR_DEPARTMENT_OTHER): Payer: Medicare Other

## 2012-09-25 VITALS — BP 164/82 | HR 57 | Temp 97.9°F

## 2012-09-25 DIAGNOSIS — Z5111 Encounter for antineoplastic chemotherapy: Secondary | ICD-10-CM

## 2012-09-25 DIAGNOSIS — C9000 Multiple myeloma not having achieved remission: Secondary | ICD-10-CM

## 2012-09-25 DIAGNOSIS — Z5112 Encounter for antineoplastic immunotherapy: Secondary | ICD-10-CM

## 2012-09-25 LAB — CBC WITH DIFFERENTIAL/PLATELET
BASO%: 0.2 % (ref 0.0–2.0)
EOS%: 0.3 % (ref 0.0–7.0)
HCT: 38.7 % (ref 38.4–49.9)
LYMPH%: 20.5 % (ref 14.0–49.0)
MCH: 32.9 pg (ref 27.2–33.4)
MCHC: 34.1 g/dL (ref 32.0–36.0)
MCV: 96.5 fL (ref 79.3–98.0)
NEUT%: 66.5 % (ref 39.0–75.0)
Platelets: 186 10*3/uL (ref 140–400)

## 2012-09-25 MED ORDER — DEXAMETHASONE SODIUM PHOSPHATE 4 MG/ML IJ SOLN
20.0000 mg | Freq: Once | INTRAMUSCULAR | Status: AC
  Administered 2012-09-25: 20 mg via INTRAVENOUS

## 2012-09-25 MED ORDER — ONDANSETRON HCL 8 MG PO TABS: 8.0000 mg | ORAL_TABLET | Freq: Once | ORAL | Status: DC

## 2012-09-25 MED ORDER — SODIUM CHLORIDE 0.9 % IV SOLN
400.0000 mg | Freq: Once | INTRAVENOUS | Status: AC
  Administered 2012-09-25: 400 mg via INTRAVENOUS
  Filled 2012-09-25: qty 20

## 2012-09-25 MED ORDER — ONDANSETRON 8 MG/50ML IVPB (CHCC)
8.0000 mg | Freq: Once | INTRAVENOUS | Status: AC
  Administered 2012-09-25: 8 mg via INTRAVENOUS

## 2012-09-25 MED ORDER — BORTEZOMIB CHEMO SQ INJECTION 3.5 MG (2.5MG/ML)
1.3000 mg/m2 | Freq: Once | INTRAMUSCULAR | Status: AC
  Administered 2012-09-25: 2.5 mg via SUBCUTANEOUS
  Filled 2012-09-25: qty 2.5

## 2012-09-25 NOTE — Progress Notes (Signed)
I followed up with patient today in the chemotherapy area. Patient was in good spirits. His weight continues to increase and was documented as 159.25 pounds on January 24. This is increased from 151.1 pounds September 3 and 145.3 pounds November 26. He continues to drink boost one to 2 times daily. He states his taste alterations have improved. He has no other nutrition concerns.  Nutrition diagnosis: Food and nutrition related knowledge deficit resolved.  I have encouraged and educated patient to continue strategies for increased oral intake to promote weight maintenance or weight gain. He is to continue boost plus as needed. I've encouraged him to contact me if he has any questions or concerns about nutrition or if he needs additional coupons. Patient expresses appreciation and agrees to contact me with further needs.

## 2012-09-25 NOTE — Patient Instructions (Signed)
[facility] Discharge Instructions for Patients Receiving Chemotherapy  Today you received the following chemotherapy agents Cytoxan/Velcade To help prevent nausea and vomiting after your treatment, we encourage you to take your nausea medication as prescribed.   If you develop nausea and vomiting that is not controlled by your nausea medication, call the clinic. If it is after clinic hours your family physician or the after hours number for the clinic or go to the Emergency Department.   BELOW ARE SYMPTOMS THAT SHOULD BE REPORTED IMMEDIATELY:  *FEVER GREATER THAN 100.5 F  *CHILLS WITH OR WITHOUT FEVER  NAUSEA AND VOMITING THAT IS NOT CONTROLLED WITH YOUR NAUSEA MEDICATION  *UNUSUAL SHORTNESS OF BREATH  *UNUSUAL BRUISING OR BLEEDING  TENDERNESS IN MOUTH AND THROAT WITH OR WITHOUT PRESENCE OF ULCERS  *URINARY PROBLEMS  *BOWEL PROBLEMS  UNUSUAL RASH Items with * indicate a potential emergency and should be followed up as soon as possible.  One of the nurses will contact you 24 hours after your treatment. Please let the nurse know about any problems that you may have experienced. Feel free to call the clinic you have any questions or concerns. The clinic phone number is [phone].   I have been informed and understand all the instructions given to me. I know to contact the clinic, my physician, or go to the Emergency Department if any problems should occur. I do not have any questions at this time, but understand that I may call the clinic during office hours   should I have any questions or need assistance in obtaining follow up care.    __________________________________________  _____________  __________ Signature of Patient or Authorized Representative            Date                   Time    __________________________________________ Nurse's Signature    

## 2012-09-26 ENCOUNTER — Ambulatory Visit (HOSPITAL_BASED_OUTPATIENT_CLINIC_OR_DEPARTMENT_OTHER): Payer: Medicare Other

## 2012-09-26 VITALS — BP 179/78 | HR 78 | Temp 97.6°F

## 2012-09-26 DIAGNOSIS — C9000 Multiple myeloma not having achieved remission: Secondary | ICD-10-CM

## 2012-09-26 MED ORDER — PEGFILGRASTIM INJECTION 6 MG/0.6ML
6.0000 mg | Freq: Once | SUBCUTANEOUS | Status: AC
  Administered 2012-09-26: 6 mg via SUBCUTANEOUS
  Filled 2012-09-26: qty 0.6

## 2012-09-26 NOTE — Progress Notes (Signed)
Trevor Michael here for Neulasta injection.  BP 204/105-84 left arm.  States that his BP has been high but not usually that high.  Rechecked after approx 5 minutes.  BP 179/78-78  Closer to his normal.  Instructed him to see his primary care about this.  He agreed to recheck at home and if it continues to be elevated, he would see Trevor Michael about it.

## 2012-10-09 ENCOUNTER — Ambulatory Visit (HOSPITAL_BASED_OUTPATIENT_CLINIC_OR_DEPARTMENT_OTHER): Payer: Medicare Other

## 2012-10-09 ENCOUNTER — Encounter: Payer: Self-pay | Admitting: Oncology

## 2012-10-09 ENCOUNTER — Ambulatory Visit (HOSPITAL_BASED_OUTPATIENT_CLINIC_OR_DEPARTMENT_OTHER): Payer: Medicare Other | Admitting: Oncology

## 2012-10-09 ENCOUNTER — Other Ambulatory Visit: Payer: BLUE CROSS/BLUE SHIELD | Admitting: Lab

## 2012-10-09 ENCOUNTER — Other Ambulatory Visit: Payer: Medicare Other | Admitting: Lab

## 2012-10-09 VITALS — BP 153/72 | HR 69 | Temp 98.0°F | Resp 20 | Ht 69.0 in | Wt 160.9 lb

## 2012-10-09 DIAGNOSIS — C9000 Multiple myeloma not having achieved remission: Secondary | ICD-10-CM

## 2012-10-09 LAB — CBC WITH DIFFERENTIAL/PLATELET
BASO%: 0.2 % (ref 0.0–2.0)
HCT: 40.9 % (ref 38.4–49.9)
MCHC: 34 g/dL (ref 32.0–36.0)
MONO#: 0.6 10*3/uL (ref 0.1–0.9)
NEUT%: 70.8 % (ref 39.0–75.0)
RBC: 4.23 10*6/uL (ref 4.20–5.82)
RDW: 14.1 % (ref 11.0–14.6)
WBC: 5.7 10*3/uL (ref 4.0–10.3)
lymph#: 1.1 10*3/uL (ref 0.9–3.3)
nRBC: 0 % (ref 0–0)

## 2012-10-09 LAB — COMPREHENSIVE METABOLIC PANEL (CC13)
ALT: 20 U/L (ref 0–55)
AST: 23 U/L (ref 5–34)
Albumin: 3.5 g/dL (ref 3.5–5.0)
Alkaline Phosphatase: 158 U/L — ABNORMAL HIGH (ref 40–150)
Calcium: 9.3 mg/dL (ref 8.4–10.4)
Chloride: 101 mEq/L (ref 98–107)
Potassium: 3.5 mEq/L (ref 3.5–5.1)
Sodium: 139 mEq/L (ref 136–145)
Total Protein: 8 g/dL (ref 6.4–8.3)

## 2012-10-09 MED ORDER — SODIUM CHLORIDE 0.9 % IV SOLN
400.0000 mg | Freq: Once | INTRAVENOUS | Status: AC
  Administered 2012-10-09: 400 mg via INTRAVENOUS
  Filled 2012-10-09: qty 20

## 2012-10-09 MED ORDER — ONDANSETRON 8 MG/50ML IVPB (CHCC)
8.0000 mg | Freq: Once | INTRAVENOUS | Status: AC
  Administered 2012-10-09: 8 mg via INTRAVENOUS

## 2012-10-09 MED ORDER — SODIUM CHLORIDE 0.9 % IV SOLN
Freq: Once | INTRAVENOUS | Status: AC
  Administered 2012-10-09: 11:00:00 via INTRAVENOUS

## 2012-10-09 MED ORDER — BORTEZOMIB CHEMO SQ INJECTION 3.5 MG (2.5MG/ML)
1.3000 mg/m2 | Freq: Once | INTRAMUSCULAR | Status: AC
  Administered 2012-10-09: 2.5 mg via SUBCUTANEOUS
  Filled 2012-10-09: qty 2.5

## 2012-10-09 MED ORDER — ZOLEDRONIC ACID 4 MG/100ML IV SOLN
4.0000 mg | Freq: Once | INTRAVENOUS | Status: AC
  Administered 2012-10-09: 4 mg via INTRAVENOUS
  Filled 2012-10-09: qty 100

## 2012-10-09 MED ORDER — DEXAMETHASONE SODIUM PHOSPHATE 4 MG/ML IJ SOLN
20.0000 mg | Freq: Once | INTRAMUSCULAR | Status: AC
  Administered 2012-10-09: 20 mg via INTRAVENOUS

## 2012-10-09 NOTE — Patient Instructions (Addendum)
[facility] Discharge Instructions for Patients Receiving Chemotherapy  Today you received the following chemotherapy agents; Velcade, Cytoxan, Zometa. To help prevent nausea and vomiting after your treatment, we encourage you to take your nausea medication.  If you develop nausea and vomiting that is not controlled by your nausea medication, call the clinic.   BELOW ARE SYMPTOMS THAT SHOULD BE REPORTED IMMEDIATELY:  *FEVER GREATER THAN 100.5 F  *CHILLS WITH OR WITHOUT FEVER  NAUSEA AND VOMITING THAT IS NOT CONTROLLED WITH YOUR NAUSEA MEDICATION  *UNUSUAL SHORTNESS OF BREATH  *UNUSUAL BRUISING OR BLEEDING  TENDERNESS IN MOUTH AND THROAT WITH OR WITHOUT PRESENCE OF ULCERS  *URINARY PROBLEMS  *BOWEL PROBLEMS  UNUSUAL RASH Items with * indicate a potential emergency and should be followed up as soon as possible.  Feel free to call the clinic you have any questions or concerns. The clinic phone number is [phone].
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
## 2012-10-09 NOTE — Progress Notes (Signed)
This office note has been dictated.  #[id number]

## 2012-10-10 ENCOUNTER — Telehealth: Payer: Self-pay | Admitting: *Deleted

## 2012-10-10 ENCOUNTER — Ambulatory Visit (HOSPITAL_BASED_OUTPATIENT_CLINIC_OR_DEPARTMENT_OTHER): Payer: Medicare Other

## 2012-10-10 ENCOUNTER — Telehealth: Payer: Self-pay | Admitting: Oncology

## 2012-10-10 ENCOUNTER — Other Ambulatory Visit: Payer: Self-pay | Admitting: Certified Registered Nurse Anesthetist

## 2012-10-10 VITALS — BP 148/66 | HR 79 | Temp 98.4°F

## 2012-10-10 LAB — KAPPA/LAMBDA LIGHT CHAINS: Lambda Free Lght Chn: 47.5 mg/dL — ABNORMAL HIGH (ref 0.57–2.63)

## 2012-10-10 LAB — IGA: IgA: 2840 mg/dL — ABNORMAL HIGH (ref 68–379)

## 2012-10-10 MED ORDER — PEGFILGRASTIM INJECTION 6 MG/0.6ML
6.0000 mg | Freq: Once | SUBCUTANEOUS | Status: AC
  Administered 2012-10-10: 6 mg via SUBCUTANEOUS
  Filled 2012-10-10: qty 0.6

## 2012-10-10 NOTE — Telephone Encounter (Signed)
lvm for pt regarding to change in time of 3.20.14 appt.Marland KitchenMarland KitchenMarland Kitchen

## 2012-10-10 NOTE — Telephone Encounter (Signed)
Per staff message and POF I have scheduled appts.  JMW  

## 2012-10-12 ENCOUNTER — Other Ambulatory Visit: Payer: Self-pay | Admitting: Oncology

## 2012-10-21 ENCOUNTER — Encounter: Payer: Self-pay | Admitting: *Deleted

## 2012-10-23 ENCOUNTER — Other Ambulatory Visit: Payer: Medicare Other | Admitting: Lab

## 2012-10-23 ENCOUNTER — Other Ambulatory Visit: Payer: Self-pay | Admitting: Oncology

## 2012-10-23 ENCOUNTER — Ambulatory Visit (HOSPITAL_BASED_OUTPATIENT_CLINIC_OR_DEPARTMENT_OTHER): Payer: Medicare Other

## 2012-10-23 DIAGNOSIS — C9000 Multiple myeloma not having achieved remission: Secondary | ICD-10-CM

## 2012-10-23 LAB — CBC WITH DIFFERENTIAL/PLATELET
Basophils Absolute: 0 10*3/uL (ref 0.0–0.1)
EOS%: 0.7 % (ref 0.0–7.0)
HCT: 40 % (ref 38.4–49.9)
HGB: 13.8 g/dL (ref 13.0–17.1)
MCH: 32.9 pg (ref 27.2–33.4)
MONO#: 0.6 10*3/uL (ref 0.1–0.9)
NEUT%: 66.3 % (ref 39.0–75.0)
Platelets: 174 10*3/uL (ref 140–400)
lymph#: 1.2 10*3/uL (ref 0.9–3.3)

## 2012-10-23 MED ORDER — SODIUM CHLORIDE 0.9 % IV SOLN
400.0000 mg | Freq: Once | INTRAVENOUS | Status: AC
  Administered 2012-10-23: 400 mg via INTRAVENOUS
  Filled 2012-10-23: qty 20

## 2012-10-23 MED ORDER — DEXAMETHASONE SODIUM PHOSPHATE 4 MG/ML IJ SOLN
20.0000 mg | Freq: Once | INTRAMUSCULAR | Status: AC
  Administered 2012-10-23: 20 mg via INTRAVENOUS

## 2012-10-23 MED ORDER — ONDANSETRON 8 MG/50ML IVPB (CHCC)
8.0000 mg | Freq: Once | INTRAVENOUS | Status: AC
  Administered 2012-10-23: 8 mg via INTRAVENOUS

## 2012-10-23 MED ORDER — BORTEZOMIB CHEMO SQ INJECTION 3.5 MG (2.5MG/ML)
1.3000 mg/m2 | Freq: Once | INTRAMUSCULAR | Status: AC
  Administered 2012-10-23: 2.5 mg via SUBCUTANEOUS
  Filled 2012-10-23: qty 2.5

## 2012-10-23 NOTE — Patient Instructions (Addendum)
[facility] Discharge Instructions for Patients Receiving Chemotherapy  Today you received the following chemotherapy agents Cytoxan and Velcade.  To help prevent nausea and vomiting after your treatment, we encourage you to take your nausea medication as directed.   If you develop nausea and vomiting that is not controlled by your nausea medication, call the clinic. If it is after clinic hours your family physician or the after hours number for the clinic or go to the Emergency Department.   BELOW ARE SYMPTOMS THAT SHOULD BE REPORTED IMMEDIATELY:  *FEVER GREATER THAN 100.5 F  *CHILLS WITH OR WITHOUT FEVER  NAUSEA AND VOMITING THAT IS NOT CONTROLLED WITH YOUR NAUSEA MEDICATION  *UNUSUAL SHORTNESS OF BREATH  *UNUSUAL BRUISING OR BLEEDING  TENDERNESS IN MOUTH AND THROAT WITH OR WITHOUT PRESENCE OF ULCERS  *URINARY PROBLEMS  *BOWEL PROBLEMS  UNUSUAL RASH Items with * indicate a potential emergency and should be followed up as soon as possible.  Feel free to call the clinic you have any questions or concerns. The clinic phone number is [phone].

## 2012-10-24 ENCOUNTER — Ambulatory Visit (HOSPITAL_BASED_OUTPATIENT_CLINIC_OR_DEPARTMENT_OTHER): Payer: BLUE CROSS/BLUE SHIELD

## 2012-10-24 VITALS — BP 168/78 | HR 77 | Temp 98.7°F

## 2012-10-24 DIAGNOSIS — C9000 Multiple myeloma not having achieved remission: Secondary | ICD-10-CM

## 2012-10-24 MED ORDER — PEGFILGRASTIM INJECTION 6 MG/0.6ML
6.0000 mg | Freq: Once | SUBCUTANEOUS | Status: AC
  Administered 2012-10-24: 6 mg via SUBCUTANEOUS
  Filled 2012-10-24: qty 0.6

## 2012-10-31 ENCOUNTER — Other Ambulatory Visit: Payer: Self-pay | Admitting: Medical Oncology

## 2012-10-31 ENCOUNTER — Telehealth: Payer: Self-pay | Admitting: Medical Oncology

## 2012-10-31 NOTE — Telephone Encounter (Signed)
I called pt to inform him that Dr. Lillia Abed office called and asked if Dr. Arline Asp can do a bone marrow biopsy on pt next week. This is set up for March 20 th at 7/8 am at [facility] short stay. Pt notified and instructions discussed. Appointments were arranged with Geraldine Contras and Montez Morita. Orders in computer. I explained to the pt that we will cancel his appointments here in the office on the 20 th. He voiced understanding.

## 2012-10-31 NOTE — Telephone Encounter (Signed)
I received a call from [facility] to let us know that Trevor Michael will not be able to start his transplant until early May. So, we need to move his bone marrow biopsy out to April. I rescheduled the bone marrow to April 17. I spoke with Geraldine Contras and [place] with date change. I called Trevor Michael to let him know that he needs to keep his appointment Thurs 11/06/12 for lab,MD and chemo. He voiced understanding.

## 2012-11-02 ENCOUNTER — Encounter: Payer: Self-pay | Admitting: Oncology

## 2012-11-02 NOTE — Progress Notes (Signed)
This patient was seen at [facility] on 10/29/2012. The plan at this point is to proceed with appropriate workup and planning for high-dose chemotherapy and autologous stem cell transplant.  We will be administering another course of treatment and planning to do a bone marrow in approximately 4 weeks.

## 2012-11-03 ENCOUNTER — Other Ambulatory Visit: Payer: Self-pay | Admitting: Medical Oncology

## 2012-11-06 ENCOUNTER — Encounter: Payer: Self-pay | Admitting: Oncology

## 2012-11-06 ENCOUNTER — Other Ambulatory Visit (HOSPITAL_BASED_OUTPATIENT_CLINIC_OR_DEPARTMENT_OTHER): Payer: Medicare Other | Admitting: Lab

## 2012-11-06 ENCOUNTER — Other Ambulatory Visit (HOSPITAL_COMMUNITY): Payer: Medicare Other

## 2012-11-06 ENCOUNTER — Ambulatory Visit (HOSPITAL_BASED_OUTPATIENT_CLINIC_OR_DEPARTMENT_OTHER): Payer: Medicare Other | Admitting: Oncology

## 2012-11-06 ENCOUNTER — Ambulatory Visit (HOSPITAL_BASED_OUTPATIENT_CLINIC_OR_DEPARTMENT_OTHER): Payer: Medicare Other

## 2012-11-06 ENCOUNTER — Other Ambulatory Visit: Payer: Medicare Other | Admitting: Lab

## 2012-11-06 VITALS — BP 172/79 | HR 60 | Temp 97.1°F | Resp 18 | Ht 69.0 in | Wt 159.4 lb

## 2012-11-06 DIAGNOSIS — C9000 Multiple myeloma not having achieved remission: Secondary | ICD-10-CM

## 2012-11-06 LAB — CBC WITH DIFFERENTIAL/PLATELET
Basophils Absolute: 0 10*3/uL (ref 0.0–0.1)
EOS%: 0.5 % (ref 0.0–7.0)
HGB: 13.8 g/dL (ref 13.0–17.1)
LYMPH%: 20.8 % (ref 14.0–49.0)
MCH: 32.4 pg (ref 27.2–33.4)
MCV: 95.8 fL (ref 79.3–98.0)
MONO%: 12.9 % (ref 0.0–14.0)
RDW: 13.9 % (ref 11.0–14.6)

## 2012-11-06 LAB — COMPREHENSIVE METABOLIC PANEL (CC13)
ALT: 15 U/L (ref 0–55)
Albumin: 3.5 g/dL (ref 3.5–5.0)
CO2: 28 mEq/L (ref 22–29)
Calcium: 9.3 mg/dL (ref 8.4–10.4)
Chloride: 102 mEq/L (ref 98–107)
Glucose: 79 mg/dl (ref 70–99)
Sodium: 141 mEq/L (ref 136–145)
Total Bilirubin: 0.28 mg/dL (ref 0.20–1.20)
Total Protein: 8.3 g/dL (ref 6.4–8.3)

## 2012-11-06 MED ORDER — ONDANSETRON 8 MG/50ML IVPB (CHCC)
8.0000 mg | Freq: Once | INTRAVENOUS | Status: AC
  Administered 2012-11-06: 8 mg via INTRAVENOUS

## 2012-11-06 MED ORDER — BORTEZOMIB CHEMO SQ INJECTION 3.5 MG (2.5MG/ML)
1.6000 mg/m2 | Freq: Once | INTRAMUSCULAR | Status: AC
  Administered 2012-11-06: 3 mg via SUBCUTANEOUS
  Filled 2012-11-06: qty 3

## 2012-11-06 MED ORDER — DEXAMETHASONE SODIUM PHOSPHATE 4 MG/ML IJ SOLN
40.0000 mg | Freq: Once | INTRAMUSCULAR | Status: AC
  Administered 2012-11-06: 40 mg via INTRAVENOUS

## 2012-11-06 MED ORDER — SODIUM CHLORIDE 0.9 % IV SOLN
600.0000 mg | Freq: Once | INTRAVENOUS | Status: AC
  Administered 2012-11-06: 600 mg via INTRAVENOUS
  Filled 2012-11-06: qty 30

## 2012-11-06 NOTE — Progress Notes (Signed)
This office note has been dictated.  #[id number]

## 2012-11-06 NOTE — Patient Instructions (Addendum)
[facility] Discharge Instructions for Patients Receiving Chemotherapy  Today you received the following chemotherapy agents Cytoxan and Velcade injection.  To help prevent nausea and vomiting after your treatment, we encourage you to take your nausea medication. Begin taking your nausea medication as often as prescribed by Dr. Arline Asp.    If you develop nausea and vomiting that is not controlled by your nausea medication, call the clinic. If it is after clinic hours your family physician or the after hours number for the clinic or go to the Emergency Department.   BELOW ARE SYMPTOMS THAT SHOULD BE REPORTED IMMEDIATELY:  *FEVER GREATER THAN 100.5 F  *CHILLS WITH OR WITHOUT FEVER  NAUSEA AND VOMITING THAT IS NOT CONTROLLED WITH YOUR NAUSEA MEDICATION  *UNUSUAL SHORTNESS OF BREATH  *UNUSUAL BRUISING OR BLEEDING  TENDERNESS IN MOUTH AND THROAT WITH OR WITHOUT PRESENCE OF ULCERS  *URINARY PROBLEMS  *BOWEL PROBLEMS  UNUSUAL RASH Items with * indicate a potential emergency and should be followed up as soon as possible.  One of the nurses will contact you 24 hours after your treatment. Please let the nurse know about any problems that you may have experienced. Feel free to call the clinic you have any questions or concerns. The clinic phone number is [phone].   I have been informed and understand all the instructions given to me. I know to contact the clinic, my physician, or go to the Emergency Department if any problems should occur. I do not have any questions at this time, but understand that I may call the clinic during office hours   should I have any questions or need assistance in obtaining follow up care.    __________________________________________  _____________  __________ Signature of Patient or Authorized Representative            Date                   Time    __________________________________________ Nurse's Signature

## 2012-11-07 ENCOUNTER — Ambulatory Visit (HOSPITAL_BASED_OUTPATIENT_CLINIC_OR_DEPARTMENT_OTHER): Payer: Medicare Other

## 2012-11-07 VITALS — BP 168/73 | HR 86 | Temp 98.2°F

## 2012-11-07 DIAGNOSIS — Z5189 Encounter for other specified aftercare: Secondary | ICD-10-CM

## 2012-11-07 DIAGNOSIS — C9000 Multiple myeloma not having achieved remission: Secondary | ICD-10-CM

## 2012-11-07 LAB — KAPPA/LAMBDA LIGHT CHAINS: Kappa free light chain: 0.03 mg/dL — ABNORMAL LOW (ref 0.33–1.94)

## 2012-11-07 MED ORDER — PEGFILGRASTIM INJECTION 6 MG/0.6ML
6.0000 mg | Freq: Once | SUBCUTANEOUS | Status: AC
  Administered 2012-11-07: 6 mg via SUBCUTANEOUS
  Filled 2012-11-07: qty 0.6

## 2012-11-07 NOTE — Progress Notes (Signed)
CC:   Trevor Gess. Norins, MD Trevor Michael, M.D. Trevor Michael, Ph.D., M.D. Trevor Michael, M.D. Trevor Michael Trevor Howard, MD Trevor Fuller, MD Trevor Candle, MD   PROBLEM LIST:  1. IgA lambda multiple myeloma diagnosed in late October 2013. The patient  has anemia, thrombocytopenia, multiple bone lesions, renal  insufficiency, and monoclonal proteins in the serum and urine. Bone  marrow carried out on 06/24/2012 yielded 48% plasma cells by morphology.  Cytogenetic analysis revealed the presence of normal male chromosomes  with no observable clonal chromosomal abnormalities. There was a single  cell with additional chromosome material on 3q, loss of chromosome 13  and a marker chromosome. DNA probe panel disclosed a gain of chromosome  11 which is consistent with abnormalities associated with multiple  myeloma. A 24-hour urine collection on 06/27/2012 yielded 17.1 g of  protein of which 16.9 g was free lambda light chains. Beta 2  microglobulin was 5.8. Albumin 3.2. IgG level on 06/19/2012 was 6320.  Myeloma is ISS stage III.  Chemotherapy with Velcade, Cytoxan, and Decadron was started on  07/01/2012. Radiation treatments were administered to the right humerus 25 Gy in 10 fractions from 07/10/2012 through 07/28/2012. Neulasta was started on 08/16/2012 because of leukopenia and neutropenia. Zometa was started on 09/11/2012 after the patient had received dental clearance. Doses of the current chemotherapy program utilizing subcutaneous Velcade, IV Cytoxan, and IV Decadron were increased on 11/06/2012.  2. Renal insufficiency. Creatinine clearance on 06/27/2012 was 68  mL/min.  3. A 2.3 cm lytic lesion in the right humerus at risk for pathologic  Fracture. Radiation treatments were administered to the right humerus 25 Gy in 10 fractions from 07/10/2012 through 07/28/2012.  4. Hypertension.  5. Benign prostate hypertrophy and elevated prostate specific antigen,  under the care of  Trevor Michael. The patient had a prostate  biopsy in 2008 that was benign.  6. Dyslipidemia.  7. Arthritis involving the cervical spine  8. Glaucoma.   MEDICATIONS:  Reviewed and recorded. Current Outpatient Prescriptions  Medication Sig Dispense Refill  . acetaminophen (TYLENOL) 500 MG tablet Take 1,000 mg by mouth every 6 (six) hours as needed. For pain      . acyclovir (ZOVIRAX) 400 MG tablet Take 1 tablet (400 mg total) by mouth 2 (two) times daily.  60 tablet  6  . allopurinol (ZYLOPRIM) 100 MG tablet Take 1 tablet (100 mg total) by mouth daily.  30 tablet  11  . amLODipine (NORVASC) 5 MG tablet Take 5 mg by mouth at bedtime.      Marland Kitchen atenolol-chlorthalidone (TENORETIC) 50-25 MG per tablet TAKE ONE TABLET ONCE DAILY  30 tablet  5  . b complex vitamins tablet Take 1 tablet by mouth daily.        . bimatoprost (LUMIGAN) 0.03 % ophthalmic drops Place 1 drop into both eyes at bedtime.        . brimonidine-timolol (COMBIGAN) 0.2-0.5 % ophthalmic solution Place 1 drop into both eyes 2 (two) times daily.        . clindamycin (CLEOCIN T) 1 % lotion       . esomeprazole (NEXIUM) 40 MG capsule Take 1 capsule (40 mg total) by mouth daily before breakfast.  30 capsule  11  . ondansetron (ZOFRAN) 8 MG tablet Take 1 tablet (8 mg total) by mouth every 8 (eight) hours as needed.  20 tablet  3  . potassium chloride SA (K-DUR,KLOR-CON) 20 MEQ tablet Take 1 tablet (20 mEq  total) by mouth daily.  30 tablet  3  . prochlorperazine (COMPAZINE) 10 MG tablet Take 1 tablet (10 mg total) by mouth every 6 (six) hours as needed.  30 tablet  3  . RAPAFLO 8 MG CAPS capsule 8 mg daily with breakfast.       . simvastatin (ZOCOR) 20 MG tablet TAKE 1 TABLET (20 MG TOTAL) BY MOUTH AT BEDTIME.  30 tablet  5  . sodium bicarbonate 650 MG tablet Take 2 tablets (1,300 mg total) by mouth 2 (two) times daily.  120 tablet  5   No current facility-administered medications for this visit.    TREATMENT PROGRAM:   -Velcade 3.0  mg subcutaneous every 2 weeks. -Cytoxan 600 mg IV every 2 weeks. -Decadron 40 mg IV every 2 weeks. Doses of the chemotherapy were increased on 11/06/2012.  -Neulasta 6 mg subcu on day 2, repeated every 2 weeks.  Neulasta was started on 08/16/2012.  -Zometa 4 mg IV was started on 09/11/2012 and will be administered every  4 weeks.    IMMUNIZATIONS:  1. Pneumovax was administered on 07/07/2012.  2. Flu shot was given in November 2013.    SMOKING HISTORY: The patient has never smoked cigarettes.    HISTORY:  I saw Trevor Michael today for followup of his IgA lambda multiple myeloma with heavy proteinuria, stage III, diagnosed in late October 2013.  Trevor Michael was last seen by [place] on 10/09/2012.  He is accompanied by his wife, Trevor Michael.  He has been receiving chemotherapy every 2 weeks in conjunction with Neulasta.  He also receives Zometa on a monthly basis.  The patient has been tolerating his chemotherapy treatments quite well.  He is without any complaints today.  He feels well.  He denies any pain.  He was seen at [facility] by Dr. Eddie Michael within the past few weeks.  She would like [place] to obtain a bone marrow for re-evaluation.  This has been scheduled for December 04, 2012.  The patient also is being followed by Trevor Michael regarding his eye problems and also has seen Trevor Michael regarding the lytic lesion in his right humerus which had undergone radiation treatments. The patient is here today for another dose of chemotherapy, also Zometa. He will return tomorrow for Neulasta 6 mg subcu.  PHYSICAL EXAMINATION:  General:  Trevor Michael looks well.  Weight is stable at 159 pounds 6.4 ounces, height 5 feet 9 inches, body surface area 1.88 sq m.  Vital Signs:  Blood pressure today 172/79.  The patient was informed about his elevated blood pressure, which he says usually comes down to normal when he is home.  Other vital signs are normal. HEENT:  There is no scleral  icterus.  There is marked conjunctival injection bilaterally.  Mouth and pharynx are benign.  There is no peripheral adenopathy palpable.  Heart and lungs:  Normal.  Abdomen: Benign with no organomegaly or masses palpable.  There is a patch of what could be a tinea versicolor in the right mid abdomen.  Extremities: No peripheral edema or clubbing.  Neurologic:  Exam is normal. Musculoskeletal:  No musculoskeletal pain.  Right arm is benign.  The patient does not have a Port-A-Cath or central catheter.  LABORATORY DATA:  Today, white count 5.7, ANC 3.8, hemoglobin 13.8, hematocrit 40.8, platelets 159,000.  Chemistries, IgA level, and serum light chains are pending.  Chemistries from 10/09/2012 were normal, except for an alkaline phosphatase of 158.  Total protein was  8.0 and albumin 3.5.  On 10/23/2012 the IgA level was 2880 and on 10/09/2012 IgA level was 2840.  On 09/11/2012 IgA level was 2280.  The serum lambda light chain on 10/09/2012 was 47.50.   IMAGING STUDIES:  1. 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 There is foraminal  encroachment at this level, left greater than right. There were  degenerative changes at other levels which were described in detail  in the body of the report.  2. Chest x-ray, 2 view, from 11/16/2009 showed no acute findings.  3. CT abdomen and pelvis without IV contrast on 06/18/2012 showed  multiple lytic bone lesions highly suspicious for metastatic  disease or multiple myeloma. The largest lesion seen with some  cortical destruction in the anterior aspect of the iliac bone  measured 3 cm. There was no evidence of colitis or diverticulitis.  There was no evidence for hydronephrosis or hydroureter. There  were bilateral probable renal cysts. There was an enlarged  prostate gland with indentation of the urinary bladder  base. There  was a small right hydrocele.  4. Metastatic bone survey from 06/19/2012 showed lytic lesions in the  calvaria and right hemipelvis and a possible lesion in the right  humerus. This latter lesion was not specifically mentioned in the  report.  5. Nuclear bone scan on 06/19/2012 showed some increased uptake  involving the femur and humerus bilaterally, possibly reflecting  changes of metabolic bone disease. There was a lack of correlation  with the bone scan and the findings seen on CT and x-rays, raising  the possibility of multiple myeloma.  6. Renal ultrasound on 06/19/2012 showed increased echogenicity of the  renal parenchyma bilaterally consistent with renal medical disease.  There was no evidence for obstruction. There was a slightly  enlarged prostate gland.  7. CT scan of the right humerus without IV contrast on 06/20/2012  showed 2.3-cm lucent lesion with probable mild endosteal thinning  anteriorly near the bicipital groove. There were several small  lytic lesions present within the humeral head, scapula, glenoid,  and proximal radius. No pathologic fracture or soft tissue mass  was seen. There were a few small nodules within the visualized  right lung, the largest of which was 4 mm in the upper lobe on  image 78.    PROCEDURES:  1. CT-guided bone marrow aspirate and biopsy was carried out on  06/24/2012.   IMPRESSION AND PLAN:  Clinically, Trevor Michael seems to be doing well.  I am concerned that his IgA level and serum lambda light chains may be increased from their lowest point on 09/11/2012.  It will be recalled that the patient is being treated with chemotherapy every 2 weeks with Neulasta support.  He is receiving Zometa every 4 weeks.  We had to treat every 2 weeks because the patient did have an episode of neutropenia where his absolute neutrophil count was 0.4 on 07/29/2012. His platelets also were low with treatment that was being given initially  twice a week, and then even with weekly administration.  Because of my concerns about the possibility that our effectiveness from the current chemotherapy has plateaued, or that we may even be losing some control of the patient's myeloma, I am going to increase doses as follows:  Velcade will be 3.0 mg subcutaneous, Cytoxan 600 mg IV, and Decadron 40 mg IV.  Patient will receive this today.  Also, Zometa 4 mg IV.  In 2 weeks on April 3rd, will check CBC and an IgA level, and the patient will be due for another course of chemotherapy.  He is scheduled for a bone marrow on the morning of April 17th.  I have also scheduled him for an appointment to see me on April 17th, at which time we will check CBC, chemistries, IgA level, serum light chains, and a serum protein electrophoresis.  I believe the patient will be collecting a 24- hour urine for Dr. Barbaraann Boys.  If he has not had a satisfactory response, then we will probably need to make some changes in his chemotherapy program prior to going ahead with high-dose treatment and autologous stem cell rescue.    ______________________________ Samul Dada, M.D. DSM/MEDQ  D:  11/06/2012  T:  11/07/2012  Job:  [id number]

## 2012-11-12 ENCOUNTER — Telehealth: Payer: Self-pay | Admitting: *Deleted

## 2012-11-12 NOTE — Telephone Encounter (Signed)
Lm gv appt d/t..made pt aware that i would mail a letter/cal as well.

## 2012-11-19 ENCOUNTER — Telehealth: Payer: Self-pay | Admitting: Medical Oncology

## 2012-11-19 NOTE — Telephone Encounter (Signed)
[facility] called asking if we had the results of bone marrow biopsy. I called her back and left a message that she had called me back and said they would not be able to transplant until the end of April. Pt is scheduled for 12/04/12. I asked her to call me if we need to to this sooner.

## 2012-11-20 ENCOUNTER — Ambulatory Visit (HOSPITAL_BASED_OUTPATIENT_CLINIC_OR_DEPARTMENT_OTHER): Payer: Medicare Other

## 2012-11-20 ENCOUNTER — Other Ambulatory Visit (HOSPITAL_BASED_OUTPATIENT_CLINIC_OR_DEPARTMENT_OTHER): Payer: Medicare Other | Admitting: Lab

## 2012-11-20 VITALS — BP 184/82 | HR 60 | Temp 97.4°F | Resp 17

## 2012-11-20 DIAGNOSIS — C9000 Multiple myeloma not having achieved remission: Secondary | ICD-10-CM

## 2012-11-20 DIAGNOSIS — Z5112 Encounter for antineoplastic immunotherapy: Secondary | ICD-10-CM

## 2012-11-20 LAB — CBC WITH DIFFERENTIAL/PLATELET
BASO%: 0.2 % (ref 0.0–2.0)
Eosinophils Absolute: 0 10*3/uL (ref 0.0–0.5)
LYMPH%: 19.2 % (ref 14.0–49.0)
MCHC: 34.3 g/dL (ref 32.0–36.0)
MONO#: 0.7 10*3/uL (ref 0.1–0.9)
MONO%: 12.7 % (ref 0.0–14.0)
NEUT#: 3.8 10*3/uL (ref 1.5–6.5)
Platelets: 172 10*3/uL (ref 140–400)
RBC: 4.23 10*6/uL (ref 4.20–5.82)
RDW: 14.3 % (ref 11.0–14.6)
WBC: 5.7 10*3/uL (ref 4.0–10.3)
nRBC: 0 % (ref 0–0)

## 2012-11-20 MED ORDER — SODIUM CHLORIDE 0.9 % IV SOLN
600.0000 mg | Freq: Once | INTRAVENOUS | Status: AC
  Administered 2012-11-20: 600 mg via INTRAVENOUS
  Filled 2012-11-20: qty 30

## 2012-11-20 MED ORDER — SODIUM CHLORIDE 0.9 % IV SOLN
INTRAVENOUS | Status: DC
  Administered 2012-11-20: 11:00:00 via INTRAVENOUS

## 2012-11-20 MED ORDER — ONDANSETRON 8 MG/50ML IVPB (CHCC)
8.0000 mg | Freq: Once | INTRAVENOUS | Status: AC
  Administered 2012-11-20: 8 mg via INTRAVENOUS

## 2012-11-20 MED ORDER — BORTEZOMIB CHEMO SQ INJECTION 3.5 MG (2.5MG/ML)
1.6000 mg/m2 | Freq: Once | INTRAMUSCULAR | Status: AC
  Administered 2012-11-20: 3 mg via SUBCUTANEOUS
  Filled 2012-11-20: qty 3

## 2012-11-20 MED ORDER — DEXAMETHASONE SODIUM PHOSPHATE 4 MG/ML IJ SOLN
40.0000 mg | Freq: Once | INTRAMUSCULAR | Status: AC
  Administered 2012-11-20: 40 mg via INTRAVENOUS

## 2012-11-20 NOTE — Patient Instructions (Addendum)
[facility] Discharge Instructions for Patients Receiving Chemotherapy  Today you received the following chemotherapy agents Velcade and Cytoxan.  To help prevent nausea and vomiting after your treatment, we encourage you to take your nausea medication as prescribed.    If you develop nausea and vomiting that is not controlled by your nausea medication, call the clinic. If it is after clinic hours your family physician or the after hours number for the clinic or go to the Emergency Department.   BELOW ARE SYMPTOMS THAT SHOULD BE REPORTED IMMEDIATELY:  *FEVER GREATER THAN 100.5 F  *CHILLS WITH OR WITHOUT FEVER  NAUSEA AND VOMITING THAT IS NOT CONTROLLED WITH YOUR NAUSEA MEDICATION  *UNUSUAL SHORTNESS OF BREATH  *UNUSUAL BRUISING OR BLEEDING  TENDERNESS IN MOUTH AND THROAT WITH OR WITHOUT PRESENCE OF ULCERS  *URINARY PROBLEMS  *BOWEL PROBLEMS  UNUSUAL RASH Items with * indicate a potential emergency and should be followed up as soon as possible.   Please let the nurse know about any problems that you may have experienced. Feel free to call the clinic you have any questions or concerns. The clinic phone number is [phone].   I have been informed and understand all the instructions given to me. I know to contact the clinic, my physician, or go to the Emergency Department if any problems should occur. I do not have any questions at this time, but understand that I may call the clinic during office hours   should I have any questions or need assistance in obtaining follow up care.    __________________________________________  _____________  __________ Signature of Patient or Authorized Representative            Date                   Time    __________________________________________ Nurse's Signature    

## 2012-11-21 ENCOUNTER — Ambulatory Visit (HOSPITAL_BASED_OUTPATIENT_CLINIC_OR_DEPARTMENT_OTHER): Payer: Medicare Other

## 2012-11-21 VITALS — BP 158/68 | HR 74 | Temp 98.1°F

## 2012-11-21 DIAGNOSIS — C9000 Multiple myeloma not having achieved remission: Secondary | ICD-10-CM

## 2012-11-21 DIAGNOSIS — Z5189 Encounter for other specified aftercare: Secondary | ICD-10-CM

## 2012-11-21 LAB — IGA: IgA: 2700 mg/dL — ABNORMAL HIGH (ref 68–379)

## 2012-11-21 MED ORDER — PEGFILGRASTIM INJECTION 6 MG/0.6ML
6.0000 mg | Freq: Once | SUBCUTANEOUS | Status: AC
  Administered 2012-11-21: 6 mg via SUBCUTANEOUS
  Filled 2012-11-21: qty 0.6

## 2012-11-27 ENCOUNTER — Encounter (HOSPITAL_COMMUNITY): Payer: Self-pay | Admitting: Pharmacy Technician

## 2012-12-01 ENCOUNTER — Telehealth: Payer: Self-pay | Admitting: Medical Oncology

## 2012-12-01 NOTE — Telephone Encounter (Signed)
Pt called and states that his allergies are really bothering him. His nose is running and has a dry cough due to the pollen. He would like to know what he can take. He has taken claritin in the past and it has helped. I told him he can take this and use some saline nasal spray. He voiced understanding.

## 2012-12-02 ENCOUNTER — Other Ambulatory Visit (HOSPITAL_COMMUNITY): Payer: Self-pay | Admitting: Oncology

## 2012-12-02 ENCOUNTER — Other Ambulatory Visit: Payer: Self-pay | Admitting: Oncology

## 2012-12-03 ENCOUNTER — Telehealth: Payer: Self-pay

## 2012-12-03 NOTE — Telephone Encounter (Signed)
Pt called confirming where to go for BMBX in AM. reinstructed NPO after MN except blood pressure med in AM with sip of water.

## 2012-12-04 ENCOUNTER — Ambulatory Visit (HOSPITAL_COMMUNITY)
Admission: RE | Admit: 2012-12-04 | Discharge: 2012-12-04 | Disposition: A | Discharge status: Home / Self Care | Payer: Medicare Other | Source: Ambulatory Visit | Attending: Oncology | Admitting: Oncology

## 2012-12-04 ENCOUNTER — Other Ambulatory Visit: Payer: Medicare Other | Admitting: Lab

## 2012-12-04 VITALS — BP 124/72 | HR 62 | Temp 98.3°F | Resp 18 | Ht 69.0 in | Wt 161.0 lb

## 2012-12-04 DIAGNOSIS — C9000 Multiple myeloma not having achieved remission: Secondary | ICD-10-CM | POA: Insufficient documentation

## 2012-12-04 LAB — CBC WITH DIFFERENTIAL/PLATELET
Basophils Relative: 0 % (ref 0–1)
HCT: 40 % (ref 39.0–52.0)
Hemoglobin: 13.9 g/dL (ref 13.0–17.0)
Lymphocytes Relative: 19 % (ref 12–46)
Lymphs Abs: 1 10*3/uL (ref 0.7–4.0)
Monocytes Absolute: 0.7 10*3/uL (ref 0.1–1.0)
Monocytes Relative: 14 % — ABNORMAL HIGH (ref 3–12)
Neutro Abs: 3.4 10*3/uL (ref 1.7–7.7)
Neutrophils Relative %: 66 % (ref 43–77)
RBC: 4.28 MIL/uL (ref 4.22–5.81)
WBC: 5.1 10*3/uL (ref 4.0–10.5)

## 2012-12-04 MED ORDER — LIDOCAINE HCL 1 % IJ SOLN
INTRAMUSCULAR | Status: AC
  Filled 2012-12-04: qty 20

## 2012-12-04 MED ORDER — SODIUM CHLORIDE 0.9 % IV SOLN
INTRAVENOUS | Status: DC
  Administered 2012-12-04: 500 mL via INTRAVENOUS

## 2012-12-04 MED ORDER — MEPERIDINE HCL 50 MG/ML IJ SOLN
50.0000 mg | Freq: Once | INTRAMUSCULAR | Status: AC
  Administered 2012-12-04: 25 mg via INTRAVENOUS

## 2012-12-04 MED ORDER — MIDAZOLAM HCL 5 MG/ML IJ SOLN
INTRAMUSCULAR | Status: AC | PRN
  Administered 2012-12-04: 2 mg via INTRAVENOUS

## 2012-12-04 NOTE — Sedation Documentation (Signed)
Medication dose calculated and verified for: Trevor Michael  Total of demerol 5mg  , versed 2mg 

## 2012-12-04 NOTE — ED Notes (Signed)
dsg cdi 

## 2012-12-04 NOTE — ED Notes (Signed)
Family updated as to patient's status.

## 2012-12-04 NOTE — ED Notes (Signed)
Vital signs stable. 

## 2012-12-04 NOTE — ED Notes (Signed)
dsg cdi  Pt drinking and talking c wife  Awake and alert

## 2012-12-04 NOTE — ED Notes (Signed)
Patient denies pain and is resting comfortably.  

## 2012-12-04 NOTE — ED Notes (Signed)
MD at bedside. 

## 2012-12-04 NOTE — ED Notes (Signed)
Patient is resting comfortably. 

## 2012-12-04 NOTE — Procedures (Signed)
BONE MARROW NOTE   Informed consent was obtained and potential risks including bleeding, infection and pain were reviewed with the patient.   Pre-procedure time out observed, Mallampati class III, ASA score II.  Bone marrow aspirate and biopsy with flow studies, cytogenetics, and FISH studies for multiple myeloma obtained from left superior posterior iliac spine under sterile conditions, 1% local lidocaine and Demerol 50 mg/Versed 2 mg IV. Pt tolerated procedure well. Good specimen obtained.   Indication: Restaging after treatment of multiple myeloma.  Samul Dada , MD 12/04/2012 8:37 AM

## 2012-12-04 NOTE — ED Notes (Signed)
dsg applied to sacral area by md/cdi 

## 2012-12-05 ENCOUNTER — Ambulatory Visit (HOSPITAL_BASED_OUTPATIENT_CLINIC_OR_DEPARTMENT_OTHER): Payer: Medicare Other

## 2012-12-05 ENCOUNTER — Ambulatory Visit: Payer: Medicare Other

## 2012-12-05 ENCOUNTER — Encounter: Payer: Self-pay | Admitting: Oncology

## 2012-12-05 ENCOUNTER — Other Ambulatory Visit (HOSPITAL_BASED_OUTPATIENT_CLINIC_OR_DEPARTMENT_OTHER): Payer: Medicare Other | Admitting: Lab

## 2012-12-05 ENCOUNTER — Ambulatory Visit (HOSPITAL_BASED_OUTPATIENT_CLINIC_OR_DEPARTMENT_OTHER): Payer: Medicare Other | Admitting: Oncology

## 2012-12-05 VITALS — BP 153/71 | HR 66 | Temp 97.3°F | Resp 20 | Ht 69.0 in | Wt 161.7 lb

## 2012-12-05 DIAGNOSIS — C9000 Multiple myeloma not having achieved remission: Secondary | ICD-10-CM

## 2012-12-05 DIAGNOSIS — R809 Proteinuria, unspecified: Secondary | ICD-10-CM

## 2012-12-05 DIAGNOSIS — Z5112 Encounter for antineoplastic immunotherapy: Secondary | ICD-10-CM

## 2012-12-05 LAB — CBC WITH DIFFERENTIAL/PLATELET
BASO%: 0.6 % (ref 0.0–2.0)
Basophils Absolute: 0 10*3/uL (ref 0.0–0.1)
EOS%: 0.8 % (ref 0.0–7.0)
HCT: 39.3 % (ref 38.4–49.9)
HGB: 13.3 g/dL (ref 13.0–17.1)
MCH: 31.8 pg (ref 27.2–33.4)
MONO#: 0.5 10*3/uL (ref 0.1–0.9)
NEUT%: 68.2 % (ref 39.0–75.0)
RDW: 14 % (ref 11.0–14.6)
WBC: 4.8 10*3/uL (ref 4.0–10.3)
lymph#: 0.9 10*3/uL (ref 0.9–3.3)

## 2012-12-05 LAB — COMPREHENSIVE METABOLIC PANEL (CC13)
Alkaline Phosphatase: 125 U/L (ref 40–150)
CO2: 26 mEq/L (ref 22–29)
Creatinine: 0.8 mg/dL (ref 0.7–1.3)
Glucose: 87 mg/dl (ref 70–99)
Total Bilirubin: 0.35 mg/dL (ref 0.20–1.20)

## 2012-12-05 MED ORDER — BORTEZOMIB CHEMO SQ INJECTION 3.5 MG (2.5MG/ML)
1.6000 mg/m2 | Freq: Once | INTRAMUSCULAR | Status: AC
  Administered 2012-12-05: 3 mg via SUBCUTANEOUS
  Filled 2012-12-05: qty 3

## 2012-12-05 MED ORDER — ONDANSETRON 8 MG/50ML IVPB (CHCC)
8.0000 mg | Freq: Once | INTRAVENOUS | Status: AC
  Administered 2012-12-05: 8 mg via INTRAVENOUS

## 2012-12-05 MED ORDER — DEXAMETHASONE SODIUM PHOSPHATE 4 MG/ML IJ SOLN
40.0000 mg | Freq: Once | INTRAMUSCULAR | Status: AC
  Administered 2012-12-05: 40 mg via INTRAVENOUS

## 2012-12-05 MED ORDER — SODIUM CHLORIDE 0.9 % IV SOLN
Freq: Once | INTRAVENOUS | Status: AC
  Administered 2012-12-05: 11:00:00 via INTRAVENOUS

## 2012-12-05 MED ORDER — ZOLEDRONIC ACID 4 MG/100ML IV SOLN
4.0000 mg | Freq: Once | INTRAVENOUS | Status: AC
  Administered 2012-12-05: 4 mg via INTRAVENOUS
  Filled 2012-12-05: qty 100

## 2012-12-05 MED ORDER — SODIUM CHLORIDE 0.9 % IV SOLN
600.0000 mg | Freq: Once | INTRAVENOUS | Status: AC
  Administered 2012-12-05: 600 mg via INTRAVENOUS
  Filled 2012-12-05: qty 30

## 2012-12-05 NOTE — Patient Instructions (Signed)
[facility] Discharge Instructions for Patients Receiving Chemotherapy  Today you received the following chemotherapy agents cytoxan. Velcade, zometa  To help prevent nausea and vomiting after your treatment, we encourage you to take your nausea medication as needed   If you develop nausea and vomiting that is not controlled by your nausea medication, call the clinic. If it is after clinic hours your family physician or the after hours number for the clinic or go to the Emergency Department.   BELOW ARE SYMPTOMS THAT SHOULD BE REPORTED IMMEDIATELY:  *FEVER GREATER THAN 100.5 F  *CHILLS WITH OR WITHOUT FEVER  NAUSEA AND VOMITING THAT IS NOT CONTROLLED WITH YOUR NAUSEA MEDICATION  *UNUSUAL SHORTNESS OF BREATH  *UNUSUAL BRUISING OR BLEEDING  TENDERNESS IN MOUTH AND THROAT WITH OR WITHOUT PRESENCE OF ULCERS  *URINARY PROBLEMS  *BOWEL PROBLEMS  UNUSUAL RASH Items with * indicate a potential emergency and should be followed up as soon as possible.  The clinic phone number is [phone].   I have been informed and understand all the instructions given to me. I know to contact the clinic, my physician, or go to the Emergency Department if any problems should occur. I do not have any questions at this time, but understand that I may call the clinic during office hours   should I have any questions or need assistance in obtaining follow up care.    __________________________________________  _____________  __________ Signature of Patient or Authorized Representative            Date                   Time    __________________________________________ Nurse's Signature

## 2012-12-05 NOTE — Progress Notes (Signed)
This office note has been dictated.  #[id number]

## 2012-12-06 ENCOUNTER — Ambulatory Visit (HOSPITAL_BASED_OUTPATIENT_CLINIC_OR_DEPARTMENT_OTHER): Payer: Medicare Other

## 2012-12-06 VITALS — BP 149/72 | HR 89 | Temp 97.5°F | Resp 20

## 2012-12-06 DIAGNOSIS — C9000 Multiple myeloma not having achieved remission: Secondary | ICD-10-CM

## 2012-12-06 DIAGNOSIS — Z5189 Encounter for other specified aftercare: Secondary | ICD-10-CM

## 2012-12-06 MED ORDER — PEGFILGRASTIM INJECTION 6 MG/0.6ML
6.0000 mg | Freq: Once | SUBCUTANEOUS | Status: AC
  Administered 2012-12-06: 6 mg via SUBCUTANEOUS

## 2012-12-06 NOTE — Progress Notes (Signed)
CC:   [name], MD [name], M.D. [name], Ph.D., M.D. [name], M.D. [name], MD [name], MD [name], MD  PROBLEM LIST:  1. IgA lambda multiple myeloma diagnosed in late October 2013. The patient  has anemia, thrombocytopenia, multiple bone lesions, renal  insufficiency, and monoclonal proteins in the serum and urine. Bone  marrow carried out on 06/24/2012 yielded 48% plasma cells by morphology.  Cytogenetic analysis revealed the presence of normal male chromosomes  with no observable clonal chromosomal abnormalities. There was a single  cell with additional chromosome material on 3q, loss of chromosome 13  and a marker chromosome. DNA probe panel disclosed a gain of chromosome  11 which is consistent with abnormalities associated with multiple  myeloma. A 24-hour urine collection on 06/27/2012 yielded 17.1 g of  protein of which 16.9 g was free lambda light chains. Beta 2  microglobulin was 5.8. Albumin 3.2. IgG level on 06/19/2012 was 6320.  Myeloma is ISS stage III.  Chemotherapy with Velcade, Cytoxan, and Decadron was started on  07/01/2012. Radiation treatments were administered to the right humerus 25 Gy in 10 fractions from 07/10/2012 through 07/28/2012. Neulasta was started on 08/16/2012 because of leukopenia and neutropenia. Zometa was started on 09/11/2012 after the patient had received dental clearance. Doses of the current chemotherapy program utilizing subcutaneous Velcade, IV Cytoxan, and IV Decadron were increased on 11/06/2012.  2. Renal insufficiency. Creatinine clearance on 06/27/2012 was 68  mL/min.  3. A 2.3 cm lytic lesion in the right humerus at risk for pathologic  Fracture. Radiation treatments were administered to the right humerus 25 Gy in 10 fractions from 07/10/2012 through 07/28/2012.  4. Hypertension.  5. Benign prostate hypertrophy and elevated prostate specific antigen,  under the care of Dr.  [name]. The patient had a prostate  biopsy in 2008 that was benign.  6. Dyslipidemia.  7. Arthritis involving the cervical spine  8. Glaucoma.   MEDICATIONS:  Reviewed and recorded. Current Outpatient Prescriptions  Medication Sig Dispense Refill  . acetaminophen (TYLENOL) 500 MG tablet Take 1,000 mg by mouth every 6 (six) hours as needed. For pain      . acyclovir (ZOVIRAX) 400 MG tablet Take 1 tablet (400 mg total) by mouth 2 (two) times daily.  60 tablet  6  . allopurinol (ZYLOPRIM) 100 MG tablet Take 100 mg by mouth daily.      Marland Kitchen amLODipine (NORVASC) 5 MG tablet Take 5 mg by mouth at bedtime.      Marland Kitchen atenolol-chlorthalidone (TENORETIC) 50-25 MG per tablet Take 1 tablet by mouth every morning.      . bimatoprost (LUMIGAN) 0.03 % ophthalmic drops Place 1 drop into both eyes at bedtime.        . brimonidine-timolol (COMBIGAN) 0.2-0.5 % ophthalmic solution Place 1 drop into both eyes 2 (two) times daily.        . clindamycin (CLEOCIN T) 1 % lotion Apply 1 application topically daily as needed (for shaving).       Marland Kitchen esomeprazole (NEXIUM) 40 MG capsule Take 40 mg by mouth daily before breakfast.      . KLOR-CON M20 20 MEQ tablet TAKE 1 TABLET (20 MEQ TOTAL) BY MOUTH DAILY.  30 tablet  3  . loratadine (CLARITIN) 10 MG tablet Take 10 mg by mouth daily as needed for allergies.      Marland Kitchen ondansetron (ZOFRAN) 8 MG tablet Take 8 mg by mouth every 8 (eight)  hours as needed for nausea.      . prochlorperazine (COMPAZINE) 10 MG tablet Take 10 mg by mouth every 6 (six) hours as needed (nausea).      . simvastatin (ZOCOR) 20 MG tablet Take 20 mg by mouth at bedtime.      . sodium bicarbonate 650 MG tablet Take 1,300 mg by mouth 2 (two) times daily.      . vitamin B-12 (CYANOCOBALAMIN) 1000 MCG tablet Take 1,000 mcg by mouth daily.       No current facility-administered medications for this visit.    TREATMENT PROGRAM:  -Velcade 3.0 mg subcutaneous every 2 weeks.  -Cytoxan 600 mg IV every 2  weeks.  -Decadron 40 mg IV every 2 weeks.  Doses of chemotherapy were increased on 11/06/2012.   -Neulasta 6 mg subcu on day 2, repeated every 2 weeks. Neulasta was  started on 08/16/2012.   -Zometa 4 mg IV was started on 09/11/2012 and will be administered every  4 weeks.    IMMUNIZATIONS:  1. Pneumovax was administered on 07/07/2012.  2. Flu shot was given in November 2013.    SMOKING HISTORY: The patient has never smoked cigarettes.   HISTORY:  Heman Que was seen today for followup of his IgG lambda multiple myeloma with heavy proteinuria, stage III, diagnosed in late October 2013.  Mr. Montesinos was last seen by [place] on 11/06/2012.  He is accompanied today by his wife, [name].  Mr. Liotta received his last chemotherapy on 04/03 and prior to that on 11/06/2012.  He receives chemotherapy with Cytoxan, Velcade, and Decadron every 2 weeks.  He is supposed to be receiving Zometa IV every 4 weeks; however, it looks like he did not receive his dose on 03/20 as ordered.  His last dose of Zometa was on 10/09/2012.  Zometa was started on 09/11/2012, and thus today will be the third dose of Zometa.  The patient had a bone marrow aspirate and biopsy yesterday.  He tolerated this well.  He continues to tolerate his chemotherapy treatments well without any problems.  He has no neuropathy.  He occasionally has some night sweats once or twice a week, actually less than he was having prior to treatment.  He denies any pain.  He is having no arm pain, right arm pain where he had a lytic lesion which underwent radiation treatments.  He is scheduled to see Dr. Darrelyn Hillock within the next several weeks.  The patient is really without complaints today.  He has no scheduled appointment yet with Dr. Barbaraann Boys, who is awaiting the results of the bone marrow test.  The patient also has not submitted his 24-hour urine specimen.  PHYSICAL EXAMINATION:  Mr. Swider looks well.  Weight is 161  pounds 11.2 ounces.  Height 5 feet 9 inches.  Body surface area 1.89 sq m. Blood pressure today 153/71.  Other vital signs are normal.  There is no scleral icterus.  There is some conjunctival injection bilaterally which seems to be less than it has been.  Mouth and pharynx are benign.  No peripheral adenopathy palpable.  Heart and lungs are normal.  Abdomen is benign.  Extremities:  No peripheral edema or clubbing.  Neurologic: Normal.  No musculoskeletal pain.  Right arm is benign.  The patient does not have a Port-A-Cath or central catheter.  LABORATORY DATA:  Today white count 4.8, ANC 3.3, hemoglobin 13.3, hematocrit 39.3, and platelets 158,000.  Chemistries today are pending. Chemistries from 11/06/2012 were entirely normal, although  the total protein was 3.8 and the albumin 3.5.  IgA level on 03/20 was 3250. Lambda free light chains were 59.7.  It will be recalled that the IgA level and the lambda free light chains had actually increased on 03/20. Back on January 23rd, the IgA level had gotten down to 2280.  There was suggestion therefore of protein levels increasing, which is why we increased the doses of chemotherapy.  On 11/20/2012, the IgA level was 2700.  IMAGING STUDIES:  1. 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 There is foraminal  encroachment at this level, left greater than right. There were  degenerative changes at other levels which were described in detail  in the body of the report.  2. Chest x-ray, 2 view, from 11/16/2009 showed no acute findings.  3. CT abdomen and pelvis without IV contrast on 06/18/2012 showed  multiple lytic bone lesions highly suspicious for metastatic  disease or multiple myeloma. The largest lesion seen with some  cortical destruction in the anterior aspect of the iliac bone  measured 3 cm. There was no  evidence of colitis or diverticulitis.  There was no evidence for hydronephrosis or hydroureter. There  were bilateral probable renal cysts. There was an enlarged  prostate gland with indentation of the urinary bladder base. There  was a small right hydrocele.  4. Metastatic bone survey from 06/19/2012 showed lytic lesions in the  calvaria and right hemipelvis and a possible lesion in the right  humerus. This latter lesion was not specifically mentioned in the  report.  5. Nuclear bone scan on 06/19/2012 showed some increased uptake  involving the femur and humerus bilaterally, possibly reflecting  changes of metabolic bone disease. There was a lack of correlation  with the bone scan and the findings seen on CT and x-rays, raising  the possibility of multiple myeloma.  6. Renal ultrasound on 06/19/2012 showed increased echogenicity of the  renal parenchyma bilaterally consistent with renal medical disease.  There was no evidence for obstruction. There was a slightly  enlarged prostate gland.  7. CT scan of the right humerus without IV contrast on 06/20/2012  showed 2.3-cm lucent lesion with probable mild endosteal thinning  anteriorly near the bicipital groove. There were several small  lytic lesions present within the humeral head, scapula, glenoid,  and proximal radius. No pathologic fracture or soft tissue mass  was seen. There were a few small nodules within the visualized  right lung, the largest of which was 4 mm in the upper lobe on  image 78.     PROCEDURES:  1. CT-guided bone marrow aspirate and biopsy was carried out on  06/24/2012 and showed 48% plasma cells. 2. Bone marrow aspirate and biopsy were carried out on 12/04/2012.  Results are currently pending.   IMPRESSION/PLAN:  Clinically, Mr. Flury continues to do well.  He is asymptomatic from the standpoint of his disease as well as treatment. He is due for another course of treatment today as follows: Velcade  3.0 mg subcutaneous, Cytoxan 600 mg IV, Decadron 40 mg IV.  The patient is also due for Zometa today, 4 mg IV.  He will come back tomorrow and get Neulasta 6 mg subcu.  We hopefully will have the results of the bone marrow from yesterday, and hopefully it will be a good result.  That result will be forwarded to  Dr. Barbaraann Boys.  If the bone marrow result is satisfactory, the patient will undergo high-dose chemotherapy and stem cell rescue after having stem cells harvested.  If the bone marrow result is not satisfactory, then I suspect the patient will require alternative chemotherapy.  I have scheduled Mr. Louissaint for chemotherapy on May 2nd, preceded by CBC and IgA level.  If he is to get chemotherapy on May 2nd, he will need Neulasta on May 3rd.  I have also schedule him for a return visit on or about May 16th, at which time I will have CBC, chemistries, IgA level, serum light chains, and a serum protein electrophoresis.  The patient has also been scheduled for Zometa that day in addition to the chemotherapy and Neulasta subcu on May 17th.  The patient still has his a urine collection container from [facility].  He will probably need to submit a 24-hour urine prior to returning to [facility] for reassessment.    ______________________________ Samul Dada, M.D. DSM/MEDQ  D:  12/05/2012  T:  12/06/2012  Job:  [id number]

## 2012-12-07 ENCOUNTER — Other Ambulatory Visit: Payer: Self-pay | Admitting: Oncology

## 2012-12-08 ENCOUNTER — Ambulatory Visit: Payer: Medicare Other

## 2012-12-08 ENCOUNTER — Telehealth: Payer: Self-pay | Admitting: Oncology

## 2012-12-09 LAB — BETA 2 MICROGLOBULIN, SERUM: Beta-2 Microglobulin: 2.43 mg/L — ABNORMAL HIGH (ref 1.01–1.73)

## 2012-12-09 LAB — KAPPA/LAMBDA LIGHT CHAINS
Kappa free light chain: <0.03 mg/dL — ABNORMAL LOW (ref 0.33–1.94)
Lambda Free Lght Chn: 55.4 mg/dL — ABNORMAL HIGH (ref 0.57–2.63)

## 2012-12-09 LAB — PROTEIN ELECTROPHORESIS, SERUM
Alpha-1-Globulin: 4.5 % (ref 2.9–4.9)
Gamma Globulin: 4.6 % — ABNORMAL LOW (ref 11.1–18.8)
M-Spike, %: 1.99 g/dL
Total Protein, Serum Electrophoresis: 7.7 g/dL (ref 6.0–8.3)

## 2012-12-09 LAB — IGA: IgA: 2830 mg/dL — ABNORMAL HIGH (ref 68–379)

## 2012-12-14 ENCOUNTER — Other Ambulatory Visit (HOSPITAL_COMMUNITY): Payer: Self-pay | Admitting: Internal Medicine

## 2012-12-15 ENCOUNTER — Telehealth: Payer: Self-pay

## 2012-12-15 LAB — CHROMOSOME ANALYSIS, BONE MARROW

## 2012-12-15 LAB — TISSUE HYBRIDIZATION (BONE MARROW)-NCBH

## 2012-12-15 NOTE — Telephone Encounter (Signed)
Faxed BMBX report to Dr Lillia Abed office

## 2012-12-16 ENCOUNTER — Telehealth: Payer: Self-pay

## 2012-12-16 ENCOUNTER — Encounter: Payer: Self-pay | Admitting: Oncology

## 2012-12-16 NOTE — Progress Notes (Signed)
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 The first cell line was chromosomally normal (25%). The second cell line was cytogenetically abnormal missing a Y chromosome. 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

## 2012-12-16 NOTE — Telephone Encounter (Signed)
Cytogenetics report acquired from pathology and faxed to Dr Barbaraann Boys.

## 2012-12-19 ENCOUNTER — Telehealth: Payer: Self-pay | Admitting: Medical Oncology

## 2012-12-19 ENCOUNTER — Other Ambulatory Visit (HOSPITAL_BASED_OUTPATIENT_CLINIC_OR_DEPARTMENT_OTHER): Payer: Medicare Other | Admitting: Lab

## 2012-12-19 ENCOUNTER — Other Ambulatory Visit: Payer: Self-pay | Admitting: *Deleted

## 2012-12-19 ENCOUNTER — Ambulatory Visit (HOSPITAL_BASED_OUTPATIENT_CLINIC_OR_DEPARTMENT_OTHER): Payer: Medicare Other

## 2012-12-19 VITALS — BP 177/69 | HR 62 | Temp 97.7°F | Resp 20

## 2012-12-19 DIAGNOSIS — C9000 Multiple myeloma not having achieved remission: Secondary | ICD-10-CM

## 2012-12-19 DIAGNOSIS — Z5111 Encounter for antineoplastic chemotherapy: Secondary | ICD-10-CM

## 2012-12-19 DIAGNOSIS — Z5112 Encounter for antineoplastic immunotherapy: Secondary | ICD-10-CM

## 2012-12-19 LAB — CBC WITH DIFFERENTIAL/PLATELET
Basophils Absolute: 0 10*3/uL (ref 0.0–0.1)
Eosinophils Absolute: 0 10*3/uL (ref 0.0–0.5)
HCT: 39.4 % (ref 38.4–49.9)
HGB: 13.3 g/dL (ref 13.0–17.1)
LYMPH%: 17 % (ref 14.0–49.0)
MONO#: 0.6 10*3/uL (ref 0.1–0.9)
NEUT#: 5.5 10*3/uL (ref 1.5–6.5)
NEUT%: 74.6 % (ref 39.0–75.0)
Platelets: 164 10*3/uL (ref 140–400)
WBC: 7.4 10*3/uL (ref 4.0–10.3)
nRBC: 0 % (ref 0–0)

## 2012-12-19 MED ORDER — SODIUM CHLORIDE 0.9 % IV SOLN
600.0000 mg | Freq: Once | INTRAVENOUS | Status: AC
  Administered 2012-12-19: 600 mg via INTRAVENOUS
  Filled 2012-12-19: qty 30

## 2012-12-19 MED ORDER — DEXAMETHASONE SODIUM PHOSPHATE 20 MG/5ML IJ SOLN
40.0000 mg | Freq: Once | INTRAMUSCULAR | Status: AC
  Administered 2012-12-19: 40 mg via INTRAVENOUS

## 2012-12-19 MED ORDER — SODIUM CHLORIDE 0.9 % IV SOLN
INTRAVENOUS | Status: AC
  Administered 2012-12-19: 11:00:00 via INTRAVENOUS

## 2012-12-19 MED ORDER — ONDANSETRON 8 MG/50ML IVPB (CHCC)
8.0000 mg | Freq: Once | INTRAVENOUS | Status: AC
  Administered 2012-12-19: 8 mg via INTRAVENOUS

## 2012-12-19 MED ORDER — BORTEZOMIB CHEMO SQ INJECTION 3.5 MG (2.5MG/ML)
1.6000 mg/m2 | Freq: Once | INTRAMUSCULAR | Status: AC
  Administered 2012-12-19: 3 mg via SUBCUTANEOUS
  Filled 2012-12-19: qty 3

## 2012-12-19 NOTE — Telephone Encounter (Signed)
Pt was here this am for chemo and I asked him if he has heard from Dr. Barbaraann Boys regarding his transplant. He states that he had spoken with Vinnie Langton earlier in the week and she was suppose to call him. He has not heard anything. I called Vinnie Langton and she states that Dr. Barbaraann Boys is out of the office until Monday. She has all the information and will call Dr. Arline Asp on Monday and I will call pt. Pt notified and he voiced understanding.

## 2012-12-19 NOTE — Patient Instructions (Addendum)
[facility] Discharge Instructions for Patients Receiving Chemotherapy  Today you received the following chemotherapy agents Cytoxan and Velcade.  To help prevent nausea and vomiting after your treatment, we encourage you to take your nausea medication as prescribed.    If you develop nausea and vomiting that is not controlled by your nausea medication, call the clinic. If it is after clinic hours your family physician or the after hours number for the clinic or go to the Emergency Department.   BELOW ARE SYMPTOMS THAT SHOULD BE REPORTED IMMEDIATELY:  *FEVER GREATER THAN 100.5 F  *CHILLS WITH OR WITHOUT FEVER  NAUSEA AND VOMITING THAT IS NOT CONTROLLED WITH YOUR NAUSEA MEDICATION  *UNUSUAL SHORTNESS OF BREATH  *UNUSUAL BRUISING OR BLEEDING  TENDERNESS IN MOUTH AND THROAT WITH OR WITHOUT PRESENCE OF ULCERS  *URINARY PROBLEMS  *BOWEL PROBLEMS  UNUSUAL RASH Items with * indicate a potential emergency and should be followed up as soon as possible.  Please let the nurse know about any problems that you may have experienced. Feel free to call the clinic you have any questions or concerns. The clinic phone number is [phone].   I have been informed and understand all the instructions given to me. I know to contact the clinic, my physician, or go to the Emergency Department if any problems should occur. I do not have any questions at this time, but understand that I may call the clinic during office hours   should I have any questions or need assistance in obtaining follow up care.    __________________________________________  _____________  __________ Signature of Patient or Authorized Representative            Date                   Time    __________________________________________ Nurse's Signature

## 2012-12-20 ENCOUNTER — Ambulatory Visit (HOSPITAL_BASED_OUTPATIENT_CLINIC_OR_DEPARTMENT_OTHER): Payer: Medicare Other

## 2012-12-20 VITALS — BP 162/75 | HR 76 | Temp 97.7°F

## 2012-12-20 DIAGNOSIS — Z5189 Encounter for other specified aftercare: Secondary | ICD-10-CM

## 2012-12-20 DIAGNOSIS — C9000 Multiple myeloma not having achieved remission: Secondary | ICD-10-CM

## 2012-12-20 MED ORDER — PEGFILGRASTIM INJECTION 6 MG/0.6ML
6.0000 mg | Freq: Once | SUBCUTANEOUS | Status: AC
  Administered 2012-12-20: 6 mg via SUBCUTANEOUS

## 2012-12-22 ENCOUNTER — Other Ambulatory Visit: Payer: Self-pay | Admitting: Internal Medicine

## 2012-12-23 ENCOUNTER — Telehealth: Payer: Self-pay

## 2012-12-23 NOTE — Telephone Encounter (Signed)
Trevor Michael lvm that Dr Barbaraann Boys wants to proceed with work up for autologous stem cell transplant. I called back asking for his schedule b/c he has an infusion appt at [facility] for 01/02/13.

## 2012-12-23 NOTE — Telephone Encounter (Signed)
lvm that after confiring with Vinnie Langton RN at Dr Lillia Abed office and Dr Arline Asp that we will keep his lab/MD/inf appt on 5/16. And inj appt on 5/17.

## 2012-12-23 NOTE — Telephone Encounter (Signed)
lvm asking if [facility] is going ahead with bone marrow transplant or if Dr Arline Asp needs to give him more chemo tx

## 2012-12-24 ENCOUNTER — Telehealth: Payer: Self-pay

## 2012-12-24 ENCOUNTER — Encounter: Payer: Self-pay | Admitting: Oncology

## 2012-12-24 NOTE — Telephone Encounter (Signed)
S/w pt about changes Dr Arline Asp is making in treatment plan after talking with Dr Barbaraann Boys. Pt will come to [facility] on 5/8 to sign Revlimid papers. Pt will keep appt at [facility] on 5/16.

## 2012-12-24 NOTE — Progress Notes (Signed)
I spoke with Dr. Barbaraann Boys today. The patient's response to treatment has plateaued. Most recent IgA level was 3180 on 12/19/2012. Bone marrow carried out on 12/04/2012 showed 24% plasma cells.  We are going to make the following changes: -Cytoxan will be discontinued.  -Patient will receive Revlimid 10 mg daily 2 weeks on 2 weeks off. -Decadron will be given by mouth instead of IV. -We will need to be sure the patient is taking aspirin 81 mg daily -We will continue Velcade.  If the patient does not appear to be responding satisfactorily to this new regimen after one to 2 months, we need to communicate with Dr. Barbaraann Boys The patient may be eligible for clinical trial utilizing carfilzomib.

## 2012-12-25 ENCOUNTER — Telehealth: Payer: Self-pay

## 2012-12-25 ENCOUNTER — Other Ambulatory Visit: Payer: Self-pay

## 2012-12-25 DIAGNOSIS — C9000 Multiple myeloma not having achieved remission: Secondary | ICD-10-CM

## 2012-12-25 MED ORDER — LENALIDOMIDE 10 MG PO CAPS: 10.0000 mg | ORAL_CAPSULE | Freq: Every day | ORAL | Status: DC

## 2012-12-25 NOTE — Progress Notes (Signed)
1st prescription for Revlimid taken to [facility].

## 2012-12-25 NOTE — Telephone Encounter (Signed)
Received fax confirming pt has been enrolled in REVLIMID REMS program

## 2012-12-26 ENCOUNTER — Other Ambulatory Visit: Payer: Self-pay | Admitting: *Deleted

## 2012-12-26 ENCOUNTER — Telehealth: Payer: Self-pay

## 2012-12-26 DIAGNOSIS — C9000 Multiple myeloma not having achieved remission: Secondary | ICD-10-CM

## 2012-12-26 MED ORDER — LENALIDOMIDE 10 MG PO CAPS: 10.0000 mg | ORAL_CAPSULE | Freq: Every day | ORAL | Status: DC

## 2012-12-26 NOTE — Telephone Encounter (Signed)
Trevor Michael from [place] stated she received a urgent request for revlimid. The pt had medicare D so we need to submit to medicare. His medicare ID# [id number]. This message was left as voice message for [place].

## 2012-12-31 NOTE — Telephone Encounter (Signed)
error 

## 2012-12-31 NOTE — Telephone Encounter (Signed)
Rec'd fax confirmation that Revlimid has shipped for delivery on 12/31/12

## 2013-01-02 ENCOUNTER — Other Ambulatory Visit (HOSPITAL_BASED_OUTPATIENT_CLINIC_OR_DEPARTMENT_OTHER): Payer: BLUE CROSS/BLUE SHIELD | Admitting: Lab

## 2013-01-02 ENCOUNTER — Ambulatory Visit (HOSPITAL_BASED_OUTPATIENT_CLINIC_OR_DEPARTMENT_OTHER): Payer: Medicare Other

## 2013-01-02 ENCOUNTER — Ambulatory Visit (HOSPITAL_BASED_OUTPATIENT_CLINIC_OR_DEPARTMENT_OTHER): Payer: Medicare Other | Admitting: Oncology

## 2013-01-02 ENCOUNTER — Other Ambulatory Visit: Payer: Self-pay | Admitting: Oncology

## 2013-01-02 ENCOUNTER — Other Ambulatory Visit: Payer: Medicare Other

## 2013-01-02 ENCOUNTER — Encounter: Payer: Self-pay | Admitting: Oncology

## 2013-01-02 ENCOUNTER — Encounter: Payer: Self-pay | Admitting: Medical Oncology

## 2013-01-02 ENCOUNTER — Telehealth: Payer: Self-pay | Admitting: Medical Oncology

## 2013-01-02 VITALS — BP 152/73 | HR 64 | Temp 96.9°F | Resp 18 | Ht 69.0 in | Wt 161.3 lb

## 2013-01-02 DIAGNOSIS — C9000 Multiple myeloma not having achieved remission: Secondary | ICD-10-CM

## 2013-01-02 DIAGNOSIS — Z5111 Encounter for antineoplastic chemotherapy: Secondary | ICD-10-CM

## 2013-01-02 DIAGNOSIS — Z5112 Encounter for antineoplastic immunotherapy: Secondary | ICD-10-CM

## 2013-01-02 LAB — CBC WITH DIFFERENTIAL/PLATELET
Basophils Absolute: 0 10*3/uL (ref 0.0–0.1)
Eosinophils Absolute: 0 10*3/uL (ref 0.0–0.5)
HCT: 38 % — ABNORMAL LOW (ref 38.4–49.9)
HGB: 12.9 g/dL — ABNORMAL LOW (ref 13.0–17.1)
LYMPH%: 18.6 % (ref 14.0–49.0)
MONO#: 0.8 10*3/uL (ref 0.1–0.9)
NEUT#: 4.7 10*3/uL (ref 1.5–6.5)
NEUT%: 68.9 % (ref 39.0–75.0)
Platelets: 165 10*3/uL (ref 140–400)
RBC: 4.02 10*6/uL — ABNORMAL LOW (ref 4.20–5.82)
WBC: 6.8 10*3/uL (ref 4.0–10.3)
nRBC: 0 % (ref 0–0)

## 2013-01-02 MED ORDER — SODIUM CHLORIDE 0.9 % IV SOLN
Freq: Once | INTRAVENOUS | Status: AC
  Administered 2013-01-02: 14:00:00 via INTRAVENOUS

## 2013-01-02 MED ORDER — ONDANSETRON 8 MG/50ML IVPB (CHCC)
8.0000 mg | Freq: Once | INTRAVENOUS | Status: AC
  Administered 2013-01-02: 8 mg via INTRAVENOUS

## 2013-01-02 MED ORDER — ZOLEDRONIC ACID 4 MG/100ML IV SOLN
4.0000 mg | Freq: Once | INTRAVENOUS | Status: AC
  Administered 2013-01-02: 4 mg via INTRAVENOUS
  Filled 2013-01-02: qty 100

## 2013-01-02 MED ORDER — DEXAMETHASONE SODIUM PHOSPHATE 20 MG/5ML IJ SOLN
40.0000 mg | Freq: Once | INTRAMUSCULAR | Status: AC
  Administered 2013-01-02: 40 mg via INTRAVENOUS

## 2013-01-02 MED ORDER — SODIUM CHLORIDE 0.9 % IV SOLN
600.0000 mg | Freq: Once | INTRAVENOUS | Status: AC
  Administered 2013-01-02: 600 mg via INTRAVENOUS
  Filled 2013-01-02: qty 30

## 2013-01-02 MED ORDER — BORTEZOMIB CHEMO SQ INJECTION 3.5 MG (2.5MG/ML)
1.6000 mg/m2 | Freq: Once | INTRAMUSCULAR | Status: AC
  Administered 2013-01-02: 3 mg via SUBCUTANEOUS
  Filled 2013-01-02: qty 3

## 2013-01-02 NOTE — Progress Notes (Signed)
This office note has been dictated.  #[id number]

## 2013-01-02 NOTE — Patient Instructions (Signed)
[facility] Discharge Instructions for Patients Receiving Chemotherapy  Today you received the following chemotherapy agents Cytoxan/Velcade/Zometa.  To help prevent nausea and vomiting after your treatment, we encourage you to take your nausea medication as directed.   If you develop nausea and vomiting that is not controlled by your nausea medication, call the clinic. If it is after clinic hours your family physician or the after hours number for the clinic or go to the Emergency Department.   BELOW ARE SYMPTOMS THAT SHOULD BE REPORTED IMMEDIATELY:  *FEVER GREATER THAN 100.5 F  *CHILLS WITH OR WITHOUT FEVER  NAUSEA AND VOMITING THAT IS NOT CONTROLLED WITH YOUR NAUSEA MEDICATION  *UNUSUAL SHORTNESS OF BREATH  *UNUSUAL BRUISING OR BLEEDING  TENDERNESS IN MOUTH AND THROAT WITH OR WITHOUT PRESENCE OF ULCERS  *URINARY PROBLEMS  *BOWEL PROBLEMS  UNUSUAL RASH Items with * indicate a potential emergency and should be followed up as soon as possible.  Feel free to call the clinic you have any questions or concerns. The clinic phone number is [phone].

## 2013-01-02 NOTE — Telephone Encounter (Signed)
Lm informing the pt that DSM would like to see him on 02/13/13 w/ labs @ 9:30am and ov @ 10am. i also made the pt aware that i would mail a letter/cal as well...td

## 2013-01-03 ENCOUNTER — Ambulatory Visit (HOSPITAL_BASED_OUTPATIENT_CLINIC_OR_DEPARTMENT_OTHER): Payer: Medicare Other

## 2013-01-03 VITALS — BP 162/78 | HR 79 | Temp 97.0°F | Resp 20

## 2013-01-03 DIAGNOSIS — C9000 Multiple myeloma not having achieved remission: Secondary | ICD-10-CM

## 2013-01-03 DIAGNOSIS — Z5189 Encounter for other specified aftercare: Secondary | ICD-10-CM

## 2013-01-03 MED ORDER — PEGFILGRASTIM INJECTION 6 MG/0.6ML
6.0000 mg | Freq: Once | SUBCUTANEOUS | Status: AC
  Administered 2013-01-03: 6 mg via SUBCUTANEOUS

## 2013-01-03 NOTE — Progress Notes (Signed)
CC:   [name], MD [name], M.D. [name], Ph.D., M.D. [name], M.D. [name], MD [name], MD [name], MD  PROBLEM LIST:  1. IgA lambda multiple myeloma diagnosed in late October 2013. The patient  has anemia, thrombocytopenia, multiple bone lesions, renal  insufficiency, and monoclonal proteins in the serum and urine. Bone  marrow carried out on 06/24/2012 yielded 48% plasma cells by morphology.  Cytogenetic analysis revealed the presence of normal male chromosomes  with no observable clonal chromosomal abnormalities. There was a single  cell with additional chromosome material on 3q, loss of chromosome 13  and a marker chromosome. DNA probe panel disclosed a gain of chromosome  11 which is consistent with abnormalities associated with multiple  myeloma. A 24-hour urine collection on 06/27/2012 yielded 17.1 g of  protein of which 16.9 g was free lambda light chains. Beta 2  microglobulin was 5.8. Albumin 3.2. IgG level on 06/19/2012 was 6320.  Myeloma is ISS stage III.   Chemotherapy with Velcade, Cytoxan, and Decadron was started on  07/01/2012. Radiation treatments were administered to the right humerus 25 Gy in 10 fractions from 07/10/2012 through 07/28/2012. Neulasta was started on 08/16/2012 because of leukopenia and neutropenia. Zometa was started on 09/11/2012 after the patient had received dental clearance. Doses of the current chemotherapy program utilizing subcutaneous Velcade, IV Cytoxan, and IV Decadron were increased on 11/06/2012.   Bone marrow aspirate and biopsy was carried out on 12/04/2012 and showed infiltration with 24% plasma cells.  Cytogenetic analysis revealed the presence of 2 clonal cell lines.  The first cell line was chromosomally normal (25%).  The 2nd cell line was cytogenetically abnormal, missing a Y chromosome.  A single cell with an extra chromosome 11, 14, 15, and 22, with an 11;14  translocation was observed.  FISH analysis showed a gain of chromosome 11 in 15% of the cells.  2. Renal insufficiency. Creatinine clearance on 06/27/2012 was 68  mL/min.  3. A 2.3 cm lytic lesion in the right humerus at risk for pathologic  Fracture. Radiation treatments were administered to the right humerus 25 Gy in 10 fractions from 07/10/2012 through 07/28/2012.  4. Hypertension.  5. Benign prostate hypertrophy and elevated prostate specific antigen,  under the care of Dr. [name]. The patient had a prostate  biopsy in 2008 that was benign.  6. Dyslipidemia.  7. Arthritis involving the cervical spine  8. Glaucoma.    MEDICATIONS:  Reviewed and recorded. Current Outpatient Prescriptions  Medication Sig Dispense Refill  . acetaminophen (TYLENOL) 500 MG tablet Take 1,000 mg by mouth every 6 (six) hours as needed. For pain      . acyclovir (ZOVIRAX) 400 MG tablet Take 1 tablet (400 mg total) by mouth 2 (two) times daily.  60 tablet  6  . allopurinol (ZYLOPRIM) 100 MG tablet Take 100 mg by mouth daily.      Marland Kitchen amLODipine (NORVASC) 5 MG tablet Take 5 mg by mouth at bedtime.      Marland Kitchen atenolol-chlorthalidone (TENORETIC) 50-25 MG per tablet Take 1 tablet by mouth every morning.      . bimatoprost (LUMIGAN) 0.03 % ophthalmic drops Place 1 drop into both eyes at bedtime.        . brimonidine-timolol (COMBIGAN) 0.2-0.5 % ophthalmic solution Place 1 drop into both eyes 2 (two) times daily.        . clindamycin (CLEOCIN T) 1 % lotion Apply 1 application topically  daily as needed (for shaving).       Marland Kitchen esomeprazole (NEXIUM) 40 MG capsule Take 40 mg by mouth daily before breakfast.      . KLOR-CON M20 20 MEQ tablet TAKE 1 TABLET (20 MEQ TOTAL) BY MOUTH DAILY.  30 tablet  3  . loratadine (CLARITIN) 10 MG tablet Take 10 mg by mouth daily as needed for allergies.      Marland Kitchen ondansetron (ZOFRAN) 8 MG tablet Take 8 mg by mouth every 8 (eight) hours as needed for nausea.      . prochlorperazine  (COMPAZINE) 10 MG tablet Take 10 mg by mouth every 6 (six) hours as needed (nausea).      . simvastatin (ZOCOR) 20 MG tablet Take 20 mg by mouth at bedtime.      . sodium bicarbonate 650 MG tablet TAKE 2 TABLETS (1,300 MG TOTAL) BY MOUTH 2 (TWO) TIMES DAILY.  120 tablet  5  . vitamin B-12 (CYANOCOBALAMIN) 1000 MCG tablet Take 1,000 mcg by mouth daily.       No current facility-administered medications for this visit.    TREATMENT PROGRAM:  -Velcade 3.0 mg subcutaneous every 2 weeks.  -Cytoxan 600 mg IV every 2 weeks.  -Decadron 40 mg IV every 2 weeks.  Doses of chemotherapy were increased on 11/06/2012.   -Neulasta 6 mg subcu on day 2, repeated every 2 weeks. Neulasta was  started on 08/16/2012.   -Zometa 4 mg IV was started on 09/11/2012 and will be administered every  4 weeks.    IMMUNIZATIONS:  1. Pneumovax was administered on 07/07/2012.  2. Flu shot was given in November 2013.    SMOKING HISTORY: The patient has never smoked cigarettes.    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  Trevor Michael was last seen by [place] on 12/05/2012.  He was seen at [facility] yesterday.  They are in the process of reviewing his data, but from what I understand, they are planning to go ahead with stem cell harvest utilizing Neupogen only.  Neupogen is to begin with injections here on 01/16/2013 and 01/17/2013.  The patient will then go down to [facility], where he will be for about 3 weeks.  They will be involved in harvesting stem cells and giving him high-dose therapy.  It will be recalled that we had carried out a bone marrow on 12/04/2012. Bone marrow still showed 24% plasma cells, down from 48% plasma cells on the initial bone marrow from 06/24/2012.  Recently the IgA level has been increasing.  Clinically the patient feels fine.  He has been getting some exercise, although he has  not been playing golf.  He denies any pain.  He has tolerated his chemotherapy treatments well.  It will be recalled that he receives Cytoxan, Velcade, and Decadron every 2 weeks, with Neulasta on day 2.  He receives Zometa every 4 weeks.  He is due for chemotherapy and Zometa today and Neulasta tomorrow.  PHYSICAL EXAMINATION:  General:  Trevor Michael continues to look quite well.  Vital Signs:  Weight is 161 pounds 4.8 ounces, height 5 feet 9 inches, body surface area 1.89 meters squared.  Blood pressure today 152/73.  Other vital signs are normal.  HEENT:  There is no scleral icterus, although there is conjunctival injection bilaterally.  Mouth and pharynx are benign.  No adenopathy.  Heart and lungs:  Normal. Abdomen:  Benign.  Extremities:  No peripheral edema or clubbing.  The right arm is benign, with no pain.  Neurologic:  Exam is normal.  Lines: The patient does not have a Port-A-Cath or central catheter.  LABORATORY DATA TODAY:  White count 6.8, ANC 4.7, hemoglobin 12.9, hematocrit 38.0, platelets 165,000.  Chemistries, IgA level, and serum light chains are pending.  On 12/05/2012 chemistries were normal.  Most recent IgA level on 12/19/2012 was 3180, up from 2830 on 12/05/2012. Lambda free light chains on 12/05/2012 were 55.40.  M-spike was 1.99 gm%.  Beta-2 microglobulin on 12/05/2012 was 2.43.  IMAGING STUDIES:  1. 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 There is foraminal  encroachment at this level, left greater than right. There were  degenerative changes at other levels which were described in detail  in the body of the report.  2. Chest x-ray, 2 view, from 11/16/2009 showed no acute findings.  3. CT abdomen and pelvis without IV contrast on 06/18/2012 showed  multiple lytic bone lesions highly suspicious for metastatic  disease or  multiple myeloma. The largest lesion seen with some  cortical destruction in the anterior aspect of the iliac bone  measured 3 cm. There was no evidence of colitis or diverticulitis.  There was no evidence for hydronephrosis or hydroureter. There  were bilateral probable renal cysts. There was an enlarged  prostate gland with indentation of the urinary bladder base. There  was a small right hydrocele.  4. Metastatic bone survey from 06/19/2012 showed lytic lesions in the  calvaria and right hemipelvis and a possible lesion in the right  humerus. This latter lesion was not specifically mentioned in the  report.  5. Nuclear bone scan on 06/19/2012 showed some increased uptake  involving the femur and humerus bilaterally, possibly reflecting  changes of metabolic bone disease. There was a lack of correlation  with the bone scan and the findings seen on CT and x-rays, raising  the possibility of multiple myeloma.  6. Renal ultrasound on 06/19/2012 showed increased echogenicity of the  renal parenchyma bilaterally consistent with renal medical disease.  There was no evidence for obstruction. There was a slightly  enlarged prostate gland.  7. CT scan of the right humerus without IV contrast on 06/20/2012  showed 2.3-cm lucent lesion with probable mild endosteal thinning  anteriorly near the bicipital groove. There were several small  lytic lesions present within the humeral head, scapula, glenoid,  and proximal radius. No pathologic fracture or soft tissue mass  was seen. There were a few small nodules within the visualized  right lung, the largest of which was 4 mm in the upper lobe on  image 78.    PROCEDURES:  1. CT-guided bone marrow aspirate and biopsy was carried out on  06/24/2012 and showed 48% plasma cells.  2. Bone marrow aspirate and biopsy was carried out on 12/04/2012 and showed infiltration with 24% plasma cells.     IMPRESSION AND PLAN:  Clinically, Trevor Michael  continues to do well.  I am concerned about his rising IgA level.  The last time I spoke with Dr. Barbaraann Boys, which was on 12/24/2012, we talked about adding Revlimid to the treatment program and deleting Cytoxan.  It seems that Dr. Barbaraann Boys may have changed her mind and has decided to go ahead with the high-dose therapy and stem cell transplant, rather than  making alterations in Trevor Michael's chemotherapy.  As stated, he was seen at [facility] yesterday, 01/01/2013.  They have already given him a calender for planned treatment.  He is to receive Neupogen here in [place] on 01/16/2013 and 01/17/2013 before he goes down to [facility].  For today, we will go ahead with planned chemotherapy with Velcade 3.0 mg subcutaneously, Cytoxan 600 mg IV, Decadron 40 mg IV, and Zometa 4 mg IV.  The patient will return tomorrow for Neulasta 6 mg subcu.  I have given Trevor Michael an appointment to see [place] again around 02/13/2013, at which time we will check CBC, chemistries, including an LDH, IgA level, and serum light chains.  Trevor Michael will probably be in [place] for most of the month of June.    ______________________________ Samul Dada, M.D. DSM/MEDQ  D:  01/02/2013  T:  01/03/2013  Job:  [id number]

## 2013-01-06 LAB — PROTEIN ELECTROPHORESIS, SERUM
Alpha-1-Globulin: 4.1 % (ref 2.9–4.9)
Gamma Globulin: 5 % — ABNORMAL LOW (ref 11.1–18.8)
M-Spike, %: 2.24 g/dL

## 2013-01-06 LAB — KAPPA/LAMBDA LIGHT CHAINS
Kappa:Lambda Ratio: 0 — ABNORMAL LOW (ref 0.26–1.65)
Lambda Free Lght Chn: 65.7 mg/dL — ABNORMAL HIGH (ref 0.57–2.63)

## 2013-01-14 ENCOUNTER — Telehealth: Payer: Self-pay | Admitting: *Deleted

## 2013-01-14 ENCOUNTER — Other Ambulatory Visit: Payer: Self-pay | Admitting: Medical Oncology

## 2013-01-14 ENCOUNTER — Telehealth: Payer: Self-pay | Admitting: Medical Oncology

## 2013-01-14 ENCOUNTER — Other Ambulatory Visit: Payer: Self-pay | Admitting: Oncology

## 2013-01-14 NOTE — Telephone Encounter (Signed)
Lm gv appts for inj scheduled on 01/16/13@ 10:15, and 01/17/13@ 8:15am....td

## 2013-01-14 NOTE — Telephone Encounter (Signed)
Vinnie Langton called from [facility] asking if we can do pt's neupogen shots for 5/30,5/31 and 6/01. I called her back and informed her that we can do all days except 6/01. Pt is to return to [facility] 6/02 but I did let her know that pt can come down on 6/01. I asked her to call me back if any questions or problems.

## 2013-01-15 ENCOUNTER — Other Ambulatory Visit: Payer: Self-pay | Admitting: Oncology

## 2013-01-16 ENCOUNTER — Ambulatory Visit (HOSPITAL_BASED_OUTPATIENT_CLINIC_OR_DEPARTMENT_OTHER): Payer: Medicare Other

## 2013-01-16 VITALS — BP 154/75 | HR 60 | Temp 97.4°F | Resp 20

## 2013-01-16 DIAGNOSIS — C9000 Multiple myeloma not having achieved remission: Secondary | ICD-10-CM

## 2013-01-16 DIAGNOSIS — Z5189 Encounter for other specified aftercare: Secondary | ICD-10-CM

## 2013-01-16 MED ORDER — FILGRASTIM 480 MCG/0.8ML IJ SOLN
780.0000 ug | Freq: Once | INTRAMUSCULAR | Status: AC
  Administered 2013-01-16: 780 ug via SUBCUTANEOUS
  Filled 2013-01-16: qty 1.6

## 2013-01-17 ENCOUNTER — Other Ambulatory Visit: Payer: Self-pay | Admitting: Oncology

## 2013-01-17 ENCOUNTER — Ambulatory Visit (HOSPITAL_BASED_OUTPATIENT_CLINIC_OR_DEPARTMENT_OTHER): Payer: Medicare Other

## 2013-01-17 DIAGNOSIS — C9000 Multiple myeloma not having achieved remission: Secondary | ICD-10-CM

## 2013-01-17 DIAGNOSIS — Z5189 Encounter for other specified aftercare: Secondary | ICD-10-CM

## 2013-01-17 MED ORDER — FILGRASTIM 480 MCG/0.8ML IJ SOLN
780.0000 ug | Freq: Once | INTRAMUSCULAR | Status: AC
  Administered 2013-01-17: 780 ug via SUBCUTANEOUS

## 2013-01-22 ENCOUNTER — Telehealth: Payer: Self-pay

## 2013-01-22 ENCOUNTER — Telehealth: Payer: Self-pay | Admitting: *Deleted

## 2013-01-22 ENCOUNTER — Other Ambulatory Visit: Payer: Self-pay

## 2013-01-22 ENCOUNTER — Other Ambulatory Visit: Payer: Self-pay | Admitting: Oncology

## 2013-01-22 DIAGNOSIS — C9000 Multiple myeloma not having achieved remission: Secondary | ICD-10-CM

## 2013-01-22 NOTE — Telephone Encounter (Signed)
Received vm call from pt stating that he needed to have something done tomorrow per [facility].  Returned call & pt reports needing a hickman dsg change & flush tomorrow.  Informed to come at 1:45 pm tomorrow per Michelle/Scheduler.

## 2013-01-22 NOTE — Telephone Encounter (Signed)
Pt home from [facility] for the weekend, states everything is going well. States that [facility] wants him to get a hickman dressing change tomorrow and not wait until Monday at [facility] and message sent to scheduler. Pt stated he will call in AM for appt time.

## 2013-01-23 ENCOUNTER — Ambulatory Visit (HOSPITAL_BASED_OUTPATIENT_CLINIC_OR_DEPARTMENT_OTHER): Payer: Medicare Other

## 2013-01-23 ENCOUNTER — Other Ambulatory Visit: Payer: Self-pay | Admitting: Oncology

## 2013-01-23 ENCOUNTER — Telehealth: Payer: Self-pay | Admitting: Oncology

## 2013-01-23 VITALS — BP 158/83 | HR 70 | Temp 98.0°F

## 2013-01-23 DIAGNOSIS — Z452 Encounter for adjustment and management of vascular access device: Secondary | ICD-10-CM

## 2013-01-23 DIAGNOSIS — C9000 Multiple myeloma not having achieved remission: Secondary | ICD-10-CM

## 2013-01-23 MED ORDER — SODIUM CHLORIDE 0.9 % IJ SOLN
10.0000 mL | INTRAMUSCULAR | Status: DC | PRN
  Filled 2013-01-23: qty 10

## 2013-01-23 MED ORDER — SODIUM CHLORIDE 0.9 % IJ SOLN
10.0000 mL | INTRAMUSCULAR | Status: AC | PRN
  Administered 2013-01-23: 10 mL
  Filled 2013-01-23: qty 10

## 2013-01-23 MED ORDER — HEPARIN SOD (PORK) LOCK FLUSH 100 UNIT/ML IV SOLN
250.0000 [IU] | INTRAVENOUS | Status: AC | PRN
  Administered 2013-01-23: 250 [IU]
  Filled 2013-01-23: qty 5

## 2013-01-23 MED ORDER — HEPARIN SOD (PORK) LOCK FLUSH 100 UNIT/ML IV SOLN
500.0000 [IU] | INTRAVENOUS | Status: DC | PRN
  Filled 2013-01-23: qty 5

## 2013-01-23 NOTE — Progress Notes (Signed)
1453-Right hickman gauze dressing intact with moderate amount of bloody drainage.  Site cleansed by sterile technique and new sterile gauze dressing applied.  Pt will return on 01/24/13 to have dressing checked by RN's in infusion.

## 2013-01-23 NOTE — Telephone Encounter (Signed)
added pt flush appt per 6.5.14 pof so charge will be correctr.

## 2013-02-13 ENCOUNTER — Other Ambulatory Visit: Payer: Medicare Other | Admitting: Lab

## 2013-02-13 ENCOUNTER — Ambulatory Visit: Payer: Medicare Other | Admitting: Oncology

## 2013-02-13 ENCOUNTER — Telehealth: Payer: Self-pay

## 2013-02-13 NOTE — Telephone Encounter (Signed)
S/w pt. He is at [facility] and states everything is going well. He may be released as soon as Monday if all continues well.

## 2013-02-17 ENCOUNTER — Other Ambulatory Visit: Payer: Self-pay

## 2013-02-17 DIAGNOSIS — C9000 Multiple myeloma not having achieved remission: Secondary | ICD-10-CM

## 2013-02-18 ENCOUNTER — Other Ambulatory Visit (HOSPITAL_BASED_OUTPATIENT_CLINIC_OR_DEPARTMENT_OTHER): Payer: Medicare Other

## 2013-02-18 ENCOUNTER — Encounter (HOSPITAL_COMMUNITY)
Admission: RE | Admit: 2013-02-18 | Discharge: 2013-02-18 | Disposition: A | Discharge status: Home / Self Care | Payer: Medicare Other | Source: Ambulatory Visit | Attending: Oncology | Admitting: Oncology

## 2013-02-18 ENCOUNTER — Other Ambulatory Visit: Payer: Self-pay | Admitting: Internal Medicine

## 2013-02-18 DIAGNOSIS — C9000 Multiple myeloma not having achieved remission: Secondary | ICD-10-CM

## 2013-02-18 LAB — COMPREHENSIVE METABOLIC PANEL (CC13)
ALT: 20 U/L (ref 0–55)
AST: 23 U/L (ref 5–34)
Albumin: 3.2 g/dL — ABNORMAL LOW (ref 3.5–5.0)
Calcium: 8.9 mg/dL (ref 8.4–10.4)
Chloride: 106 mEq/L (ref 98–109)
Potassium: 3.8 mEq/L (ref 3.5–5.1)
Sodium: 140 mEq/L (ref 136–145)

## 2013-02-18 LAB — CBC WITH DIFFERENTIAL/PLATELET
BASO%: 0.1 % (ref 0.0–2.0)
EOS%: 0 % (ref 0.0–7.0)
HCT: 26.9 % — ABNORMAL LOW (ref 38.4–49.9)
HGB: 9.2 g/dL — ABNORMAL LOW (ref 13.0–17.1)
MCHC: 34.2 g/dL (ref 32.0–36.0)
MONO#: 1.2 10*3/uL — ABNORMAL HIGH (ref 0.1–0.9)
NEUT%: 75.8 % — ABNORMAL HIGH (ref 39.0–75.0)
RDW: 14.7 % — ABNORMAL HIGH (ref 11.0–14.6)
WBC: 6.8 10*3/uL (ref 4.0–10.3)
lymph#: 0.4 10*3/uL — ABNORMAL LOW (ref 0.9–3.3)

## 2013-02-18 LAB — HOLD TUBE, BLOOD BANK

## 2013-02-19 ENCOUNTER — Encounter: Payer: Self-pay | Admitting: Oncology

## 2013-02-19 ENCOUNTER — Other Ambulatory Visit: Payer: Self-pay | Admitting: Medical Oncology

## 2013-02-19 ENCOUNTER — Ambulatory Visit (HOSPITAL_BASED_OUTPATIENT_CLINIC_OR_DEPARTMENT_OTHER): Payer: Medicare Other | Admitting: Oncology

## 2013-02-19 VITALS — BP 144/81 | HR 96 | Temp 97.7°F | Resp 20 | Ht 69.0 in | Wt 153.8 lb

## 2013-02-19 DIAGNOSIS — C9000 Multiple myeloma not having achieved remission: Secondary | ICD-10-CM

## 2013-02-19 NOTE — Progress Notes (Signed)
This office note has been dictated.  #[id number]

## 2013-02-20 NOTE — Progress Notes (Signed)
CC:   Trevor Candle, MD Trevor Gess. Norins, MD Trevor Michael, M.D. Trevor Michael, Ph.D., M.D. Trevor Michael, M.D. Trevor Hawks Elnoria Howard, MD Trevor Fuller, MD  PROBLEM LIST:  1. IgA lambda multiple myeloma diagnosed in late October 2013. The patient  has anemia, thrombocytopenia, multiple bone lesions, renal  insufficiency, and monoclonal proteins in the serum and urine. Bone  marrow carried out on 06/24/2012 yielded 48% plasma cells by morphology.  Cytogenetic analysis revealed the presence of normal male chromosomes  with no observable clonal chromosomal abnormalities. There was a single  cell with additional chromosome material on 3q, loss of chromosome 13  and a marker chromosome. DNA probe panel disclosed a gain of chromosome  11 which is consistent with abnormalities associated with multiple  myeloma. A 24-hour urine collection on 06/27/2012 yielded 17.1 g of  protein of which 16.9 g was free lambda light chains. Beta 2  microglobulin was 5.8. Albumin 3.2. IgG level on 06/19/2012 was 6320.  Myeloma is ISS stage III.   Chemotherapy with Velcade, Cytoxan, and Decadron was started on  07/01/2012. Radiation treatments were administered to the right humerus 25 Gy in 10 fractions from 07/10/2012 through 07/28/2012. Neulasta was started on 08/16/2012 because of leukopenia and neutropenia. Zometa was started on 09/11/2012 after the patient had received dental clearance. Doses of the current chemotherapy program utilizing subcutaneous Velcade, IV Cytoxan, and IV Decadron were increased on 11/06/2012. These treatments were concluded on 01/02/2013.  Bone marrow aspirate and biopsy was carried out on 12/04/2012 and showed  infiltration with 24% plasma cells. Cytogenetic analysis revealed the  presence of 2 clonal cell lines. The first cell line was chromosomally  normal (25%). The 2nd cell line was cytogenetically abnormal, missing a  Y chromosome. A single cell with an extra  chromosome 11, 14, 15, and  22, with an 11;14 translocation was observed. FISH analysis showed a  gain of chromosome 11 in 15% of the cells.   The patient underwent high-dose melphalan 140 mg/sq m on 01/27/2013, followed by autologous stem cell transplant on the following day.  The patient had received Neupogen preceding these treatments in order to harvest stem cells.  The patient did extremely well with the exception of some nausea and vomiting.  He really had no complications nor did he require hospitalization.  He received platelet transfusions on a couple of occasions, also 1 or 2 doses of Neupogen.  He apparently had no fever, infections or bleeding problems.  2. Renal insufficiency. Creatinine clearance on 06/27/2012 was 68  mL/min.  3. A 2.3 cm lytic lesion in the right humerus at risk for pathologic  fracture. Radiation treatments were administered to the right humerus 25 Gy in 10 fractions from 07/10/2012 through 07/28/2012.  4. Hypertension.  5. Benign prostate hypertrophy and elevated prostate specific antigen,  under the care of Dr. [name]. The patient had a prostate  biopsy in 2008 that was benign.  6. Dyslipidemia.  7. Arthritis involving the cervical spine  8. Glaucoma.   MEDICATIONS:  Reviewed and recorded. Current Outpatient Prescriptions  Medication Sig Dispense Refill  . acetaminophen (TYLENOL) 500 MG tablet Take 1,000 mg by mouth every 6 (six) hours as needed. For pain      . acyclovir (ZOVIRAX) 400 MG tablet TAKE 1 TABLET (400 MG TOTAL) BY MOUTH 2 (TWO) TIMES DAILY.  60 tablet  6  . acyclovir (ZOVIRAX) 400 MG tablet TAKE 1 TABLET (400 MG TOTAL) BY MOUTH 2 (TWO) TIMES  DAILY.  60 tablet  6  . allopurinol (ZYLOPRIM) 100 MG tablet Take 100 mg by mouth daily.      . bimatoprost (LUMIGAN) 0.03 % ophthalmic drops Place 1 drop into both eyes at bedtime.        . brimonidine-timolol (COMBIGAN) 0.2-0.5 % ophthalmic solution Place 1 drop into both eyes 2 (two) times  daily.        Marland Kitchen esomeprazole (NEXIUM) 40 MG capsule Take 40 mg by mouth daily before breakfast.      . KLOR-CON M20 20 MEQ tablet TAKE 1 TABLET (20 MEQ TOTAL) BY MOUTH DAILY.  30 tablet  3  . loratadine (CLARITIN) 10 MG tablet Take 10 mg by mouth daily as needed for allergies.      Marland Kitchen ondansetron (ZOFRAN) 8 MG tablet Take 8 mg by mouth every 8 (eight) hours as needed for nausea.      . prochlorperazine (COMPAZINE) 10 MG tablet Take 10 mg by mouth every 6 (six) hours as needed (nausea).      Marland Kitchen amLODipine (NORVASC) 5 MG tablet TAKE 1 TABLET BY MOUTH EVERY DAY  30 tablet  4  . atenolol-chlorthalidone (TENORETIC) 50-25 MG per tablet Take 1 tablet by mouth every morning.       No current facility-administered medications for this visit.    TREATMENT PROGRAM: The patient is not on any treatment at this time.   IMMUNIZATIONS:  1. Pneumovax was administered on 07/07/2012.  2. Flu shot was given in November 2013.    SMOKING HISTORY: The patient has never smoked cigarettes.     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  Trevor Michael was last seen by [place] on 01/02/2013.  He is accompanied today by his wife.  It will be recalled that Trevor Michael received subcutaneous Velcade, IV Cytoxan and Decadron from 07/01/2012 through 01/02/2013.  He also received Zometa IV from 09/11/2012 through 01/02/2013.  The patient had Neupogen priming to obtain stem cells in late May and then on 01/27/2013 received high-dose melphalan 140 mg/sq m, followed by autologous stem cell transplant on 01/28/2013.  As noted above, the patient did quite well.  He really had no major complications nor did he require any hospitalizations.  He is here today for followup, having been released from the [facility] outpatient facility on July 1st.  The patient really denies any problems.  He has lost some weight, but his  appetite is improving.  Energy is still low but also improving.  He has had no fever, infections, diarrhea.  In fact, he has some constipation.  He is without pain and in general feels quite well.  He did require some platelet transfusions following his transplant but says he did not require any red cells.  PHYSICAL EXAMINATION:  He looks well in general.  Vital Signs:  Weight is 153 pounds 12.8 ounces, as compared with 161 pounds on 01/02/2013. Height 5 feet 9 inches.  Body surface area 1.84 sq m.  Blood pressure 144/81.  Other vital signs are normal.  O2 saturation on room air was 98%.  HEENT:  There is no scleral icterus.  Mouth and pharynx are benign.  No thrush or ulcers.  There is some blotchy pigmentation change over the nose.  Lymph Nodes:  No peripheral adenopathy palpable.  Heart and Lungs:  Normal.  There is no Port-A-Cath or central catheter. Abdomen:  Benign with  no organomegaly or masses palpable.  Extremities: Notable for trace to 1+ edema of the left ankle.  The patient thinks that he may have turned his ankle a few days ago.  No ecchymoses. Neurologic Exam:  Normal.  LABORATORY DATA:  From 02/18/2013, white count 6.8, ANC 5.2, hemoglobin 9.2, hematocrit 26.9, platelets 63,000.  Chemistries from 02/18/2013 were normal except for an albumin of 3.2, total protein was 6.5, and globulins were therefore 3.3.  Magnesium was 2.0.  Back on 01/02/2013, IgA level was 3070, lambda light chains were 65.70, and the M spike was 2.24.  IMAGING STUDIES:  1. 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 There is foraminal  encroachment at this level, left greater than right. There were  degenerative changes at other levels which were described in detail  in the body of the report.  2. Chest x-ray, 2 view, from 11/16/2009 showed no acute findings.  3. CT  abdomen and pelvis without IV contrast on 06/18/2012 showed  multiple lytic bone lesions highly suspicious for metastatic  disease or multiple myeloma. The largest lesion seen with some  cortical destruction in the anterior aspect of the iliac bone  measured 3 cm. There was no evidence of colitis or diverticulitis.  There was no evidence for hydronephrosis or hydroureter. There  were bilateral probable renal cysts. There was an enlarged  prostate gland with indentation of the urinary bladder base. There  was a small right hydrocele.  4. Metastatic bone survey from 06/19/2012 showed lytic lesions in the  calvaria and right hemipelvis and a possible lesion in the right  humerus. This latter lesion was not specifically mentioned in the  report.  5. Nuclear bone scan on 06/19/2012 showed some increased uptake  involving the femur and humerus bilaterally, possibly reflecting  changes of metabolic bone disease. There was a lack of correlation  with the bone scan and the findings seen on CT and x-rays, raising  the possibility of multiple myeloma.  6. Renal ultrasound on 06/19/2012 showed increased echogenicity of the  renal parenchyma bilaterally consistent with renal medical disease.  There was no evidence for obstruction. There was a slightly  enlarged prostate gland.  7. CT scan of the right humerus without IV contrast on 06/20/2012  showed 2.3-cm lucent lesion with probable mild endosteal thinning  anteriorly near the bicipital groove. There were several small  lytic lesions present within the humeral head, scapula, glenoid,  and proximal radius. No pathologic fracture or soft tissue mass  was seen. There were a few small nodules within the visualized  right lung, the largest of which was 4 mm in the upper lobe on  image 78.    PROCEDURES:  1. CT-guided bone marrow aspirate and biopsy was carried out on  06/24/2012 and showed 48% plasma cells.  2. Bone marrow aspirate and biopsy was  carried out on 12/04/2012 and showed  infiltration with 24% plasma cells.    IMPRESSION AND PLAN:  Trevor Michael is doing extremely well at this time. It looks like his blood counts are recovering.  He is without complaints or problems today.  He is still being very cautious with infections, wearing a mask when he is out in public.  The plan is for [place] to be checking the following lab work every week, which will be on Wednesdays. We will be checking CBC and diff,  chemistries and magnesium.  The patient has an appointment to see Dr. Barbaraann Boys at [facility] on July 29th.  I have asked him to return for an MD appointment here and lab work on August 8th.  Lab work will consist of CBC, chemistries, LDH, magnesium, quantitative immunoglobulins, serum protein electrophoresis, serum immunofixation electrophoresis, serum light chains and beta 2 microglobulin.  Records from [facility] were reviewed.    ______________________________ Samul Dada, M.D. DSM/MEDQ  D:  02/19/2013  T:  02/20/2013  Job:  [id number]

## 2013-02-23 ENCOUNTER — Telehealth: Payer: Self-pay | Admitting: Oncology

## 2013-02-25 ENCOUNTER — Telehealth: Payer: Self-pay | Admitting: Medical Oncology

## 2013-02-25 ENCOUNTER — Other Ambulatory Visit (HOSPITAL_BASED_OUTPATIENT_CLINIC_OR_DEPARTMENT_OTHER): Payer: Medicare Other | Admitting: Lab

## 2013-02-25 DIAGNOSIS — C9000 Multiple myeloma not having achieved remission: Secondary | ICD-10-CM

## 2013-02-25 LAB — COMPREHENSIVE METABOLIC PANEL (CC13)
ALT: 16 U/L (ref 0–55)
BUN: 9.1 mg/dL (ref 7.0–26.0)
CO2: 28 mEq/L (ref 22–29)
Calcium: 9 mg/dL (ref 8.4–10.4)
Creatinine: 0.7 mg/dL (ref 0.7–1.3)
Glucose: 102 mg/dl (ref 70–140)
Total Bilirubin: 0.28 mg/dL (ref 0.20–1.20)

## 2013-02-25 LAB — CBC WITH DIFFERENTIAL/PLATELET
Basophils Absolute: 0 10*3/uL (ref 0.0–0.1)
EOS%: 2 % (ref 0.0–7.0)
LYMPH%: 47.8 % (ref 14.0–49.0)
MCH: 32.3 pg (ref 27.2–33.4)
MCV: 94.3 fL (ref 79.3–98.0)
MONO%: 28.1 % — ABNORMAL HIGH (ref 0.0–14.0)
Platelets: 150 10*3/uL (ref 140–400)
RBC: 3 10*6/uL — ABNORMAL LOW (ref 4.20–5.82)
RDW: 14.7 % — ABNORMAL HIGH (ref 11.0–14.6)

## 2013-02-25 LAB — HOLD TUBE, BLOOD BANK

## 2013-02-25 LAB — LACTATE DEHYDROGENASE (CC13): LDH: 182 U/L (ref 125–245)

## 2013-02-25 NOTE — Telephone Encounter (Signed)
Pt here for post transplant labs. His WBC and ANC have decreased to to 2.0 and 0.4. I called Alberteen Sam, NP at [facility] and she would like for pt to restart his cipro and fluconazole. Pt already has medications and knows how to take them. Pt voiced understanding and he aware of his lab appt next week.  Labs will be faxed to [facility] when resulted.

## 2013-02-26 LAB — IGA: IgA: 1150 mg/dL — ABNORMAL HIGH (ref 68–379)

## 2013-02-26 LAB — KAPPA/LAMBDA LIGHT CHAINS
Kappa free light chain: 0.03 mg/dL — ABNORMAL LOW (ref 0.33–1.94)
Kappa:Lambda Ratio: 0 — ABNORMAL LOW (ref 0.26–1.65)
Lambda Free Lght Chn: 14.7 mg/dL — ABNORMAL HIGH (ref 0.57–2.63)

## 2013-03-04 ENCOUNTER — Ambulatory Visit: Payer: Medicare Other

## 2013-03-04 ENCOUNTER — Other Ambulatory Visit (HOSPITAL_BASED_OUTPATIENT_CLINIC_OR_DEPARTMENT_OTHER): Payer: Medicare Other | Admitting: Lab

## 2013-03-04 DIAGNOSIS — C9000 Multiple myeloma not having achieved remission: Secondary | ICD-10-CM

## 2013-03-04 LAB — CBC WITH DIFFERENTIAL/PLATELET
BASO%: 0.9 % (ref 0.0–2.0)
HCT: 29.4 % — ABNORMAL LOW (ref 38.4–49.9)
LYMPH%: 28.3 % (ref 14.0–49.0)
MCH: 33.5 pg — ABNORMAL HIGH (ref 27.2–33.4)
MCHC: 34.5 g/dL (ref 32.0–36.0)
MCV: 96.9 fL (ref 79.3–98.0)
MONO%: 31.4 % — ABNORMAL HIGH (ref 0.0–14.0)
NEUT%: 38.4 % — ABNORMAL LOW (ref 39.0–75.0)
Platelets: 227 10*3/uL (ref 140–400)
RBC: 3.03 10*6/uL — ABNORMAL LOW (ref 4.20–5.82)

## 2013-03-04 LAB — COMPREHENSIVE METABOLIC PANEL (CC13)
AST: 18 U/L (ref 5–34)
Albumin: 3.3 g/dL — ABNORMAL LOW (ref 3.5–5.0)
Alkaline Phosphatase: 57 U/L (ref 40–150)
Chloride: 107 mEq/L (ref 98–109)
Glucose: 95 mg/dl (ref 70–140)
Potassium: 4.1 mEq/L (ref 3.5–5.1)
Sodium: 142 mEq/L (ref 136–145)
Total Protein: 6.7 g/dL (ref 6.4–8.3)

## 2013-03-04 LAB — HOLD TUBE, BLOOD BANK

## 2013-03-04 NOTE — Progress Notes (Signed)
Treatment not needed.

## 2013-03-10 ENCOUNTER — Encounter: Payer: Self-pay | Admitting: Internal Medicine

## 2013-03-10 ENCOUNTER — Ambulatory Visit (INDEPENDENT_AMBULATORY_CARE_PROVIDER_SITE_OTHER): Payer: Medicare Other | Admitting: Internal Medicine

## 2013-03-10 VITALS — BP 168/80 | HR 73 | Temp 97.7°F | Wt 148.8 lb

## 2013-03-10 DIAGNOSIS — N400 Enlarged prostate without lower urinary tract symptoms: Secondary | ICD-10-CM

## 2013-03-10 DIAGNOSIS — I10 Essential (primary) hypertension: Secondary | ICD-10-CM

## 2013-03-10 DIAGNOSIS — C9000 Multiple myeloma not having achieved remission: Secondary | ICD-10-CM

## 2013-03-10 NOTE — Patient Instructions (Addendum)
It is so very good to see you. It has been an arduous journey but you are doing well. All the reports are very encouraging.  In regard to blood pressure: for now it is OK. You can monitor your BP at home and forward readings to me periodically using MyChart and your medications can be adjusted as needed without you having to risk exposure by coming to the office.  In regard to cholesterol - there is no harm in holding the Zocor until we know that all liver functions are stable at which time that medication can be resumed.  Please contact me if I can be of help to you in any way.

## 2013-03-10 NOTE — Progress Notes (Signed)
Subjective:    Patient ID: Trevor Michael, male    DOB: [birth year], 72 y.o.   MRN: [id number]  HPI Follow up for general medicine: he has been under the care of Dr. Arline Asp for multiple myeloma and has had chemotherapy. He  recently completed BM transplant at [facility]. By his report he has done well. He has not had any intercurrent infections, his appetite is improving, he has lost about 14 lbs but hopes to gain it back soon. He is monitored on a regular basis at [facility] for blood counts and is seen regularly at [facility].   He follows with Urology for elevated PSA and has been stable. He is otherwise medically stable. His spirits are very positive.  Past Medical History  Diagnosis Date  . Other and unspecified hyperlipidemia   . Essential hypertension, benign   . BPH (benign prostatic hyperplasia)   . Early stage glaucoma   . Heart murmur     "since my teens" (06/18/2012)  . Cervical stenosis of spine   . GERD (gastroesophageal reflux disease)   . Bone cancer     multiple bone lesion/right humerus  . Spinal stenosis   . Chronic kidney disease     renal insufficiency  . Arthritis   . DDD (degenerative disc disease)     c-5=-c6, and spondylosis  . Cancer 05/2012    IgA lambda multiple myeloma  . Anorexia 06/25/12    1 month hx   . Diverticulitis   . History of chemotherapy 07/01/12    Velcade,Cytoxin and decadron   Past Surgical History  Procedure Laterality Date  . Lipoma excision  07/1986    left shoulder/scapula  . Esophagogastroduodenoscopy  06/20/2012    Procedure: ESOPHAGOGASTRODUODENOSCOPY (EGD);  Surgeon: Theda Belfast, MD;  Location: [facility] ENDOSCOPY;  Service: Endoscopy;  Laterality: N/A;  . Prostate biopsy  Jan 10, 2007    benign   Family History  Problem Relation Age of Onset  . Benign prostatic hyperplasia Father   . Pneumonia Father   . Coronary artery disease Father     PTCA  . Heart disease Brother     CABG, CAD,  . Benign prostatic hyperplasia Brother    History    Social History  . Marital Status: Married    Spouse Name: N/A    Number of Children: N/A  . Years of Education: 58   Occupational History  . businessman     semi-retired   Social History Main Topics  . Smoking status: Never Smoker   . Smokeless tobacco: Never Used  . Alcohol Use: No  . Drug Use: No  . Sexually Active: Not Currently -- Male partner(s)   Other Topics Concern  . Not on file   Social History Narrative   [employer] BS, MBA [employer].  Married Jan 09, 1962. 1 daughter '64-deceased 2023/05/09 sepsis.  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 Marriage in good health. Playing golf. Doing well overwell.     Current Outpatient Prescriptions on File Prior to Visit  Medication Sig Dispense Refill  . acetaminophen (TYLENOL) 500 MG tablet Take 1,000 mg by mouth every 6 (six) hours as needed. For pain      . acyclovir (ZOVIRAX) 400 MG tablet TAKE 1 TABLET (400 MG TOTAL) BY MOUTH 2 (TWO) TIMES DAILY.  60 tablet  6  . acyclovir (ZOVIRAX) 400  MG tablet TAKE 1 TABLET (400 MG TOTAL) BY MOUTH 2 (TWO) TIMES DAILY.  60 tablet  6  . allopurinol (ZYLOPRIM) 100 MG tablet Take 100 mg by mouth daily.      Marland Kitchen atenolol-chlorthalidone (TENORETIC) 50-25 MG per tablet Take 1 tablet by mouth every morning.      . bimatoprost (LUMIGAN) 0.03 % ophthalmic drops Place 1 drop into both eyes at bedtime.        . brimonidine-timolol (COMBIGAN) 0.2-0.5 % ophthalmic solution Place 1 drop into both eyes 2 (two) times daily.        Marland Kitchen esomeprazole (NEXIUM) 40 MG capsule Take 40 mg by mouth daily before breakfast.      . KLOR-CON M20 20 MEQ tablet TAKE 1 TABLET (20 MEQ TOTAL) BY MOUTH DAILY.  30 tablet  3  . loratadine (CLARITIN) 10 MG tablet Take 10 mg by mouth daily as needed for allergies.      Marland Kitchen ondansetron  (ZOFRAN) 8 MG tablet Take 8 mg by mouth every 8 (eight) hours as needed for nausea.      . prochlorperazine (COMPAZINE) 10 MG tablet Take 10 mg by mouth every 6 (six) hours as needed (nausea).       No current facility-administered medications on file prior to visit.      Review of Systems System review is negative for any constitutional, cardiac, pulmonary, GI or neuro symptoms or complaints other than as described in the HPI.     Objective:   Physical Exam Filed Vitals:   03/10/13 1313  BP: 168/80  Pulse: 73  Temp: 97.7 F (36.5 C)   Wt Readings from Last 3 Encounters:  03/10/13 148 lb 12.8 oz (67.495 kg)  02/19/13 153 lb 12.8 oz (69.763 kg)  01/02/13 161 lb 4.8 oz (73.165 kg)   Gen'l- thin but well looking man in no distress HEENT - C&S clear Cor - RRR Pulm - unlabored and normal respirations Neuro - A&O, cognition is normal, gait and strength are normal       Assessment & Plan:

## 2013-03-11 ENCOUNTER — Telehealth: Payer: Self-pay

## 2013-03-11 ENCOUNTER — Other Ambulatory Visit (HOSPITAL_BASED_OUTPATIENT_CLINIC_OR_DEPARTMENT_OTHER): Payer: Medicare Other

## 2013-03-11 DIAGNOSIS — C9000 Multiple myeloma not having achieved remission: Secondary | ICD-10-CM

## 2013-03-11 LAB — COMPREHENSIVE METABOLIC PANEL (CC13)
AST: 21 U/L (ref 5–34)
Alkaline Phosphatase: 70 U/L (ref 40–150)
BUN: 11.8 mg/dL (ref 7.0–26.0)
Calcium: 9.7 mg/dL (ref 8.4–10.4)
Creatinine: 0.8 mg/dL (ref 0.7–1.3)

## 2013-03-11 LAB — HOLD TUBE, BLOOD BANK

## 2013-03-11 LAB — CBC WITH DIFFERENTIAL/PLATELET
BASO%: 1.2 % (ref 0.0–2.0)
EOS%: 2.6 % (ref 0.0–7.0)
LYMPH%: 29.1 % (ref 14.0–49.0)
MCHC: 33.7 g/dL (ref 32.0–36.0)
MCV: 97.5 fL (ref 79.3–98.0)
MONO%: 33.7 % — ABNORMAL HIGH (ref 0.0–14.0)
Platelets: 199 10*3/uL (ref 140–400)
RBC: 3.55 10*6/uL — ABNORMAL LOW (ref 4.20–5.82)

## 2013-03-11 NOTE — Telephone Encounter (Signed)
Mr Harty here for weekly counts S/P BMT at [facility]. Temp =98.8 f He is  Feeling fine. Will continue with cipro and fluconazole prophylaxis. ANC 0.7 Faxed labs to [facility] attention [employer].   Scheduled labs for 03-18-13 as appointment at [facility] r/s from 03-17-13 to 03-24-13.  Gave pt. Appointment calendar.

## 2013-03-11 NOTE — Assessment & Plan Note (Signed)
Trevor Michael has done well with aggressive treatment. He continues with the [facility]- Dr. Arline Asp and at [facility]. He is feeling well, good appetite and is slowly increasing his physical activity but still curtailing his civic involvement.

## 2013-03-11 NOTE — Assessment & Plan Note (Signed)
BP Readings from Last 3 Encounters:  03/10/13 168/80  02/19/13 144/81  01/23/13 158/83   BP was running low so amlodipine was put on hold. BP has been coming back up.  Plan Continue to hold Amlodipine  Monitor BP at home and report back via MyChart - when BP is remaining elevated w/o drops will resume amlodipine.

## 2013-03-11 NOTE — Assessment & Plan Note (Signed)
Followed by Dr. Isabel Caprice. He is stable

## 2013-03-18 ENCOUNTER — Telehealth: Payer: Self-pay

## 2013-03-18 ENCOUNTER — Other Ambulatory Visit (HOSPITAL_BASED_OUTPATIENT_CLINIC_OR_DEPARTMENT_OTHER): Payer: Medicare Other | Admitting: Lab

## 2013-03-18 DIAGNOSIS — C9000 Multiple myeloma not having achieved remission: Secondary | ICD-10-CM

## 2013-03-18 LAB — CBC WITH DIFFERENTIAL/PLATELET
EOS%: 1.7 % (ref 0.0–7.0)
MCH: 33.4 pg (ref 27.2–33.4)
MCHC: 34.5 g/dL (ref 32.0–36.0)
MCV: 96.8 fL (ref 79.3–98.0)
MONO%: 26.4 % — ABNORMAL HIGH (ref 0.0–14.0)
RBC: 3.61 10*6/uL — ABNORMAL LOW (ref 4.20–5.82)
RDW: 15.8 % — ABNORMAL HIGH (ref 11.0–14.6)

## 2013-03-18 LAB — COMPREHENSIVE METABOLIC PANEL (CC13)
AST: 23 U/L (ref 5–34)
Albumin: 3.8 g/dL (ref 3.5–5.0)
Alkaline Phosphatase: 72 U/L (ref 40–150)
Potassium: 3.8 mEq/L (ref 3.5–5.1)
Sodium: 144 mEq/L (ref 136–145)
Total Bilirubin: 0.35 mg/dL (ref 0.20–1.20)
Total Protein: 7.3 g/dL (ref 6.4–8.3)

## 2013-03-18 NOTE — Telephone Encounter (Signed)
Faxed CBC/LDH/CMP/Mg to [facility].

## 2013-03-20 LAB — SPEP & IFE WITH QIG
Albumin ELP: 57.1 % (ref 55.8–66.1)
Alpha-1-Globulin: 5.6 % — ABNORMAL HIGH (ref 2.9–4.9)
Beta 2: 16.2 % — ABNORMAL HIGH (ref 3.2–6.5)
Beta Globulin: 7.2 % (ref 4.7–7.2)
IgM, Serum: 5 mg/dL — ABNORMAL LOW (ref 41–251)
Total Protein, Serum Electrophoresis: 7.1 g/dL (ref 6.0–8.3)

## 2013-03-20 LAB — BETA 2 MICROGLOBULIN, SERUM: Beta-2 Microglobulin: 1.92 mg/L — ABNORMAL HIGH (ref 1.01–1.73)

## 2013-03-20 LAB — KAPPA/LAMBDA LIGHT CHAINS: Lambda Free Lght Chn: 20.9 mg/dL — ABNORMAL HIGH (ref 0.57–2.63)

## 2013-03-24 ENCOUNTER — Telehealth: Payer: Self-pay

## 2013-03-24 NOTE — Telephone Encounter (Signed)
Mr. Hehl called stating that he went to [facility] today and he does not need to keep appointment tomorrow with Dr. Karel Jarvis per [facility].  The records from today's visit is going to be sent to Dr. Eli Phillips and an appointment can be rescheduled in ~10 days.

## 2013-03-25 ENCOUNTER — Telehealth: Payer: Self-pay | Admitting: Medical Oncology

## 2013-03-25 ENCOUNTER — Other Ambulatory Visit: Payer: Medicare Other | Admitting: Lab

## 2013-03-25 ENCOUNTER — Ambulatory Visit: Payer: Medicare Other

## 2013-03-25 NOTE — Telephone Encounter (Signed)
Pt called and states he went to [facility] this week to see Dr. Barbaraann Boys. She suggested that we cancel his appointment today and move out about 2 weeks. They are going to call him with his lab results when they are available. I asked him to call me after [facility] gives him the results and we will make him an appointment. He voiced understanding.

## 2013-04-01 ENCOUNTER — Other Ambulatory Visit: Payer: Self-pay | Admitting: Medical Oncology

## 2013-04-01 ENCOUNTER — Encounter: Payer: Self-pay | Admitting: Medical Oncology

## 2013-04-01 ENCOUNTER — Telehealth: Payer: Self-pay | Admitting: Medical Oncology

## 2013-04-01 DIAGNOSIS — C9000 Multiple myeloma not having achieved remission: Secondary | ICD-10-CM

## 2013-04-01 NOTE — Telephone Encounter (Signed)
Pt called and states that he has not heard from [facility] regarding his labs 03/24/13 and about when he will need to see the MD here. I called to [facility] and spoke with Marvia Pickles Lin,NP. She states that pt will be seen there 04/24/13. She asked if we can check labs next week. I made POF and pt is aware he will be called with appointment.

## 2013-04-03 ENCOUNTER — Telehealth: Payer: Self-pay | Admitting: Hematology and Oncology

## 2013-04-03 NOTE — Telephone Encounter (Signed)
, °

## 2013-04-08 ENCOUNTER — Other Ambulatory Visit (HOSPITAL_BASED_OUTPATIENT_CLINIC_OR_DEPARTMENT_OTHER): Payer: Medicare Other | Admitting: Lab

## 2013-04-08 ENCOUNTER — Telehealth: Payer: Self-pay

## 2013-04-08 DIAGNOSIS — C9000 Multiple myeloma not having achieved remission: Secondary | ICD-10-CM

## 2013-04-08 LAB — COMPREHENSIVE METABOLIC PANEL (CC13)
AST: 25 U/L (ref 5–34)
Albumin: 3.8 g/dL (ref 3.5–5.0)
Alkaline Phosphatase: 82 U/L (ref 40–150)
BUN: 13.2 mg/dL (ref 7.0–26.0)
Potassium: 4.1 mEq/L (ref 3.5–5.1)
Sodium: 142 mEq/L (ref 136–145)

## 2013-04-08 LAB — CBC WITH DIFFERENTIAL/PLATELET
Basophils Absolute: 0 10*3/uL (ref 0.0–0.1)
EOS%: 1 % (ref 0.0–7.0)
MCH: 32.5 pg (ref 27.2–33.4)
MCV: 94.9 fL (ref 79.3–98.0)
MONO%: 25.7 % — ABNORMAL HIGH (ref 0.0–14.0)
RBC: 3.97 10*6/uL — ABNORMAL LOW (ref 4.20–5.82)
RDW: 14.9 % — ABNORMAL HIGH (ref 11.0–14.6)

## 2013-04-08 NOTE — Telephone Encounter (Signed)
Faxed 8/20 labs to [facility]

## 2013-04-10 ENCOUNTER — Other Ambulatory Visit: Payer: Self-pay | Admitting: Oncology

## 2013-04-10 DIAGNOSIS — C9000 Multiple myeloma not having achieved remission: Secondary | ICD-10-CM

## 2013-04-13 ENCOUNTER — Other Ambulatory Visit: Payer: Self-pay | Admitting: Oncology

## 2013-04-24 ENCOUNTER — Telehealth: Payer: Self-pay | Admitting: Medical Oncology

## 2013-04-24 ENCOUNTER — Other Ambulatory Visit: Payer: Self-pay | Admitting: Medical Oncology

## 2013-04-24 DIAGNOSIS — C9000 Multiple myeloma not having achieved remission: Secondary | ICD-10-CM

## 2013-04-24 NOTE — Telephone Encounter (Signed)
Pt called and saw Dr. Barbaraann Boys this week. He is doing well but will need to have some labs checked next week and then 2 weeks after that. He goes back to Dr. Barbaraann Boys 05/28/13. POF made and pt is aware he will be called with times.

## 2013-04-27 ENCOUNTER — Telehealth: Payer: Self-pay | Admitting: Internal Medicine

## 2013-04-27 NOTE — Telephone Encounter (Signed)
S/w the pt and he is aware of his lab appts in sept.

## 2013-04-29 ENCOUNTER — Other Ambulatory Visit: Payer: Self-pay | Admitting: Internal Medicine

## 2013-04-30 ENCOUNTER — Other Ambulatory Visit (HOSPITAL_BASED_OUTPATIENT_CLINIC_OR_DEPARTMENT_OTHER): Payer: Medicare Other | Admitting: Lab

## 2013-04-30 ENCOUNTER — Telehealth: Payer: Self-pay

## 2013-04-30 DIAGNOSIS — C9000 Multiple myeloma not having achieved remission: Secondary | ICD-10-CM

## 2013-04-30 LAB — CBC WITH DIFFERENTIAL/PLATELET
Basophils Absolute: 0 10*3/uL (ref 0.0–0.1)
EOS%: 0.8 % (ref 0.0–7.0)
Eosinophils Absolute: 0 10*3/uL (ref 0.0–0.5)
HCT: 37.5 % — ABNORMAL LOW (ref 38.4–49.9)
HGB: 12.8 g/dL — ABNORMAL LOW (ref 13.0–17.1)
LYMPH%: 29.4 % (ref 14.0–49.0)
MCH: 32 pg (ref 27.2–33.4)
MCV: 93.4 fL (ref 79.3–98.0)
MONO%: 24.8 % — ABNORMAL HIGH (ref 0.0–14.0)
NEUT#: 1 10*3/uL — ABNORMAL LOW (ref 1.5–6.5)
NEUT%: 44.7 % (ref 39.0–75.0)
Platelets: 136 10*3/uL — ABNORMAL LOW (ref 140–400)
RDW: 14 % (ref 11.0–14.6)

## 2013-04-30 LAB — COMPREHENSIVE METABOLIC PANEL (CC13)
AST: 23 U/L (ref 5–34)
Albumin: 3.7 g/dL (ref 3.5–5.0)
Alkaline Phosphatase: 73 U/L (ref 40–150)
BUN: 14.7 mg/dL (ref 7.0–26.0)
Creatinine: 0.8 mg/dL (ref 0.7–1.3)
Glucose: 77 mg/dl (ref 70–140)
Potassium: 3.5 mEq/L (ref 3.5–5.1)

## 2013-04-30 NOTE — Telephone Encounter (Signed)
Faxed cbc, c-met , and magnesium results to [facility] as requested. Mr. Hawker  ANC is slightly up to 1.0.  He is Afebrile and following neutopenic precautions.  He states that he is not having any problems to note today.

## 2013-05-04 ENCOUNTER — Other Ambulatory Visit: Payer: Self-pay | Admitting: Internal Medicine

## 2013-05-14 ENCOUNTER — Other Ambulatory Visit (HOSPITAL_BASED_OUTPATIENT_CLINIC_OR_DEPARTMENT_OTHER): Payer: Medicare Other | Admitting: Lab

## 2013-05-14 DIAGNOSIS — C9000 Multiple myeloma not having achieved remission: Secondary | ICD-10-CM

## 2013-05-14 LAB — CBC WITH DIFFERENTIAL/PLATELET
Basophils Absolute: 0 10*3/uL (ref 0.0–0.1)
Eosinophils Absolute: 0 10*3/uL (ref 0.0–0.5)
HGB: 13 g/dL (ref 13.0–17.1)
MONO#: 0.6 10*3/uL (ref 0.1–0.9)
NEUT#: 1.1 10*3/uL — ABNORMAL LOW (ref 1.5–6.5)
Platelets: 133 10*3/uL — ABNORMAL LOW (ref 140–400)
RBC: 4.09 10*6/uL — ABNORMAL LOW (ref 4.20–5.82)
RDW: 14 % (ref 11.0–14.6)
WBC: 2.4 10*3/uL — ABNORMAL LOW (ref 4.0–10.3)

## 2013-05-14 LAB — COMPREHENSIVE METABOLIC PANEL (CC13)
Albumin: 3.8 g/dL (ref 3.5–5.0)
BUN: 11.9 mg/dL (ref 7.0–26.0)
CO2: 29 mEq/L (ref 22–29)
Calcium: 9.3 mg/dL (ref 8.4–10.4)
Glucose: 89 mg/dl (ref 70–140)
Potassium: 3.8 mEq/L (ref 3.5–5.1)
Sodium: 143 mEq/L (ref 136–145)
Total Protein: 7.3 g/dL (ref 6.4–8.3)

## 2013-05-18 ENCOUNTER — Telehealth: Payer: Self-pay | Admitting: *Deleted

## 2013-05-18 NOTE — Telephone Encounter (Signed)
Patient calling to have lab results from 05/14/13 faxed to [facility]. Confirmed fax # with Vinnie Langton with Dr Donnie Coffin.

## 2013-06-03 ENCOUNTER — Telehealth: Payer: Self-pay

## 2013-06-03 ENCOUNTER — Other Ambulatory Visit: Payer: Self-pay

## 2013-06-03 DIAGNOSIS — C9000 Multiple myeloma not having achieved remission: Secondary | ICD-10-CM

## 2013-06-03 NOTE — Telephone Encounter (Signed)
Pt called this AM asking to have labs done here before his prostate biopsy on Monday. Pt said person at [facility] suggested this. Asked pt who is doing his prostate biopsy on Monday. Dr Isabel Caprice. S/w Dr Ellin Goodie nurse, it is not normal procedure to do CBC before prostate biopsy. S/w PA at [facility] who said his platelets had dropped from 149 to 129 recently. S/w Dr Rosie Fate and he OK'd doing CBC at [facility]. Will need to call pt with appt time.

## 2013-06-04 ENCOUNTER — Other Ambulatory Visit: Payer: Self-pay | Admitting: Medical Oncology

## 2013-06-05 ENCOUNTER — Telehealth: Payer: Self-pay

## 2013-06-05 ENCOUNTER — Other Ambulatory Visit (HOSPITAL_BASED_OUTPATIENT_CLINIC_OR_DEPARTMENT_OTHER): Payer: Medicare Other | Admitting: Lab

## 2013-06-05 DIAGNOSIS — C9000 Multiple myeloma not having achieved remission: Secondary | ICD-10-CM

## 2013-06-05 LAB — CBC WITH DIFFERENTIAL/PLATELET
BASO%: 0.4 % (ref 0.0–2.0)
Eosinophils Absolute: 0.1 10*3/uL (ref 0.0–0.5)
LYMPH%: 41.9 % (ref 14.0–49.0)
MCH: 30.9 pg (ref 27.2–33.4)
MCHC: 34.7 g/dL (ref 32.0–36.0)
MCV: 88.9 fL (ref 79.3–98.0)
MONO#: 0.4 10*3/uL (ref 0.1–0.9)
MONO%: 15.4 % — ABNORMAL HIGH (ref 0.0–14.0)
NEUT#: 1 10*3/uL — ABNORMAL LOW (ref 1.5–6.5)
Platelets: 109 10*3/uL — ABNORMAL LOW (ref 140–400)
RBC: 4.34 10*6/uL (ref 4.20–5.82)
RDW: 13 % (ref 11.0–14.6)
WBC: 2.4 10*3/uL — ABNORMAL LOW (ref 4.0–10.3)
nRBC: 0 % (ref 0–0)

## 2013-06-05 NOTE — Telephone Encounter (Signed)
Cbc from 10/17 faxed to Dr Barbaraann Boys and Dr Isabel Caprice.

## 2013-06-08 ENCOUNTER — Other Ambulatory Visit (HOSPITAL_COMMUNITY): Payer: Self-pay | Admitting: Internal Medicine

## 2013-06-09 ENCOUNTER — Other Ambulatory Visit: Payer: Self-pay | Admitting: *Deleted

## 2013-06-09 DIAGNOSIS — C9000 Multiple myeloma not having achieved remission: Secondary | ICD-10-CM

## 2013-06-09 MED ORDER — POTASSIUM CHLORIDE CRYS ER 20 MEQ PO TBCR: EXTENDED_RELEASE_TABLET | ORAL | Status: DC

## 2013-06-12 ENCOUNTER — Telehealth: Payer: Self-pay

## 2013-06-12 NOTE — Telephone Encounter (Signed)
lvm that pt had called [facility] about Dr Barbaraann Boys wanting [place] to put him on some kind of maintenance medication. Requested correspondence from Dr Barbaraann Boys about this.

## 2013-06-14 ENCOUNTER — Other Ambulatory Visit: Payer: Self-pay | Admitting: Internal Medicine

## 2013-06-15 ENCOUNTER — Telehealth: Payer: Self-pay

## 2013-06-15 NOTE — Telephone Encounter (Signed)
"  JoJo" Liu NP with Dr Barbaraann Boys gave [place] her cell phone and said it was OK if we use this number. Cell # [phone]. She clarified the OV note from 05/28/13. Dr Barbaraann Boys stated she wants maintenance velcade started q2weeks. Note highlighted and forwarded to Dr Rosie Fate.

## 2013-06-15 NOTE — Telephone Encounter (Signed)
error 

## 2013-06-17 ENCOUNTER — Telehealth: Payer: Self-pay | Admitting: Medical Oncology

## 2013-06-17 ENCOUNTER — Other Ambulatory Visit: Payer: Self-pay | Admitting: Internal Medicine

## 2013-06-17 NOTE — Telephone Encounter (Signed)
Pt called asking if we have heard from Dr. Lillia Abed office regarding maintenance velcade. I informed him that we did get a not from the NP regarding this matter. Dr.Chism would like to set him up for labs, MD and velcade this week. I asked him to call me if he does not get a call from scheduling. He voiced understanding.

## 2013-06-19 ENCOUNTER — Telehealth: Payer: Self-pay | Admitting: Internal Medicine

## 2013-06-19 NOTE — Telephone Encounter (Signed)
s.w. pt and advised on 11.18.14 appt....first available for MD...pt ok and aware

## 2013-07-06 ENCOUNTER — Other Ambulatory Visit: Payer: Self-pay | Admitting: Internal Medicine

## 2013-07-06 DIAGNOSIS — C9000 Multiple myeloma not having achieved remission: Secondary | ICD-10-CM

## 2013-07-06 MED ORDER — PROCHLORPERAZINE MALEATE 10 MG PO TABS: 10.0000 mg | ORAL_TABLET | Freq: Four times a day (QID) | ORAL | Status: DC | PRN

## 2013-07-06 MED ORDER — LORAZEPAM 0.5 MG PO TABS: 0.5000 mg | ORAL_TABLET | Freq: Four times a day (QID) | ORAL | Status: DC | PRN

## 2013-07-06 MED ORDER — DEXAMETHASONE 4 MG PO TABS: ORAL_TABLET | ORAL | Status: DC

## 2013-07-06 MED ORDER — ACYCLOVIR 400 MG PO TABS: 400.0000 mg | ORAL_TABLET | Freq: Two times a day (BID) | ORAL | Status: DC

## 2013-07-06 MED ORDER — ONDANSETRON HCL 8 MG PO TABS: ORAL_TABLET | ORAL | Status: DC

## 2013-07-07 ENCOUNTER — Other Ambulatory Visit: Payer: Self-pay

## 2013-07-07 ENCOUNTER — Ambulatory Visit: Payer: Medicare Other

## 2013-07-07 ENCOUNTER — Ambulatory Visit (HOSPITAL_BASED_OUTPATIENT_CLINIC_OR_DEPARTMENT_OTHER): Payer: Medicare Other | Admitting: Internal Medicine

## 2013-07-07 ENCOUNTER — Other Ambulatory Visit (HOSPITAL_BASED_OUTPATIENT_CLINIC_OR_DEPARTMENT_OTHER): Payer: Medicare Other | Admitting: Lab

## 2013-07-07 ENCOUNTER — Ambulatory Visit (HOSPITAL_BASED_OUTPATIENT_CLINIC_OR_DEPARTMENT_OTHER): Payer: Medicare Other

## 2013-07-07 ENCOUNTER — Telehealth: Payer: Self-pay | Admitting: Internal Medicine

## 2013-07-07 ENCOUNTER — Other Ambulatory Visit: Payer: Medicare Other | Admitting: Lab

## 2013-07-07 VITALS — BP 150/79 | HR 68 | Temp 98.0°F | Resp 19 | Ht 69.0 in | Wt 168.3 lb

## 2013-07-07 DIAGNOSIS — Z5112 Encounter for antineoplastic immunotherapy: Secondary | ICD-10-CM

## 2013-07-07 DIAGNOSIS — C9000 Multiple myeloma not having achieved remission: Secondary | ICD-10-CM

## 2013-07-07 LAB — COMPREHENSIVE METABOLIC PANEL (CC13)
AST: 28 U/L (ref 5–34)
Albumin: 4 g/dL (ref 3.5–5.0)
Anion Gap: 12 mEq/L — ABNORMAL HIGH (ref 3–11)
BUN: 13.5 mg/dL (ref 7.0–26.0)
CO2: 29 mEq/L (ref 22–29)
Calcium: 10.3 mg/dL (ref 8.4–10.4)
Chloride: 101 mEq/L (ref 98–109)
Potassium: 3.5 mEq/L (ref 3.5–5.1)

## 2013-07-07 LAB — CBC WITH DIFFERENTIAL/PLATELET
Basophils Absolute: 0 10*3/uL (ref 0.0–0.1)
EOS%: 1.9 % (ref 0.0–7.0)
Eosinophils Absolute: 0.1 10*3/uL (ref 0.0–0.5)
HCT: 40 % (ref 38.4–49.9)
HGB: 13.3 g/dL (ref 13.0–17.1)
MCH: 29.8 pg (ref 27.2–33.4)
MONO#: 0.7 10*3/uL (ref 0.1–0.9)
NEUT#: 1.8 10*3/uL (ref 1.5–6.5)
NEUT%: 49.7 % (ref 39.0–75.0)
RBC: 4.46 10*6/uL (ref 4.20–5.82)
RDW: 14 % (ref 11.0–14.6)
WBC: 3.5 10*3/uL — ABNORMAL LOW (ref 4.0–10.3)
lymph#: 1 10*3/uL (ref 0.9–3.3)

## 2013-07-07 LAB — LACTATE DEHYDROGENASE (CC13): LDH: 215 U/L (ref 125–245)

## 2013-07-07 MED ORDER — PROCHLORPERAZINE MALEATE 10 MG PO TABS: 10.0000 mg | ORAL_TABLET | Freq: Four times a day (QID) | ORAL | Status: DC | PRN

## 2013-07-07 MED ORDER — ONDANSETRON HCL 8 MG PO TABS
ORAL_TABLET | ORAL | Status: AC
  Filled 2013-07-07: qty 1

## 2013-07-07 MED ORDER — DEXAMETHASONE 4 MG PO TABS: ORAL_TABLET | ORAL | Status: DC

## 2013-07-07 MED ORDER — ONDANSETRON HCL 8 MG PO TABS: ORAL_TABLET | ORAL | Status: DC

## 2013-07-07 MED ORDER — BORTEZOMIB CHEMO SQ INJECTION 3.5 MG (2.5MG/ML)
1.3000 mg/m2 | Freq: Once | INTRAMUSCULAR | Status: AC
  Administered 2013-07-07: 2.25 mg via SUBCUTANEOUS
  Filled 2013-07-07: qty 2.25

## 2013-07-07 MED ORDER — ONDANSETRON HCL 8 MG PO TABS
8.0000 mg | ORAL_TABLET | Freq: Once | ORAL | Status: AC
  Administered 2013-07-07: 8 mg via ORAL

## 2013-07-07 NOTE — Telephone Encounter (Signed)
gv and printed appt sched and avs for pt for DEc.....sed added tx. °

## 2013-07-07 NOTE — Patient Instructions (Signed)
[facility] Discharge Instructions for Patients Receiving Chemotherapy  Today you received the following chemotherapy agent: Velcade   To help prevent nausea and vomiting after your treatment, we encourage you to take your nausea medication as prescribed.    If you develop nausea and vomiting that is not controlled by your nausea medication, call the clinic.   BELOW ARE SYMPTOMS THAT SHOULD BE REPORTED IMMEDIATELY:  *FEVER GREATER THAN 100.5 F  *CHILLS WITH OR WITHOUT FEVER  NAUSEA AND VOMITING THAT IS NOT CONTROLLED WITH YOUR NAUSEA MEDICATION  *UNUSUAL SHORTNESS OF BREATH  *UNUSUAL BRUISING OR BLEEDING  TENDERNESS IN MOUTH AND THROAT WITH OR WITHOUT PRESENCE OF ULCERS  *URINARY PROBLEMS  *BOWEL PROBLEMS  UNUSUAL RASH Items with * indicate a potential emergency and should be followed up as soon as possible.  Feel free to call the clinic you have any questions or concerns. The clinic phone number is [phone].    

## 2013-07-07 NOTE — Patient Instructions (Signed)
Neutropenia Neutropenia is a condition that occurs when the level of a certain type of white blood cell (neutrophil) in your body becomes lower than normal. Neutrophils are made in the bone marrow and fight infections. These cells protect against bacteria and viruses. The fewer neutrophils you have, and the longer your body remains without them, the greater your risk of getting a severe infection becomes. CAUSES  The cause of neutropenia may be hard to determine. However, it is usually due to 3 main problems:   Decreased production of neutrophils. This may be due to:  Certain medicines such as chemotherapy.  Genetic problems.  Cancer.  Radiation treatments.  Vitamin deficiency.  Some pesticides.  Increased destruction of neutrophils. This may be due to:  Overwhelming infections.  Hemolytic anemia. This is when the body destroys its own blood cells.  Chemotherapy.  Neutrophils moving to areas of the body where they cannot fight infections. This may be due to:  Dialysis procedures.  Conditions where the spleen becomes enlarged. Neutrophils are held in the spleen and are not available to the rest of the body.  Overwhelming infections. The neutrophils are held in the area of the infection and are not available to the rest of the body. SYMPTOMS  There are no specific symptoms of neutropenia. The lack of neutrophils can result in an infection, and an infection can cause various problems. DIAGNOSIS  Diagnosis is made by a blood test. A complete blood count is performed. The normal level of neutrophils in human blood differs with age and race. Infants have lower counts than older children and adults. African Americans have lower counts than Caucasians or Asians. The average adult level is 1500 cells/mm3 of blood. Neutrophil counts are interpreted as follows:  Greater than 1000 cells/mm3 gives normal protection against infection.  500 to 1000 cells/mm3 gives an increased risk for  infection.  200 to 500 cells/mm3 is a greater risk for severe infection.  Lower than 200 cells/mm3 is a marked risk of infection. This may require hospitalization and treatment with antibiotic medicines. TREATMENT  Treatment depends on the underlying cause, severity, and presence of infections or symptoms. It also depends on your health. Your caregiver will discuss the treatment plan with you. Mild cases are often easily treated and have a good outcome. Preventative measures may also be started to limit your risk of infections. Treatment can include:  Taking antibiotics.  Stopping medicines that are known to cause neutropenia.  Correcting nutritional deficiencies by eating green vegetables to supply folic acid and taking vitamin B supplements.  Stopping exposure to pesticides if your neutropenia is related to pesticide exposure.  Taking a blood growth factor called sargramostim, pegfilgrastim, or filgrastim if you are undergoing chemotherapy for cancer. This stimulates white blood cell production.  Removal of the spleen if you have Felty's syndrome and have repeated infections. HOME CARE INSTRUCTIONS   Follow your caregiver's instructions about when you need to have blood work done.  Wash your hands often. Make sure others who come in contact with you also wash their hands.  Wash raw fruits and vegetables before eating them. They can carry bacteria and fungi.  Avoid people with colds or spreadable (contagious) diseases (chickenpox, herpes zoster, influenza).  Avoid large crowds.  Avoid construction areas. The dust can release fungus into the air.  Be cautious around children in daycare or school environments.  Take care of your respiratory system by coughing and deep breathing.  Bathe daily.  Protect your skin from cuts and   burns.  Do not work in the garden or with flowers and plants.  Care for the mouth before and after meals by brushing with a soft toothbrush. If you have  mucositis, do not use mouthwash. Mouthwash contains alcohol and can dry out the mouth even more.  Clean the area between the genitals and the anus (perineal area) after urination and bowel movements. Women need to wipe from front to back.  Use a water soluble lubricant during sexual intercourse and practice good hygiene after. Do not have intercourse if you are severely neutropenic. Check with your caregiver for guidelines.  Exercise daily as tolerated.  Avoid people who were vaccinated with a live vaccine in the past 30 days. You should not receive live vaccines (polio, typhoid).  Do not provide direct care for pets. Avoid animal droppings. Do not clean litter boxes and bird cages.  Do not share food utensils.  Do not use tampons, enemas, or rectal suppositories unless directed by your caregiver.  Use an electric razor to remove hair.  Wash your hands after handling magazines, letters, and newspapers. SEEK IMMEDIATE MEDICAL CARE IF:   You have a fever.  You have chills or start to shake.  You feel nauseous or vomit.  You develop mouth sores.  You develop aches and pains.  You have redness and swelling around open wounds.  Your skin is warm to the touch.  You have pus coming from your wounds.  You develop swollen lymph nodes.  You feel weak or fatigued.  You develop red streaks on the skin. MAKE SURE YOU:  Understand these instructions.  Will watch your condition.  Will get help right away if you are not doing well or get worse. Document Released: 01/26/2002 Document Revised: 10/29/2011 Document Reviewed: 02/23/2011 [facility] Patient Information 2014 [place], [place]. Multiple Myeloma Multiple myeloma is the most common cancer of bone. It is caused by the uncontrolled multiplication of a type of white blood cell in the marrow. This white blood cell is called a plasma cell. This means the bone marrow is overworking producing plasma cells. Soon these overproduced cells  begin to take up room in the marrow that is needed by other cells. This means that there are soon not enough red or white blood cells or platelets. Not enough red cells mean that the person is anemic. There are not enough red blood cells to carry oxygen around the body. There are not enough white blood cells to fight disease. This causes the person with multiple myeloma to not feel well. There is also bone pain through much of the body. SYMPTOMS  Anemia causes fatigue (tiredness) and weakness.  Back pain is common. This is from fractures (break in bones) caused by damage to the bones of the back.  Lack of white blood cells makes infection more likely.  Bleeding is a common problem from lack of the cells (platelets). Platelets help blood clots form. This may show up as bleeding from any place. Commonly this shows up as bleeding from the nose or gums.  Fractures (bone breaks) are more common anywhere. The back and ribs are the most commonly fractured areas. DIAGNOSIS  This tumor is often suggested by blood tests. Often doing a bone marrow sample makes the diagnosis (learning what is wrong). This is a test performed by taking a small sample of bone with a small needle. This bone often comes from the sternum (breast bone). This sample is sent to a pathologist (a specialist in looking at tissue  under a microscope). After looking at the sample under the microscope, the pathologist is able to make a diagnosis of the problem. X-rays may also show boney changes. TREATMENT   Occasionally, anti-cancer medications may be used with multiple myeloma. Your caregiver can discuss this with you.  Medications can also be given to help with the bone pain.  There is no cure for multiple myeloma. Lifestyle changes can add years of quality living. HOME CARE INSTRUCTIONS  Often there is no specific treatment for multiple myeloma. Most of the treatment consists of adjustments in dietary and living activities. Some of  these changes include:  Your dietitian or caregiver helping you with your dietary questions.  Taking iron and vitamins as prescribed by your caregiver.  Eating a well balanced diet.  Staying active, but follow restrictions suggested by your caregiver. Avoiding heavy lifting (more than 10 pounds) and activities that cause increased pain.  Drinking plenty of water.  Using back braces and a cane may help with some of the boney pain. SEEK IMMEDIATE MEDICAL CARE IF:  You develop severe, uncontrolled boney pain.  You or your family notices confusion, problems with decision-making or inability to stay awake.  You notice increased urination or constipation.  You notice problems holding your water or stool.  You have numbness or loss of control of your extremities (arms/hands or legs/feet). Document Released: 05/01/2001 Document Revised: 10/29/2011 Document Reviewed: 08/01/2008 [facility] Patient Information 2014 [place], [place]. Bortezomib injection What is this medicine? BORTEZOMIB (bor TEZ oh mib) is a chemotherapy drug. It slows the growth of cancer cells. This medicine is used to treat multiple myeloma, lymphoma, and other cancers. This medicine may be used for other purposes; ask your health care provider or pharmacist if you have questions. COMMON BRAND NAME(S): Velcade What should I tell my health care provider before I take this medicine? They need to know if you have any of these conditions: -heart disease -irregular heartbeat -liver disease -low blood counts, like low white blood cells, platelets, or hemoglobin -peripheral neuropathy -taking medicine for blood pressure -an unusual or allergic reaction to bortezomib, mannitol, boron, other medicines, foods, dyes, or preservatives -pregnant or trying to get pregnant -breast-feeding How should I use this medicine? This medicine is for injection into a vein or for injection under the skin. It is given by a health care  professional in a hospital or clinic setting. Talk to your pediatrician regarding the use of this medicine in children. Special care may be needed. Overdosage: If you think you have taken too much of this medicine contact a poison control center or emergency room at once. NOTE: This medicine is only for you. Do not share this medicine with others. What if I miss a dose? It is important not to miss your dose. Call your doctor or health care professional if you are unable to keep an appointment. What may interact with this medicine? -medicines for diabetes -medicines to increase blood counts like filgrastim, pegfilgrastim, sargramostim -zalcitabine Talk to your doctor or health care professional before taking any of these medicines: -acetaminophen -aspirin -ibuprofen -ketoprofen -naproxen This list may not describe all possible interactions. Give your health care provider a list of all the medicines, herbs, non-prescription drugs, or dietary supplements you use. Also tell them if you smoke, drink alcohol, or use illegal drugs. Some items may interact with your medicine. What should I watch for while using this medicine? Visit your doctor for checks on your progress. This drug may make you feel generally  unwell. This is not uncommon, as chemotherapy can affect healthy cells as well as cancer cells. Report any side effects. Continue your course of treatment even though you feel ill unless your doctor tells you to stop. You may get drowsy or dizzy. Do not drive, use machinery, or do anything that needs mental alertness until you know how this medicine affects you. Do not stand or sit up quickly, especially if you are an older patient. This reduces the risk of dizzy or fainting spells. In some cases, you may be given additional medicines to help with side effects. Follow all directions for their use. Call your doctor or health care professional for advice if you get a fever, chills or sore throat, or  other symptoms of a cold or flu. Do not treat yourself. This drug decreases your body's ability to fight infections. Try to avoid being around people who are sick. This medicine may increase your risk to bruise or bleed. Call your doctor or health care professional if you notice any unusual bleeding. Be careful brushing and flossing your teeth or using a toothpick because you may get an infection or bleed more easily. If you have any dental work done, tell your dentist you are receiving this medicine. Avoid taking products that contain aspirin, acetaminophen, ibuprofen, naproxen, or ketoprofen unless instructed by your doctor. These medicines may hide a fever. Do not become pregnant while taking this medicine. Women should inform their doctor if they wish to become pregnant or think they might be pregnant. There is a potential for serious side effects to an unborn child. Talk to your health care professional or pharmacist for more information. Do not breast-feed an infant while taking this medicine. You may have vomiting or diarrhea while taking this medicine. Drink water or other fluids as directed. What side effects may I notice from receiving this medicine? Side effects that you should report to your doctor or health care professional as soon as possible: -allergic reactions like skin rash, itching or hives, swelling of the face, lips, or tongue -breathing problems -changes in hearing -changes in vision -fast, irregular heartbeat -feeling faint or lightheaded, falls -pain, tingling, numbness in the hands or feet -seizures -swelling of the ankles, feet, hands -unusual bleeding or bruising -unusually weak or tired -vomiting Side effects that usually do not require medical attention (report to your doctor or health care professional if they continue or are bothersome): -changes in emotions or moods -constipation -diarrhea -loss of appetite -headache -irritation at site where  injected -nausea This list may not describe all possible side effects. Call your doctor for medical advice about side effects. You may report side effects to FDA at 1-800-FDA-1088. Where should I keep my medicine? This drug is given in a hospital or clinic and will not be stored at home. NOTE: This sheet is a summary. It may not cover all possible information. If you have questions about this medicine, talk to your doctor, pharmacist, or health care provider.  2014, [employer]. (2010-09-13 11:42:36)

## 2013-07-07 NOTE — Progress Notes (Signed)
[facility] OFFICE PROGRESS NOTE  [name], MD [address] [place] [address]  DIAGNOSIS: Multiple myeloma - Plan: CBC with Differential in 1 month, CBC with Differential, Comprehensive metabolic panel, Comprehensive metabolic panel, DG Bone Survey Met, Kappa/lambda light chains, SPEP & IFE with QIG  Chief Complaint  Patient presents with  . Multiple Myeloma    CURRENT THERAPY:   S/P Melphalan 140 mg/m2 on 01/27/13 and stem cell transplant on 01/28/13. Maintenance velcade started on 07/07/2013.  INTERVAL HISTORY: Trevor Michael 72 y.o. male with a history of Stage III IgA lambda multiple myeloma  s/p SCT (01/28/2013) here for follow-up.  He was last seen by Dr. Donnie Coffin of [facility] on 05/28/2013, six week post autologous transplant.  At that time, his counts were still recovering and maintenance therapy was placed on hold.  Today, the patient is accompanied by his wife, [name].  He was last seen by Dr. Arline Asp on 02/19/2013. He reports that his energy level is still improving.  His weight is stable. His appetite is good. He denies any fever or chills.  He also denies recent hospitalizations or emergency room visits.   MEDICAL HISTORY: Past Medical History  Diagnosis Date  . Other and unspecified hyperlipidemia   . Essential hypertension, benign   . BPH (benign prostatic hyperplasia)   . Early stage glaucoma   . Heart murmur     "since my teens" (06/18/2012)  . Cervical stenosis of spine   . GERD (gastroesophageal reflux disease)   . Bone cancer     multiple bone lesion/right humerus  . Spinal stenosis   . Chronic kidney disease     renal insufficiency  . Arthritis   . DDD (degenerative disc disease)     c-5=-c6, and spondylosis  . Cancer 05/2012    IgA lambda multiple myeloma  . Anorexia 06/25/12    1 month hx   . Diverticulitis   . History of chemotherapy 07/01/12    Velcade,Cytoxin and decadron    INTERIM HISTORY: has HYPERLIPIDEMIA, MILD;  HYPERTENSION, MILD; BENIGN PROSTATIC HYPERTROPHY; Routine health maintenance; Rhinitis, non-allergic; Multiple myeloma; and Bone cancer on his problem list.    ALLERGIES:  has No Known Allergies.  MEDICATIONS: has a current medication list which includes the following prescription(s): acetaminophen, acyclovir, allopurinol, amlodipine, atenolol-chlorthalidone, bimatoprost, brimonidine-timolol, esomeprazole, lorazepam, potassium chloride sa, simvastatin, dexamethasone, loratadine, ondansetron, and prochlorperazine.  SURGICAL HISTORY:  Past Surgical History  Procedure Laterality Date  . Lipoma excision  07/1986    left shoulder/scapula  . Esophagogastroduodenoscopy  06/20/2012    Procedure: ESOPHAGOGASTRODUODENOSCOPY (EGD);  Surgeon: Theda Belfast, MD;  Location: [facility] ENDOSCOPY;  Service: Endoscopy;  Laterality: N/A;  . Prostate biopsy  2008    benign   PROBLEM LIST:  1. IgA lambda multiple myeloma diagnosed in late October 2013. The patient has anemia, thrombocytopenia, multiple bone lesions, renal insufficiency, and monoclonal proteins in the serum and urine. Bone marrow carried out on 06/24/2012 yielded 48% plasma cells by morphology. Cytogenetic analysis revealed the presence of normal male chromosomes with no observable clonal chromosomal abnormalities. There was a single cell with additional chromosome material on 3q, loss of chromosome 13 and a marker chromosome. DNA probe panel disclosed a gain of chromosome 11 which is consistent with abnormalities associated with multiple myeloma. A 24-hour urine collection on 06/27/2012 yielded 17.1 g of protein of which 16.9 g was free lambda light chains. Beta 2  microglobulin was 5.8. Albumin 3.2. IgG level on 06/19/2012 was  6320. Myeloma is ISS stage III.  Chemotherapy with Velcade, Cytoxan, and Decadron was started on 07/01/2012. Radiation treatments were administered to the right humerus 25 Gy in 10 fractions from 07/10/2012 through 07/28/2012.  Neulasta was started on 08/16/2012 because of leukopenia and neutropenia. Zometa was started on 09/11/2012 after the patient had received dental clearance. Doses of the current chemotherapy program utilizing subcutaneous Velcade, IV Cytoxan, and IV Decadron were increased on 11/06/2012. These treatments were concluded on 01/02/2013. Bone marrow aspirate and biopsy was carried out on 12/04/2012 and showed infiltration with 24% plasma cells. Cytogenetic analysis revealed the  presence of 2 clonal cell lines. The first cell line was chromosomally normal (25%). The 2nd cell line was cytogenetically abnormal, missing a Y chromosome. A single cell with an extra chromosome 11, 14, 15, and 22, with an 11;14 translocation was observed. FISH analysis showed a gain of chromosome 11 in 15% of the cells. The patient underwent high-dose melphalan 140 mg/sq m on 01/27/2013, followed by autologous stem cell transplant on the following day. The patient had received Neupogen preceding these treatments in order to harvest stem cells. The patient did extremely well with the exception  of some nausea and vomiting. He really had no complications nor did he require hospitalization. He received platelet transfusions on a couple of occasions, also 1 or 2 doses of Neupogen. He apparently had no fever, infections or bleeding problems.  04/16/13. SPEP negative. 2. Renal insufficiency. Creatinine clearance on 06/27/2012 was 68 mL/min.  3. A 2.3 cm lytic lesion in the right humerus at risk for pathologic fracture. Radiation treatments were administered to the right humerus 25 Gy in 10 fractions from 07/10/2012 through 07/28/2012.  4. Hypertension.  5. Benign prostate hypertrophy and elevated prostate specific antigen, under the care of Dr. [name]. The patient had a prostate biopsy in 2008 that was benign.  6. Dyslipidemia.  7. Arthritis involving the cervical spine  8. Glaucoma.   REVIEW OF SYSTEMS:   Constitutional: Denies  fevers, chills or abnormal weight loss Eyes: Denies blurriness of vision Ears, nose, mouth, throat, and face: Denies mucositis or sore throat Respiratory: Denies cough, dyspnea or wheezes Cardiovascular: Denies palpitation, chest discomfort or lower extremity swelling Gastrointestinal:  Denies nausea, heartburn or change in bowel habits Skin: Denies abnormal skin rashes Lymphatics: Denies new lymphadenopathy or easy bruising Neurological:Denies numbness, tingling or new weaknesses Behavioral/Psych: Mood is stable, no new changes  All other systems were reviewed with the patient and are negative.  PHYSICAL EXAMINATION: ECOG PERFORMANCE STATUS: 0 - Asymptomatic  Blood pressure 150/79, pulse 68, temperature 98 F (36.7 C), temperature source Oral, resp. rate 19, height 5\' 9"  (1.753 m), weight 168 lb 4.8 oz (76.34 kg), SpO2 100.00%.  GENERAL:alert, no distress and comfortable SKIN: skin color, texture, turgor are normal, no rashes or significant lesions EYES: normal, Conjunctiva are pink and non-injected, sclera clear OROPHARYNX:no exudate, no erythema and lips, buccal mucosa, and tongue normal  NECK: supple, thyroid normal size, non-tender, without nodularity LYMPH:  no palpable lymphadenopathy in the cervical, axillary or supraclavicular LUNGS: clear to auscultation and percussion with normal breathing effort HEART: regular rate & rhythm and no murmurs and no lower extremity edema ABDOMEN:abdomen soft, non-tender and normal bowel sounds Musculoskeletal:no cyanosis of digits and no clubbing  NEURO: alert & oriented x 3 with fluent speech, no focal motor/sensory deficits  LABORATORY DATA: Results for orders placed in visit on 07/07/13 (from the past 48 hour(s))  CBC WITH DIFFERENTIAL  Status: Abnormal   Collection Time    07/07/13 12:28 PM      Result Value Range   WBC 3.5 (*) 4.0 - 10.3 10e3/uL   NEUT# 1.8  1.5 - 6.5 10e3/uL   HGB 13.3  13.0 - 17.1 g/dL   HCT 24.4  01.0 - 27.2  %   Platelets 113 (*) 140 - 400 10e3/uL   MCV 89.8  79.3 - 98.0 fL   MCH 29.8  27.2 - 33.4 pg   MCHC 33.2  32.0 - 36.0 g/dL   RBC 5.36  6.44 - 0.34 10e6/uL   RDW 14.0  11.0 - 14.6 %   lymph# 1.0  0.9 - 3.3 10e3/uL   MONO# 0.7  0.1 - 0.9 10e3/uL   Eosinophils Absolute 0.1  0.0 - 0.5 10e3/uL   Basophils Absolute 0.0  0.0 - 0.1 10e3/uL   NEUT% 49.7  39.0 - 75.0 %   LYMPH% 27.3  14.0 - 49.0 %   MONO% 20.6 (*) 0.0 - 14.0 %   EOS% 1.9  0.0 - 7.0 %   BASO% 0.5  0.0 - 2.0 %  COMPREHENSIVE METABOLIC PANEL (CC13)     Status: Abnormal   Collection Time    07/07/13 12:28 PM      Result Value Range   Sodium 141  136 - 145 mEq/L   Potassium 3.5  3.5 - 5.1 mEq/L   Chloride 101  98 - 109 mEq/L   CO2 29  22 - 29 mEq/L   Glucose 89  70 - 140 mg/dl   BUN 74.2  7.0 - 59.5 mg/dL   Creatinine 0.9  0.7 - 1.3 mg/dL   Total Bilirubin 6.38  0.20 - 1.20 mg/dL   Alkaline Phosphatase 77  40 - 150 U/L   AST 28  5 - 34 U/L   ALT 19  0 - 55 U/L   Total Protein 8.0  6.4 - 8.3 g/dL   Albumin 4.0  3.5 - 5.0 g/dL   Calcium 75.6  8.4 - 43.3 mg/dL   Anion Gap 12 (*) 3 - 11 mEq/L  BETA 2 MICROGLOBULINE, SERUM     Status: Abnormal (Preliminary result)   Collection Time    07/07/13 12:28 PM      Result Value Range   Beta-2 Microglobulin 3.22 (*) 1.01 - 1.73 mg/L  LACTATE DEHYDROGENASE (CC13)     Status: None   Collection Time    07/07/13 12:28 PM      Result Value Range   LDH 215  125 - 245 U/L    Labs:  Lab Results  Component Value Date   WBC 3.5* 07/07/2013   HGB 13.3 07/07/2013   HCT 40.0 07/07/2013   MCV 89.8 07/07/2013   PLT 113* 07/07/2013   NEUTROABS 1.8 07/07/2013      Chemistry      Component Value Date/Time   NA 141 07/07/2013 1228   NA 139 06/23/2012 1425   K 3.5 07/07/2013 1228   K 3.9 06/23/2012 1425   CL 102 12/05/2012 0845   CL 99 06/23/2012 1425   CO2 29 07/07/2013 1228   CO2 29 06/23/2012 1425   BUN 13.5 07/07/2013 1228   BUN 18 06/23/2012 1425   CREATININE 0.9 07/07/2013 1228    CREATININE 1.88* 06/27/2012 1039   CREATININE 2.4* 06/23/2012 1425      Component Value Date/Time   CALCIUM 10.3 07/07/2013 1228   CALCIUM 9.1 06/23/2012 1425   ALKPHOS 77 07/07/2013 1228  ALKPHOS 44 06/23/2012 1425   AST 28 07/07/2013 1228   AST 45* 06/23/2012 1425   ALT 19 07/07/2013 1228   ALT 19 06/23/2012 1425   BILITOT 0.53 07/07/2013 1228   BILITOT 0.5 06/23/2012 1425      CBC:  Recent Labs Lab 07/07/13 1228  WBC 3.5*  NEUTROABS 1.8  HGB 13.3  HCT 40.0  MCV 89.8  PLT 113*   Results for EUCLID, CASSETTA (MRN [id number]) as of 07/09/2013 03:19  Ref. Range 07/07/2013 12:28  Beta-2 Microglobulin Latest Range: 1.01-1.73 mg/L 3.22 (H)   Studies:  No results found.   RADIOGRAPHIC STUDIES: 1. MRI of the cervical spine without IV contrast on 11/07/2009 showed disk degeneration and spondylosis, most severe at C5-C6 where there is mild to moderate spinal stenosis. There is foraminal encroachment at this level, left greater than right. There were degenerative changes at other levels which were described in detail in the body of the report.  2. Chest x-ray, 2 view, from 11/16/2009 showed no acute findings.  3. CT abdomen and pelvis without IV contrast on 06/18/2012 showed multiple lytic bone lesions highly suspicious for metastatic disease or multiple myeloma. The largest lesion seen with some cortical destruction in the anterior aspect of the iliac bone measured 3 cm. There was no evidence of colitis or diverticulitis. There was no evidence for hydronephrosis or hydroureter. There were bilateral probable renal cysts. There was an enlarged prostate gland with indentation of the urinary bladder base. There  was a small right hydrocele.  4. Metastatic bone survey from 06/19/2012 showed lytic lesions in the calvaria and right hemipelvis and a possible lesion in the right humerus. This latter lesion was not specifically mentioned in the report.  5. Nuclear bone scan on 06/19/2012 showed  some increased uptake involving the femur and humerus bilaterally, possibly reflecting changes of metabolic bone disease. There was a lack of correlation with the bone scan and the findings seen on CT and x-rays, raising the possibility of multiple myeloma.  6. Renal ultrasound on 06/19/2012 showed increased echogenicity of the renal parenchyma bilaterally consistent with renal medical disease. There was no evidence for obstruction. There was a slightly enlarged prostate gland.  7. CT scan of the right humerus without IV contrast on 06/20/2012 showed 2.3-cm lucent lesion with probable mild endosteal thinning anteriorly near the bicipital groove. There were several small lytic lesions present within the humeral head, scapula, glenoid, and proximal radius. No pathologic fracture or soft tissue mass was seen. There were a few small nodules within the visualized right lung, the largest of which was 4 mm in the upper lobe on image 78.   PROCEDURES:  1. CT-guided bone marrow aspirate and biopsy was carried out on 06/24/2012 and showed 48% plasma cells.  2. Bone marrow aspirate and biopsy was carried out on 12/04/2012 and showed infiltration with 24% plasma cells.   ASSESSMENT: Trevor Michael 72 y.o. male with a history of Multiple myeloma - Plan: CBC with Differential in 1 month, CBC with Differential, Comprehensive metabolic panel, Comprehensive metabolic panel, DG Bone Survey Met, Kappa/lambda light chains, SPEP & IFE with QIG  PLAN:  1. Stage III IgA lambda multiple myeloma S/p auto SCT. --Mr. Kaps is doing very well. He had no toxicities or side effects and seems to be recovering well. His ANC is 1800. His calcium is 10.3; creatinine is 0.9; Hemoglobin 13.3.  WBC is 3.5.  SPEP is pending.   SPEP at [facility] on 08/29 was negative.   --  We will start maintenance velcade q 14 days today.  He was provided a handout. He understand the benefits include keeping his MM controlled.  The side-effects include but  are not limited to nausea/vomiting, diarrhea, constipation, thrombocytopenia, peripheral neuropathy, insomnia, pain, neutropenia, dizziness, herpes viral infection, hypotension.  He understood the benefits and risks and agrees to proceed with therapy.  He will continue to take acyclovir for HSV prophylaxis.  We will order an annual skeletal survey.    2. Follow-up -- We will schedule a follow-up for CBC, chemistries, SPEP and IFE, Kappa Lambda free light chains. All questions were answered. The patient knows to call the clinic with any problems, questions or concerns. We can certainly see the patient much sooner if necessary.  I spent 15 minutes counseling the patient face to face. The total time spent in the appointment was 25 minutes.    Chariah Bailey, MD 07/09/2013 3:19 AM

## 2013-07-09 LAB — SPEP & IFE WITH QIG
Albumin ELP: 52.7 % — ABNORMAL LOW (ref 55.8–66.1)
Alpha-1-Globulin: 3.1 % (ref 2.9–4.9)
Alpha-2-Globulin: 8.4 % (ref 7.1–11.8)
Beta Globulin: 2.2 % — ABNORMAL LOW (ref 4.7–7.2)
IgG (Immunoglobin G), Serum: 238 mg/dL — ABNORMAL LOW (ref 650–1600)
M-Spike, %: 1.64 g/dL
Total Protein, Serum Electrophoresis: 7.5 g/dL (ref 6.0–8.3)

## 2013-07-09 LAB — BETA 2 MICROGLOBULIN, SERUM: Beta-2 Microglobulin: 3.22 mg/L — ABNORMAL HIGH (ref 1.01–1.73)

## 2013-07-09 LAB — KAPPA/LAMBDA LIGHT CHAINS
Kappa free light chain: 0.03 mg/dL — ABNORMAL LOW (ref 0.33–1.94)
Kappa:Lambda Ratio: 0 — ABNORMAL LOW (ref 0.26–1.65)
Lambda Free Lght Chn: 69.1 mg/dL — ABNORMAL HIGH (ref 0.57–2.63)

## 2013-07-10 ENCOUNTER — Telehealth: Payer: Self-pay | Admitting: Internal Medicine

## 2013-07-10 ENCOUNTER — Telehealth: Payer: Self-pay | Admitting: *Deleted

## 2013-07-10 NOTE — Telephone Encounter (Signed)
Talked to pt and gave him appt for lab and chemo and Bobe survey for December, pt moved appt on 07/21/13  to pm appt, pt awaere of all appt

## 2013-07-10 NOTE — Telephone Encounter (Signed)
Per staff phone call I have moved 12/2 appt to the afternoon,. Advised scheduler to move lab

## 2013-07-13 ENCOUNTER — Other Ambulatory Visit (HOSPITAL_COMMUNITY): Payer: Self-pay | Admitting: Internal Medicine

## 2013-07-17 ENCOUNTER — Encounter: Payer: Self-pay | Admitting: Internal Medicine

## 2013-07-21 ENCOUNTER — Ambulatory Visit (HOSPITAL_COMMUNITY)
Admission: RE | Admit: 2013-07-21 | Discharge: 2013-07-21 | Disposition: A | Discharge status: Home / Self Care | Payer: Medicare Other | Source: Ambulatory Visit | Attending: Internal Medicine | Admitting: Internal Medicine

## 2013-07-21 ENCOUNTER — Other Ambulatory Visit (HOSPITAL_COMMUNITY): Payer: Medicare Other

## 2013-07-21 ENCOUNTER — Ambulatory Visit (HOSPITAL_BASED_OUTPATIENT_CLINIC_OR_DEPARTMENT_OTHER): Payer: Medicare Other

## 2013-07-21 ENCOUNTER — Other Ambulatory Visit (HOSPITAL_BASED_OUTPATIENT_CLINIC_OR_DEPARTMENT_OTHER): Payer: Medicare Other | Admitting: Lab

## 2013-07-21 ENCOUNTER — Encounter: Payer: Self-pay | Admitting: *Deleted

## 2013-07-21 VITALS — BP 136/64 | HR 74 | Temp 97.5°F | Resp 20

## 2013-07-21 DIAGNOSIS — M47814 Spondylosis without myelopathy or radiculopathy, thoracic region: Secondary | ICD-10-CM | POA: Insufficient documentation

## 2013-07-21 DIAGNOSIS — M47812 Spondylosis without myelopathy or radiculopathy, cervical region: Secondary | ICD-10-CM | POA: Insufficient documentation

## 2013-07-21 DIAGNOSIS — C9000 Multiple myeloma not having achieved remission: Secondary | ICD-10-CM

## 2013-07-21 DIAGNOSIS — M47817 Spondylosis without myelopathy or radiculopathy, lumbosacral region: Secondary | ICD-10-CM | POA: Insufficient documentation

## 2013-07-21 DIAGNOSIS — Z5112 Encounter for antineoplastic immunotherapy: Secondary | ICD-10-CM

## 2013-07-21 LAB — CBC WITH DIFFERENTIAL/PLATELET
BASO%: 0.3 % (ref 0.0–2.0)
Basophils Absolute: 0 10*3/uL (ref 0.0–0.1)
Eosinophils Absolute: 0.1 10*3/uL (ref 0.0–0.5)
HCT: 39.1 % (ref 38.4–49.9)
HGB: 12.6 g/dL — ABNORMAL LOW (ref 13.0–17.1)
LYMPH%: 18 % (ref 14.0–49.0)
MCH: 29.8 pg (ref 27.2–33.4)
MCHC: 32.3 g/dL (ref 32.0–36.0)
MCV: 92.3 fL (ref 79.3–98.0)
MONO%: 21 % — ABNORMAL HIGH (ref 0.0–14.0)
NEUT#: 2.7 10*3/uL (ref 1.5–6.5)
NEUT%: 59 % (ref 39.0–75.0)
Platelets: 104 10*3/uL — ABNORMAL LOW (ref 140–400)
RBC: 4.24 10*6/uL (ref 4.20–5.82)
lymph#: 0.8 10*3/uL — ABNORMAL LOW (ref 0.9–3.3)

## 2013-07-21 LAB — COMPREHENSIVE METABOLIC PANEL (CC13)
ALT: 18 U/L (ref 0–55)
Albumin: 3.8 g/dL (ref 3.5–5.0)
Alkaline Phosphatase: 80 U/L (ref 40–150)
BUN: 17.9 mg/dL (ref 7.0–26.0)
Chloride: 101 mEq/L (ref 98–109)
Creatinine: 1.2 mg/dL (ref 0.7–1.3)
Glucose: 106 mg/dl (ref 70–140)
Potassium: 3.8 mEq/L (ref 3.5–5.1)
Total Bilirubin: 0.49 mg/dL (ref 0.20–1.20)

## 2013-07-21 MED ORDER — BORTEZOMIB CHEMO SQ INJECTION 3.5 MG (2.5MG/ML)
1.3000 mg/m2 | Freq: Once | INTRAMUSCULAR | Status: AC
  Administered 2013-07-21: 2.25 mg via SUBCUTANEOUS
  Filled 2013-07-21: qty 2.25

## 2013-07-21 MED ORDER — ONDANSETRON HCL 8 MG PO TABS
ORAL_TABLET | ORAL | Status: AC
  Filled 2013-07-21: qty 1

## 2013-07-21 MED ORDER — ONDANSETRON HCL 8 MG PO TABS
8.0000 mg | ORAL_TABLET | Freq: Once | ORAL | Status: AC
  Administered 2013-07-21: 8 mg via ORAL

## 2013-07-21 NOTE — Progress Notes (Signed)
Chaplain made initial visit. Pt was very friendly and talkative. 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 Pt was only here for quick treatment. Chaplain will follow up as necessary.

## 2013-07-21 NOTE — Patient Instructions (Signed)
[facility] Discharge Instructions for Patients Receiving Chemotherapy  Today you received the following chemotherapy agents Velcade To help prevent nausea and vomiting after your treatment, we encourage you to take your nausea medication as prescribed.   If you develop nausea and vomiting that is not controlled by your nausea medication, call the clinic.   BELOW ARE SYMPTOMS THAT SHOULD BE REPORTED IMMEDIATELY:  *FEVER GREATER THAN 100.5 F  *CHILLS WITH OR WITHOUT FEVER  NAUSEA AND VOMITING THAT IS NOT CONTROLLED WITH YOUR NAUSEA MEDICATION  *UNUSUAL SHORTNESS OF BREATH  *UNUSUAL BRUISING OR BLEEDING  TENDERNESS IN MOUTH AND THROAT WITH OR WITHOUT PRESENCE OF ULCERS  *URINARY PROBLEMS  *BOWEL PROBLEMS  UNUSUAL RASH Items with * indicate a potential emergency and should be followed up as soon as possible.  Feel free to call the clinic should you have any questions or concerns. The clinic phone number is [phone].   Bortezomib injection What is this medicine? BORTEZOMIB (bor TEZ oh mib) is a chemotherapy drug. It slows the growth of cancer cells. This medicine is used to treat multiple myeloma, lymphoma, and other cancers. This medicine may be used for other purposes; ask your health care provider or pharmacist if you have questions. COMMON BRAND NAME(S): Velcade What should I tell my health care provider before I take this medicine? They need to know if you have any of these conditions: -heart disease -irregular heartbeat -liver disease -low blood counts, like low white blood cells, platelets, or hemoglobin -peripheral neuropathy -taking medicine for blood pressure -an unusual or allergic reaction to bortezomib, mannitol, boron, other medicines, foods, dyes, or preservatives -pregnant or trying to get pregnant -breast-feeding How should I use this medicine? This medicine is for injection into a vein or for injection under the skin. It is given by a  health care professional in a hospital or clinic setting. Talk to your pediatrician regarding the use of this medicine in children. Special care may be needed. Overdosage: If you think you have taken too much of this medicine contact a poison control center or emergency room at once. NOTE: This medicine is only for you. Do not share this medicine with others. What if I miss a dose? It is important not to miss your dose. Call your doctor or health care professional if you are unable to keep an appointment. What may interact with this medicine? -medicines for diabetes -medicines to increase blood counts like filgrastim, pegfilgrastim, sargramostim -zalcitabine Talk to your doctor or health care professional before taking any of these medicines: -acetaminophen -aspirin -ibuprofen -ketoprofen -naproxen This list may not describe all possible interactions. Give your health care provider a list of all the medicines, herbs, non-prescription drugs, or dietary supplements you use. Also tell them if you smoke, drink alcohol, or use illegal drugs. Some items may interact with your medicine. What should I watch for while using this medicine? Visit your doctor for checks on your progress. This drug may make you feel generally unwell. This is not uncommon, as chemotherapy can affect healthy cells as well as cancer cells. Report any side effects. Continue your course of treatment even though you feel ill unless your doctor tells you to stop. You may get drowsy or dizzy. Do not drive, use machinery, or do anything that needs mental alertness until you know how this medicine affects you. Do not stand or sit up quickly, especially if you are an older patient. This reduces the risk of dizzy or fainting spells. In  some cases, you may be given additional medicines to help with side effects. Follow all directions for their use. Call your doctor or health care professional for advice if you get a fever, chills or sore  throat, or other symptoms of a cold or flu. Do not treat yourself. This drug decreases your body's ability to fight infections. Try to avoid being around people who are sick. This medicine may increase your risk to bruise or bleed. Call your doctor or health care professional if you notice any unusual bleeding. Be careful brushing and flossing your teeth or using a toothpick because you may get an infection or bleed more easily. If you have any dental work done, tell your dentist you are receiving this medicine. Avoid taking products that contain aspirin, acetaminophen, ibuprofen, naproxen, or ketoprofen unless instructed by your doctor. These medicines may hide a fever. Do not become pregnant while taking this medicine. Women should inform their doctor if they wish to become pregnant or think they might be pregnant. There is a potential for serious side effects to an unborn child. Talk to your health care professional or pharmacist for more information. Do not breast-feed an infant while taking this medicine. You may have vomiting or diarrhea while taking this medicine. Drink water or other fluids as directed. What side effects may I notice from receiving this medicine? Side effects that you should report to your doctor or health care professional as soon as possible: -allergic reactions like skin rash, itching or hives, swelling of the face, lips, or tongue -breathing problems -changes in hearing -changes in vision -fast, irregular heartbeat -feeling faint or lightheaded, falls -pain, tingling, numbness in the hands or feet -seizures -swelling of the ankles, feet, hands -unusual bleeding or bruising -unusually weak or tired -vomiting Side effects that usually do not require medical attention (report to your doctor or health care professional if they continue or are bothersome): -changes in emotions or moods -constipation -diarrhea -loss of appetite -headache -irritation at site where  injected -nausea This list may not describe all possible side effects. Call your doctor for medical advice about side effects. You may report side effects to FDA at 1-800-FDA-1088. Where should I keep my medicine? This drug is given in a hospital or clinic and will not be stored at home. NOTE: This sheet is a summary. It may not cover all possible information. If you have questions about this medicine, talk to your doctor, pharmacist, or health care provider.  2014, [employer]. (2010-09-13 11:42:36)

## 2013-07-28 DIAGNOSIS — C61 Malignant neoplasm of prostate: Secondary | ICD-10-CM

## 2013-07-28 HISTORY — DX: Malignant neoplasm of prostate: C61

## 2013-07-28 HISTORY — PX: PROSTATE BIOPSY: SHX241

## 2013-08-04 ENCOUNTER — Telehealth: Payer: Self-pay | Admitting: *Deleted

## 2013-08-04 ENCOUNTER — Ambulatory Visit (HOSPITAL_BASED_OUTPATIENT_CLINIC_OR_DEPARTMENT_OTHER): Payer: Medicare Other | Admitting: Internal Medicine

## 2013-08-04 ENCOUNTER — Ambulatory Visit (HOSPITAL_BASED_OUTPATIENT_CLINIC_OR_DEPARTMENT_OTHER): Payer: Medicare Other

## 2013-08-04 ENCOUNTER — Telehealth: Payer: Self-pay | Admitting: Internal Medicine

## 2013-08-04 ENCOUNTER — Other Ambulatory Visit (HOSPITAL_BASED_OUTPATIENT_CLINIC_OR_DEPARTMENT_OTHER): Payer: Medicare Other

## 2013-08-04 VITALS — BP 156/70 | HR 73 | Temp 98.9°F | Resp 18 | Ht 69.0 in | Wt 171.3 lb

## 2013-08-04 DIAGNOSIS — C9000 Multiple myeloma not having achieved remission: Secondary | ICD-10-CM

## 2013-08-04 DIAGNOSIS — Z5112 Encounter for antineoplastic immunotherapy: Secondary | ICD-10-CM

## 2013-08-04 LAB — COMPREHENSIVE METABOLIC PANEL (CC13)
ALT: 17 U/L (ref 0–55)
Albumin: 3.7 g/dL (ref 3.5–5.0)
Anion Gap: 12 mEq/L — ABNORMAL HIGH (ref 3–11)
CO2: 29 mEq/L (ref 22–29)
Chloride: 100 mEq/L (ref 98–109)
Glucose: 88 mg/dl (ref 70–140)
Potassium: 3.5 mEq/L (ref 3.5–5.1)
Sodium: 142 mEq/L (ref 136–145)
Total Protein: 8.2 g/dL (ref 6.4–8.3)

## 2013-08-04 LAB — CBC WITH DIFFERENTIAL/PLATELET
Eosinophils Absolute: 0.2 10*3/uL (ref 0.0–0.5)
HGB: 11.9 g/dL — ABNORMAL LOW (ref 13.0–17.1)
LYMPH%: 16 % (ref 14.0–49.0)
MCHC: 32.8 g/dL (ref 32.0–36.0)
MONO#: 1 10*3/uL — ABNORMAL HIGH (ref 0.1–0.9)
NEUT#: 3.3 10*3/uL (ref 1.5–6.5)
Platelets: 124 10*3/uL — ABNORMAL LOW (ref 140–400)
RBC: 3.96 10*6/uL — ABNORMAL LOW (ref 4.20–5.82)
WBC: 5.2 10*3/uL (ref 4.0–10.3)
lymph#: 0.8 10*3/uL — ABNORMAL LOW (ref 0.9–3.3)

## 2013-08-04 MED ORDER — ONDANSETRON HCL 8 MG PO TABS
8.0000 mg | ORAL_TABLET | Freq: Once | ORAL | Status: AC
  Administered 2013-08-04: 8 mg via ORAL

## 2013-08-04 MED ORDER — BORTEZOMIB CHEMO SQ INJECTION 3.5 MG (2.5MG/ML)
1.3000 mg/m2 | Freq: Once | INTRAMUSCULAR | Status: AC
  Administered 2013-08-04: 2.25 mg via SUBCUTANEOUS
  Filled 2013-08-04: qty 2.25

## 2013-08-04 MED ORDER — ONDANSETRON HCL 8 MG PO TABS
ORAL_TABLET | ORAL | Status: AC
  Filled 2013-08-04: qty 1

## 2013-08-04 NOTE — Telephone Encounter (Signed)
appts made per 12/16 POF AVS and CAL given shh °

## 2013-08-04 NOTE — Patient Instructions (Signed)

## 2013-08-04 NOTE — Telephone Encounter (Signed)
Per staff message and POF I have scheduled appts.  JMW  

## 2013-08-04 NOTE — Progress Notes (Signed)
[facility] OFFICE PROGRESS NOTE  [name], MD [address] [place] [address]  DIAGNOSIS: Multiple myeloma - Plan: CBC with Differential, Comprehensive metabolic panel (Cmet) - [facility], CBC with Differential, Comprehensive metabolic panel (Cmet) - [facility], SPEP & IFE with QIG, Kappa/lambda light chains  Chief Complaint  Patient presents with  . Multiple Myeloma    CURRENT THERAPY:   S/P Melphalan 140 mg/m2 on 01/27/13 and stem cell transplant on 01/28/13. Maintenance velcade started on 07/07/2013.  INTERVAL HISTORY: Trevor Michael 72 y.o. 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  He was last seen by Dr. Donnie Coffin of [facility] on 05/28/2013, six week post autologous transplant.  At that time, his counts were still recovering and maintenance therapy was placed on hold.  Today, the patient is accompanied by his wife, Trevor Michael.  He was last seen by me on 07/07/2013. He reports that his energy level is at baseline.  His weight is stable. His appetite is good. He denies any fever or chills.  He also denies recent hospitalizations or emergency room visits.   MEDICAL HISTORY: Past Medical History  Diagnosis Date  . Other and unspecified hyperlipidemia   . Essential hypertension, benign   . BPH (benign prostatic hyperplasia)   . Early stage glaucoma   . Heart murmur     "since my teens" (06/18/2012)  . Cervical stenosis of spine   . GERD (gastroesophageal reflux disease)   . Bone cancer     multiple bone lesion/right humerus  . Spinal stenosis   . Chronic kidney disease     renal insufficiency  . Arthritis   . DDD (degenerative disc disease)     c-5=-c6, and spondylosis  . Cancer 05/2012    IgA lambda multiple myeloma  . Anorexia 06/25/12    1 month hx   . Diverticulitis   . History of chemotherapy 07/01/12    Velcade,Cytoxin and decadron    INTERIM HISTORY: has HYPERLIPIDEMIA, MILD; HYPERTENSION, MILD;  BENIGN PROSTATIC HYPERTROPHY; Routine health maintenance; Rhinitis, non-allergic; Multiple myeloma; and Bone cancer on his problem list.    ALLERGIES:  has No Known Allergies.  MEDICATIONS: has a current medication list which includes the following prescription(s): acetaminophen, acyclovir, allopurinol, amlodipine, atenolol-chlorthalidone, atenolol-chlorthalidone, bimatoprost, brimonidine-timolol, dexamethasone, esomeprazole, loratadine, lorazepam, nexium, ondansetron, potassium chloride sa, prochlorperazine, and simvastatin.  SURGICAL HISTORY:  Past Surgical History  Procedure Laterality Date  . Lipoma excision  07/1986    left shoulder/scapula  . Esophagogastroduodenoscopy  06/20/2012    Procedure: ESOPHAGOGASTRODUODENOSCOPY (EGD);  Surgeon: Theda Belfast, MD;  Location: [facility] ENDOSCOPY;  Service: Endoscopy;  Laterality: N/A;  . Prostate biopsy  2008    benign   PROBLEM LIST:  1. IgA lambda multiple myeloma diagnosed in late October 2013. The patient has anemia, thrombocytopenia, multiple bone lesions, renal insufficiency, and monoclonal proteins in the serum and urine. Bone marrow carried out on 06/24/2012 yielded 48% plasma cells by morphology. Cytogenetic analysis revealed the presence of normal male chromosomes with no observable clonal chromosomal abnormalities. There was a single cell with additional chromosome material on 3q, loss of chromosome 13 and a marker chromosome. DNA probe panel disclosed a gain of chromosome 11 which is consistent with abnormalities associated with multiple myeloma. A 24-hour urine collection on 06/27/2012 yielded 17.1 g of protein of which 16.9 g was free lambda light chains. Beta 2  microglobulin was 5.8. Albumin 3.2. IgG level on 06/19/2012 was 6320.  Myeloma is ISS stage III.  Chemotherapy with Velcade, Cytoxan, and Decadron was started on 07/01/2012. Radiation treatments were administered to the right humerus 25 Gy in 10 fractions from 07/10/2012 through  07/28/2012. Neulasta was started on 08/16/2012 because of leukopenia and neutropenia. Zometa was started on 09/11/2012 after the patient had received dental clearance. Doses of the current chemotherapy program utilizing subcutaneous Velcade, IV Cytoxan, and IV Decadron were increased on 11/06/2012. These treatments were concluded on 01/02/2013. Bone marrow aspirate and biopsy was carried out on 12/04/2012 and showed infiltration with 24% plasma cells. Cytogenetic analysis revealed the  presence of 2 clonal cell lines. The first cell line was chromosomally normal (25%). The 2nd cell line was cytogenetically abnormal, missing a Y chromosome. A single cell with an extra chromosome 11, 14, 15, and 22, with an 11;14 translocation was observed. FISH analysis showed a gain of chromosome 11 in 15% of the cells. The patient underwent high-dose melphalan 140 mg/sq m on 01/27/2013, followed by autologous stem cell transplant on the following day. The patient had received Neupogen preceding these treatments in order to harvest stem cells. The patient did extremely well with the exception  of some nausea and vomiting. He really had no complications nor did he require hospitalization. He received platelet transfusions on a couple of occasions, also 1 or 2 doses of Neupogen. He apparently had no fever, infections or bleeding problems.  04/16/13. SPEP negative. 2. Renal insufficiency. Creatinine clearance on 06/27/2012 was 68 mL/min.  3. A 2.3 cm lytic lesion in the right humerus at risk for pathologic fracture. Radiation treatments were administered to the right humerus 25 Gy in 10 fractions from 07/10/2012 through 07/28/2012.  4. Hypertension.  5. Benign prostate hypertrophy and elevated prostate specific antigen, under the care of Dr. [name]. The patient had a prostate biopsy in 2008 that was benign.  6. Dyslipidemia.  7. Arthritis involving the cervical spine  8. Glaucoma.   REVIEW OF SYSTEMS:   Constitutional:  Denies fevers, chills or abnormal weight loss Eyes: Denies blurriness of vision Ears, nose, mouth, throat, and face: Denies mucositis or sore throat Respiratory: Denies cough, dyspnea or wheezes Cardiovascular: Denies palpitation, chest discomfort or lower extremity swelling Gastrointestinal:  Denies nausea, heartburn or change in bowel habits Skin: Denies abnormal skin rashes Lymphatics: Denies new lymphadenopathy or easy bruising Neurological:Denies numbness, tingling or new weaknesses Behavioral/Psych: Mood is stable, no new changes  All other systems were reviewed with the patient and are negative.  PHYSICAL EXAMINATION: ECOG PERFORMANCE STATUS: 0 - Asymptomatic  Blood pressure 156/70, pulse 73, temperature 98.9 F (37.2 C), temperature source Oral, resp. rate 18, height 5\' 9"  (1.753 m), weight 171 lb 4.8 oz (77.701 kg), SpO2 100.00%.  GENERAL:alert, no distress and comfortable SKIN: skin color, texture, turgor are normal, no rashes or significant lesions EYES: normal, Conjunctiva are pink and non-injected, sclera clear OROPHARYNX:no exudate, no erythema and lips, buccal mucosa, and tongue normal  NECK: supple, thyroid normal size, non-tender, without nodularity LYMPH:  no palpable lymphadenopathy in the cervical, axillary or supraclavicular LUNGS: clear to auscultation and percussion with normal breathing effort HEART: regular rate & rhythm and no murmurs and no lower extremity edema ABDOMEN:abdomen soft, non-tender and normal bowel sounds Musculoskeletal:no cyanosis of digits and no clubbing  NEURO: alert & oriented x 3 with fluent speech, no focal motor/sensory deficits  LABORATORY DATA: Results for orders placed in visit on 08/04/13 (from the past 48 hour(s))  CBC WITH DIFFERENTIAL     Status:  Abnormal   Collection Time    08/04/13 12:36 PM      Result Value Range   WBC 5.2  4.0 - 10.3 10e3/uL   NEUT# 3.3  1.5 - 6.5 10e3/uL   HGB 11.9 (*) 13.0 - 17.1 g/dL   HCT 16.1 (*)  09.6 - 49.9 %   Platelets 124 (*) 140 - 400 10e3/uL   MCV 91.4  79.3 - 98.0 fL   MCH 29.9  27.2 - 33.4 pg   MCHC 32.8  32.0 - 36.0 g/dL   RBC 0.45 (*) 4.09 - 8.11 10e6/uL   RDW 14.2  11.0 - 14.6 %   lymph# 0.8 (*) 0.9 - 3.3 10e3/uL   MONO# 1.0 (*) 0.1 - 0.9 10e3/uL   Eosinophils Absolute 0.2  0.0 - 0.5 10e3/uL   Basophils Absolute 0.0  0.0 - 0.1 10e3/uL   NEUT% 62.0  39.0 - 75.0 %   LYMPH% 16.0  14.0 - 49.0 %   MONO% 18.8 (*) 0.0 - 14.0 %   EOS% 2.9  0.0 - 7.0 %   BASO% 0.3  0.0 - 2.0 %  COMPREHENSIVE METABOLIC PANEL (CC13)     Status: Abnormal   Collection Time    08/04/13 12:36 PM      Result Value Range   Sodium 142  136 - 145 mEq/L   Potassium 3.5  3.5 - 5.1 mEq/L   Chloride 100  98 - 109 mEq/L   CO2 29  22 - 29 mEq/L   Glucose 88  70 - 140 mg/dl   BUN 91.4  7.0 - 78.2 mg/dL   Creatinine 0.8  0.7 - 1.3 mg/dL   Total Bilirubin 9.56  0.20 - 1.20 mg/dL   Alkaline Phosphatase 78  40 - 150 U/L   AST 30  5 - 34 U/L   ALT 17  0 - 55 U/L   Total Protein 8.2  6.4 - 8.3 g/dL   Albumin 3.7  3.5 - 5.0 g/dL   Calcium 21.3  8.4 - 08.6 mg/dL   Anion Gap 12 (*) 3 - 11 mEq/L    Labs:  Lab Results  Component Value Date   WBC 5.2 08/04/2013   HGB 11.9* 08/04/2013   HCT 36.2* 08/04/2013   MCV 91.4 08/04/2013   PLT 124* 08/04/2013   NEUTROABS 3.3 08/04/2013      Chemistry      Component Value Date/Time   NA 142 08/04/2013 1236   NA 139 06/23/2012 1425   K 3.5 08/04/2013 1236   K 3.9 06/23/2012 1425   CL 102 12/05/2012 0845   CL 99 06/23/2012 1425   CO2 29 08/04/2013 1236   CO2 29 06/23/2012 1425   BUN 10.4 08/04/2013 1236   BUN 18 06/23/2012 1425   CREATININE 0.8 08/04/2013 1236   CREATININE 1.88* 06/27/2012 1039   CREATININE 2.4* 06/23/2012 1425      Component Value Date/Time   CALCIUM 10.4 08/04/2013 1236   CALCIUM 9.1 06/23/2012 1425   ALKPHOS 78 08/04/2013 1236   ALKPHOS 44 06/23/2012 1425   AST 30 08/04/2013 1236   AST 45* 06/23/2012 1425   ALT 17 08/04/2013 1236    ALT 19 06/23/2012 1425   BILITOT 0.49 08/04/2013 1236   BILITOT 0.5 06/23/2012 1425      CBC:  Recent Labs Lab 08/04/13 1236  WBC 5.2  NEUTROABS 3.3  HGB 11.9*  HCT 36.2*  MCV 91.4  PLT 124*   Results for Brittian, Daviel K (  MRN [id number]) as of 07/09/2013 03:19  Ref. Range 07/07/2013 12:28  Beta-2 Microglobulin Latest Range: 1.01-1.73 mg/L 3.22 (H)   Results for WISSAM, RESOR (MRN [id number]) as of 08/05/2013 05:58  Ref. Range 07/07/2013 12:28  Beta-2 Microglobulin Latest Range: 1.01-1.73 mg/L 3.22 (H)  Albumin ELP Latest Range: 55.8-66.1 % 52.7 (L)  COMMENT (PROTEIN ELECTROPHOR) No range found *  Alpha-1-Globulin Latest Range: 2.9-4.9 % 3.1  Alpha-2-Globulin Latest Range: 7.1-11.8 % 8.4  Beta Globulin Latest Range: 4.7-7.2 % 2.2 (L)  Beta 2 Latest Range: 3.2-6.5 % 28.9 (H)  Gamma Globulin Latest Range: 11.1-18.8 % 4.7 (L)  M-SPIKE, % No range found 1.64  SPE Interp. No range found *  IgG (Immunoglobin G), Serum Latest Range: 604-268-7962 mg/dL 782 (L)  IgA Latest Range: 68-379 mg/dL 9562 (H)  IgM, Serum Latest Range: 41-251 mg/dL 11 (L)  Total Protein, Serum Electrophoresis Latest Range: 6.0-8.3 g/dL 7.5  Kappa free light chain Latest Range: 0.33-1.94 mg/dL 1.30 (L)  Lambda Free Lght Chn Latest Range: 0.57-2.63 mg/dL 86.57 (H)  Kappa:Lambda Ratio Latest Range: 0.26-1.65  0.00 (L)   RADIOGRAPHIC STUDIES: 1. MRI of the cervical spine without IV contrast on 11/07/2009 showed disk degeneration and spondylosis, most severe at C5-C6 where there is mild to moderate spinal stenosis. There is foraminal encroachment at this level, left greater than right. There were degenerative changes at other levels which were described in detail in the body of the report.  2. Chest x-ray, 2 view, from 11/16/2009 showed no acute findings.  3. CT abdomen and pelvis without IV contrast on 06/18/2012 showed multiple lytic bone lesions highly suspicious for metastatic disease or multiple myeloma. The  largest lesion seen with some cortical destruction in the anterior aspect of the iliac bone measured 3 cm. There was no evidence of colitis or diverticulitis. There was no evidence for hydronephrosis or hydroureter. There were bilateral probable renal cysts. There was an enlarged prostate gland with indentation of the urinary bladder base. There was a small right hydrocele.  4. Metastatic bone survey from 06/19/2012 showed lytic lesions in the calvaria and right hemipelvis and a possible lesion in the right humerus. This latter lesion was not specifically mentioned in the report.  5. Nuclear bone scan on 06/19/2012 showed some increased uptake involving the femur and humerus bilaterally, possibly reflecting changes of metabolic bone disease. There was a lack of correlation with the bone scan and the findings seen on CT and x-rays, raising the possibility of multiple myeloma.  6. Renal ultrasound on 06/19/2012 showed increased echogenicity of the renal parenchyma bilaterally consistent with renal medical disease. There was no evidence for obstruction. There was a slightly enlarged prostate gland.  7. CT scan of the right humerus without IV contrast on 06/20/2012 showed 2.3-cm lucent lesion with probable mild endosteal thinning anteriorly near the bicipital groove. There were several small lytic lesions present within the humeral head, scapula, glenoid, and proximal radius. No pathologic fracture or soft tissue mass was seen. There were a few small nodules within the visualized right lung, the largest of which was 4 mm in the upper lobe on image 78. 8. Metastatic bone survey (07/22/2011) demonstrates multiple new lytic lesions are noted with innumerable lesions noted throughout the skull, left scapula, left humerus, and both femurs. These findings are indicative of progressive myeloma.  PROCEDURES:  1. CT-guided bone marrow aspirate and biopsy was carried out on 06/24/2012 and showed 48% plasma cells.  2.  Bone marrow aspirate and biopsy was carried  out on 12/04/2012 and showed infiltration with 24% plasma cells.   ASSESSMENT: SULEIMAN FINIGAN 72 y.o. male with a history of Multiple myeloma - Plan: CBC with Differential, Comprehensive metabolic panel (Cmet) - [facility], CBC with Differential, Comprehensive metabolic panel (Cmet) - [facility], SPEP & IFE with QIG, Kappa/lambda light chains  PLAN:  1. Stage III IgA lambda multiple myeloma S/p auto SCT. --Mr. Faucett is doing very well. He had no toxicities or side effects and seems to be recovering well. His ANC is 3300. His calcium is 10.4; creatinine is 0.8; Hemoglobin 11.9.  WBC is 5.3.  SPEP is as noted above.   SPEP at [facility] on 08/29 was negative.   --We started maintenance velcade q 14 days today.  He was provided a handout. He understand the benefits include keeping his MM controlled.  The side-effects include but are not limited to nausea/vomiting, diarrhea, constipation, thrombocytopenia, peripheral neuropathy, insomnia, pain, neutropenia, dizziness, herpes viral infection, hypotension.  He understood the benefits and risks and agrees to proceed with therapy.  He will continue to take acyclovir for HSV prophylaxis.   --We  ordered an annual skeletal survey as noted above.   --We discussed restarting zometa next visit.  He was provided a handout.   2. Follow-up -- We will schedule a follow-up for CBC, chemistries, SPEP and IFE, Kappa Lambda free light chains. All questions were answered. The patient knows to call the clinic with any problems, questions or concerns. We can certainly see the patient much sooner if necessary.  I spent 15 minutes counseling the patient face to face. The total time spent in the appointment was 25 minutes.    Renzo Vincelette, MD 08/05/2013 5:55 AM

## 2013-08-07 LAB — SPEP & IFE WITH QIG
Albumin ELP: 48.7 % — ABNORMAL LOW (ref 55.8–66.1)
Alpha-2-Globulin: 9.4 % (ref 7.1–11.8)
Beta Globulin: 2.7 % — ABNORMAL LOW (ref 4.7–7.2)
IgA: 2360 mg/dL — ABNORMAL HIGH (ref 68–379)
IgG (Immunoglobin G), Serum: 206 mg/dL — ABNORMAL LOW (ref 650–1600)
M-Spike, %: 1.97 g/dL
Total Protein, Serum Electrophoresis: 7.8 g/dL (ref 6.0–8.3)

## 2013-08-07 LAB — KAPPA/LAMBDA LIGHT CHAINS: Kappa free light chain: 0.03 mg/dL — ABNORMAL LOW (ref 0.33–1.94)

## 2013-08-18 ENCOUNTER — Ambulatory Visit (HOSPITAL_BASED_OUTPATIENT_CLINIC_OR_DEPARTMENT_OTHER): Payer: Medicare Other

## 2013-08-18 ENCOUNTER — Other Ambulatory Visit (HOSPITAL_BASED_OUTPATIENT_CLINIC_OR_DEPARTMENT_OTHER): Payer: Medicare Other

## 2013-08-18 VITALS — BP 155/66 | HR 72 | Temp 97.7°F

## 2013-08-18 DIAGNOSIS — Z5112 Encounter for antineoplastic immunotherapy: Secondary | ICD-10-CM

## 2013-08-18 DIAGNOSIS — C9000 Multiple myeloma not having achieved remission: Secondary | ICD-10-CM

## 2013-08-18 LAB — CBC WITH DIFFERENTIAL/PLATELET
Basophils Absolute: 0 10*3/uL (ref 0.0–0.1)
EOS%: 2.6 % (ref 0.0–7.0)
Eosinophils Absolute: 0.1 10*3/uL (ref 0.0–0.5)
HCT: 34.5 % — ABNORMAL LOW (ref 38.4–49.9)
HGB: 11.9 g/dL — ABNORMAL LOW (ref 13.0–17.1)
MCH: 31.6 pg (ref 27.2–33.4)
MCV: 91.6 fL (ref 79.3–98.0)
MONO%: 18.4 % — ABNORMAL HIGH (ref 0.0–14.0)
NEUT#: 2.8 10*3/uL (ref 1.5–6.5)
NEUT%: 63.8 % (ref 39.0–75.0)
Platelets: 121 10*3/uL — ABNORMAL LOW (ref 140–400)
RDW: 14.5 % (ref 11.0–14.6)
lymph#: 0.7 10*3/uL — ABNORMAL LOW (ref 0.9–3.3)

## 2013-08-18 LAB — COMPREHENSIVE METABOLIC PANEL (CC13)
Albumin: 3.6 g/dL (ref 3.5–5.0)
Alkaline Phosphatase: 78 U/L (ref 40–150)
BUN: 9.7 mg/dL (ref 7.0–26.0)
Calcium: 10.5 mg/dL — ABNORMAL HIGH (ref 8.4–10.4)
Chloride: 101 mEq/L (ref 98–109)
Creatinine: 1 mg/dL (ref 0.7–1.3)
Glucose: 117 mg/dl (ref 70–140)
Potassium: 3.5 mEq/L (ref 3.5–5.1)
Total Protein: 8.2 g/dL (ref 6.4–8.3)

## 2013-08-18 MED ORDER — ONDANSETRON HCL 8 MG PO TABS
ORAL_TABLET | ORAL | Status: AC
  Filled 2013-08-18: qty 1

## 2013-08-18 MED ORDER — BORTEZOMIB CHEMO SQ INJECTION 3.5 MG (2.5MG/ML)
1.3000 mg/m2 | Freq: Once | INTRAMUSCULAR | Status: AC
  Administered 2013-08-18: 2.25 mg via SUBCUTANEOUS
  Filled 2013-08-18: qty 2.25

## 2013-08-18 MED ORDER — ONDANSETRON HCL 8 MG PO TABS
8.0000 mg | ORAL_TABLET | Freq: Once | ORAL | Status: AC
  Administered 2013-08-18: 8 mg via ORAL

## 2013-08-18 NOTE — Patient Instructions (Signed)
[facility] Discharge Instructions for Patients Receiving Chemotherapy  Today you received the following chemotherapy agents: Velcade.  To help prevent nausea and vomiting after your treatment, we encourage you to take your nausea medication as prescribed.   If you develop nausea and vomiting that is not controlled by your nausea medication, call the clinic.   BELOW ARE SYMPTOMS THAT SHOULD BE REPORTED IMMEDIATELY:  *FEVER GREATER THAN 100.5 F  *CHILLS WITH OR WITHOUT FEVER  NAUSEA AND VOMITING THAT IS NOT CONTROLLED WITH YOUR NAUSEA MEDICATION  *UNUSUAL SHORTNESS OF BREATH  *UNUSUAL BRUISING OR BLEEDING  TENDERNESS IN MOUTH AND THROAT WITH OR WITHOUT PRESENCE OF ULCERS  *URINARY PROBLEMS  *BOWEL PROBLEMS  UNUSUAL RASH Items with * indicate a potential emergency and should be followed up as soon as possible.  Feel free to call the clinic you have any questions or concerns. The clinic phone number is [phone].    

## 2013-08-25 ENCOUNTER — Other Ambulatory Visit (HOSPITAL_BASED_OUTPATIENT_CLINIC_OR_DEPARTMENT_OTHER): Payer: Medicare Other

## 2013-08-25 ENCOUNTER — Other Ambulatory Visit: Payer: Self-pay | Admitting: Internal Medicine

## 2013-08-25 DIAGNOSIS — C9000 Multiple myeloma not having achieved remission: Secondary | ICD-10-CM

## 2013-08-25 LAB — CBC WITH DIFFERENTIAL/PLATELET
BASO%: 0.3 % (ref 0.0–2.0)
Basophils Absolute: 0 10*3/uL (ref 0.0–0.1)
EOS%: 1.9 % (ref 0.0–7.0)
Eosinophils Absolute: 0.1 10*3/uL (ref 0.0–0.5)
HEMATOCRIT: 34.1 % — AB (ref 38.4–49.9)
HGB: 11.6 g/dL — ABNORMAL LOW (ref 13.0–17.1)
LYMPH%: 13.1 % — AB (ref 14.0–49.0)
MCH: 31.2 pg (ref 27.2–33.4)
MCHC: 34.1 g/dL (ref 32.0–36.0)
MCV: 91.4 fL (ref 79.3–98.0)
MONO#: 1.1 10*3/uL — ABNORMAL HIGH (ref 0.1–0.9)
MONO%: 23.6 % — AB (ref 0.0–14.0)
NEUT#: 2.9 10*3/uL (ref 1.5–6.5)
NEUT%: 61.1 % (ref 39.0–75.0)
Platelets: 117 10*3/uL — ABNORMAL LOW (ref 140–400)
RBC: 3.73 10*6/uL — ABNORMAL LOW (ref 4.20–5.82)
RDW: 15 % — ABNORMAL HIGH (ref 11.0–14.6)
WBC: 4.8 10*3/uL (ref 4.0–10.3)
lymph#: 0.6 10*3/uL — ABNORMAL LOW (ref 0.9–3.3)

## 2013-09-01 ENCOUNTER — Other Ambulatory Visit: Payer: Self-pay | Admitting: Internal Medicine

## 2013-09-01 ENCOUNTER — Telehealth: Payer: Self-pay | Admitting: Internal Medicine

## 2013-09-01 ENCOUNTER — Ambulatory Visit (HOSPITAL_BASED_OUTPATIENT_CLINIC_OR_DEPARTMENT_OTHER): Payer: Medicare Other

## 2013-09-01 VITALS — BP 146/73 | HR 77 | Temp 97.0°F | Resp 19

## 2013-09-01 DIAGNOSIS — C9000 Multiple myeloma not having achieved remission: Secondary | ICD-10-CM

## 2013-09-01 DIAGNOSIS — Z5112 Encounter for antineoplastic immunotherapy: Secondary | ICD-10-CM

## 2013-09-01 LAB — COMPREHENSIVE METABOLIC PANEL (CC13)
ALBUMIN: 3.7 g/dL (ref 3.5–5.0)
ALT: 15 U/L (ref 0–55)
AST: 29 U/L (ref 5–34)
Alkaline Phosphatase: 75 U/L (ref 40–150)
Anion Gap: 14 mEq/L — ABNORMAL HIGH (ref 3–11)
BUN: 12.8 mg/dL (ref 7.0–26.0)
CALCIUM: 10.5 mg/dL — AB (ref 8.4–10.4)
CHLORIDE: 103 meq/L (ref 98–109)
CO2: 27 mEq/L (ref 22–29)
Creatinine: 1.2 mg/dL (ref 0.7–1.3)
Glucose: 110 mg/dl (ref 70–140)
Potassium: 3.6 mEq/L (ref 3.5–5.1)
SODIUM: 144 meq/L (ref 136–145)
Total Bilirubin: 0.57 mg/dL (ref 0.20–1.20)
Total Protein: 7.9 g/dL (ref 6.4–8.3)

## 2013-09-01 LAB — CBC WITH DIFFERENTIAL/PLATELET
BASO%: 0.4 % (ref 0.0–2.0)
Basophils Absolute: 0 10*3/uL (ref 0.0–0.1)
EOS%: 1.7 % (ref 0.0–7.0)
Eosinophils Absolute: 0.1 10*3/uL (ref 0.0–0.5)
HCT: 33 % — ABNORMAL LOW (ref 38.4–49.9)
HGB: 11.3 g/dL — ABNORMAL LOW (ref 13.0–17.1)
LYMPH#: 0.7 10*3/uL — AB (ref 0.9–3.3)
LYMPH%: 17.3 % (ref 14.0–49.0)
MCH: 31.4 pg (ref 27.2–33.4)
MCHC: 34.3 g/dL (ref 32.0–36.0)
MCV: 91.6 fL (ref 79.3–98.0)
MONO#: 0.8 10*3/uL (ref 0.1–0.9)
MONO%: 18.6 % — AB (ref 0.0–14.0)
NEUT#: 2.6 10*3/uL (ref 1.5–6.5)
NEUT%: 62 % (ref 39.0–75.0)
Platelets: 113 10*3/uL — ABNORMAL LOW (ref 140–400)
RBC: 3.61 10*6/uL — ABNORMAL LOW (ref 4.20–5.82)
RDW: 15.1 % — ABNORMAL HIGH (ref 11.0–14.6)
WBC: 4.3 10*3/uL (ref 4.0–10.3)

## 2013-09-01 MED ORDER — BORTEZOMIB CHEMO SQ INJECTION 3.5 MG (2.5MG/ML)
2.5000 mg | Freq: Once | INTRAMUSCULAR | Status: AC
  Administered 2013-09-01: 2.5 mg via SUBCUTANEOUS
  Filled 2013-09-01: qty 2.5

## 2013-09-01 MED ORDER — ONDANSETRON HCL 8 MG PO TABS
8.0000 mg | ORAL_TABLET | Freq: Once | ORAL | Status: AC
  Administered 2013-09-01: 8 mg via ORAL

## 2013-09-01 MED ORDER — ONDANSETRON HCL 8 MG PO TABS
ORAL_TABLET | ORAL | Status: AC
  Filled 2013-09-01: qty 1

## 2013-09-01 NOTE — Telephone Encounter (Signed)
pt came by and r/s labs and MD from 1/20 to 1/21

## 2013-09-01 NOTE — Patient Instructions (Signed)
[facility] Discharge Instructions for Patients Receiving Chemotherapy  Today you received the following chemotherapy agents: Velcade  To help prevent nausea and vomiting after your treatment, we encourage you to take your nausea medication: Compazine 10 mg every 6 hrs as needed.    If you develop nausea and vomiting that is not controlled by your nausea medication, call the clinic.   BELOW ARE SYMPTOMS THAT SHOULD BE REPORTED IMMEDIATELY:  *FEVER GREATER THAN 100.5 F  *CHILLS WITH OR WITHOUT FEVER  NAUSEA AND VOMITING THAT IS NOT CONTROLLED WITH YOUR NAUSEA MEDICATION  *UNUSUAL SHORTNESS OF BREATH  *UNUSUAL BRUISING OR BLEEDING  TENDERNESS IN MOUTH AND THROAT WITH OR WITHOUT PRESENCE OF ULCERS  *URINARY PROBLEMS  *BOWEL PROBLEMS  UNUSUAL RASH Items with * indicate a potential emergency and should be followed up as soon as possible.  Feel free to call the clinic you have any questions or concerns. The clinic phone number is [phone].    

## 2013-09-03 LAB — SPEP & IFE WITH QIG
ALBUMIN ELP: 50.4 % — AB (ref 55.8–66.1)
ALPHA-1-GLOBULIN: 4.2 % (ref 2.9–4.9)
Alpha-2-Globulin: 9.9 % (ref 7.1–11.8)
BETA GLOBULIN: 3.3 % — AB (ref 4.7–7.2)
Beta 2: 27.7 % — ABNORMAL HIGH (ref 3.2–6.5)
Gamma Globulin: 4.5 % — ABNORMAL LOW (ref 11.1–18.8)
IGM, SERUM: 11 mg/dL — AB (ref 41–251)
IgA: 1960 mg/dL — ABNORMAL HIGH (ref 68–379)
IgG (Immunoglobin G), Serum: 176 mg/dL — ABNORMAL LOW (ref 650–1600)
M-Spike, %: 1.6 g/dL
Total Protein, Serum Electrophoresis: 7.5 g/dL (ref 6.0–8.3)

## 2013-09-03 LAB — KAPPA/LAMBDA LIGHT CHAINS
KAPPA LAMBDA RATIO: 0 — AB (ref 0.26–1.65)
Kappa free light chain: 0.03 mg/dL — ABNORMAL LOW (ref 0.33–1.94)
Lambda Free Lght Chn: 201 mg/dL — ABNORMAL HIGH (ref 0.57–2.63)

## 2013-09-08 ENCOUNTER — Ambulatory Visit: Payer: Medicare Other

## 2013-09-08 ENCOUNTER — Other Ambulatory Visit: Payer: Medicare Other

## 2013-09-08 ENCOUNTER — Telehealth: Payer: Self-pay | Admitting: Medical Oncology

## 2013-09-08 ENCOUNTER — Other Ambulatory Visit: Payer: Self-pay | Admitting: Medical Oncology

## 2013-09-08 NOTE — Telephone Encounter (Signed)
I called pt to let him know he does not need to have any labs drawn tomorrow. He has labs 09/01/13. Appointment cancelled.

## 2013-09-09 ENCOUNTER — Other Ambulatory Visit: Payer: Medicare Other

## 2013-09-09 ENCOUNTER — Telehealth: Payer: Self-pay | Admitting: *Deleted

## 2013-09-09 ENCOUNTER — Telehealth: Payer: Self-pay | Admitting: Internal Medicine

## 2013-09-09 ENCOUNTER — Ambulatory Visit (HOSPITAL_BASED_OUTPATIENT_CLINIC_OR_DEPARTMENT_OTHER): Payer: Medicare Other | Admitting: Internal Medicine

## 2013-09-09 ENCOUNTER — Other Ambulatory Visit: Payer: Self-pay | Admitting: Internal Medicine

## 2013-09-09 VITALS — BP 141/65 | HR 75 | Temp 97.7°F | Resp 18 | Ht 69.0 in | Wt 168.5 lb

## 2013-09-09 DIAGNOSIS — C9000 Multiple myeloma not having achieved remission: Secondary | ICD-10-CM

## 2013-09-09 DIAGNOSIS — Z9481 Bone marrow transplant status: Secondary | ICD-10-CM

## 2013-09-09 DIAGNOSIS — R972 Elevated prostate specific antigen [PSA]: Secondary | ICD-10-CM

## 2013-09-09 DIAGNOSIS — Z23 Encounter for immunization: Secondary | ICD-10-CM

## 2013-09-09 MED ORDER — PNEUMOCOCCAL 13-VAL CONJ VACC IM SUSP
0.5000 mL | Freq: Once | INTRAMUSCULAR | Status: AC
  Administered 2013-09-09: 0.5 mL via INTRAMUSCULAR
  Filled 2013-09-09: qty 0.5

## 2013-09-09 NOTE — Telephone Encounter (Signed)
Gave pt appt for lab,md and chemo for January and February 2015 °

## 2013-09-09 NOTE — Patient Instructions (Signed)

## 2013-09-09 NOTE — Telephone Encounter (Signed)
Per staff message and POF I have scheduled appts.  JMW  

## 2013-09-10 ENCOUNTER — Telehealth: Payer: Self-pay | Admitting: Internal Medicine

## 2013-09-10 NOTE — Progress Notes (Signed)
[place] OFFICE PROGRESS NOTE  Adella Hare, MD [address] [address]  DIAGNOSIS: Multiple myeloma - Plan: Basic metabolic panel (Bmet) - [facility], CBC with Differential, CBC with Differential, Comprehensive metabolic panel  Chief Complaint  Patient presents with  . Multiple Myeloma    CURRENT THERAPY:   S/P Melphalan 140 mg/m2 on 01/27/13 and stem cell transplant on 01/28/13. Maintenance velcade started on 07/07/2013.  INTERVAL HISTORY: Trevor Michael 73 y.o. 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  He was last seen by Dr. Samule Ohm of [facility] on 05/28/2013, six week post autologous transplant.  At that time, his counts were still recovering and maintenance therapy was placed on hold.  Today, the patient is accompanied by his wife, [name].  He was last seen by me on 08/04/2013. He reports that his energy level is at baseline.  His weight is stable. His appetite is good. He denies any fever or chills.  He also denies recent hospitalizations or emergency room visits. In interim, he saw Dr. Suann Larry for discussion regarding his elevated PSA and was told he likely has low grade disease which can be managed expectantly but observation for now.   MEDICAL HISTORY: Past Medical History  Diagnosis Date  . Other and unspecified hyperlipidemia   . Essential hypertension, benign   . BPH (benign prostatic hyperplasia)   . Early stage glaucoma   . Heart murmur     "since my teens" (06/18/2012)  . Cervical stenosis of spine   . GERD (gastroesophageal reflux disease)   . Bone cancer     multiple bone lesion/right humerus  . Spinal stenosis   . Chronic kidney disease     renal insufficiency  . Arthritis   . DDD (degenerative disc disease)     c-5=-c6, and spondylosis  . Cancer 05/2012    IgA lambda multiple myeloma  . Anorexia 06/25/12    1 month hx   . Diverticulitis   . History of chemotherapy  07/01/12    Velcade,Cytoxin and decadron    INTERIM HISTORY: has HYPERLIPIDEMIA, MILD; HYPERTENSION, MILD; BENIGN PROSTATIC HYPERTROPHY; Routine health maintenance; Rhinitis, non-allergic; Multiple myeloma; and Bone cancer on his problem list.    ALLERGIES:  has No Known Allergies.  MEDICATIONS: has a current medication list which includes the following prescription(s): acetaminophen, acyclovir, allopurinol, amlodipine, atenolol-chlorthalidone, atenolol-chlorthalidone, bimatoprost, brimonidine-timolol, dexamethasone, esomeprazole, loratadine, lorazepam, nexium, ondansetron, potassium chloride sa, prochlorperazine, and simvastatin.  SURGICAL HISTORY:  Past Surgical History  Procedure Laterality Date  . Lipoma excision  07/1986    left shoulder/scapula  . Esophagogastroduodenoscopy  06/20/2012    Procedure: ESOPHAGOGASTRODUODENOSCOPY (EGD);  Surgeon: Beryle Beams, MD;  Location: [facility] ENDOSCOPY;  Service: Endoscopy;  Laterality: N/A;  . Prostate biopsy  2008    benign   PROBLEM LIST:  1. IgA lambda multiple myeloma diagnosed in late October 2013. The patient has anemia, thrombocytopenia, multiple bone lesions, renal insufficiency, and monoclonal proteins in the serum and urine. Bone marrow carried out on 06/24/2012 yielded 48% plasma cells by morphology. Cytogenetic analysis revealed the presence of normal male chromosomes with no observable clonal chromosomal abnormalities. There was a single cell with additional chromosome material on 3q, loss of chromosome 13 and a marker chromosome. DNA probe panel disclosed a gain of chromosome 11 which is consistent with abnormalities associated with multiple myeloma. A 24-hour urine collection on 06/27/2012 yielded 17.1 g of protein of which 16.9 g  was free lambda light chains. Beta 2  microglobulin was 5.8. Albumin 3.2. IgG level on 06/19/2012 was 6320. Myeloma is ISS stage III.  Chemotherapy with Velcade, Cytoxan, and Decadron was started on 07/01/2012.  Radiation treatments were administered to the right humerus 25 Gy in 10 fractions from 07/10/2012 through 07/28/2012. Neulasta was started on 08/16/2012 because of leukopenia and neutropenia. Zometa was started on 09/11/2012 after the patient had received dental clearance. Doses of the current chemotherapy program utilizing subcutaneous Velcade, IV Cytoxan, and IV Decadron were increased on 11/06/2012. These treatments were concluded on 01/02/2013. Bone marrow aspirate and biopsy was carried out on 12/04/2012 and showed infiltration with 24% plasma cells. Cytogenetic analysis revealed the  presence of 2 clonal cell lines. The first cell line was chromosomally normal (25%). The 2nd cell line was cytogenetically abnormal, missing a Y chromosome. A single cell with an extra chromosome 11, 14, 15, and 22, with an 11;14 translocation was observed. FISH analysis showed a gain of chromosome 11 in 15% of the cells. The patient underwent high-dose melphalan 140 mg/sq m on 01/27/2013, followed by autologous stem cell transplant on the following day. The patient had received Neupogen preceding these treatments in order to harvest stem cells. The patient did extremely well with the exception  of some nausea and vomiting. He really had no complications nor did he require hospitalization. He received platelet transfusions on a couple of occasions, also 1 or 2 doses of Neupogen. He apparently had no fever, infections or bleeding problems.  04/16/13. SPEP negative. 2. Renal insufficiency. Creatinine clearance on 06/27/2012 was 68 mL/min.  3. A 2.3 cm lytic lesion in the right humerus at risk for pathologic fracture. Radiation treatments were administered to the right humerus 25 Gy in 10 fractions from 07/10/2012 through 07/28/2012.  4. Hypertension.  5. Benign prostate hypertrophy and elevated prostate specific antigen, under the care of Dr. Rana Snare. The patient had a prostate biopsy in 2008 that was benign.  6.  Dyslipidemia.  7. Arthritis involving the cervical spine  8. Glaucoma.   REVIEW OF SYSTEMS:   Constitutional: Denies fevers, chills or abnormal weight loss Eyes: Denies blurriness of vision Ears, nose, mouth, throat, and face: Denies mucositis or sore throat Respiratory: Denies cough, dyspnea or wheezes Cardiovascular: Denies palpitation, chest discomfort or lower extremity swelling Gastrointestinal:  Denies nausea, heartburn or change in bowel habits Skin: Denies abnormal skin rashes Lymphatics: Denies new lymphadenopathy or easy bruising Neurological:Denies numbness, tingling or new weaknesses Behavioral/Psych: Mood is stable, no new changes  All other systems were reviewed with the patient and are negative.  PHYSICAL EXAMINATION: ECOG PERFORMANCE STATUS: 0 - Asymptomatic  Blood pressure 141/65, pulse 75, temperature 97.7 F (36.5 C), temperature source Oral, resp. rate 18, height _0  (1.753 m), weight 168 lb 8 oz (76.431 kg), SpO2 100.00%.  GENERAL:alert, no distress and comfortable SKIN: skin color, texture, turgor are normal, no rashes or significant lesions EYES: normal, Conjunctiva are pink and non-injected, sclera clear OROPHARYNX:no exudate, no erythema and lips, buccal mucosa, and tongue normal  NECK: supple, thyroid normal size, non-tender, without nodularity LYMPH:  no palpable lymphadenopathy in the cervical, axillary or supraclavicular LUNGS: clear to auscultation and percussion with normal breathing effort HEART: regular rate & rhythm and no murmurs and no lower extremity edema ABDOMEN:abdomen soft, non-tender and normal bowel sounds Musculoskeletal:no cyanosis of digits and no clubbing  NEURO: alert & oriented x 3 with fluent speech, no focal motor/sensory deficits  LABORATORY DATA: No results found  for this or any previous visit (from the past 48 hour(s)).  Labs:  Lab Results  Component Value Date   WBC 4.3 09/01/2013   HGB 11.3* 09/01/2013   HCT 33.0*  09/01/2013   MCV 91.6 09/01/2013   PLT 113* 09/01/2013   NEUTROABS 2.6 09/01/2013      Chemistry      Component Value Date/Time   NA 144 09/01/2013 1125   NA 139 06/23/2012 1425   K 3.6 09/01/2013 1125   K 3.9 06/23/2012 1425   CL 102 12/05/2012 0845   CL 99 06/23/2012 1425   CO2 27 09/01/2013 1125   CO2 29 06/23/2012 1425   BUN 12.8 09/01/2013 1125   BUN 18 06/23/2012 1425   CREATININE 1.2 09/01/2013 1125   CREATININE 1.88* 06/27/2012 1039   CREATININE 2.4* 06/23/2012 1425      Component Value Date/Time   CALCIUM 10.5* 09/01/2013 1125   CALCIUM 9.1 06/23/2012 1425   ALKPHOS 75 09/01/2013 1125   ALKPHOS 44 06/23/2012 1425   AST 29 09/01/2013 1125   AST 45* 06/23/2012 1425   ALT 15 09/01/2013 1125   ALT 19 06/23/2012 1425   BILITOT 0.57 09/01/2013 1125   BILITOT 0.5 06/23/2012 1425      RADIOGRAPHIC STUDIES: 1. MRI of the cervical spine without IV contrast on 11/07/2009 showed disk degeneration and spondylosis, most severe at C5-C6 where there is mild to moderate spinal stenosis. There is foraminal encroachment at this level, left greater than right. There were degenerative changes at other levels which were described in detail in the body of the report.  2. Chest x-ray, 2 view, from 11/16/2009 showed no acute findings.  3. CT abdomen and pelvis without IV contrast on 06/18/2012 showed multiple lytic bone lesions highly suspicious for metastatic disease or multiple myeloma. The largest lesion seen with some cortical destruction in the anterior aspect of the iliac bone measured 3 cm. There was no evidence of colitis or diverticulitis. There was no evidence for hydronephrosis or hydroureter. There were bilateral probable renal cysts. There was an enlarged prostate gland with indentation of the urinary bladder base. There was a small right hydrocele.  4. Metastatic bone survey from 06/19/2012 showed lytic lesions in the calvaria and right hemipelvis and a possible lesion in the right humerus. This latter  lesion was not specifically mentioned in the report.  5. Nuclear bone scan on 06/19/2012 showed some increased uptake involving the femur and humerus bilaterally, possibly reflecting changes of metabolic bone disease. There was a lack of correlation with the bone scan and the findings seen on CT and x-rays, raising the possibility of multiple myeloma.  6. Renal ultrasound on 06/19/2012 showed increased echogenicity of the renal parenchyma bilaterally consistent with renal medical disease. There was no evidence for obstruction. There was a slightly enlarged prostate gland.  7. CT scan of the right humerus without IV contrast on 06/20/2012 showed 2.3-cm lucent lesion with probable mild endosteal thinning anteriorly near the bicipital groove. There were several small lytic lesions present within the humeral head, scapula, glenoid, and proximal radius. No pathologic fracture or soft tissue mass was seen. There were a few small nodules within the visualized right lung, the largest of which was 4 mm in the upper lobe on image 78. 8. Metastatic bone survey (07/22/2011) demonstrates multiple new lytic lesions are noted with innumerable lesions noted throughout the skull, left scapula, left humerus, and both femurs. These findings are indicative of progressive myeloma.  07/21/2013 METASTATIC BONE  SURVEY  COMPARISON: Bone survey 06/19/2012.  FINDINGS:  Following bony structures were evaluated:  Skull: Innumerable new lytic lesions are present consistent with  progressive multiple myeloma.  Two view cervical spine: Severe degenerative change. No focal  abnormality.  Two views thoracic spine: Diffuse degenerative change. No focal  abnormality.  Two views lumbar spine: Diffuse degenerative change. No focal  abnormality.  Left shoulder and humerus: Progressive punctate lytic lesions in the  scapula and humerus consistent with progressive myeloma .  Right shoulder and humerus: Stable.  Left femur: Multiple new  lytic lesions are present consistent with  progressive myeloma.  Right femur: Multiple new lytic lesions consistent with progressive  myeloma.  IMPRESSION:  Multiple new lytic lesions are noted with innumerable lesions noted  throughout the skull, left scapula, left humerus, and both femurs.  These findings are indicative of progressive myeloma.   PROCEDURES:  1. CT-guided bone marrow aspirate and biopsy was carried out on 06/24/2012 and showed 48% plasma cells.  2. Bone marrow aspirate and biopsy was carried out on 12/04/2012 and showed infiltration with 24% plasma cells.   ASSESSMENT: BARTLEY VUOLO 73 y.o. male with a history of Multiple myeloma - Plan: Basic metabolic panel (Bmet) - [facility], CBC with Differential, CBC with Differential, Comprehensive metabolic panel  PLAN:  1. Stage III IgA lambda multiple myeloma S/p auto SCT. --Mr. Coker is doing very well. He had no toxicities or side effects and seems to be recovering well. His ANC is 2600. His calcium is 10.4; creatinine is  Up to 1.2 from 0.8; Hemoglobin 11.3.  WBC is 5.3.  SPEP is as noted above.   SPEP at [facility] on 08/29 was negative.   --We started maintenance velcade q 14 days today.  He was provided a handout. He understand the benefits include keeping his MM controlled.  The side-effects include but are not limited to nausea/vomiting, diarrhea, constipation, thrombocytopenia, peripheral neuropathy, insomnia, pain, neutropenia, dizziness, herpes viral infection, hypotension.  He understood the benefits and risks and agrees to proceed with therapy.  He will continue to take acyclovir for HSV prophylaxis.   --We  ordered an annual skeletal survey as noted above.   --We discussed restarting zometa next visit.  He was provided a handout. He will start on 01/27.    2. Follow-up -- We will schedule a follow-up for CBC, chemistries, SPEP and IFE, Kappa Lambda free light chains. All questions were answered. The patient knows to call the  clinic with any problems, questions or concerns. We can certainly see the patient much sooner if necessary.  I spent 15 minutes counseling the patient face to face. The total time spent in the appointment was 25 minutes.    Ferris Tally, MD 09/10/2013 5:29 AM

## 2013-09-10 NOTE — Telephone Encounter (Signed)
Talked to  pt appt for lab,md and chemo for January and February 2015

## 2013-09-15 ENCOUNTER — Ambulatory Visit (HOSPITAL_BASED_OUTPATIENT_CLINIC_OR_DEPARTMENT_OTHER): Payer: Medicare Other

## 2013-09-15 ENCOUNTER — Other Ambulatory Visit (HOSPITAL_BASED_OUTPATIENT_CLINIC_OR_DEPARTMENT_OTHER): Payer: Medicare Other

## 2013-09-15 VITALS — BP 131/57 | HR 70 | Temp 98.1°F | Resp 19 | Ht 69.0 in

## 2013-09-15 DIAGNOSIS — C9000 Multiple myeloma not having achieved remission: Secondary | ICD-10-CM

## 2013-09-15 LAB — CBC WITH DIFFERENTIAL/PLATELET
BASO%: 0.5 % (ref 0.0–2.0)
BASOS ABS: 0 10*3/uL (ref 0.0–0.1)
EOS ABS: 0.1 10*3/uL (ref 0.0–0.5)
EOS%: 1.4 % (ref 0.0–7.0)
HEMATOCRIT: 32.9 % — AB (ref 38.4–49.9)
HEMOGLOBIN: 11.4 g/dL — AB (ref 13.0–17.1)
LYMPH#: 1.3 10*3/uL (ref 0.9–3.3)
LYMPH%: 31.9 % (ref 14.0–49.0)
MCH: 31.2 pg (ref 27.2–33.4)
MCHC: 34.7 g/dL (ref 32.0–36.0)
MCV: 90.1 fL (ref 79.3–98.0)
MONO#: 0.5 10*3/uL (ref 0.1–0.9)
MONO%: 12.4 % (ref 0.0–14.0)
NEUT%: 53.8 % (ref 39.0–75.0)
NEUTROS ABS: 2.3 10*3/uL (ref 1.5–6.5)
PLATELETS: 78 10*3/uL — AB (ref 140–400)
RBC: 3.65 10*6/uL — ABNORMAL LOW (ref 4.20–5.82)
RDW: 15.1 % — ABNORMAL HIGH (ref 11.0–14.6)
WBC: 4.2 10*3/uL (ref 4.0–10.3)
nRBC: 0 % (ref 0–0)

## 2013-09-15 LAB — TECHNOLOGIST REVIEW

## 2013-09-15 MED ORDER — SODIUM CHLORIDE 0.9 % IJ SOLN
3.0000 mL | Freq: Once | INTRAMUSCULAR | Status: DC | PRN
  Filled 2013-09-15: qty 10

## 2013-09-15 MED ORDER — SODIUM CHLORIDE 0.9 % IV SOLN
3.5000 mg | Freq: Once | INTRAVENOUS | Status: AC
  Administered 2013-09-15: 3.5 mg via INTRAVENOUS
  Filled 2013-09-15: qty 4.38

## 2013-09-15 MED ORDER — SODIUM CHLORIDE 0.9 % IV SOLN
Freq: Once | INTRAVENOUS | Status: AC
  Administered 2013-09-15: 13:00:00 via INTRAVENOUS

## 2013-09-15 MED ORDER — SODIUM CHLORIDE 0.9 % IJ SOLN
10.0000 mL | INTRAMUSCULAR | Status: DC | PRN
  Filled 2013-09-15: qty 10

## 2013-09-15 NOTE — Progress Notes (Signed)
Per Dr. Juliann Mule hold Velcade secondary to platelets of 78.  OK to proceed with Zometa.

## 2013-09-15 NOTE — Patient Instructions (Signed)

## 2013-09-22 ENCOUNTER — Other Ambulatory Visit (HOSPITAL_BASED_OUTPATIENT_CLINIC_OR_DEPARTMENT_OTHER): Payer: Medicare Other

## 2013-09-22 ENCOUNTER — Ambulatory Visit: Payer: Medicare Other

## 2013-09-22 DIAGNOSIS — C9000 Multiple myeloma not having achieved remission: Secondary | ICD-10-CM

## 2013-09-22 LAB — CBC WITH DIFFERENTIAL/PLATELET
BASO%: 0.6 % (ref 0.0–2.0)
Basophils Absolute: 0 10*3/uL (ref 0.0–0.1)
EOS%: 1.4 % (ref 0.0–7.0)
Eosinophils Absolute: 0 10*3/uL (ref 0.0–0.5)
HEMATOCRIT: 31.3 % — AB (ref 38.4–49.9)
HGB: 10.7 g/dL — ABNORMAL LOW (ref 13.0–17.1)
LYMPH%: 23.7 % (ref 14.0–49.0)
MCH: 31.8 pg (ref 27.2–33.4)
MCHC: 34.1 g/dL (ref 32.0–36.0)
MCV: 93 fL (ref 79.3–98.0)
MONO#: 0.5 10*3/uL (ref 0.1–0.9)
MONO%: 17.3 % — AB (ref 0.0–14.0)
NEUT#: 1.7 10*3/uL (ref 1.5–6.5)
NEUT%: 57 % (ref 39.0–75.0)
PLATELETS: 72 10*3/uL — AB (ref 140–400)
RBC: 3.37 10*6/uL — AB (ref 4.20–5.82)
RDW: 15.9 % — ABNORMAL HIGH (ref 11.0–14.6)
WBC: 2.9 10*3/uL — ABNORMAL LOW (ref 4.0–10.3)
lymph#: 0.7 10*3/uL — ABNORMAL LOW (ref 0.9–3.3)

## 2013-09-22 LAB — BASIC METABOLIC PANEL (CC13)
Anion Gap: 13 mEq/L — ABNORMAL HIGH (ref 3–11)
BUN: 13.3 mg/dL (ref 7.0–26.0)
CHLORIDE: 107 meq/L (ref 98–109)
CO2: 22 mEq/L (ref 22–29)
CREATININE: 1.3 mg/dL (ref 0.7–1.3)
Calcium: 9.4 mg/dL (ref 8.4–10.4)
Glucose: 133 mg/dl (ref 70–140)
POTASSIUM: 3.6 meq/L (ref 3.5–5.1)
Sodium: 142 mEq/L (ref 136–145)

## 2013-09-29 ENCOUNTER — Telehealth: Payer: Self-pay | Admitting: Internal Medicine

## 2013-09-29 ENCOUNTER — Other Ambulatory Visit: Payer: Self-pay | Admitting: Medical Oncology

## 2013-09-29 ENCOUNTER — Ambulatory Visit (HOSPITAL_BASED_OUTPATIENT_CLINIC_OR_DEPARTMENT_OTHER): Payer: Medicare Other | Admitting: Internal Medicine

## 2013-09-29 ENCOUNTER — Other Ambulatory Visit (HOSPITAL_BASED_OUTPATIENT_CLINIC_OR_DEPARTMENT_OTHER): Payer: Medicare Other

## 2013-09-29 VITALS — BP 137/69 | HR 86 | Temp 97.0°F | Resp 18 | Ht 69.0 in | Wt 165.7 lb

## 2013-09-29 DIAGNOSIS — C9000 Multiple myeloma not having achieved remission: Secondary | ICD-10-CM

## 2013-09-29 LAB — COMPREHENSIVE METABOLIC PANEL (CC13)
ALK PHOS: 76 U/L (ref 40–150)
ALT: 17 U/L (ref 0–55)
ANION GAP: 14 meq/L — AB (ref 3–11)
AST: 37 U/L — AB (ref 5–34)
Albumin: 4 g/dL (ref 3.5–5.0)
BUN: 13.7 mg/dL (ref 7.0–26.0)
CO2: 22 meq/L (ref 22–29)
CREATININE: 1.3 mg/dL (ref 0.7–1.3)
Calcium: 9.8 mg/dL (ref 8.4–10.4)
Chloride: 107 mEq/L (ref 98–109)
Glucose: 115 mg/dl (ref 70–140)
Potassium: 3.2 mEq/L — ABNORMAL LOW (ref 3.5–5.1)
SODIUM: 143 meq/L (ref 136–145)
TOTAL PROTEIN: 7.7 g/dL (ref 6.4–8.3)
Total Bilirubin: 0.7 mg/dL (ref 0.20–1.20)

## 2013-09-29 LAB — CBC WITH DIFFERENTIAL/PLATELET
BASO%: 0 % (ref 0.0–2.0)
Basophils Absolute: 0 10*3/uL (ref 0.0–0.1)
EOS%: 0.8 % (ref 0.0–7.0)
Eosinophils Absolute: 0 10*3/uL (ref 0.0–0.5)
HCT: 29.4 % — ABNORMAL LOW (ref 38.4–49.9)
HGB: 10 g/dL — ABNORMAL LOW (ref 13.0–17.1)
LYMPH%: 36.8 % (ref 14.0–49.0)
MCH: 31.1 pg (ref 27.2–33.4)
MCHC: 34 g/dL (ref 32.0–36.0)
MCV: 91.3 fL (ref 79.3–98.0)
MONO#: 0.5 10*3/uL (ref 0.1–0.9)
MONO%: 18.6 % — AB (ref 0.0–14.0)
NEUT#: 1.1 10*3/uL — ABNORMAL LOW (ref 1.5–6.5)
NEUT%: 43.8 % (ref 39.0–75.0)
Platelets: 55 10*3/uL — ABNORMAL LOW (ref 140–400)
RBC: 3.22 10*6/uL — AB (ref 4.20–5.82)
RDW: 15.3 % — ABNORMAL HIGH (ref 11.0–14.6)
WBC: 2.6 10*3/uL — AB (ref 4.0–10.3)
lymph#: 1 10*3/uL (ref 0.9–3.3)

## 2013-09-29 NOTE — Telephone Encounter (Signed)
Cancelled chemo for 2/17 and 2/24 per MD, gave pt appt for lab and MD  for 2/24 and lab draw on 2/17

## 2013-09-29 NOTE — Patient Instructions (Signed)
Anemia, Nonspecific Anemia is a condition in which the concentration of red blood cells or hemoglobin in the blood is below normal. Hemoglobin is a substance in red blood cells that carries oxygen to the tissues of the body. Anemia results in not enough oxygen reaching these tissues.  CAUSES  Common causes of anemia include:   Excessive bleeding. Bleeding may be internal or external. This includes excessive bleeding from periods (in women) or from the intestine.   Poor nutrition.   Chronic kidney, thyroid, and liver disease.  Bone marrow disorders that decrease red blood cell production.  Cancer and treatments for cancer.  HIV, AIDS, and their treatments.  Spleen problems that increase red blood cell destruction.  Blood disorders.  Excess destruction of red blood cells due to infection, medicines, and autoimmune disorders. SIGNS AND SYMPTOMS   Minor weakness.   Dizziness.   Headache.  Palpitations.   Shortness of breath, especially with exercise.   Paleness.  Cold sensitivity.  Indigestion.  Nausea.  Difficulty sleeping.  Difficulty concentrating. Symptoms may occur suddenly or they may develop slowly.  DIAGNOSIS  Additional blood tests are often needed. These help your health care provider determine the best treatment. Your health care provider will check your stool for blood and look for other causes of blood loss.  TREATMENT  Treatment varies depending on the cause of the anemia. Treatment can include:   Supplements of iron, vitamin 123456, or folic acid.   Hormone medicines.   A blood transfusion. This may be needed if blood loss is severe.   Hospitalization. This may be needed if there is significant continual blood loss.   Dietary changes.  Spleen removal. HOME CARE INSTRUCTIONS Keep all follow-up appointments. It often takes many weeks to correct anemia, and having your health care provider check on your condition and your response to  treatment is very important. SEEK IMMEDIATE MEDICAL CARE IF:   You develop extreme weakness, shortness of breath, or chest pain.   You become dizzy or have trouble concentrating.  You develop heavy vaginal bleeding.   You develop a rash.   You have bloody or black, tarry stools.   You faint.   You vomit up blood.   You vomit repeatedly.   You have abdominal pain.  You have a fever or persistent symptoms for more than 2 3 days.   You have a fever and your symptoms suddenly get worse.   You are dehydrated.  MAKE SURE YOU:  Understand these instructions.  Will watch your condition.  Will get help right away if you are not doing well or get worse. Document Released: 09/13/2004 Document Revised: 04/08/2013 Document Reviewed: 01/30/2013 [facility] Patient Information 2014 [place]. Neutropenia Neutropenia is a condition that occurs when the level of a certain type of white blood cell (neutrophil) in your body becomes lower than normal. Neutrophils are made in the bone marrow and fight infections. These cells protect against bacteria and viruses. The fewer neutrophils you have, and the longer your body remains without them, the greater your risk of getting a severe infection becomes. CAUSES  The cause of neutropenia may be hard to determine. However, it is usually due to 3 main problems:   Decreased production of neutrophils. This may be due to:  Certain medicines such as chemotherapy.  Genetic problems.  Cancer.  Radiation treatments.  Vitamin deficiency.  Some pesticides.  Increased destruction of neutrophils. This may be due to:  Overwhelming infections.  Hemolytic anemia. This is when the  body destroys its own blood cells.  Chemotherapy.  Neutrophils moving to areas of the body where they cannot fight infections. This may be due to:  Dialysis procedures.  Conditions where the spleen becomes enlarged. Neutrophils are held in the spleen and  are not available to the rest of the body.  Overwhelming infections. The neutrophils are held in the area of the infection and are not available to the rest of the body. SYMPTOMS  There are no specific symptoms of neutropenia. The lack of neutrophils can result in an infection, and an infection can cause various problems. DIAGNOSIS  Diagnosis is made by a blood test. A complete blood count is performed. The normal level of neutrophils in human blood differs with age and race. Infants have lower counts than older children and adults. African Americans have lower counts than Caucasians or Asians. The average adult level is 1500 cells/mm3 of blood. Neutrophil counts are interpreted as follows:  Greater than 1000 cells/mm3 gives normal protection against infection.  500 to 1000 cells/mm3 gives an increased risk for infection.  200 to 500 cells/mm3 is a greater risk for severe infection.  Lower than 200 cells/mm3 is a marked risk of infection. This may require hospitalization and treatment with antibiotic medicines. TREATMENT  Treatment depends on the underlying cause, severity, and presence of infections or symptoms. It also depends on your health. Your caregiver will discuss the treatment plan with you. Mild cases are often easily treated and have a good outcome. Preventative measures may also be started to limit your risk of infections. Treatment can include:  Taking antibiotics.  Stopping medicines that are known to cause neutropenia.  Correcting nutritional deficiencies by eating green vegetables to supply folic acid and taking vitamin B supplements.  Stopping exposure to pesticides if your neutropenia is related to pesticide exposure.  Taking a blood growth factor called sargramostim, pegfilgrastim, or filgrastim if you are undergoing chemotherapy for cancer. This stimulates white blood cell production.  Removal of the spleen if you have Felty's syndrome and have repeated  infections. HOME CARE INSTRUCTIONS   Follow your caregiver's instructions about when you need to have blood work done.  Wash your hands often. Make sure others who come in contact with you also wash their hands.  Wash raw fruits and vegetables before eating them. They can carry bacteria and fungi.  Avoid people with colds or spreadable (contagious) diseases (chickenpox, herpes zoster, influenza).  Avoid large crowds.  Avoid construction areas. The dust can release fungus into the air.  Be cautious around children in daycare or school environments.  Take care of your respiratory system by coughing and deep breathing.  Bathe daily.  Protect your skin from cuts and burns.  Do not work in the garden or with flowers and plants.  Care for the mouth before and after meals by brushing with a soft toothbrush. If you have mucositis, do not use mouthwash. Mouthwash contains alcohol and can dry out the mouth even more.  Clean the area between the genitals and the anus (perineal area) after urination and bowel movements. Women need to wipe from front to back.  Use a water soluble lubricant during sexual intercourse and practice good hygiene after. Do not have intercourse if you are severely neutropenic. Check with your caregiver for guidelines.  Exercise daily as tolerated.  Avoid people who were vaccinated with a live vaccine in the past 30 days. You should not receive live vaccines (polio, typhoid).  Do not provide direct care for  pets. Avoid animal droppings. Do not clean litter boxes and bird cages.  Do not share food utensils.  Do not use tampons, enemas, or rectal suppositories unless directed by your caregiver.  Use an electric razor to remove hair.  Wash your hands after handling magazines, letters, and newspapers. SEEK IMMEDIATE MEDICAL CARE IF:   You have a fever.  You have chills or start to shake.  You feel nauseous or vomit.  You develop mouth sores.  You develop  aches and pains.  You have redness and swelling around open wounds.  Your skin is warm to the touch.  You have pus coming from your wounds.  You develop swollen lymph nodes.  You feel weak or fatigued.  You develop red streaks on the skin. MAKE SURE YOU:  Understand these instructions.  Will watch your condition.  Will get help right away if you are not doing well or get worse. Document Released: 01/26/2002 Document Revised: 10/29/2011 Document Reviewed: 02/23/2011 [facility] Patient Information 2014 [place], [place].

## 2013-09-30 NOTE — Progress Notes (Signed)
[facility]revor [facility] OFFICE PROGRESS NOTE  Adella Hare, MD [address] [address]  DIAGNOSIS: Multiple myeloma - Plan: CBC with Differential, CBC with Differential, Comprehensive metabolic panel (Cmet) - [facility], Lactate dehydrogenase (LDH) - [facility]  Chief Complaint  Patient presents with  . Multiple Myeloma    CURRENT THERAPY:   S/P Melphalan 140 mg/m2 on 01/27/13 and stem cell transplant on 01/28/13. Maintenance velcade started on 07/07/2013. Zometa every 4 weeks.   INTERVAL HISTORY: Trevor Michael 73 y.o. 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  He was last seen by Dr. Samule Ohm of [facility] on 05/28/2013, six week post autologous transplant.  At that time, his counts were still recovering and maintenance therapy was placed on hold. He was last seen by me on 09/09/2013. He reports that his energy level is at baseline.  His weight is stable. His appetite is good. He denies any fever or chills.  He also denies recent hospitalizations or emergency room visits. As reported previously,  he saw Dr. Suann Larry for discussion regarding his elevated PSA and was told he likely has low grade disease which can be managed expectantly but observation for now.  His Velcade has been on hold since his last visit due count recovery.   MEDICAL HISTORY: Past Medical History  Diagnosis Date  . Other and unspecified hyperlipidemia   . Essential hypertension, benign   . BPH (benign prostatic hyperplasia)   . Early stage glaucoma   . Heart murmur     "since my teens" (06/18/2012)  . Cervical stenosis of spine   . GERD (gastroesophageal reflux disease)   . Bone cancer     multiple bone lesion/right humerus  . Spinal stenosis   . Chronic kidney disease     renal insufficiency  . Arthritis   . DDD (degenerative disc disease)     c-5=-c6, and spondylosis  . Cancer 05/2012    IgA lambda multiple myeloma  . Anorexia 06/25/12    1  month hx   . Diverticulitis   . History of chemotherapy 07/01/12    Velcade,Cytoxin and decadron    INTERIM HISTORY: has HYPERLIPIDEMIA, MILD; HYPERTENSION, MILD; BENIGN PROSTATIC HYPERTROPHY; Routine health maintenance; Rhinitis, non-allergic; Multiple myeloma; and Bone cancer on his problem list.    ALLERGIES:  has No Known Allergies.  MEDICATIONS: has a current medication list which includes the following prescription(s): acetaminophen, acyclovir, allopurinol, amlodipine, atenolol-chlorthalidone, atenolol-chlorthalidone, bimatoprost, brimonidine-timolol, dexamethasone, esomeprazole, loratadine, lorazepam, nexium, ondansetron, potassium chloride sa, prochlorperazine, and simvastatin.  SURGICAL HISTORY:  Past Surgical History  Procedure Laterality Date  . Lipoma excision  07/1986    left shoulder/scapula  . Esophagogastroduodenoscopy  06/20/2012    Procedure: ESOPHAGOGASTRODUODENOSCOPY (EGD);  Surgeon: Beryle Beams, MD;  Location: [facility] ENDOSCOPY;  Service: Endoscopy;  Laterality: N/A;  . Prostate biopsy  2008    benign   PROBLEM LIST:  1. IgA lambda multiple myeloma diagnosed in late October 2013. The patient has anemia, thrombocytopenia, multiple bone lesions, renal insufficiency, and monoclonal proteins in the serum and urine. Bone marrow carried out on 06/24/2012 yielded 48% plasma cells by morphology. Cytogenetic analysis revealed the presence of normal male chromosomes with no observable clonal chromosomal abnormalities. There was a single cell with additional chromosome material on 3q, loss of chromosome 13 and a marker chromosome. DNA probe panel disclosed a gain of chromosome 11 which is consistent with abnormalities associated with multiple myeloma. A 24-hour urine  collection on 06/27/2012 yielded 17.1 g of protein of which 16.9 g was free lambda light chains. Beta 2 microglobulin was 5.8. Albumin 3.2. IgG level on 06/19/2012 was 6320. Myeloma is ISS stage III.  Chemotherapy with  Velcade, Cytoxan, and Decadron was started on 07/01/2012. Radiation treatments were administered to the right humerus 25 Gy in 10 fractions from 07/10/2012 through 07/28/2012. Neulasta was started on 08/16/2012 because of leukopenia and neutropenia. Zometa was started on 09/11/2012 after the patient had received dental clearance. Doses of the current chemotherapy program utilizing subcutaneous Velcade, IV Cytoxan, and IV Decadron were increased on 11/06/2012. These treatments were concluded on 01/02/2013. Bone marrow aspirate and biopsy was carried out on 12/04/2012 and showed infiltration with 24% plasma cells. Cytogenetic analysis revealed the presence of 2 clonal cell lines. The first cell line was chromosomally normal (25%). The 2nd cell line was cytogenetically abnormal, missing a Y chromosome. A single cell with an extra chromosome 11, 14, 15, and 22, with an 11;14 translocation was observed. FISH analysis showed a gain of chromosome 11 in 15% of the cells. The patient underwent high-dose melphalan 140 mg/sq m on 01/27/2013, followed by autologous stem cell transplant on the following day. The patient had received Neupogen preceding these treatments in order to harvest stem cells. The patient did extremely well with the exception of some nausea and vomiting. He really had no complications nor did he require hospitalization. He received platelet transfusions on a couple of occasions, also 1 or 2 doses of Neupogen. He apparently had no fever, infections or bleeding problems.  04/16/13. SPEP negative. 2. Renal insufficiency. Creatinine clearance on 06/27/2012 was 68 mL/min.  3. A 2.3 cm lytic lesion in the right humerus at risk for pathologic fracture. Radiation treatments were administered to the right humerus 25 Gy in 10 fractions from 07/10/2012 through 07/28/2012.  4. Hypertension.  5. Benign prostate hypertrophy and elevated prostate specific antigen, under the care of Dr. Rana Snare. The patient had a  prostate biopsy in 2008 that was benign.  6. Dyslipidemia.  7. Arthritis involving the cervical spine  8. Glaucoma.   REVIEW OF SYSTEMS:   Constitutional: Denies fevers, chills or abnormal weight loss Eyes: Denies blurriness of vision Ears, nose, mouth, throat, and face: Denies mucositis or sore throat Respiratory: Denies cough, dyspnea or wheezes Cardiovascular: Denies palpitation, chest discomfort or lower extremity swelling Gastrointestinal:  Denies nausea, heartburn or change in bowel habits Skin: Denies abnormal skin rashes Lymphatics: Denies new lymphadenopathy or easy bruising Neurological:Denies numbness, tingling or new weaknesses Behavioral/Psych: Mood is stable, no new changes  All other systems were reviewed with the patient and are negative.  PHYSICAL EXAMINATION: ECOG PERFORMANCE STATUS: 0 - Asymptomatic  Blood pressure 137/69, pulse 86, temperature 97 F (36.1 C), temperature source Oral, resp. rate 18, height 5' 9"  (1.753 m), weight 165 lb 11.2 oz (75.161 kg).  GENERAL:alert, no distress and comfortable; well developed and well nourished.  SKIN: skin color, texture, turgor are normal, no rashes or significant lesions EYES: normal, Conjunctiva are pink and non-injected, sclera clear OROPHARYNX:no exudate, no erythema and lips, buccal mucosa, and tongue normal  NECK: supple, thyroid normal size, non-tender, without nodularity LYMPH:  no palpable lymphadenopathy in the cervical, axillary or supraclavicular LUNGS: clear to auscultation and percussion with normal breathing effort HEART: regular rate & rhythm and no murmurs and no lower extremity edema ABDOMEN:abdomen soft, non-tender and normal bowel sounds Musculoskeletal:no cyanosis of digits and no clubbing  NEURO: alert & oriented x 3  with fluent speech, no focal motor/sensory deficits  LABORATORY DATA: Results for orders placed in visit on 09/29/13 (from the past 48 hour(s))  CBC WITH DIFFERENTIAL     Status:  Abnormal   Collection Time    09/29/13  9:48 AM      Result Value Ref Range   WBC 2.6 (*) 4.0 - 10.3 10e3/uL   NEUT# 1.1 (*) 1.5 - 6.5 10e3/uL   HGB 10.0 (*) 13.0 - 17.1 g/dL   HCT 29.4 (*) 38.4 - 49.9 %   Platelets 55 (*) 140 - 400 10e3/uL   MCV 91.3  79.3 - 98.0 fL   MCH 31.1  27.2 - 33.4 pg   MCHC 34.0  32.0 - 36.0 g/dL   RBC 3.22 (*) 4.20 - 5.82 10e6/uL   RDW 15.3 (*) 11.0 - 14.6 %   lymph# 1.0  0.9 - 3.3 10e3/uL   MONO# 0.5  0.1 - 0.9 10e3/uL   Eosinophils Absolute 0.0  0.0 - 0.5 10e3/uL   Basophils Absolute 0.0  0.0 - 0.1 10e3/uL   NEUT% 43.8  39.0 - 75.0 %   LYMPH% 36.8  14.0 - 49.0 %   MONO% 18.6 (*) 0.0 - 14.0 %   EOS% 0.8  0.0 - 7.0 %   BASO% 0.0  0.0 - 2.0 %  COMPREHENSIVE METABOLIC PANEL (YF74)     Status: Abnormal   Collection Time    09/29/13  9:49 AM      Result Value Ref Range   Sodium 143  136 - 145 mEq/L   Potassium 3.2 (*) 3.5 - 5.1 mEq/L   Chloride 107  98 - 109 mEq/L   CO2 22  22 - 29 mEq/L   Glucose 115  70 - 140 mg/dl   BUN 13.7  7.0 - 26.0 mg/dL   Creatinine 1.3  0.7 - 1.3 mg/dL   Total Bilirubin 0.70  0.20 - 1.20 mg/dL   Alkaline Phosphatase 76  40 - 150 U/L   AST 37 (*) 5 - 34 U/L   ALT 17  0 - 55 U/L   Total Protein 7.7  6.4 - 8.3 g/dL   Albumin 4.0  3.5 - 5.0 g/dL   Calcium 9.8  8.4 - 10.4 mg/dL   Anion Gap 14 (*) 3 - 11 mEq/L    Labs:  Lab Results  Component Value Date   WBC 2.6* 09/29/2013   HGB 10.0* 09/29/2013   HCT 29.4* 09/29/2013   MCV 91.3 09/29/2013   PLT 55* 09/29/2013   NEUTROABS 1.1* 09/29/2013      Chemistry      Component Value Date/Time   NA 143 09/29/2013 0949   NA 139 06/23/2012 1425   K 3.2* 09/29/2013 0949   K 3.9 06/23/2012 1425   CL 102 12/05/2012 0845   CL 99 06/23/2012 1425   CO2 22 09/29/2013 0949   CO2 29 06/23/2012 1425   BUN 13.7 09/29/2013 0949   BUN 18 06/23/2012 1425   CREATININE 1.3 09/29/2013 0949   CREATININE 1.88* 06/27/2012 1039   CREATININE 2.4* 06/23/2012 1425      Component Value Date/Time   CALCIUM  9.8 09/29/2013 0949   CALCIUM 9.1 06/23/2012 1425   ALKPHOS 76 09/29/2013 0949   ALKPHOS 44 06/23/2012 1425   AST 37* 09/29/2013 0949   AST 45* 06/23/2012 1425   ALT 17 09/29/2013 0949   ALT 19 06/23/2012 1425   BILITOT 0.70 09/29/2013 0949   BILITOT 0.5 06/23/2012 1425  RADIOGRAPHIC STUDIES: 1. MRI of the cervical spine without IV contrast on 11/07/2009 showed disk degeneration and spondylosis, most severe at C5-C6 where there is mild to moderate spinal stenosis. There is foraminal encroachment at this level, left greater than right. There were degenerative changes at other levels which were described in detail in the body of the report.  2. Chest x-ray, 2 view, from 11/16/2009 showed no acute findings.  3. CT abdomen and pelvis without IV contrast on 06/18/2012 showed multiple lytic bone lesions highly suspicious for metastatic disease or multiple myeloma. The largest lesion seen with some cortical destruction in the anterior aspect of the iliac bone measured 3 cm. There was no evidence of colitis or diverticulitis. There was no evidence for hydronephrosis or hydroureter. There were bilateral probable renal cysts. There was an enlarged prostate gland with indentation of the urinary bladder base. There was a small right hydrocele.  4. Metastatic bone survey from 06/19/2012 showed lytic lesions in the calvaria and right hemipelvis and a possible lesion in the right humerus. This latter lesion was not specifically mentioned in the report.  5. Nuclear bone scan on 06/19/2012 showed some increased uptake involving the femur and humerus bilaterally, possibly reflecting changes of metabolic bone disease. There was a lack of correlation with the bone scan and the findings seen on CT and x-rays, raising the possibility of multiple myeloma.  6. Renal ultrasound on 06/19/2012 showed increased echogenicity of the renal parenchyma bilaterally consistent with renal medical disease. There was no evidence for  obstruction. There was a slightly enlarged prostate gland.  7. CT scan of the right humerus without IV contrast on 06/20/2012 showed 2.3-cm lucent lesion with probable mild endosteal thinning anteriorly near the bicipital groove. There were several small lytic lesions present within the humeral head, scapula, glenoid, and proximal radius. No pathologic fracture or soft tissue mass was seen. There were a few small nodules within the visualized right lung, the largest of which was 4 mm in the upper lobe on image 78. 8. Metastatic bone survey (07/22/2011) demonstrates multiple new lytic lesions are noted with innumerable lesions noted throughout the skull, left scapula, left humerus, and both femurs. These findings are indicative of progressive myeloma. 9. Metastatic bone survey (07/21/2013) demonstrates multiple new lytic lesions are noted with innumerable lesions noted  throughout the skull, left scapula, left humerus, and both femurs.  These findings are indicative of progressive myeloma.   PROCEDURES:  1. CT-guided bone marrow aspirate and biopsy was carried out on 06/24/2012 and showed 48% plasma cells.  2. Bone marrow aspirate and biopsy was carried out on 12/04/2012 and showed infiltration with 24% plasma cells.   ASSESSMENT: Trevor Michael 73 y.o. male with a history of Multiple myeloma - Plan: CBC with Differential, CBC with Differential, Comprehensive metabolic panel (Cmet) - [facility], Lactate dehydrogenase (LDH) - [facility]  PLAN:  1. Stage III IgA lambda multiple myeloma S/p auto SCT. --Mr. Squier is doing well clinically.  However, his counts have been depressed since his treatment nearly one month ago with a prolonged recovery phase.    His ANC is 1100.  He was counseled extensively on neutropenia and symptoms of anemia.  We will provide neupogen if repeat labs indicate neutropenia.  His calcium is 9.8; creatinine is  up to 1.3 from 1.2; Hemoglobin 10.0 down from 11.3.  WBC is 2.6 down from 5.3.      SPEP at [facility] on 08/29 was negative.   --We started maintenance velcade q 14  days on 07/07/2013 .  He was provided a handout.  He will continue to take acyclovir for HSV prophylaxis.  We  ordered an annual skeletal survey as noted above.  We discussed restarted zometa on 01/27.   --His delayed count recovery points toward chemotherapy effect presently.  Progressive MM also should be considered.  I have discussed with Dr. Samule Ohm of [facility] and he will follow up with her on 02/19.    We will repeat his labs in one week to ensure count stability.    2. Follow-up -- We will schedule a follow-up for CBC in one week.   Follow up with [place] on 02/26 to implement recommendations by Dr. Samule Ohm of [place].   All questions were answered. The patient knows to call the clinic with any problems, questions or concerns. We can certainly see the patient much sooner if necessary.  I spent 15 minutes counseling the patient face to face. The total time spent in the appointment was 25 minutes.    Ryu Cerreta, MD 09/30/2013 4:32 AM

## 2013-10-06 ENCOUNTER — Ambulatory Visit: Payer: Medicare Other

## 2013-10-06 ENCOUNTER — Other Ambulatory Visit: Payer: Self-pay | Admitting: *Deleted

## 2013-10-06 ENCOUNTER — Other Ambulatory Visit (HOSPITAL_BASED_OUTPATIENT_CLINIC_OR_DEPARTMENT_OTHER): Payer: Medicare Other

## 2013-10-06 DIAGNOSIS — C9000 Multiple myeloma not having achieved remission: Secondary | ICD-10-CM

## 2013-10-06 LAB — BASIC METABOLIC PANEL
BUN: 15 mg/dL (ref 6–23)
CO2: 26 mEq/L (ref 19–32)
Calcium: 9.8 mg/dL (ref 8.4–10.5)
Chloride: 102 mEq/L (ref 96–112)
Creatinine, Ser: 1.45 mg/dL — ABNORMAL HIGH (ref 0.50–1.35)
Glucose, Bld: 96 mg/dL (ref 70–99)
POTASSIUM: 3.4 meq/L — AB (ref 3.5–5.3)
SODIUM: 140 meq/L (ref 135–145)

## 2013-10-06 LAB — CBC WITH DIFFERENTIAL/PLATELET
BASO%: 0.4 % (ref 0.0–2.0)
Basophils Absolute: 0 10*3/uL (ref 0.0–0.1)
EOS%: 1.2 % (ref 0.0–7.0)
Eosinophils Absolute: 0 10*3/uL (ref 0.0–0.5)
HCT: 27 % — ABNORMAL LOW (ref 38.4–49.9)
HGB: 9.4 g/dL — ABNORMAL LOW (ref 13.0–17.1)
LYMPH%: 40.1 % (ref 14.0–49.0)
MCH: 31.2 pg (ref 27.2–33.4)
MCHC: 34.8 g/dL (ref 32.0–36.0)
MCV: 89.7 fL (ref 79.3–98.0)
MONO#: 0.5 10*3/uL (ref 0.1–0.9)
MONO%: 19.8 % — ABNORMAL HIGH (ref 0.0–14.0)
NEUT#: 1 10*3/uL — ABNORMAL LOW (ref 1.5–6.5)
NEUT%: 38.5 % — AB (ref 39.0–75.0)
Platelets: 45 10*3/uL — ABNORMAL LOW (ref 140–400)
RBC: 3.01 10*6/uL — AB (ref 4.20–5.82)
RDW: 15.1 % — AB (ref 11.0–14.6)
WBC: 2.6 10*3/uL — ABNORMAL LOW (ref 4.0–10.3)
lymph#: 1 10*3/uL (ref 0.9–3.3)
nRBC: 0 % (ref 0–0)

## 2013-10-06 MED ORDER — POTASSIUM CHLORIDE CRYS ER 20 MEQ PO TBCR: EXTENDED_RELEASE_TABLET | ORAL | Status: DC

## 2013-10-08 ENCOUNTER — Other Ambulatory Visit: Payer: Self-pay | Admitting: Internal Medicine

## 2013-10-13 ENCOUNTER — Ambulatory Visit: Payer: Medicare Other

## 2013-10-13 ENCOUNTER — Other Ambulatory Visit: Payer: Medicare Other

## 2013-10-15 ENCOUNTER — Ambulatory Visit: Payer: Medicare Other

## 2013-10-15 ENCOUNTER — Telehealth: Payer: Self-pay | Admitting: Medical Oncology

## 2013-10-15 ENCOUNTER — Other Ambulatory Visit: Payer: Medicare Other

## 2013-10-15 ENCOUNTER — Other Ambulatory Visit: Payer: Self-pay | Admitting: Medical Oncology

## 2013-10-15 NOTE — Telephone Encounter (Signed)
1 

## 2013-10-15 NOTE — Telephone Encounter (Signed)
Pt called and states he did not make his appointment this am. He went to [facility] last week and had labs drawn. Monday 2/23 he had a bone marrow biopsy. He has an appointment to got back next Thurs March 5 for results. I explained we will reschedule his appointment to the week of March 9 th. This way he will know if we need to make changes to his treatment plan. Dr.Chism is aware and agrees with rescheduling the pt.

## 2013-10-16 ENCOUNTER — Other Ambulatory Visit: Payer: Self-pay

## 2013-10-16 DIAGNOSIS — C9000 Multiple myeloma not having achieved remission: Secondary | ICD-10-CM

## 2013-10-16 MED ORDER — SIMVASTATIN 20 MG PO TABS: ORAL_TABLET | ORAL | Status: DC

## 2013-10-16 MED ORDER — ATENOLOL-CHLORTHALIDONE 50-25 MG PO TABS: 1.0000 | ORAL_TABLET | Freq: Every morning | ORAL | Status: DC

## 2013-10-16 MED ORDER — ALLOPURINOL 100 MG PO TABS: 100.0000 mg | ORAL_TABLET | Freq: Every day | ORAL | Status: DC

## 2013-10-16 MED ORDER — POTASSIUM CHLORIDE CRYS ER 20 MEQ PO TBCR: EXTENDED_RELEASE_TABLET | ORAL | Status: DC

## 2013-10-16 MED ORDER — AMLODIPINE BESYLATE 5 MG PO TABS: ORAL_TABLET | ORAL | Status: DC

## 2013-10-16 MED ORDER — ESOMEPRAZOLE MAGNESIUM 40 MG PO CPDR: DELAYED_RELEASE_CAPSULE | ORAL | Status: DC

## 2013-10-22 ENCOUNTER — Ambulatory Visit (HOSPITAL_COMMUNITY)
Admission: RE | Admit: 2013-10-22 | Discharge: 2013-10-22 | Disposition: A | Discharge status: Home / Self Care | Payer: Medicare Other | Source: Ambulatory Visit | Attending: Internal Medicine | Admitting: Internal Medicine

## 2013-10-22 ENCOUNTER — Telehealth: Payer: Self-pay | Admitting: Internal Medicine

## 2013-10-22 ENCOUNTER — Telehealth: Payer: Self-pay

## 2013-10-22 ENCOUNTER — Other Ambulatory Visit: Payer: Self-pay | Admitting: Internal Medicine

## 2013-10-22 DIAGNOSIS — C9000 Multiple myeloma not having achieved remission: Secondary | ICD-10-CM | POA: Insufficient documentation

## 2013-10-22 DIAGNOSIS — D649 Anemia, unspecified: Secondary | ICD-10-CM | POA: Insufficient documentation

## 2013-10-22 NOTE — Telephone Encounter (Signed)
Mechele Claude from Dr Erenest Blank office called to state pt's hgb was 7.3 today. They cannot do transfusion today. Asked if Dr Juliann Mule will order transfusion at [facility] tomorrow. Schedule made and pt informed we at [facility] will be able to give him transfusion tomorrow. Pt is at Dr Kendell Bane office and they told him of time.

## 2013-10-22 NOTE — Telephone Encounter (Signed)
added lb/blood for 3/6 @ 830am and 930 am per desk nurse. per desk nurse pt aware. per desk nurse d/t/location per helen.

## 2013-10-23 ENCOUNTER — Other Ambulatory Visit: Payer: Self-pay | Admitting: Medical Oncology

## 2013-10-23 ENCOUNTER — Telehealth: Payer: Self-pay | Admitting: Hematology & Oncology

## 2013-10-23 ENCOUNTER — Other Ambulatory Visit: Payer: Self-pay | Admitting: Internal Medicine

## 2013-10-23 ENCOUNTER — Telehealth: Payer: Self-pay | Admitting: Internal Medicine

## 2013-10-23 ENCOUNTER — Encounter: Payer: Self-pay | Admitting: Medical Oncology

## 2013-10-23 ENCOUNTER — Other Ambulatory Visit (HOSPITAL_BASED_OUTPATIENT_CLINIC_OR_DEPARTMENT_OTHER): Payer: Medicare Other

## 2013-10-23 ENCOUNTER — Ambulatory Visit (HOSPITAL_BASED_OUTPATIENT_CLINIC_OR_DEPARTMENT_OTHER): Payer: Medicare Other

## 2013-10-23 VITALS — BP 110/48 | HR 71 | Temp 98.4°F | Resp 20

## 2013-10-23 DIAGNOSIS — C9000 Multiple myeloma not having achieved remission: Secondary | ICD-10-CM

## 2013-10-23 DIAGNOSIS — D649 Anemia, unspecified: Secondary | ICD-10-CM

## 2013-10-23 LAB — CBC WITH DIFFERENTIAL/PLATELET
BASO%: 0 % (ref 0.0–2.0)
Basophils Absolute: 0 10*3/uL (ref 0.0–0.1)
EOS ABS: 0 10*3/uL (ref 0.0–0.5)
EOS%: 0.6 % (ref 0.0–7.0)
HEMATOCRIT: 20.1 % — AB (ref 38.4–49.9)
HGB: 6.8 g/dL — CL (ref 13.0–17.1)
LYMPH%: 33.7 % (ref 14.0–49.0)
MCH: 30.8 pg (ref 27.2–33.4)
MCHC: 33.8 g/dL (ref 32.0–36.0)
MCV: 91 fL (ref 79.3–98.0)
MONO#: 0.3 10*3/uL (ref 0.1–0.9)
MONO%: 17.4 % — AB (ref 0.0–14.0)
NEUT#: 0.8 10*3/uL — ABNORMAL LOW (ref 1.5–6.5)
NEUT%: 48.3 % (ref 39.0–75.0)
PLATELETS: 24 10*3/uL — AB (ref 140–400)
RBC: 2.21 10*6/uL — AB (ref 4.20–5.82)
RDW: 15.5 % — ABNORMAL HIGH (ref 11.0–14.6)
WBC: 1.7 10*3/uL — AB (ref 4.0–10.3)
lymph#: 0.6 10*3/uL — ABNORMAL LOW (ref 0.9–3.3)
nRBC: 2 % — ABNORMAL HIGH (ref 0–0)

## 2013-10-23 LAB — COMPREHENSIVE METABOLIC PANEL (CC13)
ALBUMIN: 3.7 g/dL (ref 3.5–5.0)
ALT: 10 U/L (ref 0–55)
ANION GAP: 13 meq/L — AB (ref 3–11)
AST: 29 U/L (ref 5–34)
Alkaline Phosphatase: 67 U/L (ref 40–150)
BUN: 14.5 mg/dL (ref 7.0–26.0)
CHLORIDE: 108 meq/L (ref 98–109)
CO2: 21 mEq/L — ABNORMAL LOW (ref 22–29)
Calcium: 10.9 mg/dL — ABNORMAL HIGH (ref 8.4–10.4)
Creatinine: 1.6 mg/dL — ABNORMAL HIGH (ref 0.7–1.3)
Glucose: 130 mg/dl (ref 70–140)
Potassium: 3.7 mEq/L (ref 3.5–5.1)
Sodium: 142 mEq/L (ref 136–145)
Total Bilirubin: 0.86 mg/dL (ref 0.20–1.20)
Total Protein: 7.1 g/dL (ref 6.4–8.3)

## 2013-10-23 LAB — LACTATE DEHYDROGENASE (CC13): LDH: 390 U/L — AB (ref 125–245)

## 2013-10-23 LAB — PREPARE RBC (CROSSMATCH)

## 2013-10-23 LAB — ABO/RH: ABO/RH(D): O POS

## 2013-10-23 MED ORDER — DIPHENHYDRAMINE HCL 25 MG PO CAPS
25.0000 mg | ORAL_CAPSULE | Freq: Once | ORAL | Status: AC
  Administered 2013-10-23: 25 mg via ORAL

## 2013-10-23 MED ORDER — ACETAMINOPHEN 325 MG PO TABS
ORAL_TABLET | ORAL | Status: AC
  Filled 2013-10-23: qty 2

## 2013-10-23 MED ORDER — DIPHENHYDRAMINE HCL 25 MG PO CAPS
ORAL_CAPSULE | ORAL | Status: AC
  Filled 2013-10-23: qty 1

## 2013-10-23 MED ORDER — SODIUM CHLORIDE 0.9 % IV SOLN
250.0000 mL | Freq: Once | INTRAVENOUS | Status: AC
  Administered 2013-10-23: 250 mL via INTRAVENOUS

## 2013-10-23 MED ORDER — ACETAMINOPHEN 325 MG PO TABS
650.0000 mg | ORAL_TABLET | Freq: Once | ORAL | Status: AC
  Administered 2013-10-23: 650 mg via ORAL

## 2013-10-23 MED ORDER — DEXAMETHASONE 4 MG PO TABS: ORAL_TABLET | ORAL | Status: DC

## 2013-10-23 NOTE — Telephone Encounter (Signed)
per 3/6 pof pt to have tx 3/9 and 3/10. message to chemo scheduler and copied to provider and desk nurse re tx 3/9 and 3/10. lm for pt to expect a call from me monday 3/9 re appt 3/9 and 3/10 or some time next wk.

## 2013-10-23 NOTE — Telephone Encounter (Signed)
RECEIVED CALL FROM WIFE SAYING PT WAS TO START TX HERE NEXT WEEK. tRANSFERRED TO rn TO TRIAGE

## 2013-10-23 NOTE — Patient Instructions (Signed)
Blood Transfusion  A blood transfusion replaces your blood or some of its parts. Blood is replaced when you have lost blood because of surgery, an accident, or for severe blood conditions like anemia. You can donate blood to be used on yourself if you have a planned surgery. If you lose blood during that surgery, your own blood can be given back to you. Any blood given to you is checked to make sure it matches your blood type. Your temperature, blood pressure, and heart rate (vital signs) will be checked often.  GET HELP RIGHT AWAY IF:   You feel sick to your stomach (nauseous) or throw up (vomit).  You have watery poop (diarrhea).  You have shortness of breath or trouble breathing.  You have blood in your pee (urine) or have dark colored pee.  You have chest pain or tightness.  Your eyes or skin turn yellow (jaundice).  You have a temperature by mouth above 102 F (38.9 C), not controlled by medicine.  You start to shake and have chills.  You develop a a red rash (hives) or feel itchy.  You develop lightheadedness or feel confused.  You develop back, joint, or muscle pain.  You do not feel hungry (lost appetite).  You feel tired, restless, or nervous.  You develop belly (abdominal) cramps. Document Released: 11/02/2008 Document Revised: 10/29/2011 Document Reviewed: 11/02/2008 Exit[employer] Patient Information 2014 [employer].  

## 2013-10-24 LAB — TYPE AND SCREEN
ABO/RH(D): O POS
Antibody Screen: NEGATIVE
Unit division: 0
Unit division: 0

## 2013-10-26 ENCOUNTER — Other Ambulatory Visit: Payer: Self-pay | Admitting: Medical Oncology

## 2013-10-26 ENCOUNTER — Encounter: Payer: Self-pay | Admitting: Medical Oncology

## 2013-10-26 ENCOUNTER — Telehealth: Payer: Self-pay | Admitting: Medical Oncology

## 2013-10-26 ENCOUNTER — Telehealth: Payer: Self-pay | Admitting: Internal Medicine

## 2013-10-26 ENCOUNTER — Other Ambulatory Visit: Payer: Self-pay | Admitting: Hematology & Oncology

## 2013-10-26 MED ORDER — LENALIDOMIDE 15 MG PO CAPS: ORAL_CAPSULE | ORAL | Status: DC

## 2013-10-26 NOTE — Telephone Encounter (Signed)
lmonvm for pt re appts for 3/10, 3/11 and 3/13.

## 2013-10-26 NOTE — Telephone Encounter (Signed)
Pt had a question about his dexamethasone 40 mg. He is not sure when he should take it. I explained that he will get IV decadron before his treatment so he can take the 10 tablets that evening. I have made the pt and his wife a calender with D 1,D 8, D 15 and D 22. I will give this to them tomorrow when he comes for chemo. I also asked her to call me when he finishes his revlimid se we can reorder. She voiced understanding.

## 2013-10-27 ENCOUNTER — Other Ambulatory Visit: Payer: Self-pay | Admitting: Internal Medicine

## 2013-10-27 ENCOUNTER — Ambulatory Visit (HOSPITAL_BASED_OUTPATIENT_CLINIC_OR_DEPARTMENT_OTHER): Payer: Medicare Other

## 2013-10-27 ENCOUNTER — Other Ambulatory Visit (HOSPITAL_BASED_OUTPATIENT_CLINIC_OR_DEPARTMENT_OTHER): Payer: Medicare Other

## 2013-10-27 VITALS — BP 114/49 | HR 70 | Temp 98.4°F | Resp 20

## 2013-10-27 DIAGNOSIS — C9000 Multiple myeloma not having achieved remission: Secondary | ICD-10-CM

## 2013-10-27 DIAGNOSIS — Z5112 Encounter for antineoplastic immunotherapy: Secondary | ICD-10-CM

## 2013-10-27 LAB — CBC WITH DIFFERENTIAL/PLATELET
BASO%: 1.3 % (ref 0.0–2.0)
Basophils Absolute: 0 10*3/uL (ref 0.0–0.1)
EOS%: 1.3 % (ref 0.0–7.0)
Eosinophils Absolute: 0 10*3/uL (ref 0.0–0.5)
HCT: 25.5 % — ABNORMAL LOW (ref 38.4–49.9)
HGB: 8.6 g/dL — ABNORMAL LOW (ref 13.0–17.1)
LYMPH#: 0.4 10*3/uL — AB (ref 0.9–3.3)
LYMPH%: 26.5 % (ref 14.0–49.0)
MCH: 30.2 pg (ref 27.2–33.4)
MCHC: 33.7 g/dL (ref 32.0–36.0)
MCV: 89.5 fL (ref 79.3–98.0)
MONO#: 0.3 10*3/uL (ref 0.1–0.9)
MONO%: 20.5 % — ABNORMAL HIGH (ref 0.0–14.0)
NEUT#: 0.8 10*3/uL — ABNORMAL LOW (ref 1.5–6.5)
NEUT%: 50.4 % (ref 39.0–75.0)
Platelets: 23 10*3/uL — ABNORMAL LOW (ref 140–400)
RBC: 2.85 10*6/uL — ABNORMAL LOW (ref 4.20–5.82)
RDW: 15.4 % — ABNORMAL HIGH (ref 11.0–14.6)
WBC: 1.5 10*3/uL — AB (ref 4.0–10.3)
nRBC: 1 % — ABNORMAL HIGH (ref 0–0)

## 2013-10-27 LAB — BASIC METABOLIC PANEL (CC13)
ANION GAP: 12 meq/L — AB (ref 3–11)
BUN: 15.2 mg/dL (ref 7.0–26.0)
CALCIUM: 10.6 mg/dL — AB (ref 8.4–10.4)
CO2: 22 mEq/L (ref 22–29)
Chloride: 107 mEq/L (ref 98–109)
Creatinine: 1.5 mg/dL — ABNORMAL HIGH (ref 0.7–1.3)
GLUCOSE: 101 mg/dL (ref 70–140)
Potassium: 3.9 mEq/L (ref 3.5–5.1)
SODIUM: 140 meq/L (ref 136–145)

## 2013-10-27 LAB — TECHNOLOGIST REVIEW: Technologist Review: 2

## 2013-10-27 MED ORDER — DEXAMETHASONE SODIUM PHOSPHATE 10 MG/ML IJ SOLN
INTRAMUSCULAR | Status: AC
  Filled 2013-10-27: qty 1

## 2013-10-27 MED ORDER — SODIUM CHLORIDE 0.9 % IV SOLN
Freq: Once | INTRAVENOUS | Status: AC
  Administered 2013-10-27: 12:00:00 via INTRAVENOUS

## 2013-10-27 MED ORDER — DEXAMETHASONE SODIUM PHOSPHATE 10 MG/ML IJ SOLN
10.0000 mg | Freq: Once | INTRAMUSCULAR | Status: AC
  Administered 2013-10-27: 10 mg via INTRAVENOUS

## 2013-10-27 MED ORDER — ONDANSETRON 8 MG/50ML IVPB (CHCC)
8.0000 mg | Freq: Once | INTRAVENOUS | Status: AC
  Administered 2013-10-27: 8 mg via INTRAVENOUS

## 2013-10-27 MED ORDER — ONDANSETRON 8 MG/NS 50 ML IVPB
INTRAVENOUS | Status: AC
  Filled 2013-10-27: qty 8

## 2013-10-27 MED ORDER — DEXTROSE 5 % IV SOLN
20.0000 mg/m2 | Freq: Once | INTRAVENOUS | Status: AC
  Administered 2013-10-27: 38 mg via INTRAVENOUS
  Filled 2013-10-27: qty 19

## 2013-10-27 NOTE — Patient Instructions (Addendum)
[facility] Discharge Instructions for Patients Receiving Chemotherapy  Today you received the following chemotherapy agents: Kyprolis  To help prevent nausea and vomiting after your treatment, we encourage you to take your nausea medication as prescribed.   If you develop nausea and vomiting that is not controlled by your nausea medication, call the clinic.   BELOW ARE SYMPTOMS THAT SHOULD BE REPORTED IMMEDIATELY:  *FEVER GREATER THAN 100.5 F  *CHILLS WITH OR WITHOUT FEVER  NAUSEA AND VOMITING THAT IS NOT CONTROLLED WITH YOUR NAUSEA MEDICATION  *UNUSUAL SHORTNESS OF BREATH  *UNUSUAL BRUISING OR BLEEDING  TENDERNESS IN MOUTH AND THROAT WITH OR WITHOUT PRESENCE OF ULCERS  *URINARY PROBLEMS  *BOWEL PROBLEMS  UNUSUAL RASH Items with * indicate a potential emergency and should be followed up as soon as possible.  Feel free to call the clinic you have any questions or concerns. The clinic phone number is [phone].   Carfilzomib injection What is this medicine? CARFILZOMIB (kar FILZ oh mib) is a chemotherapy drug that works by slowing or stopping cancer cell growth. This medicine is used to treat multiple myeloma. This medicine may be used for other purposes; ask your health care provider or pharmacist if you have questions. COMMON BRAND NAME(S): KYPROLIS What should I tell my health care provider before I take this medicine? They need to know if you have any of these conditions: -heart disease -irregular heartbeat -liver disease -lung or breathing disease -an unusual or allergic reaction to carfilzomib, or other medicines, foods, dyes, or preservatives -pregnant or trying to get pregnant -breast-feeding How should I use this medicine? This medicine is for injection or infusion into a vein. It is given by a health care professional in a hospital or clinic setting. Talk to your pediatrician regarding the use of this medicine in children. Special care may  be needed. Overdosage: If you think you've taken too much of this medicine contact a poison control center or emergency room at once. Overdosage: If you think you have taken too much of this medicine contact a poison control center or emergency room at once. NOTE: This medicine is only for you. Do not share this medicine with others. What if I miss a dose? It is important not to miss your dose. Call your doctor or health care professional if you are unable to keep an appointment. What may interact with this medicine? Interactions are not expected. Give your health care provider a list of all the medicines, herbs, non-prescription drugs, or dietary supplements you use. Also tell them if you smoke, drink alcohol, or use illegal drugs. Some items may interact with your medicine. This list may not describe all possible interactions. Give your health care provider a list of all the medicines, herbs, non-prescription drugs, or dietary supplements you use. Also tell them if you smoke, drink alcohol, or use illegal drugs. Some items may interact with your medicine. What should I watch for while using this medicine? Your condition will be monitored carefully while you are receiving this medicine. Report any side effects. Continue your course of treatment even though you feel ill unless your doctor tells you to stop. Call your doctor or health care professional for advice if you get a fever, chills or sore throat, or other symptoms of a cold or flu. Do not treat yourself. Try to avoid being around people who are sick. Do not become pregnant while taking this medicine. Women should inform their doctor if they wish to become pregnant or  think they might be pregnant. There is a potential for serious side effects to an unborn child. Talk to your health care professional or pharmacist for more information. Do not breast-feed an infant while taking this medicine. Check with your doctor or health care professional if  you get an attack of severe diarrhea, nausea and vomiting, or if you sweat a lot. The loss of too much body fluid can make it dangerous for you to take this medicine. You may get dizzy. Do not drive, use machinery, or do anything that needs mental alertness until you know how this medicine affects you. Do not stand or sit up quickly, especially if you are an older patient. This reduces the risk of dizzy or fainting spells. What side effects may I notice from receiving this medicine? Side effects that you should report to your doctor or health care professional as soon as possible: -allergic reactions like skin rash, itching or hives, swelling of the face, lips, or tongue -breathing problems -chest pain or palpitationschest tightness -cough -dark urine -dizziness -feeling faint or lightheaded -fever or chills -general ill feeling or flu-like symptoms -light-colored stools -palpitations -right upper belly pain -swelling of the legs or ankles -unusual bleeding or bruising -unusually weak or tired -yellowing of the eyes or skin  Side effects that usually do not require medical attention (Report these to your doctor or health care professional if they continue or are bothersome.): -diarrhea -headache -nausea, vomiting -tiredness This list may not describe all possible side effects. Call your doctor for medical advice about side effects. You may report side effects to FDA at 1-800-FDA-1088. Where should I keep my medicine? This drug is given in a hospital or clinic and will not be stored at home. NOTE: This sheet is a summary. It may not cover all possible information. If you have questions about this medicine, talk to your doctor, pharmacist, or health care provider.  2014, Elsevier/Gold Standard. (2012-01-25 17:02:29)

## 2013-10-27 NOTE — Progress Notes (Signed)
Medical oncology late entry Note:  I saw patient this past Friday, 10/23/2013, while he was receiving his 2 units of packed RBC.   He had a bone marrow biopsy at [facility] with Dr. Gasperetto on 10/12/2013 and it was consistent with progressive disease.   She recommended that he start calfilzomib in combination with lenalidomide and low-dose desamethasone.   I discussed the recommendations in detail with the patient and his wife.  He had prior authorization for his lenalidomide.  We plan on starting chemotherapy on 10/27/2013.   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  Induction will consists of the following:    Carfilzomib 20 mg/m2 IV on days 1, 2, 8, 9, 15, 16 every 28 days;   Lenalidomide 10 mg on day 1-21 of all cycles; (We plan to titrate up 25 mg based on his count recovery);  Dexamethasone 40 mg for cycles 1-4 weekly .    Side effects including but not limited to neuropathy, nausea, vomiting, diarrhea, hyperglycemia, maculopapular rash, fatigue, myelosuppression including risk of life threatening infections and bleeding.   He understood the risks and benefits and indications for therapy and was agreeable to proceed.  Prescriptions for dexamethasone and anti-emetics were provided.  He is aware to report any fever over 100.5 to medical oncologist on-call or report to the nearest ER.  We will follow his immunoglobins, M-spike and UPEP plus immunofixation for each cycle while on therapy.   

## 2013-10-28 ENCOUNTER — Other Ambulatory Visit: Payer: Self-pay | Admitting: Internal Medicine

## 2013-10-28 ENCOUNTER — Other Ambulatory Visit: Payer: Self-pay | Admitting: *Deleted

## 2013-10-28 ENCOUNTER — Ambulatory Visit (HOSPITAL_BASED_OUTPATIENT_CLINIC_OR_DEPARTMENT_OTHER): Payer: Medicare Other

## 2013-10-28 ENCOUNTER — Other Ambulatory Visit (HOSPITAL_BASED_OUTPATIENT_CLINIC_OR_DEPARTMENT_OTHER): Payer: Medicare Other

## 2013-10-28 VITALS — BP 114/56 | HR 92 | Temp 97.0°F | Resp 20

## 2013-10-28 DIAGNOSIS — C9000 Multiple myeloma not having achieved remission: Secondary | ICD-10-CM

## 2013-10-28 DIAGNOSIS — Z5112 Encounter for antineoplastic immunotherapy: Secondary | ICD-10-CM

## 2013-10-28 LAB — BASIC METABOLIC PANEL (CC13)
ANION GAP: 14 meq/L — AB (ref 3–11)
BUN: 22.9 mg/dL (ref 7.0–26.0)
CALCIUM: 10.1 mg/dL (ref 8.4–10.4)
CO2: 21 meq/L — AB (ref 22–29)
Chloride: 106 mEq/L (ref 98–109)
Creatinine: 1.6 mg/dL — ABNORMAL HIGH (ref 0.7–1.3)
Glucose: 135 mg/dl (ref 70–140)
Potassium: 3.7 mEq/L (ref 3.5–5.1)
SODIUM: 141 meq/L (ref 136–145)

## 2013-10-28 MED ORDER — PROCHLORPERAZINE MALEATE 10 MG PO TABS: 10.0000 mg | ORAL_TABLET | Freq: Four times a day (QID) | ORAL | Status: DC | PRN

## 2013-10-28 MED ORDER — SODIUM CHLORIDE 0.9 % IV SOLN
Freq: Once | INTRAVENOUS | Status: AC
  Administered 2013-10-28: 20 mL via INTRAVENOUS

## 2013-10-28 MED ORDER — DEXAMETHASONE SODIUM PHOSPHATE 10 MG/ML IJ SOLN
INTRAMUSCULAR | Status: AC
  Filled 2013-10-28: qty 1

## 2013-10-28 MED ORDER — ONDANSETRON 8 MG/50ML IVPB (CHCC)
8.0000 mg | Freq: Once | INTRAVENOUS | Status: AC
  Administered 2013-10-28: 8 mg via INTRAVENOUS

## 2013-10-28 MED ORDER — ONDANSETRON 8 MG/NS 50 ML IVPB
INTRAVENOUS | Status: AC
  Filled 2013-10-28: qty 8

## 2013-10-28 MED ORDER — SODIUM CHLORIDE 0.9 % IV SOLN
Freq: Once | INTRAVENOUS | Status: AC
  Administered 2013-10-28: 16:00:00 via INTRAVENOUS

## 2013-10-28 MED ORDER — DEXTROSE 5 % IV SOLN
20.0000 mg/m2 | Freq: Once | INTRAVENOUS | Status: AC
  Administered 2013-10-28: 38 mg via INTRAVENOUS
  Filled 2013-10-28: qty 19

## 2013-10-28 MED ORDER — DEXAMETHASONE SODIUM PHOSPHATE 10 MG/ML IJ SOLN
10.0000 mg | Freq: Once | INTRAMUSCULAR | Status: AC
  Administered 2013-10-28: 10 mg via INTRAVENOUS

## 2013-10-28 MED ORDER — ONDANSETRON HCL 8 MG PO TABS: 8.0000 mg | ORAL_TABLET | Freq: Three times a day (TID) | ORAL | Status: DC | PRN

## 2013-10-28 NOTE — Progress Notes (Signed)
Counts low from yesterday, discussed with Robin RN, Dr. Juliann Mule ordered okay to treat yesterday with low counts. Dr. Juliann Mule singed day 2 of order today. Hl

## 2013-10-28 NOTE — Patient Instructions (Addendum)
[place] Discharge Instructions for Patients Receiving Chemotherapy  Today you received the following chemotherapy agents Kyprolis  To help prevent nausea and vomiting after your treatment, we encourage you to take your nausea medication as per Dr. Juliann Mule.(Zofran and Compazine)   If you develop nausea and vomiting that is not controlled by your nausea medication, call the clinic.   BELOW ARE SYMPTOMS THAT SHOULD BE REPORTED IMMEDIATELY:  *FEVER GREATER THAN 100.5 F  *CHILLS WITH OR WITHOUT FEVER  NAUSEA AND VOMITING THAT IS NOT CONTROLLED WITH YOUR NAUSEA MEDICATION  *UNUSUAL SHORTNESS OF BREATH  *UNUSUAL BRUISING OR BLEEDING  TENDERNESS IN MOUTH AND THROAT WITH OR WITHOUT PRESENCE OF ULCERS  *URINARY PROBLEMS  *BOWEL PROBLEMS  UNUSUAL RASH Items with * indicate a potential emergency and should be followed up as soon as possible.  Feel free to call the clinic you have any questions or concerns. The clinic phone number is [phone].

## 2013-10-29 ENCOUNTER — Telehealth: Payer: Self-pay | Admitting: *Deleted

## 2013-10-29 ENCOUNTER — Other Ambulatory Visit: Payer: Self-pay | Admitting: Internal Medicine

## 2013-10-29 DIAGNOSIS — C9000 Multiple myeloma not having achieved remission: Secondary | ICD-10-CM

## 2013-10-29 NOTE — Telephone Encounter (Signed)
Spoke with pt today for post chemo follow up call.  Pt stated he could feel some improvement in overall status.  Pt stated appetite better, and drinking lots of fluids as tolerated.  Denied nausea/vomiting.  Has not had bowel movement today, but pt wanted to know what to do to prevent constipation.  Gave pt instructions on dietary intake- prune and prune juice, applesauce.  Instructed pt to take Senokot-S or Colace OTC to help with constipation.  Pt stated he would continue to drink lots of water as tolerated.  Denied pain; bladder functions fine.  Pt aware of next return appt on 10/30/13. Pt stated he had good experience with first chemo treatment.  Staff were courteous, and provided explanations to pt throughout treatment.  No other concerns at this time.

## 2013-10-30 ENCOUNTER — Other Ambulatory Visit (HOSPITAL_BASED_OUTPATIENT_CLINIC_OR_DEPARTMENT_OTHER): Payer: Medicare Other

## 2013-10-30 ENCOUNTER — Telehealth: Payer: Self-pay | Admitting: Internal Medicine

## 2013-10-30 ENCOUNTER — Ambulatory Visit (HOSPITAL_BASED_OUTPATIENT_CLINIC_OR_DEPARTMENT_OTHER): Payer: Medicare Other | Admitting: Internal Medicine

## 2013-10-30 VITALS — BP 142/60 | HR 75 | Temp 97.5°F | Resp 20 | Ht 69.0 in | Wt 168.9 lb

## 2013-10-30 DIAGNOSIS — R197 Diarrhea, unspecified: Secondary | ICD-10-CM

## 2013-10-30 DIAGNOSIS — D61818 Other pancytopenia: Secondary | ICD-10-CM

## 2013-10-30 DIAGNOSIS — E876 Hypokalemia: Secondary | ICD-10-CM

## 2013-10-30 DIAGNOSIS — C9002 Multiple myeloma in relapse: Secondary | ICD-10-CM

## 2013-10-30 DIAGNOSIS — C9000 Multiple myeloma not having achieved remission: Secondary | ICD-10-CM

## 2013-10-30 DIAGNOSIS — Z9484 Stem cells transplant status: Secondary | ICD-10-CM

## 2013-10-30 LAB — COMPREHENSIVE METABOLIC PANEL (CC13)
ALT: 19 U/L (ref 0–55)
AST: 34 U/L (ref 5–34)
Albumin: 3.4 g/dL — ABNORMAL LOW (ref 3.5–5.0)
Alkaline Phosphatase: 71 U/L (ref 40–150)
Anion Gap: 11 mEq/L (ref 3–11)
BILIRUBIN TOTAL: 0.39 mg/dL (ref 0.20–1.20)
BUN: 27.6 mg/dL — ABNORMAL HIGH (ref 7.0–26.0)
CHLORIDE: 103 meq/L (ref 98–109)
CO2: 23 mEq/L (ref 22–29)
Calcium: 8.1 mg/dL — ABNORMAL LOW (ref 8.4–10.4)
Creatinine: 1.2 mg/dL (ref 0.7–1.3)
Glucose: 101 mg/dl (ref 70–140)
Potassium: 3 mEq/L — CL (ref 3.5–5.1)
Sodium: 137 mEq/L (ref 136–145)
Total Protein: 6.3 g/dL — ABNORMAL LOW (ref 6.4–8.3)

## 2013-10-30 LAB — CBC WITH DIFFERENTIAL/PLATELET
BASO%: 0.5 % (ref 0.0–2.0)
Basophils Absolute: 0 10*3/uL (ref 0.0–0.1)
EOS%: 0 % (ref 0.0–7.0)
Eosinophils Absolute: 0 10*3/uL (ref 0.0–0.5)
HEMATOCRIT: 25.2 % — AB (ref 38.4–49.9)
HGB: 8.7 g/dL — ABNORMAL LOW (ref 13.0–17.1)
LYMPH#: 0.5 10*3/uL — AB (ref 0.9–3.3)
LYMPH%: 23 % (ref 14.0–49.0)
MCH: 31 pg (ref 27.2–33.4)
MCHC: 34.5 g/dL (ref 32.0–36.0)
MCV: 89.7 fL (ref 79.3–98.0)
MONO#: 0.3 10*3/uL (ref 0.1–0.9)
MONO%: 12.2 % (ref 0.0–14.0)
NEUT#: 1.4 10*3/uL — ABNORMAL LOW (ref 1.5–6.5)
NEUT%: 64.3 % (ref 39.0–75.0)
NRBC: 3 % — AB (ref 0–0)
PLATELETS: 16 10*3/uL — AB (ref 140–400)
RBC: 2.81 10*6/uL — AB (ref 4.20–5.82)
RDW: 15.5 % — ABNORMAL HIGH (ref 11.0–14.6)
WBC: 2.1 10*3/uL — AB (ref 4.0–10.3)

## 2013-10-30 LAB — TECHNOLOGIST REVIEW

## 2013-10-30 LAB — LACTATE DEHYDROGENASE (CC13): LDH: 415 U/L — AB (ref 125–245)

## 2013-10-30 MED ORDER — LOPERAMIDE HCL 2 MG PO CAPS: 2.0000 mg | ORAL_CAPSULE | ORAL | Status: DC | PRN

## 2013-10-30 MED ORDER — POTASSIUM CHLORIDE CRYS ER 20 MEQ PO TBCR: EXTENDED_RELEASE_TABLET | ORAL | Status: DC

## 2013-10-30 NOTE — Telephone Encounter (Signed)
Talked to pt and gave him appt for MD and chemo he will check with nurse on monday regarding labs

## 2013-10-30 NOTE — Patient Instructions (Signed)
Thrombocytopenia Thrombocytopenia is a condition in which there is an abnormally small number of platelets in your blood. Platelets are also called thrombocytes. Platelets are needed for blood clotting. CAUSES Thrombocytopenia is caused by:   Decreased production of platelets. This can be caused by:  Aplastic anemia in which your bone marrow quits making blood cells.  Cancer in the bone marrow.  Use of certain medicines, including chemotherapy.  Infection in the bone marrow.  Heavy alcohol consumption.  Increased destruction of platelets. This can be caused by:  Certain immune diseases.  Use of certain drugs.  Certain blood clotting disorders.  Certain inherited disorders.  Certain bleeding disorders.  Pregnancy.  Having an enlarged spleen (hypersplenism). In hypersplenism, the spleen gathers up platelets from circulation. This means the platelets are not available to help with blood clotting. The spleen can enlarge due to cirrhosis or other conditions. SYMPTOMS  The symptoms of thrombocytopenia are side effects of poor blood clotting. Some of these are:  Abnormal bleeding.  Nosebleeds.  Heavy menstrual periods.  Blood in the urine or stools.  Purpura. This is a purplish discoloration in the skin produced by small bleeding vessels near the surface of the skin.  Bruising.  A rash that may be petechial. This looks like pinpoint, purplish-red spots on the skin and mucous membranes. It is caused by bleeding from small blood vessels (capillaries). DIAGNOSIS  Your caregiver will make this diagnosis based on your exam and blood tests. Sometimes, a bone marrow study is done to look for the original cells (megakaryocytes) that make platelets. TREATMENT  Treatment depends on the cause of the condition.  Medicines may be given to help protect your platelets from being destroyed.  In some cases, a replacement (transfusion) of platelets may be required to stop or prevent  bleeding.  Sometimes, the spleen must be surgically removed. HOME CARE INSTRUCTIONS   Check the skin and linings inside your mouth for bruising or bleeding as directed by your caregiver.  Check your sputum, urine, and stool for blood as directed by your caregiver.  Do not return to any activities that could cause bumps or bruises until your caregiver says it is okay.  Take extra care not to cut yourself when shaving or when using scissors, needles, knives, and other tools.  Take extra care not to burn yourself when ironing or cooking.  Ask your caregiver if it is okay for you to drink alcohol.  Only take over-the-counter or prescription medicines as directed by your caregiver.  Notify all your caregivers, including dentists and eye doctors, about your condition. SEEK IMMEDIATE MEDICAL CARE IF:   You develop active bleeding from anywhere in your body.  You develop unexplained bruising or bleeding.  You have blood in your sputum, urine, or stool. MAKE SURE YOU:  Understand these instructions.  Will watch your condition.  Will get help right away if you are not doing well or get worse. Document Released: 08/06/2005 Document Revised: 10/29/2011 Document Reviewed: 06/08/2011 [facility] Patient Information 2014 [place], [place]. Neutropenia Neutropenia is a condition that occurs when the level of a certain type of white blood cell (neutrophil) in your body becomes lower than normal. Neutrophils are made in the bone marrow and fight infections. These cells protect against bacteria and viruses. The fewer neutrophils you have, and the longer your body remains without them, the greater your risk of getting a severe infection becomes. CAUSES  The cause of neutropenia may be hard to determine. However, it is usually due to  3 main problems:   Decreased production of neutrophils. This may be due to:  Certain medicines such as chemotherapy.  Genetic problems.  Cancer.  Radiation  treatments.  Vitamin deficiency.  Some pesticides.  Increased destruction of neutrophils. This may be due to:  Overwhelming infections.  Hemolytic anemia. This is when the body destroys its own blood cells.  Chemotherapy.  Neutrophils moving to areas of the body where they cannot fight infections. This may be due to:  Dialysis procedures.  Conditions where the spleen becomes enlarged. Neutrophils are held in the spleen and are not available to the rest of the body.  Overwhelming infections. The neutrophils are held in the area of the infection and are not available to the rest of the body. SYMPTOMS  There are no specific symptoms of neutropenia. The lack of neutrophils can result in an infection, and an infection can cause various problems. DIAGNOSIS  Diagnosis is made by a blood test. A complete blood count is performed. The normal level of neutrophils in human blood differs with age and race. Infants have lower counts than older children and adults. African Americans have lower counts than Caucasians or Asians. The average adult level is 1500 cells/mm3 of blood. Neutrophil counts are interpreted as follows:  Greater than 1000 cells/mm3 gives normal protection against infection.  500 to 1000 cells/mm3 gives an increased risk for infection.  200 to 500 cells/mm3 is a greater risk for severe infection.  Lower than 200 cells/mm3 is a marked risk of infection. This may require hospitalization and treatment with antibiotic medicines. TREATMENT  Treatment depends on the underlying cause, severity, and presence of infections or symptoms. It also depends on your health. Your caregiver will discuss the treatment plan with you. Mild cases are often easily treated and have a good outcome. Preventative measures may also be started to limit your risk of infections. Treatment can include:  Taking antibiotics.  Stopping medicines that are known to cause neutropenia.  Correcting nutritional  deficiencies by eating green vegetables to supply folic acid and taking vitamin B supplements.  Stopping exposure to pesticides if your neutropenia is related to pesticide exposure.  Taking a blood growth factor called sargramostim, pegfilgrastim, or filgrastim if you are undergoing chemotherapy for cancer. This stimulates white blood cell production.  Removal of the spleen if you have Felty's syndrome and have repeated infections. HOME CARE INSTRUCTIONS   Follow your caregiver's instructions about when you need to have blood work done.  Wash your hands often. Make sure others who come in contact with you also wash their hands.  Wash raw fruits and vegetables before eating them. They can carry bacteria and fungi.  Avoid people with colds or spreadable (contagious) diseases (chickenpox, herpes zoster, influenza).  Avoid large crowds.  Avoid construction areas. The dust can release fungus into the air.  Be cautious around children in daycare or school environments.  Take care of your respiratory system by coughing and deep breathing.  Bathe daily.  Protect your skin from cuts and burns.  Do not work in the garden or with flowers and plants.  Care for the mouth before and after meals by brushing with a soft toothbrush. If you have mucositis, do not use mouthwash. Mouthwash contains alcohol and can dry out the mouth even more.  Clean the area between the genitals and the anus (perineal area) after urination and bowel movements. Women need to wipe from front to back.  Use a water soluble lubricant during sexual intercourse and  practice good hygiene after. Do not have intercourse if you are severely neutropenic. Check with your caregiver for guidelines.  Exercise daily as tolerated.  Avoid people who were vaccinated with a live vaccine in the past 30 days. You should not receive live vaccines (polio, typhoid).  Do not provide direct care for pets. Avoid animal droppings. Do not  clean litter boxes and bird cages.  Do not share food utensils.  Do not use tampons, enemas, or rectal suppositories unless directed by your caregiver.  Use an electric razor to remove hair.  Wash your hands after handling magazines, letters, and newspapers. SEEK IMMEDIATE MEDICAL CARE IF:   You have a fever.  You have chills or start to shake.  You feel nauseous or vomit.  You develop mouth sores.  You develop aches and pains.  You have redness and swelling around open wounds.  Your skin is warm to the touch.  You have pus coming from your wounds.  You develop swollen lymph nodes.  You feel weak or fatigued.  You develop red streaks on the skin. MAKE SURE YOU:  Understand these instructions.  Will watch your condition.  Will get help right away if you are not doing well or get worse. Document Released: 01/26/2002 Document Revised: 10/29/2011 Document Reviewed: 02/23/2011 [facility] Patient Information 2014 [place], [place]. Loperamide tablets or capsules What is this medicine? LOPERAMIDE (loe PER a mide) is used to treat diarrhea. This medicine may be used for other purposes; ask your health care provider or pharmacist if you have questions. COMMON BRAND NAME(S): Anti-Diarrheal, Imodium A-D, K-Pek II What should I tell my health care provider before I take this medicine? They need to know if you have any of these conditions: -a black or bloody stool -bacterial food poisoning -colitis or mucus in your stool -currently taking an antibiotic medication for an infection -fever -liver disease -severe abdominal pain, swelling or bulging -an unusual or allergic reaction to loperamide, other medicines, foods, dyes, or preservatives -pregnant or trying to get pregnant -breast-feeding How should I use this medicine? Take this medicine by mouth with a glass of water. Follow the directions on the prescription label. Take your doses at regular intervals. Do not take your  medicine more often than directed. Talk to your pediatrician regarding the use of this medicine in children. Special care may be needed. Overdosage: If you think you have taken too much of this medicine contact a poison control center or emergency room at once. NOTE: This medicine is only for you. Do not share this medicine with others. What if I miss a dose? This does not apply. This medicine is not for regular use. Only take this medicine while you continue to have loose bowel movements. Do not take more medicine than recommended by the packaging label or by your healthcare professional. What may interact with this medicine? Do not take this medicine with any of the following medications: -alosetron This medicine may also interact with the following medications: -quinidine -ritonavir -saquinavir This list may not describe all possible interactions. Give your health care provider a list of all the medicines, herbs, non-prescription drugs, or dietary supplements you use. Also tell them if you smoke, drink alcohol, or use illegal drugs. Some items may interact with your medicine. What should I watch for while using this medicine? Do not take this medicine for more than 1 week without asking your doctor or health care professional. If your symptoms do not start to get better after two days,  you may have a problem that needs further evaluation. Check with your doctor or health care professional right away if you develop a fever, severe abdominal pain, swelling or bulging, or if you have have bloody/black diarrhea or stools. You may get drowsy or dizzy. Do not drive, use machinery, or do anything that needs mental alertness until you know how this medicine affects you. Do not stand or sit up quickly, especially if you are an older patient. This reduces the risk of dizzy or fainting spells. Alcohol can increase possible drowsiness and dizziness. Avoid alcoholic drinks. Your mouth may get dry. Chewing  sugarless gum or sucking hard candy, and drinking plenty of water may help. Contact your doctor if the problem does not go away or is severe. Drinking plenty of water can also help prevent dehydration that can occur with diarrhea. Elderly patients may have a more variable response to the effects of this medicine, and are more susceptible to the effects of dehydration. What side effects may I notice from receiving this medicine? Side effects that you should report to your doctor or health care professional as soon as possible: -allergic reactions like skin rash, itching or hives, swelling of the face, lips, or tongue -bloated, swollen feeling in your abdomen -blurred vision -loss of appetite -stomach pain Side effects that usually do not require medical attention (report to your doctor or health care professional if they continue or are bothersome): -constipation -drowsiness or dizziness -dry mouth -nausea, vomiting This list may not describe all possible side effects. Call your doctor for medical advice about side effects. You may report side effects to FDA at 1-800-FDA-1088. Where should I keep my medicine? Keep out of the reach of children. Store at room temperature between 15 and 25 degrees C (59 and 77 degrees F). Keep container tightly closed. Throw away any unused medicine after the expiration date. NOTE: This sheet is a summary. It may not cover all possible information. If you have questions about this medicine, talk to your doctor, pharmacist, or health care provider.  2014, [employer]. (2008-02-10 16:02:13) Diarrhea Diarrhea is frequent loose and watery bowel movements. It can cause you to feel weak and dehydrated. Dehydration can cause you to become tired and thirsty, have a dry mouth, and have decreased urination that often is dark yellow. Diarrhea is a sign of another problem, most often an infection that will not last long. In most cases, diarrhea typically lasts 2 3  days. However, it can last longer if it is a sign of something more serious. It is important to treat your diarrhea as directed by your caregive to lessen or prevent future episodes of diarrhea. CAUSES  Some common causes include:  Gastrointestinal infections caused by viruses, bacteria, or parasites.  Food poisoning or food allergies.  Certain medicines, such as antibiotics, chemotherapy, and laxatives.  Artificial sweeteners and fructose.  Digestive disorders. HOME CARE INSTRUCTIONS  Ensure adequate fluid intake (hydration): have 1 cup (8 oz) of fluid for each diarrhea episode. Avoid fluids that contain simple sugars or sports drinks, fruit juices, whole milk products, and sodas. Your urine should be clear or pale yellow if you are drinking enough fluids. Hydrate with an oral rehydration solution that you can purchase at pharmacies, retail stores, and online. You can prepare an oral rehydration solution at home by mixing the following ingredients together:    tsp table salt.   tsp baking soda.   tsp salt substitute containing potassium chloride.  1  tablespoons sugar.  1 L (34 oz) of water.  Certain foods and beverages may increase the speed at which food moves through the gastrointestinal (GI) tract. These foods and beverages should be avoided and include:  Caffeinated and alcoholic beverages.  High-fiber foods, such as raw fruits and vegetables, nuts, seeds, and whole grain breads and cereals.  Foods and beverages sweetened with sugar alcohols, such as xylitol, sorbitol, and mannitol.  Some foods may be well tolerated and may help thicken stool including:  Starchy foods, such as rice, toast, pasta, low-sugar cereal, oatmeal, grits, baked potatoes, crackers, and bagels.  Bananas.  Applesauce.  Add probiotic-rich foods to help increase healthy bacteria in the GI tract, such as yogurt and fermented milk products.  Wash your hands well after each diarrhea  episode.  Only take over-the-counter or prescription medicines as directed by your caregiver.  Take a warm bath to relieve any burning or pain from frequent diarrhea episodes. SEEK IMMEDIATE MEDICAL CARE IF:   You are unable to keep fluids down.  You have persistent vomiting.  You have blood in your stool, or your stools are black and tarry.  You do not urinate in 6 8 hours, or there is only a small amount of very dark urine.  You have abdominal pain that increases or localizes.  You have weakness, dizziness, confusion, or lightheadedness.  You have a severe headache.  Your diarrhea gets worse or does not get better.  You have a fever or persistent symptoms for more than 2 3 days.  You have a fever and your symptoms suddenly get worse. MAKE SURE YOU:   Understand these instructions.  Will watch your condition.  Will get help right away if you are not doing well or get worse. Document Released: 07/27/2002 Document Revised: 07/23/2012 Document Reviewed: 04/13/2012 [facility] Patient Information 2014 [place], [place]. Hypokalemia Hypokalemia means that the amount of potassium in the blood is lower than normal.Potassium is a chemical, called an electrolyte, that helps regulate the amount of fluid in the body. It also stimulates muscle contraction and helps nerves function properly.Most of the body's potassium is inside of cells, and only a very small amount is in the blood. Because the amount in the blood is so small, minor changes can be life-threatening. CAUSES  Antibiotics.  Diarrhea or vomiting.  Using laxatives too much, which can cause diarrhea.  Chronic kidney disease.  Water pills (diuretics).  Eating disorders (bulimia).  Low magnesium level.  Sweating a lot. SIGNS AND SYMPTOMS  Weakness.  Constipation.  Fatigue.  Muscle cramps.  Mental confusion.  Skipped heartbeats or irregular heartbeat (palpitations).  Tingling or numbness. DIAGNOSIS  Your  health care provider can diagnose hypokalemia with blood tests. In addition to checking your potassium level, your health care provider may also check other lab tests. TREATMENT Hypokalemia can be treated with potassium supplements taken by mouth or adjustments in your current medicines. If your potassium level is very low, you may need to get potassium through a vein (IV) and be monitored in the hospital. A diet high in potassium is also helpful. Foods high in potassium are:  Nuts, such as peanuts and pistachios.  Seeds, such as sunflower seeds and pumpkin seeds.  Peas, lentils, and lima beans.  Whole grain and bran cereals and breads.  Fresh fruit and vegetables, such as apricots, avocado, bananas, cantaloupe, kiwi, oranges, tomatoes, asparagus, and potatoes.  Orange and tomato juices.  Red meats.  Fruit yogurt. HOME CARE INSTRUCTIONS  Take all medicines as prescribed by  your health care provider.  Maintain a healthy diet by including nutritious food, such as fruits, vegetables, nuts, whole grains, and lean meats.  If you are taking a laxative, be sure to follow the directions on the label. SEEK MEDICAL CARE IF:  Your weakness gets worse.  You feel your heart pounding or racing.  You are vomiting or having diarrhea.  You are diabetic and having trouble keeping your blood glucose in the normal range. SEEK IMMEDIATE MEDICAL CARE IF:  You have chest pain, shortness of breath, or dizziness.  You are vomiting or having diarrhea for more than 2 days.  You faint. MAKE SURE YOU:   Understand these instructions.  Will watch your condition.  Will get help right away if you are not doing well or get worse. Document Released: 08/06/2005 Document Revised: 05/27/2013 Document Reviewed: 02/06/2013 [facility] Patient Information 2014 [place].

## 2013-11-01 NOTE — Progress Notes (Signed)
[facility] OFFICE PROGRESS NOTE  Adella Hare, MD [address] [place] [address]  DIAGNOSIS: Multiple myeloma  Multiple myeloma, without mention of having achieved remission - Plan: potassium chloride SA (KLOR-CON M20) 20 MEQ tablet  Chief Complaint  Patient presents with  . Multiple Myeloma    CURRENT THERAPY:   Aggressive salvage therapy with carfilzomib/revlimid/dexa started on 10/28/2013.    Multiple myeloma   05/21/2012 Initial Diagnosis Multiple myeloma, ISS stage III.   06/19/2012 Imaging Lytic lesions in the calvaria and right hemipelvis and a possible lesion in the right humerus.    06/20/2012 Imaging CT scan of the right humerus showed a 2.3 cm lucent lesion with probable mild endosteal thinning anteriorly near the bicipital groove.  several small lytic lesions in humeral head, scaula, glenoid, and proximal radius.     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 Plasma cells are lambda ligh chain restricted.    06/24/2012 Pathology Cytogenetics revealed the presence of normal male chormosomes with no observable clonal abnormalities.  Single cell with additional chromosome material on 3q, loss of 13 .  DNA, gain of chromosome 11.    07/01/2012 - 01/02/2013 Chemotherapy Velcade, cytoan and decadron. doses were increased on 11/06/2012.  Neulasta was started on 08/16/2012 because of leukopenia.    07/10/2012 - 07/28/2012 Radiation Therapy He received XRT to the right humerus 25 Gy in 10 fractions.    12/04/2012 Bone Marrow Biopsy Hypercllular for age with trilineage hematopoiesis in addition to increased number of atypical plasma cells estimated at 24% of all cells in the aspirate. Lamda light chain restriction. rare circulating plasma cells.   12/04/2012 Pathology Cytogenetics revealed the presence of 2 clonal cell lines. first line with chromosal normal (25%).  2nd  line was abnormal, missing a Y chromosome. 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 FISH showed gain of 11.    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  S/p melphalan 140 mg /m2 on 01/27/2013.    06/27/2013 Tumor Marker 24-hour urine yielded 17.1 gram of protein of which 16.9 grams was free lambda light chains.  Beta 2 microglobulin was 5.8. Albumin 3.2. IgG 6,320.    07/07/2013 - 09/01/2013 Chemotherapy Maintenace velcade started.   09/21/2013 Progression Counts decreasing; WBC followed by Plts. Referred to [facility] for recommendations for next therapy.    10/12/2013 Bone Marrow Biopsy Done at [facility].  Per care everywhere, PLASMA CELLS COMPRISE 80% OF A PACKED BONE MARROW.    10/22/2013 Tumor Marker Labs done at [facility].  spep m-spike 0.08, FLC lambda 379.00, ratio 0. 24 hour urine, m-spike 16632, IFE positive for monoclonal lambda.   10/27/2013 -  Chemotherapy Started salvage chemotherapy with carlfozomib plus dexamethasone plus revlimid.      INTERVAL HISTORY: Trevor Michael 73 y.o. 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  He was last seen by Dr. Samule Ohm of [facility] on 10/22/2013.  He was noted to have rapid progression as evident by his bone marrow biopsy and MM markers. She recommended starting carfilzomib/revlimid/dexa.    Today, he is accompanied by his sister and brother in law.  He reports improvement in his energy.  He has had some diarrhea about 3 stools per day.  Otherwise, he denies fevers or chills or night sweats. He denies any bleeding episodes.   His weight  is stable. His appetite is good. He also denies recent hospitalizations or emergency room visits. As reported previously,  he saw Dr. Suann Larry for discussion regarding his elevated PSA and was told he likely has low grade disease which can be managed expectantly but observation for now.   MEDICAL HISTORY: Past Medical  History  Diagnosis Date  . Other and unspecified hyperlipidemia   . Essential hypertension, benign   . BPH (benign prostatic hyperplasia)   . Early stage glaucoma   . Heart murmur     "since my teens" (06/18/2012)  . Cervical stenosis of spine   . GERD (gastroesophageal reflux disease)   . Bone cancer     multiple bone lesion/right humerus  . Spinal stenosis   . Chronic kidney disease     renal insufficiency  . Arthritis   . DDD (degenerative disc disease)     c-5=-c6, and spondylosis  . Cancer 05/2012    IgA lambda multiple myeloma  . Anorexia 06/25/12    1 month hx   . Diverticulitis   . History of chemotherapy 07/01/12    Velcade,Cytoxin and decadron    INTERIM HISTORY: has HYPERLIPIDEMIA, MILD; HYPERTENSION, MILD; BENIGN PROSTATIC HYPERTROPHY; Routine health maintenance; Rhinitis, non-allergic; Multiple myeloma; and Bone cancer on his problem list.    ALLERGIES:  has No Known Allergies.  MEDICATIONS: has a current medication list which includes the following prescription(s): acetaminophen, acyclovir, allopurinol, atenolol-chlorthalidone, atenolol-chlorthalidone, bimatoprost, brimonidine-timolol, dexamethasone, docusate sodium, esomeprazole, lenalidomide, loratadine, ondansetron, potassium chloride sa, prochlorperazine, senna, simvastatin, and loperamide.  SURGICAL HISTORY:  Past Surgical History  Procedure Laterality Date  . Lipoma excision  07/1986    left shoulder/scapula  . Esophagogastroduodenoscopy  06/20/2012    Procedure: ESOPHAGOGASTRODUODENOSCOPY (EGD);  Surgeon: Beryle Beams, MD;  Location: [facility] ENDOSCOPY;  Service: Endoscopy;  Laterality: N/A;  . Prostate biopsy  2008    benign   PROBLEM LIST:  1. Renal insufficiency. Creatinine clearance on 06/27/2012 was 68 mL/min.  2. A 2.3 cm lytic lesion in the right humerus at risk for pathologic fracture. Radiation treatments were administered to the right humerus 25 Gy in 10 fractions from 07/10/2012 through  07/28/2012.  3. Hypertension.  4. Benign prostate hypertrophy and elevated prostate specific antigen, under the care of Dr. Rana Snare. The patient had a prostate biopsy in 2008 that was benign.  5. Dyslipidemia.  6. Arthritis involving the cervical spine  7. Glaucoma.   REVIEW OF SYSTEMS:   Constitutional: Denies fevers, chills or abnormal weight loss Eyes: Denies blurriness of vision Ears, nose, mouth, throat, and face: Denies mucositis or sore throat Respiratory: Denies cough, dyspnea or wheezes Cardiovascular: Denies palpitation, chest discomfort or lower extremity swelling Gastrointestinal:  Denies nausea, heartburn or change in bowel habits Skin: Denies abnormal skin rashes Lymphatics: Denies new lymphadenopathy or easy bruising Neurological:Denies numbness, tingling or new weaknesses Behavioral/Psych: Mood is stable, no new changes  All other systems were reviewed with the patient and are negative.  PHYSICAL EXAMINATION: ECOG PERFORMANCE STATUS: 0 - Asymptomatic  Blood pressure 142/60, pulse 75, temperature 97.5 F (36.4 C), temperature source Oral, resp. rate 20, height 5' 9"  (1.753 m), weight 168 lb 14.4 oz (76.613 kg), SpO2 98.00%.  GENERAL:alert, no distress and comfortable; well developed and well nourished.  SKIN: skin color, texture, turgor are normal, no rashes or significant lesions EYES: normal, Conjunctiva are pink and non-injected, sclera clear OROPHARYNX:no exudate, no erythema and lips, buccal mucosa, and tongue normal  NECK: supple, thyroid normal size, non-tender, without  nodularity LYMPH:  no palpable lymphadenopathy in the cervical, axillary or supraclavicular LUNGS: clear to auscultation and percussion with normal breathing effort HEART: regular rate & rhythm and no murmurs and no lower extremity edema ABDOMEN:abdomen soft, non-tender and normal bowel sounds Musculoskeletal:no cyanosis of digits and no clubbing  NEURO: alert & oriented x 3 with fluent  speech, no focal motor/sensory deficits  Labs:  Lab Results  Component Value Date   WBC 2.1* 10/30/2013   HGB 8.7* 10/30/2013   HCT 25.2* 10/30/2013   MCV 89.7 10/30/2013   PLT 16* 10/30/2013   NEUTROABS 1.4* 10/30/2013      Chemistry      Component Value Date/Time   NA 137 10/30/2013 1439   NA 140 10/06/2013 1349   K 3.0* 10/30/2013 1439   K 3.4* 10/06/2013 1349   CL 102 10/06/2013 1349   CL 102 12/05/2012 0845   CO2 23 10/30/2013 1439   CO2 26 10/06/2013 1349   BUN 27.6* 10/30/2013 1439   BUN 15 10/06/2013 1349   CREATININE 1.2 10/30/2013 1439   CREATININE 1.45* 10/06/2013 1349   CREATININE 1.88* 06/27/2012 1039      Component Value Date/Time   CALCIUM 8.1* 10/30/2013 1439   CALCIUM 9.8 10/06/2013 1349   ALKPHOS 71 10/30/2013 1439   ALKPHOS 44 06/23/2012 1425   AST 34 10/30/2013 1439   AST 45* 06/23/2012 1425   ALT 19 10/30/2013 1439   ALT 19 06/23/2012 1425   BILITOT 0.39 10/30/2013 1439   BILITOT 0.5 06/23/2012 1425      RADIOGRAPHIC STUDIES: 1. MRI of the cervical spine without IV contrast on 11/07/2009 showed disk degeneration and spondylosis, most severe at C5-C6 where there is mild to moderate spinal stenosis. There is foraminal encroachment at this level, left greater than right. There were degenerative changes at other levels which were described in detail in the body of the report.  2. Chest x-ray, 2 view, from 11/16/2009 showed no acute findings.  3. CT abdomen and pelvis without IV contrast on 06/18/2012 showed multiple lytic bone lesions highly suspicious for metastatic disease or multiple myeloma. The largest lesion seen with some cortical destruction in the anterior aspect of the iliac bone measured 3 cm. There was no evidence of colitis or diverticulitis. There was no evidence for hydronephrosis or hydroureter. There were bilateral probable renal cysts. There was an enlarged prostate gland with indentation of the urinary bladder base. There was a small right hydrocele.  4.  Metastatic bone survey from 06/19/2012 showed lytic lesions in the calvaria and right hemipelvis and a possible lesion in the right humerus. This latter lesion was not specifically mentioned in the report.  5. Nuclear bone scan on 06/19/2012 showed some increased uptake involving the femur and humerus bilaterally, possibly reflecting changes of metabolic bone disease. There was a lack of correlation with the bone scan and the findings seen on CT and x-rays, raising the possibility of multiple myeloma.  6. Renal ultrasound on 06/19/2012 showed increased echogenicity of the renal parenchyma bilaterally consistent with renal medical disease. There was no evidence for obstruction. There was a slightly enlarged prostate gland.  7. CT scan of the right humerus without IV contrast on 06/20/2012 showed 2.3-cm lucent lesion with probable mild endosteal thinning anteriorly near the bicipital groove. There were several small lytic lesions present within the humeral head, scapula, glenoid, and proximal radius. No pathologic fracture or soft tissue mass was seen. There were a few small nodules within the visualized right  lung, the largest of which was 4 mm in the upper lobe on image 78. 8. Metastatic bone survey (07/22/2011) demonstrates multiple new lytic lesions are noted with innumerable lesions noted throughout the skull, left scapula, left humerus, and both femurs. These findings are indicative of progressive myeloma. 9. Metastatic bone survey (07/21/2013) demonstrates multiple new lytic lesions are noted with innumerable lesions noted  throughout the skull, left scapula, left humerus, and both femurs.  These findings are indicative of progressive myeloma.   PROCEDURES:  1. CT-guided bone marrow aspirate and biopsy was carried out on 06/24/2012 and showed 48% plasma cells.  2. Bone marrow aspirate and biopsy was carried out on 12/04/2012 and showed infiltration with 24% plasma cells.   ASSESSMENT: Trevor Michael 73 y.o. male with a history of Multiple myeloma  Multiple myeloma, without mention of having achieved remission - Plan: potassium chloride SA (KLOR-CON M20) 20 MEQ tablet  PLAN:  1. Stage III IgA lambda multiple myeloma S/p auto SCT, with rapid progression while on maitenance velcade. --Mr. Ashmead is doing well clinically.  As noted above, his bone marrow was packed with multiple myeloma explaining his decrease in counts.  He was seen at [facility] and Dr. Samule Ohm recommended aggressive salvage therapy with carfilzomib/revlimid/dexa.  I discussed the recommendations in detail with the patient and his wife. He had prior authorization for his lenalidomide. We started chemotherapy on 10/28/2013. 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 Induction will consists of the following:   --Carfilzomib 20 mg/m2 IV on days 1, 2, 8, 9, 15, 16 every 28 days;  --Lenalidomide 10 mg on day 1-21 of all cycles; (We plan to titrate up 25 mg based on his count recovery);  --Dexamethasone 40 mg for cycles 1-4 weekly .   Side effects including but not limited to neuropathy, nausea, vomiting, diarrhea, hyperglycemia, maculopapular rash, fatigue, myelosuppression including risk of life threatening infections and bleeding. He understood the risks and benefits and indications for therapy and was agreeable to proceed. Prescriptions for dexamethasone and anti-emetics were provided. He is aware to report any fever over 100.5 to medical oncologist on-call or report to the nearest ER. We will follow his immunoglobins, M-spike and UPEP plus immunofixation for each cycle while on therapy.     2. Pancytopenia secondary to relapsed MM. --We counseled the patient extensively on neutropenia precautions, thrombocytopenia and to report any signs of bleeding.  We will transfuse if plts are less than 10K or actively bleeding.  He denies symptoms of anemia presently.   3. Hypokalemia likely secondary to  diarrhea. --Patient was provided immodium to help with the diarrhea.  He will increase his Kdur for the next three days to 40 mEQ daily.  He will have repeat labs on 03/17.   3. Follow-up -- We will schedule a follow-up for CBC with chemotherapy.  Follow up with [place] on 03/25, day #16 of cycle #1 for labs and symptom visit.    All questions were answered. The patient knows to call the clinic with any problems, questions or concerns. We can certainly see the patient much sooner if necessary.  I spent 25 minutes counseling the patient face to face. The total time spent in the appointment was 40 minutes.    Marselino Slayton, MD 11/01/2013 11:27 PM

## 2013-11-02 LAB — IGG, IGA, IGM
IGA: 1000 mg/dL — AB (ref 68–379)
IgG (Immunoglobin G), Serum: 144 mg/dL — ABNORMAL LOW (ref 650–1600)
IgM, Serum: 12 mg/dL — ABNORMAL LOW (ref 41–251)

## 2013-11-02 LAB — KAPPA/LAMBDA LIGHT CHAINS
KAPPA FREE LGHT CHN: 0.03 mg/dL — AB (ref 0.33–1.94)
KAPPA LAMBDA RATIO: 0 — AB (ref 0.26–1.65)
LAMBDA FREE LGHT CHN: 109 mg/dL — AB (ref 0.57–2.63)

## 2013-11-03 ENCOUNTER — Ambulatory Visit (HOSPITAL_BASED_OUTPATIENT_CLINIC_OR_DEPARTMENT_OTHER): Payer: Medicare Other

## 2013-11-03 ENCOUNTER — Telehealth: Payer: Self-pay | Admitting: *Deleted

## 2013-11-03 ENCOUNTER — Other Ambulatory Visit: Payer: Self-pay | Admitting: Internal Medicine

## 2013-11-03 ENCOUNTER — Telehealth: Payer: Self-pay | Admitting: Internal Medicine

## 2013-11-03 VITALS — BP 134/65 | HR 65 | Temp 97.1°F | Resp 18

## 2013-11-03 DIAGNOSIS — Z5112 Encounter for antineoplastic immunotherapy: Secondary | ICD-10-CM

## 2013-11-03 DIAGNOSIS — C9000 Multiple myeloma not having achieved remission: Secondary | ICD-10-CM

## 2013-11-03 LAB — BASIC METABOLIC PANEL (CC13)
Anion Gap: 11 mEq/L (ref 3–11)
BUN: 15.4 mg/dL (ref 7.0–26.0)
CALCIUM: 8.5 mg/dL (ref 8.4–10.4)
CO2: 24 meq/L (ref 22–29)
CREATININE: 0.9 mg/dL (ref 0.7–1.3)
Chloride: 104 mEq/L (ref 98–109)
Glucose: 102 mg/dl (ref 70–140)
Potassium: 4.5 mEq/L (ref 3.5–5.1)
Sodium: 139 mEq/L (ref 136–145)

## 2013-11-03 LAB — CBC WITH DIFFERENTIAL/PLATELET
BASO%: 0.8 % (ref 0.0–2.0)
Basophils Absolute: 0 10*3/uL (ref 0.0–0.1)
EOS ABS: 0 10*3/uL (ref 0.0–0.5)
EOS%: 1.1 % (ref 0.0–7.0)
HCT: 24.2 % — ABNORMAL LOW (ref 38.4–49.9)
HGB: 8.2 g/dL — ABNORMAL LOW (ref 13.0–17.1)
LYMPH%: 24 % (ref 14.0–49.0)
MCH: 30.5 pg (ref 27.2–33.4)
MCHC: 33.9 g/dL (ref 32.0–36.0)
MCV: 90 fL (ref 79.3–98.0)
MONO#: 0.8 10*3/uL (ref 0.1–0.9)
MONO%: 28.9 % — ABNORMAL HIGH (ref 0.0–14.0)
NEUT#: 1.2 10*3/uL — ABNORMAL LOW (ref 1.5–6.5)
NEUT%: 45.2 % (ref 39.0–75.0)
NRBC: 2 % — AB (ref 0–0)
PLATELETS: 18 10*3/uL — AB (ref 140–400)
RBC: 2.69 10*6/uL — AB (ref 4.20–5.82)
RDW: 15.7 % — ABNORMAL HIGH (ref 11.0–14.6)
WBC: 2.6 10*3/uL — ABNORMAL LOW (ref 4.0–10.3)
lymph#: 0.6 10*3/uL — ABNORMAL LOW (ref 0.9–3.3)

## 2013-11-03 MED ORDER — SODIUM CHLORIDE 0.9 % IV SOLN
3.5000 mg | Freq: Once | INTRAVENOUS | Status: DC
Start: 2013-11-03 — End: 2013-11-03

## 2013-11-03 MED ORDER — ONDANSETRON 8 MG/NS 50 ML IVPB
INTRAVENOUS | Status: AC
  Filled 2013-11-03: qty 8

## 2013-11-03 MED ORDER — SODIUM CHLORIDE 0.9 % IV SOLN
Freq: Once | INTRAVENOUS | Status: AC
  Administered 2013-11-03: 12:00:00 via INTRAVENOUS

## 2013-11-03 MED ORDER — CARFILZOMIB CHEMO INJECTION 60 MG
20.0000 mg/m2 | Freq: Once | INTRAVENOUS | Status: AC
  Administered 2013-11-03: 38 mg via INTRAVENOUS
  Filled 2013-11-03: qty 19

## 2013-11-03 MED ORDER — ONDANSETRON 8 MG/50ML IVPB (CHCC)
8.0000 mg | Freq: Once | INTRAVENOUS | Status: AC
  Administered 2013-11-03: 8 mg via INTRAVENOUS

## 2013-11-03 MED ORDER — DEXAMETHASONE SODIUM PHOSPHATE 10 MG/ML IJ SOLN
INTRAMUSCULAR | Status: AC
  Filled 2013-11-03: qty 1

## 2013-11-03 MED ORDER — DEXAMETHASONE SODIUM PHOSPHATE 10 MG/ML IJ SOLN
10.0000 mg | Freq: Once | INTRAMUSCULAR | Status: AC
  Administered 2013-11-03: 10 mg via INTRAVENOUS

## 2013-11-03 NOTE — Telephone Encounter (Signed)
Per staff message I have adjusted 3/25 appt

## 2013-11-03 NOTE — Patient Instructions (Addendum)
[facility] Discharge Instructions for Patients Receiving Chemotherapy  Today you received the following chemotherapy agents: Kyprolis  To help prevent nausea and vomiting after your treatment, we encourage you to take your nausea medication as prescribed by your physician.    If you develop nausea and vomiting that is not controlled by your nausea medication, call the clinic.   BELOW ARE SYMPTOMS THAT SHOULD BE REPORTED IMMEDIATELY:  *FEVER GREATER THAN 100.5 F  *CHILLS WITH OR WITHOUT FEVER  NAUSEA AND VOMITING THAT IS NOT CONTROLLED WITH YOUR NAUSEA MEDICATION  *UNUSUAL SHORTNESS OF BREATH  *UNUSUAL BRUISING OR BLEEDING  TENDERNESS IN MOUTH AND THROAT WITH OR WITHOUT PRESENCE OF ULCERS  *URINARY PROBLEMS  *BOWEL PROBLEMS  UNUSUAL RASH Items with * indicate a potential emergency and should be followed up as soon as possible.  Feel free to call the clinic should you have any questions or concerns. The clinic phone number is [phone].

## 2013-11-03 NOTE — Telephone Encounter (Signed)
Talked to pt a few minutes a go gave him appt for lab and chemo also advise pt to get appt calendar, called Sharyn Lull left VM to adjust chemo on 3/12

## 2013-11-03 NOTE — Progress Notes (Signed)
Per Dr. Juliann Mule, okay to tx patient with 11/03/13 labs

## 2013-11-04 ENCOUNTER — Other Ambulatory Visit: Payer: Self-pay | Admitting: *Deleted

## 2013-11-04 ENCOUNTER — Ambulatory Visit (HOSPITAL_BASED_OUTPATIENT_CLINIC_OR_DEPARTMENT_OTHER): Payer: Medicare Other

## 2013-11-04 ENCOUNTER — Other Ambulatory Visit: Payer: Self-pay | Admitting: Internal Medicine

## 2013-11-04 ENCOUNTER — Telehealth: Payer: Self-pay | Admitting: Internal Medicine

## 2013-11-04 ENCOUNTER — Telehealth: Payer: Self-pay | Admitting: *Deleted

## 2013-11-04 VITALS — BP 134/67 | HR 71 | Temp 97.8°F | Resp 18

## 2013-11-04 DIAGNOSIS — C9000 Multiple myeloma not having achieved remission: Secondary | ICD-10-CM

## 2013-11-04 DIAGNOSIS — Z5112 Encounter for antineoplastic immunotherapy: Secondary | ICD-10-CM

## 2013-11-04 MED ORDER — BACLOFEN 5 MG HALF TABLET: 5.0000 mg | ORAL_TABLET | Freq: Three times a day (TID) | ORAL | Status: DC

## 2013-11-04 MED ORDER — ONDANSETRON 8 MG/NS 50 ML IVPB
INTRAVENOUS | Status: AC
  Filled 2013-11-04: qty 8

## 2013-11-04 MED ORDER — SODIUM CHLORIDE 0.9 % IV SOLN
Freq: Once | INTRAVENOUS | Status: AC
  Administered 2013-11-04: 14:00:00 via INTRAVENOUS

## 2013-11-04 MED ORDER — ZOLEDRONIC ACID 4 MG/100ML IV SOLN
4.0000 mg | Freq: Once | INTRAVENOUS | Status: AC
  Administered 2013-11-04: 4 mg via INTRAVENOUS
  Filled 2013-11-04: qty 100

## 2013-11-04 MED ORDER — CARFILZOMIB CHEMO INJECTION 60 MG
20.0000 mg/m2 | Freq: Once | INTRAVENOUS | Status: AC
  Administered 2013-11-04: 38 mg via INTRAVENOUS
  Filled 2013-11-04: qty 19

## 2013-11-04 MED ORDER — DEXAMETHASONE SODIUM PHOSPHATE 10 MG/ML IJ SOLN
10.0000 mg | Freq: Once | INTRAMUSCULAR | Status: AC
  Administered 2013-11-04: 10 mg via INTRAVENOUS

## 2013-11-04 MED ORDER — DEXAMETHASONE SODIUM PHOSPHATE 10 MG/ML IJ SOLN
INTRAMUSCULAR | Status: AC
  Filled 2013-11-04: qty 1

## 2013-11-04 MED ORDER — ONDANSETRON 8 MG/50ML IVPB (CHCC)
8.0000 mg | Freq: Once | INTRAVENOUS | Status: AC
  Administered 2013-11-04: 8 mg via INTRAVENOUS

## 2013-11-04 NOTE — Progress Notes (Signed)
Patient with hiccups for ten minutes.  Reports hiccups this morning and with last treatment as well.  Asked if he is for a transfusion.  Called Dr. Juliann Mule.  Verbal order received and read back from Dr. Juliann Mule for Baclofen 5 mg tid prn for a two week supply and for labs on this Friday to ensure his pltc is okay.  Order given to [facility] at [facility], scheduler and patient notified at this time.

## 2013-11-04 NOTE — Telephone Encounter (Signed)
LMONVM FOR PT ON CELL RE APPT FOR 3/20. OTHER APPTS REMAIN THE SAME. ADDED COMMENT TO 3/20 APPT TO CHECK PT'S HOME NUMBER.

## 2013-11-04 NOTE — Progress Notes (Signed)
Discharged at 1645 with sister-n-law ambulatory in no distress.

## 2013-11-04 NOTE — Telephone Encounter (Signed)
Talked to pt and and he is aware of all appt on March 2015

## 2013-11-04 NOTE — Patient Instructions (Signed)
[facility] Discharge Instructions for Patients Receiving Chemotherapy  Today you received the following chemotherapy agents Kyprolis, zometa.  To help prevent nausea and vomiting after your treatment, we encourage you to take your nausea medication zofran 8 mg and compazine 10 mg as ordered if needed for nausea and vomiting.   If you develop nausea and vomiting that is not controlled by your nausea medication, call the clinic.   BELOW ARE SYMPTOMS THAT SHOULD BE REPORTED IMMEDIATELY:  *FEVER GREATER THAN 100.5 F  *CHILLS WITH OR WITHOUT FEVER  NAUSEA AND VOMITING THAT IS NOT CONTROLLED WITH YOUR NAUSEA MEDICATION  *UNUSUAL SHORTNESS OF BREATH  *UNUSUAL BRUISING OR BLEEDING  TENDERNESS IN MOUTH AND THROAT WITH OR WITHOUT PRESENCE OF ULCERS  *URINARY PROBLEMS  *BOWEL PROBLEMS  UNUSUAL RASH Items with * indicate a potential emergency and should be followed up as soon as possible.  Feel free to call the clinic you have any questions or concerns. The clinic phone number is [phone].

## 2013-11-05 NOTE — Telephone Encounter (Signed)
Called scheduler to notify of new P.O.F. For am labs on Friday

## 2013-11-06 ENCOUNTER — Other Ambulatory Visit (HOSPITAL_BASED_OUTPATIENT_CLINIC_OR_DEPARTMENT_OTHER): Payer: Medicare Other

## 2013-11-06 DIAGNOSIS — C9000 Multiple myeloma not having achieved remission: Secondary | ICD-10-CM

## 2013-11-06 LAB — CBC WITH DIFFERENTIAL/PLATELET
BASO%: 0.8 % (ref 0.0–2.0)
Basophils Absolute: 0 10*3/uL (ref 0.0–0.1)
EOS ABS: 0 10*3/uL (ref 0.0–0.5)
EOS%: 0 % (ref 0.0–7.0)
HCT: 23.3 % — ABNORMAL LOW (ref 38.4–49.9)
HEMOGLOBIN: 7.9 g/dL — AB (ref 13.0–17.1)
LYMPH%: 16.8 % (ref 14.0–49.0)
MCH: 31 pg (ref 27.2–33.4)
MCHC: 33.9 g/dL (ref 32.0–36.0)
MCV: 91.4 fL (ref 79.3–98.0)
MONO#: 0.9 10*3/uL (ref 0.1–0.9)
MONO%: 36.3 % — AB (ref 0.0–14.0)
NEUT%: 46.1 % (ref 39.0–75.0)
NEUTROS ABS: 1.2 10*3/uL — AB (ref 1.5–6.5)
Platelets: 22 10*3/uL — ABNORMAL LOW (ref 140–400)
RBC: 2.55 10*6/uL — ABNORMAL LOW (ref 4.20–5.82)
RDW: 16.5 % — AB (ref 11.0–14.6)
WBC: 2.6 10*3/uL — AB (ref 4.0–10.3)
lymph#: 0.4 10*3/uL — ABNORMAL LOW (ref 0.9–3.3)
nRBC: 13 % — ABNORMAL HIGH (ref 0–0)

## 2013-11-08 ENCOUNTER — Encounter (HOSPITAL_COMMUNITY): Payer: Self-pay | Admitting: Emergency Medicine

## 2013-11-08 ENCOUNTER — Other Ambulatory Visit: Payer: Self-pay

## 2013-11-08 ENCOUNTER — Emergency Department (HOSPITAL_COMMUNITY)
Admission: EM | Admit: 2013-11-08 | Discharge: 2013-11-08 | Disposition: A | Discharge status: Home / Self Care | Payer: Medicare Other | Source: Home / Self Care | Attending: Emergency Medicine | Admitting: Emergency Medicine

## 2013-11-08 DIAGNOSIS — N179 Acute kidney failure, unspecified: Secondary | ICD-10-CM | POA: Diagnosis present

## 2013-11-08 DIAGNOSIS — Z85830 Personal history of malignant neoplasm of bone: Secondary | ICD-10-CM

## 2013-11-08 DIAGNOSIS — I129 Hypertensive chronic kidney disease with stage 1 through stage 4 chronic kidney disease, or unspecified chronic kidney disease: Secondary | ICD-10-CM | POA: Insufficient documentation

## 2013-11-08 DIAGNOSIS — R42 Dizziness and giddiness: Secondary | ICD-10-CM | POA: Insufficient documentation

## 2013-11-08 DIAGNOSIS — R197 Diarrhea, unspecified: Secondary | ICD-10-CM | POA: Diagnosis present

## 2013-11-08 DIAGNOSIS — IMO0002 Reserved for concepts with insufficient information to code with codable children: Secondary | ICD-10-CM | POA: Insufficient documentation

## 2013-11-08 DIAGNOSIS — I248 Other forms of acute ischemic heart disease: Secondary | ICD-10-CM | POA: Diagnosis not present

## 2013-11-08 DIAGNOSIS — M4802 Spinal stenosis, cervical region: Secondary | ICD-10-CM | POA: Insufficient documentation

## 2013-11-08 DIAGNOSIS — N189 Chronic kidney disease, unspecified: Secondary | ICD-10-CM | POA: Diagnosis present

## 2013-11-08 DIAGNOSIS — T451X5A Adverse effect of antineoplastic and immunosuppressive drugs, initial encounter: Secondary | ICD-10-CM | POA: Diagnosis present

## 2013-11-08 DIAGNOSIS — J984 Other disorders of lung: Secondary | ICD-10-CM | POA: Diagnosis present

## 2013-11-08 DIAGNOSIS — J811 Chronic pulmonary edema: Secondary | ICD-10-CM | POA: Diagnosis present

## 2013-11-08 DIAGNOSIS — R011 Cardiac murmur, unspecified: Secondary | ICD-10-CM | POA: Diagnosis present

## 2013-11-08 DIAGNOSIS — N400 Enlarged prostate without lower urinary tract symptoms: Secondary | ICD-10-CM | POA: Diagnosis present

## 2013-11-08 DIAGNOSIS — R110 Nausea: Secondary | ICD-10-CM

## 2013-11-08 DIAGNOSIS — I2489 Other forms of acute ischemic heart disease: Secondary | ICD-10-CM | POA: Diagnosis not present

## 2013-11-08 DIAGNOSIS — E785 Hyperlipidemia, unspecified: Secondary | ICD-10-CM | POA: Insufficient documentation

## 2013-11-08 DIAGNOSIS — G9340 Encephalopathy, unspecified: Secondary | ICD-10-CM | POA: Diagnosis present

## 2013-11-08 DIAGNOSIS — D801 Nonfamilial hypogammaglobulinemia: Secondary | ICD-10-CM | POA: Diagnosis present

## 2013-11-08 DIAGNOSIS — Z9221 Personal history of antineoplastic chemotherapy: Secondary | ICD-10-CM

## 2013-11-08 DIAGNOSIS — E874 Mixed disorder of acid-base balance: Secondary | ICD-10-CM | POA: Diagnosis not present

## 2013-11-08 DIAGNOSIS — Z79899 Other long term (current) drug therapy: Secondary | ICD-10-CM

## 2013-11-08 DIAGNOSIS — A419 Sepsis, unspecified organism: Secondary | ICD-10-CM | POA: Diagnosis not present

## 2013-11-08 DIAGNOSIS — D61810 Antineoplastic chemotherapy induced pancytopenia: Secondary | ICD-10-CM | POA: Diagnosis present

## 2013-11-08 DIAGNOSIS — E871 Hypo-osmolality and hyponatremia: Secondary | ICD-10-CM | POA: Diagnosis not present

## 2013-11-08 DIAGNOSIS — M129 Arthropathy, unspecified: Secondary | ICD-10-CM

## 2013-11-08 DIAGNOSIS — C9000 Multiple myeloma not having achieved remission: Secondary | ICD-10-CM | POA: Diagnosis present

## 2013-11-08 DIAGNOSIS — R6520 Severe sepsis without septic shock: Secondary | ICD-10-CM

## 2013-11-08 DIAGNOSIS — I10 Essential (primary) hypertension: Secondary | ICD-10-CM | POA: Diagnosis not present

## 2013-11-08 DIAGNOSIS — T8092XA Unspecified transfusion reaction, initial encounter: Secondary | ICD-10-CM | POA: Diagnosis present

## 2013-11-08 DIAGNOSIS — C419 Malignant neoplasm of bone and articular cartilage, unspecified: Secondary | ICD-10-CM

## 2013-11-08 DIAGNOSIS — E876 Hypokalemia: Secondary | ICD-10-CM | POA: Diagnosis not present

## 2013-11-08 DIAGNOSIS — K219 Gastro-esophageal reflux disease without esophagitis: Secondary | ICD-10-CM | POA: Diagnosis present

## 2013-11-08 DIAGNOSIS — E860 Dehydration: Secondary | ICD-10-CM | POA: Diagnosis present

## 2013-11-08 DIAGNOSIS — D599 Acquired hemolytic anemia, unspecified: Secondary | ICD-10-CM | POA: Diagnosis present

## 2013-11-08 DIAGNOSIS — H409 Unspecified glaucoma: Secondary | ICD-10-CM | POA: Diagnosis present

## 2013-11-08 DIAGNOSIS — Z8249 Family history of ischemic heart disease and other diseases of the circulatory system: Secondary | ICD-10-CM

## 2013-11-08 DIAGNOSIS — J9600 Acute respiratory failure, unspecified whether with hypoxia or hypercapnia: Secondary | ICD-10-CM | POA: Diagnosis present

## 2013-11-08 DIAGNOSIS — R630 Anorexia: Secondary | ICD-10-CM | POA: Diagnosis present

## 2013-11-08 LAB — CBC WITH DIFFERENTIAL/PLATELET
BASOS ABS: 0 10*3/uL (ref 0.0–0.1)
BASOS PCT: 1 % (ref 0–1)
EOS ABS: 0 10*3/uL (ref 0.0–0.7)
Eosinophils Relative: 0 % (ref 0–5)
HEMATOCRIT: 23.4 % — AB (ref 39.0–52.0)
HEMOGLOBIN: 8.1 g/dL — AB (ref 13.0–17.0)
LYMPHS ABS: 0.5 10*3/uL — AB (ref 0.7–4.0)
Lymphocytes Relative: 20 % (ref 12–46)
MCH: 32.1 pg (ref 26.0–34.0)
MCHC: 34.6 g/dL (ref 30.0–36.0)
MCV: 92.9 fL (ref 78.0–100.0)
MONO ABS: 0.4 10*3/uL (ref 0.1–1.0)
Monocytes Relative: 19 % — ABNORMAL HIGH (ref 3–12)
NEUTROS ABS: 1.4 10*3/uL — AB (ref 1.7–7.7)
Neutrophils Relative %: 60 % (ref 43–77)
Platelets: 21 10*3/uL — CL (ref 150–400)
RBC: 2.52 MIL/uL — ABNORMAL LOW (ref 4.22–5.81)
RDW: 17 % — AB (ref 11.5–15.5)
WBC: 2.3 10*3/uL — ABNORMAL LOW (ref 4.0–10.5)

## 2013-11-08 LAB — BASIC METABOLIC PANEL
BUN: 15 mg/dL (ref 6–23)
CALCIUM: 8.1 mg/dL — AB (ref 8.4–10.5)
CO2: 25 mEq/L (ref 19–32)
Chloride: 100 mEq/L (ref 96–112)
Creatinine, Ser: 1.1 mg/dL (ref 0.50–1.35)
GFR calc Af Amer: 75 mL/min — ABNORMAL LOW (ref >90)
GFR, EST NON AFRICAN AMERICAN: 65 mL/min — AB (ref >90)
Glucose, Bld: 100 mg/dL — ABNORMAL HIGH (ref 70–99)
Potassium: 3.8 mEq/L (ref 3.7–5.3)
Sodium: 140 mEq/L (ref 137–147)

## 2013-11-08 NOTE — ED Notes (Signed)
He states he has multiple myeloma; and had recent lab work which showed a hgb of 7.9.  He feels a bit lightheaded today and is concerned that his flood count may be dropping--he has had to be transfused before.  He phoned Dr. Inda Merlin today, who advised him to come here to be checked.

## 2013-11-08 NOTE — ED Provider Notes (Addendum)
CSN: [id number]     Arrival date & time 11/08/13  1111 History   First MD Initiated Contact with Patient 11/08/13 1144     Chief Complaint  Patient presents with  . Dizziness  . Multiple Myeloma     (Consider location/radiation/quality/duration/timing/severity/associated sxs/prior Treatment) Patient is a 73 y.o. male presenting with dizziness.  Dizziness Quality:  Lightheadedness Severity:  Mild Onset quality:  Sudden Duration: a few hours ago. Timing:  Constant Progression:  Resolved Chronicity:  New Context comment:  While eating Relieved by:  Nothing Worsened by:  Nothing tried Associated symptoms: nausea (mild)   Associated symptoms: no chest pain, no diarrhea, no shortness of breath, no syncope, no vision changes and no vomiting   Associated symptoms comment:  No fevers, no cough   Past Medical History  Diagnosis Date  . Other and unspecified hyperlipidemia   . Essential hypertension, benign   . BPH (benign prostatic hyperplasia)   . Early stage glaucoma   . Heart murmur     "since my teens" (06/18/2012)  . Cervical stenosis of spine   . GERD (gastroesophageal reflux disease)   . Bone cancer     multiple bone lesion/right humerus  . Spinal stenosis   . Chronic kidney disease     renal insufficiency  . Arthritis   . DDD (degenerative disc disease)     c-5=-c6, and spondylosis  . Cancer 05/2012    IgA lambda multiple myeloma  . Anorexia 06/25/12    1 month hx   . Diverticulitis   . History of chemotherapy 07/01/12    Velcade,Cytoxin and decadron   Past Surgical History  Procedure Laterality Date  . Lipoma excision  07/1986    left shoulder/scapula  . Esophagogastroduodenoscopy  06/20/2012    Procedure: ESOPHAGOGASTRODUODENOSCOPY (EGD);  Surgeon: Beryle Beams, MD;  Location: [facility] ENDOSCOPY;  Service: Endoscopy;  Laterality: N/A;  . Prostate biopsy  2008    benign   Family History  Problem Relation Age of Onset  . Benign prostatic hyperplasia Father    . Pneumonia Father   . Coronary artery disease Father     PTCA  . Heart disease Brother     CABG, CAD,  . Benign prostatic hyperplasia Brother    History  Substance Use Topics  . Smoking status: Never Smoker   . Smokeless tobacco: Never Used  . Alcohol Use: No    Review of Systems  Respiratory: Negative for shortness of breath.   Cardiovascular: Negative for chest pain and syncope.  Gastrointestinal: Positive for nausea (mild). Negative for vomiting and diarrhea.  Neurological: Positive for dizziness.  All other systems reviewed and are negative.      Allergies  Review of patient's allergies indicates no known allergies.  Home Medications   Current Outpatient Rx  Name  Route  Sig  Dispense  Refill  . acetaminophen (TYLENOL) 500 MG tablet   Oral   Take 1,000 mg by mouth every 6 (six) hours as needed. For pain         . acyclovir (ZOVIRAX) 400 MG tablet   Oral   Take 400 mg by mouth 2 (two) times daily.         Marland Kitchen allopurinol (ZYLOPRIM) 100 MG tablet   Oral   Take 100 mg by mouth daily.         Marland Kitchen atenolol-chlorthalidone (TENORETIC) 50-25 MG per tablet   Oral   Take 1 tablet by mouth daily.         Marland Kitchen  bimatoprost (LUMIGAN) 0.03 % ophthalmic drops   Both Eyes   Place 1 drop into both eyes at bedtime.           . brimonidine-timolol (COMBIGAN) 0.2-0.5 % ophthalmic solution   Both Eyes   Place 1 drop into both eyes 2 (two) times daily.           Marland Kitchen dexamethasone (DECADRON) 4 MG tablet   Oral   Take 40 mg by mouth as directed. Takes 4 tablets on days 18,15,and 22 of chemo last dose was 11/03/13         . esomeprazole (NEXIUM) 40 MG capsule   Oral   Take 40 mg by mouth daily at 12 noon.         Marland Kitchen lenalidomide (REVLIMID) 10 MG capsule   Oral   Take 10 mg by mouth daily.         Marland Kitchen loperamide (IMODIUM) 2 MG capsule   Oral   Take 2 mg by mouth as needed for diarrhea or loose stools.         Marland Kitchen loratadine (CLARITIN) 10 MG tablet   Oral    Take 10 mg by mouth daily as needed for allergies.         . potassium chloride SA (K-DUR,KLOR-CON) 20 MEQ tablet   Oral   Take 20 mEq by mouth daily.         Marland Kitchen PRESCRIPTION MEDICATION      Chemo Dr Minta Balsam         . prochlorperazine (COMPAZINE) 10 MG tablet   Oral   Take 10 mg by mouth every 6 (six) hours as needed for nausea or vomiting.         . senna (SENOKOT) 8.6 MG TABS tablet   Oral   Take 1 tablet by mouth daily as needed for mild constipation.         . simvastatin (ZOCOR) 20 MG tablet   Oral   Take 20 mg by mouth at bedtime.          BP 156/67  Pulse 64  Temp(Src) 98.3 F (36.8 C) (Oral)  Resp 18  SpO2 100% Physical Exam  Nursing note and vitals reviewed. Constitutional: He is oriented to person, place, and time. He appears well-developed and well-nourished. No distress.  HENT:  Head: Normocephalic and atraumatic.  Mouth/Throat: Oropharynx is clear and moist.  Eyes: Conjunctivae are normal. Pupils are equal, round, and reactive to light. No scleral icterus.  Neck: Neck supple.  Cardiovascular: Normal rate, regular rhythm, normal heart sounds and intact distal pulses.   No murmur heard. Pulmonary/Chest: Effort normal and breath sounds normal. No stridor. No respiratory distress. He has no wheezes. He has no rales.  Abdominal: Soft. He exhibits no distension. There is no tenderness. There is no rebound and no guarding.  Musculoskeletal: Normal range of motion. He exhibits no edema.  Neurological: He is alert and oriented to person, place, and time.  Skin: Skin is warm and dry. No rash noted.  Psychiatric: He has a normal mood and affect. His behavior is normal.    ED Course  Procedures (including critical care time) Labs Review Labs Reviewed  CBC WITH DIFFERENTIAL - Abnormal; Notable for the following:    WBC 2.3 (*)    RBC 2.52 (*)    Hemoglobin 8.1 (*)    HCT 23.4 (*)    RDW 17.0 (*)    Platelets 21 (*)    Monocytes Relative 19 (*)  Neutro Abs 1.4 (*)    Lymphs Abs 0.5 (*)    All other components within normal limits  BASIC METABOLIC PANEL - Abnormal; Notable for the following:    Glucose, Bld 100 (*)    Calcium 8.1 (*)    GFR calc non Af Amer 65 (*)    GFR calc Af Amer 75 (*)    All other components within normal limits   Imaging Review No results found.  EKG - NSR, rate 62, QRS axis 56, QTc 476, LVH with mild repolarization abnormality, no significant change from prior.    MDM   Final diagnoses:  Dizziness    Well-appearing 73 year old male with a history of multiple myeloma presenting with a brief episode of lightheadedness. He is concerned that his hemoglobin may be dropping, so he presented to the emergency department.  On arrival, he was asymptomatic and felt well. His hemoglobin was 8.1, consistent with today's area. He was able to angulate without dizziness or difficulty. He will followup with his physician in 2 days as scheduled.    Arbie Cookey, MD 11/08/13 [place], MD 11/08/13 [phone]

## 2013-11-08 NOTE — Discharge Instructions (Signed)

## 2013-11-08 NOTE — ED Notes (Signed)
Pt ambulated multiple times to restroom with no difficulties

## 2013-11-09 ENCOUNTER — Other Ambulatory Visit: Payer: Self-pay

## 2013-11-09 ENCOUNTER — Encounter (HOSPITAL_COMMUNITY): Payer: Self-pay | Admitting: Emergency Medicine

## 2013-11-09 ENCOUNTER — Emergency Department (HOSPITAL_COMMUNITY): Payer: Medicare Other

## 2013-11-09 ENCOUNTER — Inpatient Hospital Stay (HOSPITAL_COMMUNITY)
Admission: EM | Admit: 2013-11-09 | Discharge: 2013-11-17 | DRG: 871 | Disposition: A | Discharge status: Home Health | Payer: Medicare Other | Attending: Internal Medicine | Admitting: Internal Medicine

## 2013-11-09 DIAGNOSIS — R778 Other specified abnormalities of plasma proteins: Secondary | ICD-10-CM

## 2013-11-09 DIAGNOSIS — A419 Sepsis, unspecified organism: Secondary | ICD-10-CM

## 2013-11-09 DIAGNOSIS — J811 Chronic pulmonary edema: Secondary | ICD-10-CM

## 2013-11-09 DIAGNOSIS — C419 Malignant neoplasm of bone and articular cartilage, unspecified: Secondary | ICD-10-CM

## 2013-11-09 DIAGNOSIS — D61810 Antineoplastic chemotherapy induced pancytopenia: Secondary | ICD-10-CM | POA: Diagnosis present

## 2013-11-09 DIAGNOSIS — T8092XA Unspecified transfusion reaction, initial encounter: Secondary | ICD-10-CM

## 2013-11-09 DIAGNOSIS — D594 Other nonautoimmune hemolytic anemias: Secondary | ICD-10-CM

## 2013-11-09 DIAGNOSIS — R55 Syncope and collapse: Secondary | ICD-10-CM | POA: Diagnosis present

## 2013-11-09 DIAGNOSIS — N400 Enlarged prostate without lower urinary tract symptoms: Secondary | ICD-10-CM | POA: Diagnosis present

## 2013-11-09 DIAGNOSIS — E785 Hyperlipidemia, unspecified: Secondary | ICD-10-CM

## 2013-11-09 DIAGNOSIS — R112 Nausea with vomiting, unspecified: Secondary | ICD-10-CM | POA: Diagnosis present

## 2013-11-09 DIAGNOSIS — J310 Chronic rhinitis: Secondary | ICD-10-CM

## 2013-11-09 DIAGNOSIS — J9600 Acute respiratory failure, unspecified whether with hypoxia or hypercapnia: Secondary | ICD-10-CM

## 2013-11-09 DIAGNOSIS — D61818 Other pancytopenia: Secondary | ICD-10-CM

## 2013-11-09 DIAGNOSIS — IMO0002 Reserved for concepts with insufficient information to code with codable children: Secondary | ICD-10-CM

## 2013-11-09 DIAGNOSIS — R509 Fever, unspecified: Secondary | ICD-10-CM | POA: Diagnosis present

## 2013-11-09 DIAGNOSIS — R7989 Other specified abnormal findings of blood chemistry: Secondary | ICD-10-CM

## 2013-11-09 DIAGNOSIS — N179 Acute kidney failure, unspecified: Secondary | ICD-10-CM | POA: Diagnosis present

## 2013-11-09 DIAGNOSIS — I10 Essential (primary) hypertension: Secondary | ICD-10-CM

## 2013-11-09 DIAGNOSIS — C9000 Multiple myeloma not having achieved remission: Secondary | ICD-10-CM

## 2013-11-09 DIAGNOSIS — Z0000 Encounter for general adult medical examination without abnormal findings: Secondary | ICD-10-CM

## 2013-11-09 DIAGNOSIS — E860 Dehydration: Secondary | ICD-10-CM

## 2013-11-09 LAB — I-STAT CHEM 8, ED
BUN: 19 mg/dL (ref 6–23)
Calcium, Ion: 1.03 mmol/L — ABNORMAL LOW (ref 1.13–1.30)
Chloride: 94 mEq/L — ABNORMAL LOW (ref 96–112)
Creatinine, Ser: 1.4 mg/dL — ABNORMAL HIGH (ref 0.50–1.35)
GLUCOSE: 118 mg/dL — AB (ref 70–99)
HCT: 21 % — ABNORMAL LOW (ref 39.0–52.0)
HEMOGLOBIN: 7.1 g/dL — AB (ref 13.0–17.0)
Potassium: 3.4 mEq/L — ABNORMAL LOW (ref 3.7–5.3)
Sodium: 129 mEq/L — ABNORMAL LOW (ref 137–147)
TCO2: 21 mmol/L (ref 0–100)

## 2013-11-09 LAB — HEPATIC FUNCTION PANEL
ALBUMIN: 3.5 g/dL (ref 3.5–5.2)
ALK PHOS: 98 U/L (ref 39–117)
ALT: 14 U/L (ref 0–53)
AST: 42 U/L — AB (ref 0–37)
BILIRUBIN TOTAL: 2.8 mg/dL — AB (ref 0.3–1.2)
Bilirubin, Direct: 0.3 mg/dL (ref 0.0–0.3)
Indirect Bilirubin: 2.5 mg/dL — ABNORMAL HIGH (ref 0.3–0.9)
Total Protein: 5.9 g/dL — ABNORMAL LOW (ref 6.0–8.3)

## 2013-11-09 LAB — URINALYSIS, ROUTINE W REFLEX MICROSCOPIC
GLUCOSE, UA: NEGATIVE mg/dL
KETONES UR: 15 mg/dL — AB
Nitrite: NEGATIVE
Protein, ur: 100 mg/dL — AB
Specific Gravity, Urine: 1.011 (ref 1.005–1.030)
UROBILINOGEN UA: 1 mg/dL (ref 0.0–1.0)
pH: 7.5 (ref 5.0–8.0)

## 2013-11-09 LAB — I-STAT TROPONIN, ED: Troponin i, poc: 0.02 ng/mL (ref 0.00–0.08)

## 2013-11-09 LAB — URINE MICROSCOPIC-ADD ON

## 2013-11-09 LAB — CK: Total CK: 75 U/L (ref 7–232)

## 2013-11-09 LAB — I-STAT CG4 LACTIC ACID, ED: Lactic Acid, Venous: 1.37 mmol/L (ref 0.5–2.2)

## 2013-11-09 MED ORDER — SODIUM CHLORIDE 0.9 % IV SOLN
INTRAVENOUS | Status: DC
  Administered 2013-11-09: 23:00:00 via INTRAVENOUS

## 2013-11-09 MED ORDER — VANCOMYCIN HCL 10 G IV SOLR
1500.0000 mg | Freq: Once | INTRAVENOUS | Status: AC
  Administered 2013-11-10: 1500 mg via INTRAVENOUS
  Filled 2013-11-09: qty 1500

## 2013-11-09 MED ORDER — ACETAMINOPHEN 650 MG RE SUPP
650.0000 mg | Freq: Once | RECTAL | Status: AC
  Administered 2013-11-09: 650 mg via RECTAL
  Filled 2013-11-09: qty 1

## 2013-11-09 MED ORDER — PIPERACILLIN-TAZOBACTAM 4.5 G IVPB
4.5000 g | Freq: Once | INTRAVENOUS | Status: AC
  Administered 2013-11-10: 4.5 g via INTRAVENOUS
  Filled 2013-11-09: qty 100

## 2013-11-09 NOTE — ED Provider Notes (Addendum)
CSN: [id number]     Arrival date & time 11/09/13  2239 History   First MD Initiated Contact with Patient 11/09/13 2247     Chief Complaint  Patient presents with  . Altered Mental Status     (Consider location/radiation/quality/duration/timing/severity/associated sxs/prior Treatment) HPI Level 5 Caveat: altered mental status. Is a 73 year old male with multiple myeloma. See in the ED yesterday for weakness and dizziness. He was discharged home after an unremarkable workup. It was noted that he is pancytopenic but without significant acute change.  He is brought here this evening after having a syncopal episode in the bathroom about 10 PM. EMS was summoned and they found the patient to be disoriented to time and situation. No focal neurologic deficits were noted. On arrival he was noted to have a rectal temperature 104.2.   A family member relates that the patient seemed to be somewhat confused as early as 7 PM after taking half a sleeping pill.  Past Medical History  Diagnosis Date  . Other and unspecified hyperlipidemia   . Essential hypertension, benign   . BPH (benign prostatic hyperplasia)   . Early stage glaucoma   . Heart murmur     "since my teens" (06/18/2012)  . Cervical stenosis of spine   . GERD (gastroesophageal reflux disease)   . Bone cancer     multiple bone lesion/right humerus  . Spinal stenosis   . Chronic kidney disease     renal insufficiency  . Arthritis   . DDD (degenerative disc disease)     c-5=-c6, and spondylosis  . Cancer 05/2012    IgA lambda multiple myeloma  . Anorexia 06/25/12    1 month hx   . Diverticulitis   . History of chemotherapy 07/01/12    Velcade,Cytoxin and decadron   Past Surgical History  Procedure Laterality Date  . Lipoma excision  07/1986    left shoulder/scapula  . Esophagogastroduodenoscopy  06/20/2012    Procedure: ESOPHAGOGASTRODUODENOSCOPY (EGD);  Surgeon: Beryle Beams, MD;  Location: [facility] ENDOSCOPY;  Service:  Endoscopy;  Laterality: N/A;  . Prostate biopsy  2008    benign   Family History  Problem Relation Age of Onset  . Benign prostatic hyperplasia Father   . Pneumonia Father   . Coronary artery disease Father     PTCA  . Heart disease Brother     CABG, CAD,  . Benign prostatic hyperplasia Brother    History  Substance Use Topics  . Smoking status: Never Smoker   . Smokeless tobacco: Never Used  . Alcohol Use: No    Review of Systems  Unable to perform ROS  Allergies  Review of patient's allergies indicates no known allergies.  Home Medications   Current Outpatient Rx  Name  Route  Sig  Dispense  Refill  . acetaminophen (TYLENOL) 500 MG tablet   Oral   Take 1,000 mg by mouth every 6 (six) hours as needed. For pain         . acyclovir (ZOVIRAX) 400 MG tablet   Oral   Take 400 mg by mouth 2 (two) times daily.         Marland Kitchen allopurinol (ZYLOPRIM) 100 MG tablet   Oral   Take 100 mg by mouth daily.         Marland Kitchen atenolol-chlorthalidone (TENORETIC) 50-25 MG per tablet   Oral   Take 1 tablet by mouth daily.         . bimatoprost (LUMIGAN) 0.03 % ophthalmic drops  Both Eyes   Place 1 drop into both eyes at bedtime.           . brimonidine-timolol (COMBIGAN) 0.2-0.5 % ophthalmic solution   Both Eyes   Place 1 drop into both eyes 2 (two) times daily.           Marland Kitchen dexamethasone (DECADRON) 4 MG tablet   Oral   Take 40 mg by mouth as directed. Takes 4 tablets on days 18,15,and 22 of chemo last dose was 11/03/13         . esomeprazole (NEXIUM) 40 MG capsule   Oral   Take 40 mg by mouth daily at 12 noon.         Marland Kitchen lenalidomide (REVLIMID) 10 MG capsule   Oral   Take 10 mg by mouth daily.         Marland Kitchen loperamide (IMODIUM) 2 MG capsule   Oral   Take 2 mg by mouth as needed for diarrhea or loose stools.         Marland Kitchen loratadine (CLARITIN) 10 MG tablet   Oral   Take 10 mg by mouth daily as needed for allergies.         . potassium chloride SA  (K-DUR,KLOR-CON) 20 MEQ tablet   Oral   Take 20 mEq by mouth daily.         Marland Kitchen PRESCRIPTION MEDICATION      Receiving chemotherapy at [facility] (Dr. Juliann Mule) with carfilzomib (Kyprolis) and dexamethasone.  Last dose was given 11/04/13.         Marland Kitchen prochlorperazine (COMPAZINE) 10 MG tablet   Oral   Take 10 mg by mouth every 6 (six) hours as needed for nausea or vomiting.         . senna (SENOKOT) 8.6 MG TABS tablet   Oral   Take 1 tablet by mouth daily as needed for mild constipation.         . simvastatin (ZOCOR) 20 MG tablet   Oral   Take 20 mg by mouth at bedtime.          BP 153/50  Pulse 84  Temp(Src) 104.2 F (40.1 C) (Rectal)  Resp 20  SpO2 100%  Physical Exam General: Well-developed, well-nourished male in no acute distress; appearance consistent with age of record HENT: normocephalic; atraumatic Eyes: pupils equal, round and reactive to light; patient unable to cooperate with extraocular muscle exam Neck: supple Heart: regular rate and rhythm Lungs: clear to auscultation bilaterally Abdomen: soft; nondistended; nontender; no masses or hepatosplenomegaly; bowel sounds present Extremities: No deformity; full range of motion; pulses normal; no edema Neurologic: Awake, alert and oriented x 2; motor function intact in all extremities and symmetric; no facial droop Skin: Warm and dry    ED Course  Procedures (including critical care time)  CRITICAL CARE Performed by: Ritchard Paragas L Total critical care time: 30 minutes Critical care time was exclusive of separately billable procedures and treating other patients. Critical care was necessary to treat or prevent imminent or life-threatening deterioration. Critical care was time spent personally by me on the following activities: development of treatment plan with patient and/or surrogate as well as nursing, discussions with consultants, evaluation of patient's response to treatment, examination of patient, obtaining  history from patient or surrogate, ordering and performing treatments and interventions, ordering and review of laboratory studies, ordering and review of radiographic studies, pulse oximetry and re-evaluation of patient's condition.   MDM   Nursing notes and vitals signs, including pulse oximetry,  reviewed.  Summary of this visit's results, reviewed by myself:  Labs:  Results for orders placed during the hospital encounter of 11/09/13 (from the past 24 hour(s))  URINALYSIS, ROUTINE W REFLEX MICROSCOPIC     Status: Abnormal   Collection Time    11/09/13 10:58 PM      Result Value Ref Range   Color, Urine RED (*) YELLOW   APPearance CLOUDY (*) CLEAR   Specific Gravity, Urine 1.011  1.005 - 1.030   pH 7.5  5.0 - 8.0   Glucose, UA NEGATIVE  NEGATIVE mg/dL   Hgb urine dipstick LARGE (*) NEGATIVE   Bilirubin Urine MODERATE (*) NEGATIVE   Ketones, ur 15 (*) NEGATIVE mg/dL   Protein, ur 100 (*) NEGATIVE mg/dL   Urobilinogen, UA 1.0  0.0 - 1.0 mg/dL   Nitrite NEGATIVE  NEGATIVE   Leukocytes, UA TRACE (*) NEGATIVE  URINE MICROSCOPIC-ADD ON     Status: Abnormal   Collection Time    11/09/13 10:58 PM      Result Value Ref Range   Squamous Epithelial / LPF RARE  RARE   WBC, UA 3-6  <3 WBC/hpf   RBC / HPF 0-2  <3 RBC/hpf   Bacteria, UA FEW (*) RARE   Urine-Other AMORPHOUS URATES/PHOSPHATES    CBC WITH DIFFERENTIAL     Status: Abnormal (Preliminary result)   Collection Time    11/09/13 11:10 PM      Result Value Ref Range   WBC 2.4 (*) 4.0 - 10.5 K/uL   RBC 2.19 (*) 4.22 - 5.81 MIL/uL   Hemoglobin 7.3 (*) 13.0 - 17.0 g/dL   HCT 20.2 (*) 39.0 - 52.0 %   MCV 92.2  78.0 - 100.0 fL   MCH 33.3  26.0 - 34.0 pg   MCHC 36.1 (*) 30.0 - 36.0 g/dL   RDW 18.6 (*) 11.5 - 15.5 %   Platelets PENDING  150 - 400 K/uL   Neutrophils Relative % 60  43 - 77 %   Neutro Abs 1.4 (*) 1.7 - 7.7 K/uL   Lymphocytes Relative 23  12 - 46 %   Lymphs Abs 0.5 (*) 0.7 - 4.0 K/uL   Monocytes Relative 18 (*) 3 -  12 %   Monocytes Absolute 0.4  0.1 - 1.0 K/uL   Eosinophils Relative 0  0 - 5 %   Eosinophils Absolute 0.0  0.0 - 0.7 K/uL   Basophils Relative 0  0 - 1 %   Basophils Absolute 0.0  0.0 - 0.1 K/uL  CK     Status: None   Collection Time    11/09/13 11:10 PM      Result Value Ref Range   Total CK 75  7 - 232 U/L  HEPATIC FUNCTION PANEL     Status: Abnormal   Collection Time    11/09/13 11:10 PM      Result Value Ref Range   Total Protein 5.9 (*) 6.0 - 8.3 g/dL   Albumin 3.5  3.5 - 5.2 g/dL   AST 42 (*) 0 - 37 U/L   ALT 14  0 - 53 U/L   Alkaline Phosphatase 98  39 - 117 U/L   Total Bilirubin 2.8 (*) 0.3 - 1.2 mg/dL   Bilirubin, Direct 0.3  0.0 - 0.3 mg/dL   Indirect Bilirubin 2.5 (*) 0.3 - 0.9 mg/dL  Randolm Idol, ED     Status: None   Collection Time    11/09/13 11:17 PM  Result Value Ref Range   Troponin i, poc 0.02  0.00 - 0.08 ng/mL   Comment 3           I-STAT CHEM 8, ED     Status: Abnormal   Collection Time    11/09/13 11:18 PM      Result Value Ref Range   Sodium 129 (*) 137 - 147 mEq/L   Potassium 3.4 (*) 3.7 - 5.3 mEq/L   Chloride 94 (*) 96 - 112 mEq/L   BUN 19  6 - 23 mg/dL   Creatinine, Ser 1.40 (*) 0.50 - 1.35 mg/dL   Glucose, Bld 118 (*) 70 - 99 mg/dL   Calcium, Ion 1.03 (*) 1.13 - 1.30 mmol/L   TCO2 21  0 - 100 mmol/L   Hemoglobin 7.1 (*) 13.0 - 17.0 g/dL   HCT 21.0 (*) 39.0 - 52.0 %  I-STAT CG4 LACTIC ACID, ED     Status: None   Collection Time    11/09/13 11:19 PM      Result Value Ref Range   Lactic Acid, Venous 1.37  0.5 - 2.2 mmol/L    Imaging Studies: Dg Chest Port 1 View  11/09/2013   CLINICAL DATA:  Altered mental status, history of multiple myeloma.  EXAM: PORTABLE CHEST - 1 VIEW  COMPARISON:  DG BONE SURVEY MET dated 07/21/2013  FINDINGS: The cardiac silhouette appears mildly enlarged, slightly more conspicuous than prior examination. Mild interstitial prominence without pleural effusions or focal consolidation. 3 mm nodule again seen in  right lung base which may reflect granuloma. No pneumothorax.  Patient is osteopenic. Multiple tiny lytic lesions appear slightly worse though, not tailored for evaluation.  IMPRESSION: Mild cardiomegaly and mild interstitial prominence suggest pulmonary edema without focal consolidation.   Electronically Signed   By: Elon Alas   On: 11/09/2013 23:26   12:00 AM Zosyn and vancomycin ordered for possible early sepsis.  12:04 AM Patient's fever improved after acetaminophen. Patient now oriented x3.    Wynetta Fines, MD 11/10/13 0001  Wynetta Fines, MD 11/10/13 [phone]

## 2013-11-09 NOTE — ED Notes (Signed)
Per EMS Pt had witness syncopal episode in the bathroom. Pt also is experiencing altered mental status. Pt alert to self and place. Pt disoriented to time and situation. Pt has rectal temp of 104.2 Dr. Florina Ou aware. 20 gauge in left hand.

## 2013-11-10 ENCOUNTER — Other Ambulatory Visit: Payer: Medicare Other

## 2013-11-10 ENCOUNTER — Inpatient Hospital Stay (HOSPITAL_COMMUNITY): Payer: Medicare Other

## 2013-11-10 ENCOUNTER — Ambulatory Visit: Payer: Medicare Other

## 2013-11-10 ENCOUNTER — Encounter (HOSPITAL_COMMUNITY): Payer: Self-pay | Admitting: Internal Medicine

## 2013-11-10 DIAGNOSIS — C9000 Multiple myeloma not having achieved remission: Secondary | ICD-10-CM

## 2013-11-10 DIAGNOSIS — D599 Acquired hemolytic anemia, unspecified: Secondary | ICD-10-CM | POA: Diagnosis present

## 2013-11-10 DIAGNOSIS — I10 Essential (primary) hypertension: Secondary | ICD-10-CM | POA: Diagnosis present

## 2013-11-10 DIAGNOSIS — N189 Chronic kidney disease, unspecified: Secondary | ICD-10-CM | POA: Diagnosis present

## 2013-11-10 DIAGNOSIS — R300 Dysuria: Secondary | ICD-10-CM

## 2013-11-10 DIAGNOSIS — D61810 Antineoplastic chemotherapy induced pancytopenia: Secondary | ICD-10-CM | POA: Diagnosis present

## 2013-11-10 DIAGNOSIS — R509 Fever, unspecified: Secondary | ICD-10-CM | POA: Diagnosis present

## 2013-11-10 DIAGNOSIS — I129 Hypertensive chronic kidney disease with stage 1 through stage 4 chronic kidney disease, or unspecified chronic kidney disease: Secondary | ICD-10-CM | POA: Diagnosis present

## 2013-11-10 DIAGNOSIS — T451X5A Adverse effect of antineoplastic and immunosuppressive drugs, initial encounter: Secondary | ICD-10-CM | POA: Diagnosis present

## 2013-11-10 DIAGNOSIS — D801 Nonfamilial hypogammaglobulinemia: Secondary | ICD-10-CM | POA: Diagnosis present

## 2013-11-10 DIAGNOSIS — R6520 Severe sepsis without septic shock: Secondary | ICD-10-CM | POA: Diagnosis present

## 2013-11-10 DIAGNOSIS — J811 Chronic pulmonary edema: Secondary | ICD-10-CM

## 2013-11-10 DIAGNOSIS — G9340 Encephalopathy, unspecified: Secondary | ICD-10-CM | POA: Diagnosis present

## 2013-11-10 DIAGNOSIS — J9600 Acute respiratory failure, unspecified whether with hypoxia or hypercapnia: Secondary | ICD-10-CM | POA: Diagnosis present

## 2013-11-10 DIAGNOSIS — I059 Rheumatic mitral valve disease, unspecified: Secondary | ICD-10-CM

## 2013-11-10 DIAGNOSIS — E876 Hypokalemia: Secondary | ICD-10-CM | POA: Diagnosis not present

## 2013-11-10 DIAGNOSIS — N400 Enlarged prostate without lower urinary tract symptoms: Secondary | ICD-10-CM | POA: Diagnosis present

## 2013-11-10 DIAGNOSIS — T8092XA Unspecified transfusion reaction, initial encounter: Secondary | ICD-10-CM | POA: Diagnosis present

## 2013-11-10 DIAGNOSIS — R011 Cardiac murmur, unspecified: Secondary | ICD-10-CM | POA: Diagnosis present

## 2013-11-10 DIAGNOSIS — R55 Syncope and collapse: Secondary | ICD-10-CM | POA: Diagnosis present

## 2013-11-10 DIAGNOSIS — R630 Anorexia: Secondary | ICD-10-CM | POA: Diagnosis present

## 2013-11-10 DIAGNOSIS — Z8249 Family history of ischemic heart disease and other diseases of the circulatory system: Secondary | ICD-10-CM | POA: Diagnosis not present

## 2013-11-10 DIAGNOSIS — E860 Dehydration: Secondary | ICD-10-CM | POA: Diagnosis present

## 2013-11-10 DIAGNOSIS — H409 Unspecified glaucoma: Secondary | ICD-10-CM | POA: Diagnosis present

## 2013-11-10 DIAGNOSIS — A419 Sepsis, unspecified organism: Secondary | ICD-10-CM | POA: Diagnosis present

## 2013-11-10 DIAGNOSIS — E785 Hyperlipidemia, unspecified: Secondary | ICD-10-CM | POA: Diagnosis present

## 2013-11-10 DIAGNOSIS — Z85830 Personal history of malignant neoplasm of bone: Secondary | ICD-10-CM | POA: Diagnosis not present

## 2013-11-10 DIAGNOSIS — K219 Gastro-esophageal reflux disease without esophagitis: Secondary | ICD-10-CM | POA: Diagnosis present

## 2013-11-10 DIAGNOSIS — E874 Mixed disorder of acid-base balance: Secondary | ICD-10-CM | POA: Diagnosis not present

## 2013-11-10 DIAGNOSIS — Z9221 Personal history of antineoplastic chemotherapy: Secondary | ICD-10-CM | POA: Diagnosis not present

## 2013-11-10 DIAGNOSIS — I248 Other forms of acute ischemic heart disease: Secondary | ICD-10-CM | POA: Diagnosis not present

## 2013-11-10 DIAGNOSIS — R197 Diarrhea, unspecified: Secondary | ICD-10-CM | POA: Diagnosis present

## 2013-11-10 DIAGNOSIS — E871 Hypo-osmolality and hyponatremia: Secondary | ICD-10-CM | POA: Diagnosis not present

## 2013-11-10 DIAGNOSIS — J984 Other disorders of lung: Secondary | ICD-10-CM | POA: Diagnosis present

## 2013-11-10 DIAGNOSIS — N179 Acute kidney failure, unspecified: Secondary | ICD-10-CM | POA: Diagnosis present

## 2013-11-10 LAB — URINALYSIS W MICROSCOPIC (NOT AT ARMC)
GLUCOSE, UA: NEGATIVE mg/dL
Ketones, ur: 15 mg/dL — AB
Nitrite: POSITIVE — AB
PROTEIN: 100 mg/dL — AB
Specific Gravity, Urine: 1.011 (ref 1.005–1.030)
UROBILINOGEN UA: 0.2 mg/dL (ref 0.0–1.0)
pH: 6 (ref 5.0–8.0)

## 2013-11-10 LAB — CBC WITH DIFFERENTIAL/PLATELET
Basophils Absolute: 0 10*3/uL (ref 0.0–0.1)
Basophils Relative: 0 % (ref 0–1)
EOS PCT: 0 % (ref 0–5)
Eosinophils Absolute: 0 10*3/uL (ref 0.0–0.7)
HCT: 20.2 % — ABNORMAL LOW (ref 39.0–52.0)
HEMOGLOBIN: 7.3 g/dL — AB (ref 13.0–17.0)
LYMPHS ABS: 0.5 10*3/uL — AB (ref 0.7–4.0)
Lymphocytes Relative: 23 % (ref 12–46)
MCH: 33.3 pg (ref 26.0–34.0)
MCHC: 36.1 g/dL — ABNORMAL HIGH (ref 30.0–36.0)
MCV: 92.2 fL (ref 78.0–100.0)
MONOS PCT: 18 % — AB (ref 3–12)
Monocytes Absolute: 0.4 10*3/uL (ref 0.1–1.0)
NEUTROS PCT: 60 % (ref 43–77)
Neutro Abs: 1.4 10*3/uL — ABNORMAL LOW (ref 1.7–7.7)
PLATELETS: 18 10*3/uL — AB (ref 150–400)
RBC: 2.19 MIL/uL — AB (ref 4.22–5.81)
RDW: 18.6 % — ABNORMAL HIGH (ref 11.5–15.5)
WBC: 2.4 10*3/uL — ABNORMAL LOW (ref 4.0–10.5)

## 2013-11-10 LAB — PREPARE RBC (CROSSMATCH)

## 2013-11-10 LAB — COMPREHENSIVE METABOLIC PANEL
ALT: 15 U/L (ref 0–53)
AST: 50 U/L — AB (ref 0–37)
Albumin: 3.7 g/dL (ref 3.5–5.2)
Alkaline Phosphatase: 104 U/L (ref 39–117)
BILIRUBIN TOTAL: 3.7 mg/dL — AB (ref 0.3–1.2)
BUN: 20 mg/dL (ref 6–23)
CALCIUM: 8.2 mg/dL — AB (ref 8.4–10.5)
CHLORIDE: 94 meq/L — AB (ref 96–112)
CO2: 20 meq/L (ref 19–32)
Creatinine, Ser: 1.31 mg/dL (ref 0.50–1.35)
GFR, EST AFRICAN AMERICAN: 61 mL/min — AB (ref >90)
GFR, EST NON AFRICAN AMERICAN: 53 mL/min — AB (ref >90)
GLUCOSE: 105 mg/dL — AB (ref 70–99)
Potassium: 3.8 mEq/L (ref 3.7–5.3)
Sodium: 133 mEq/L — ABNORMAL LOW (ref 137–147)
Total Protein: 6.3 g/dL (ref 6.0–8.3)

## 2013-11-10 LAB — CBC
HCT: 20.8 % — ABNORMAL LOW (ref 39.0–52.0)
HEMOGLOBIN: 7.6 g/dL — AB (ref 13.0–17.0)
MCH: 34.4 pg — AB (ref 26.0–34.0)
MCHC: 36.5 g/dL — AB (ref 30.0–36.0)
MCV: 94.1 fL (ref 78.0–100.0)
PLATELETS: 19 10*3/uL — AB (ref 150–400)
RBC: 2.21 MIL/uL — ABNORMAL LOW (ref 4.22–5.81)
RDW: 19.4 % — ABNORMAL HIGH (ref 11.5–15.5)
WBC: 2.3 10*3/uL — ABNORMAL LOW (ref 4.0–10.5)

## 2013-11-10 LAB — BLOOD GAS, ARTERIAL
Acid-base deficit: 7.4 mmol/L — ABNORMAL HIGH (ref 0.0–2.0)
BICARBONATE: 15.6 meq/L — AB (ref 20.0–24.0)
Drawn by: 249101
O2 Content: 2 L/min
O2 SAT: 97.4 %
Patient temperature: 98.6
TCO2: 16.3 mmol/L (ref 0–100)
pCO2 arterial: 21.5 mmHg — ABNORMAL LOW (ref 35.0–45.0)
pH, Arterial: 7.475 — ABNORMAL HIGH (ref 7.350–7.450)
pO2, Arterial: 83.7 mmHg (ref 80.0–100.0)

## 2013-11-10 LAB — MAGNESIUM: Magnesium: 2 mg/dL (ref 1.5–2.5)

## 2013-11-10 LAB — TROPONIN I
TROPONIN I: 0.4 ng/mL — AB (ref <0.30)
Troponin I: 0.73 ng/mL (ref <0.30)

## 2013-11-10 LAB — INFLUENZA PANEL BY PCR (TYPE A & B)
H1N1FLUPCR: NOT DETECTED
INFLBPCR: NEGATIVE
Influenza A By PCR: NEGATIVE

## 2013-11-10 LAB — HAPTOGLOBIN

## 2013-11-10 LAB — PHOSPHORUS: Phosphorus: 2.5 mg/dL (ref 2.3–4.6)

## 2013-11-10 LAB — TSH: TSH: 0.618 u[IU]/mL (ref 0.350–4.500)

## 2013-11-10 MED ORDER — PANTOPRAZOLE SODIUM 40 MG PO TBEC
40.0000 mg | DELAYED_RELEASE_TABLET | Freq: Every day | ORAL | Status: DC
  Administered 2013-11-10 – 2013-11-17 (×8): 40 mg via ORAL
  Filled 2013-11-10 (×9): qty 1

## 2013-11-10 MED ORDER — FUROSEMIDE 10 MG/ML IJ SOLN: 20.0000 mg | Freq: Once | INTRAMUSCULAR | Status: DC

## 2013-11-10 MED ORDER — ONDANSETRON HCL 4 MG PO TABS
4.0000 mg | ORAL_TABLET | Freq: Four times a day (QID) | ORAL | Status: DC | PRN
  Administered 2013-11-12 – 2013-11-17 (×7): 4 mg via ORAL
  Filled 2013-11-10 (×7): qty 1

## 2013-11-10 MED ORDER — DIPHENHYDRAMINE HCL 25 MG PO CAPS
25.0000 mg | ORAL_CAPSULE | Freq: Once | ORAL | Status: DC
Start: 2013-11-10 — End: 2013-11-10
  Filled 2013-11-10 (×2): qty 1

## 2013-11-10 MED ORDER — DIPHENHYDRAMINE HCL 50 MG/ML IJ SOLN
25.0000 mg | Freq: Once | INTRAMUSCULAR | Status: AC
  Administered 2013-11-10: 25 mg via INTRAVENOUS
  Filled 2013-11-10: qty 1

## 2013-11-10 MED ORDER — DOCUSATE SODIUM 100 MG PO CAPS
100.0000 mg | ORAL_CAPSULE | Freq: Two times a day (BID) | ORAL | Status: DC
  Administered 2013-11-10 – 2013-11-17 (×8): 100 mg via ORAL
  Filled 2013-11-10 (×15): qty 1

## 2013-11-10 MED ORDER — LATANOPROST 0.005 % OP SOLN
1.0000 [drp] | Freq: Every day | OPHTHALMIC | Status: DC
  Administered 2013-11-10 – 2013-11-16 (×7): 1 [drp] via OPHTHALMIC
  Filled 2013-11-10 (×2): qty 2.5

## 2013-11-10 MED ORDER — TIMOLOL MALEATE 0.5 % OP SOLN
1.0000 [drp] | Freq: Two times a day (BID) | OPHTHALMIC | Status: DC
  Administered 2013-11-10 – 2013-11-17 (×13): 1 [drp] via OPHTHALMIC
  Filled 2013-11-10 (×3): qty 5

## 2013-11-10 MED ORDER — ACETAMINOPHEN 650 MG RE SUPP: 650.0000 mg | Freq: Four times a day (QID) | RECTAL | Status: DC | PRN

## 2013-11-10 MED ORDER — LORAZEPAM 0.5 MG PO TABS
0.5000 mg | ORAL_TABLET | Freq: Three times a day (TID) | ORAL | Status: DC | PRN
  Filled 2013-11-10: qty 1

## 2013-11-10 MED ORDER — SIMVASTATIN 20 MG PO TABS
20.0000 mg | ORAL_TABLET | Freq: Every day | ORAL | Status: DC
  Filled 2013-11-10: qty 1

## 2013-11-10 MED ORDER — CHLORTHALIDONE 25 MG PO TABS
25.0000 mg | ORAL_TABLET | Freq: Every day | ORAL | Status: DC
  Administered 2013-11-10: 25 mg via ORAL
  Filled 2013-11-10: qty 1

## 2013-11-10 MED ORDER — ACETAMINOPHEN 325 MG PO TABS
650.0000 mg | ORAL_TABLET | Freq: Once | ORAL | Status: AC
  Administered 2013-11-10: 650 mg via ORAL
  Filled 2013-11-10: qty 2

## 2013-11-10 MED ORDER — BRIMONIDINE TARTRATE-TIMOLOL 0.2-0.5 % OP SOLN: 1.0000 [drp] | Freq: Two times a day (BID) | OPHTHALMIC | Status: DC

## 2013-11-10 MED ORDER — SODIUM CHLORIDE 0.9 % IJ SOLN
3.0000 mL | Freq: Two times a day (BID) | INTRAMUSCULAR | Status: DC
  Administered 2013-11-10 – 2013-11-16 (×11): 3 mL via INTRAVENOUS

## 2013-11-10 MED ORDER — HYDROCODONE-ACETAMINOPHEN 5-325 MG PO TABS: 1.0000 | ORAL_TABLET | ORAL | Status: DC | PRN

## 2013-11-10 MED ORDER — ACETAMINOPHEN 325 MG PO TABS
325.0000 mg | ORAL_TABLET | Freq: Once | ORAL | Status: AC
  Administered 2013-11-10: 325 mg via ORAL

## 2013-11-10 MED ORDER — METHYLPREDNISOLONE SODIUM SUCC 125 MG IJ SOLR
125.0000 mg | Freq: Three times a day (TID) | INTRAMUSCULAR | Status: DC
  Administered 2013-11-10 – 2013-11-13 (×8): 125 mg via INTRAVENOUS
  Administered 2013-11-13: 62.5 mg via INTRAVENOUS
  Administered 2013-11-14 (×2): 125 mg via INTRAVENOUS
  Filled 2013-11-10 (×18): qty 2

## 2013-11-10 MED ORDER — ONDANSETRON HCL 4 MG/2ML IJ SOLN
4.0000 mg | Freq: Four times a day (QID) | INTRAMUSCULAR | Status: DC | PRN
  Administered 2013-11-11 – 2013-11-13 (×2): 4 mg via INTRAVENOUS
  Filled 2013-11-10 (×2): qty 2

## 2013-11-10 MED ORDER — SODIUM CHLORIDE 0.9 % IV SOLN: 500.0000 mL | Freq: Once | INTRAVENOUS | Status: DC

## 2013-11-10 MED ORDER — BRIMONIDINE TARTRATE 0.2 % OP SOLN
1.0000 [drp] | Freq: Two times a day (BID) | OPHTHALMIC | Status: DC
  Administered 2013-11-10 – 2013-11-17 (×14): 1 [drp] via OPHTHALMIC
  Filled 2013-11-10 (×3): qty 5

## 2013-11-10 MED ORDER — ACETAMINOPHEN 325 MG PO TABS
650.0000 mg | ORAL_TABLET | Freq: Four times a day (QID) | ORAL | Status: DC | PRN
  Administered 2013-11-10: 650 mg via ORAL
  Filled 2013-11-10 (×2): qty 2

## 2013-11-10 MED ORDER — FUROSEMIDE 10 MG/ML IJ SOLN
40.0000 mg | Freq: Once | INTRAMUSCULAR | Status: AC
  Administered 2013-11-10: 40 mg via INTRAVENOUS
  Filled 2013-11-10: qty 4

## 2013-11-10 MED ORDER — SODIUM CHLORIDE 0.9 % IV SOLN: INTRAVENOUS | Status: DC

## 2013-11-10 MED ORDER — ASPIRIN EC 325 MG PO TBEC
325.0000 mg | DELAYED_RELEASE_TABLET | Freq: Every day | ORAL | Status: DC
  Administered 2013-11-11 – 2013-11-17 (×8): 325 mg via ORAL
  Filled 2013-11-10 (×8): qty 1

## 2013-11-10 MED ORDER — ATENOLOL 50 MG PO TABS
50.0000 mg | ORAL_TABLET | Freq: Every day | ORAL | Status: DC
  Administered 2013-11-10: 50 mg via ORAL
  Filled 2013-11-10: qty 1

## 2013-11-10 MED ORDER — VANCOMYCIN HCL IN DEXTROSE 1-5 GM/200ML-% IV SOLN
1000.0000 mg | INTRAVENOUS | Status: DC
  Administered 2013-11-11: 1000 mg via INTRAVENOUS
  Filled 2013-11-10: qty 200

## 2013-11-10 MED ORDER — OSELTAMIVIR PHOSPHATE 30 MG PO CAPS
30.0000 mg | ORAL_CAPSULE | Freq: Two times a day (BID) | ORAL | Status: DC
  Administered 2013-11-10 – 2013-11-11 (×3): 30 mg via ORAL
  Filled 2013-11-10 (×5): qty 1

## 2013-11-10 MED ORDER — PIPERACILLIN-TAZOBACTAM 3.375 G IVPB
3.3750 g | Freq: Three times a day (TID) | INTRAVENOUS | Status: DC
Start: 2013-11-10 — End: 2013-11-12
  Administered 2013-11-10 – 2013-11-12 (×7): 3.375 g via INTRAVENOUS
  Filled 2013-11-10 (×12): qty 50

## 2013-11-10 MED ORDER — LORATADINE 10 MG PO TABS: 10.0000 mg | ORAL_TABLET | Freq: Every day | ORAL | Status: DC | PRN

## 2013-11-10 MED ORDER — POTASSIUM CHLORIDE CRYS ER 20 MEQ PO TBCR
20.0000 meq | EXTENDED_RELEASE_TABLET | Freq: Every day | ORAL | Status: DC
  Administered 2013-11-10 – 2013-11-12 (×3): 20 meq via ORAL
  Filled 2013-11-10 (×4): qty 1

## 2013-11-10 MED ORDER — ATENOLOL-CHLORTHALIDONE 50-25 MG PO TABS: 1.0000 | ORAL_TABLET | Freq: Every day | ORAL | Status: DC

## 2013-11-10 MED ORDER — PROCHLORPERAZINE MALEATE 10 MG PO TABS
10.0000 mg | ORAL_TABLET | Freq: Four times a day (QID) | ORAL | Status: DC | PRN
  Filled 2013-11-10: qty 1

## 2013-11-10 MED ORDER — ACYCLOVIR 400 MG PO TABS
400.0000 mg | ORAL_TABLET | Freq: Two times a day (BID) | ORAL | Status: DC
  Administered 2013-11-10 – 2013-11-14 (×9): 400 mg via ORAL
  Filled 2013-11-10 (×11): qty 1

## 2013-11-10 MED ORDER — ALLOPURINOL 100 MG PO TABS
100.0000 mg | ORAL_TABLET | Freq: Every day | ORAL | Status: DC
  Administered 2013-11-10 – 2013-11-17 (×8): 100 mg via ORAL
  Filled 2013-11-10 (×8): qty 1

## 2013-11-10 MED ORDER — DIPHENHYDRAMINE HCL 25 MG PO CAPS
25.0000 mg | ORAL_CAPSULE | Freq: Once | ORAL | Status: AC
  Administered 2013-11-10: 25 mg via ORAL

## 2013-11-10 NOTE — Progress Notes (Signed)
eLink Physician-Brief Progress Note Patient Name: Trevor Michael DOB: 12/20/1940 MRN: [id number]  Date of Service  11/10/2013   HPI/Events of Note  Patient with elevation in trop up to 0.73.  HD stable with bradycardia.  2D ECHO performed today.   eICU Interventions  Plan: Start ASA Hold on BB due to HR    Intervention Category Intermediate Interventions: Best-practice therapies (e.g. DVT, beta blocker, etc.)  Lianny Molter 11/10/2013, 11:52 PM

## 2013-11-10 NOTE — Progress Notes (Signed)
Notified by blood bank that they are still working to get his units of blood ready. They will notify us when ready.  Will continue to monitor.

## 2013-11-10 NOTE — H&P (Signed)
PCP:  Adella Hare, MD  Oncology: Dr. Juliann Mule  Chief Complaint:  syncope  HPI: Trevor Michael is a 73 y.o. male   has a past medical history of Other and unspecified hyperlipidemia; Essential hypertension, benign; BPH (benign prostatic hyperplasia); Early stage glaucoma; Heart murmur; Cervical stenosis of spine; GERD (gastroesophageal reflux disease); Bone cancer; Spinal stenosis; Chronic kidney disease; Arthritis; DDD (degenerative disc disease); Cancer (05/2012); Anorexia (06/25/12); Diverticulitis; and History of chemotherapy (07/01/12).   Patient with hx of Multiple myeloma sp stem cell transplant done at [facility] 06/14. He is being followed by oncology locally and is currently undergoing active chemotherapy treatments followed by Dr. Juliann Mule he have had severe anemia related to chemo and have had blood transfusion 2 weeks ago. His blood counts have been closely monitored and he was seen just few days ago at that point Dr. Juliann Mule held off on transfusion given that he was doing well. Today family noted fever up to 101 that self resolved at first. He have had decreased PO intake and had some diarrhea as well after taking laxatives for constipation. Patient had a syncopal episode while trying to get up to use the bathroom. Patient also took half a sleeping pill prior to syncopal event as well. On arrival to ER he was found to be febrile up to 104.2. Patient denies any recent cough, denies any headache, denies meningismus. Patient was started empirically on broad spectrum antibiotics and Hospitalist called for admission.   Review of Systems:     Pertinent positives include: Fevers, chills, diarrhea, left arm pain  Constitutional:  No weight loss, night sweats,  fatigue, weight loss  HEENT:  No headaches, Difficulty swallowing,Tooth/dental problems,Sore throat,  No sneezing, itching, ear ache, nasal congestion, post nasal drip,  Cardio-vascular:  No chest pain, Orthopnea, PND, anasarca, dizziness,  palpitations.no Bilateral lower extremity swelling  GI:  No heartburn, indigestion, abdominal pain, nausea, vomiting,  change in bowel habits, loss of appetite, melena, blood in stool, hematemesis Resp:  no shortness of breath at rest. No dyspnea on exertion, No excess mucus, no productive cough, No non-productive cough, No coughing up of blood.No change in color of mucus.No wheezing. Skin:  no rash or lesions. No jaundice GU:  no dysuria, change in color of urine, no urgency or frequency. No straining to urinate.  No flank pain.  Musculoskeletal:  No joint pain or no joint swelling. No decreased range of motion. No back pain.  Psych:  No change in mood or affect. No depression or anxiety. No memory loss.  Neuro: no localizing neurological complaints, no tingling, no weakness, no double vision, no gait abnormality, no slurred speech, no confusion  Otherwise ROS are negative except for above, 10 systems were reviewed  Past Medical History: Past Medical History  Diagnosis Date  . Other and unspecified hyperlipidemia   . Essential hypertension, benign   . BPH (benign prostatic hyperplasia)   . Early stage glaucoma   . Heart murmur     "since my teens" (06/18/2012)  . Cervical stenosis of spine   . GERD (gastroesophageal reflux disease)   . Bone cancer     multiple bone lesion/right humerus  . Spinal stenosis   . Chronic kidney disease     renal insufficiency  . Arthritis   . DDD (degenerative disc disease)     c-5=-c6, and spondylosis  . Cancer 05/2012    IgA lambda multiple myeloma  . Anorexia 06/25/12    1 month hx   . Diverticulitis   .  History of chemotherapy 07/01/12    Velcade,Cytoxin and decadron   Past Surgical History  Procedure Laterality Date  . Lipoma excision  07/1986    left shoulder/scapula  . Esophagogastroduodenoscopy  06/20/2012    Procedure: ESOPHAGOGASTRODUODENOSCOPY (EGD);  Surgeon: Beryle Beams, MD;  Location: [facility] ENDOSCOPY;  Service: Endoscopy;   Laterality: N/A;  . Prostate biopsy  2008    benign     Medications: Prior to Admission medications   Medication Sig Start Date End Date Taking? Authorizing Provider  acetaminophen (TYLENOL) 500 MG tablet Take 1,000 mg by mouth every 6 (six) hours as needed. For pain    Historical Provider, MD  acyclovir (ZOVIRAX) 400 MG tablet Take 400 mg by mouth 2 (two) times daily.    Historical Provider, MD  allopurinol (ZYLOPRIM) 100 MG tablet Take 100 mg by mouth daily.    Historical Provider, MD  atenolol-chlorthalidone (TENORETIC) 50-25 MG per tablet Take 1 tablet by mouth daily.    Historical Provider, MD  bimatoprost (LUMIGAN) 0.03 % ophthalmic drops Place 1 drop into both eyes at bedtime.      Historical Provider, MD  brimonidine-timolol (COMBIGAN) 0.2-0.5 % ophthalmic solution Place 1 drop into both eyes 2 (two) times daily.      Historical Provider, MD  dexamethasone (DECADRON) 4 MG tablet Take 40 mg by mouth as directed. Takes 4 tablets on days 18,15,and 22 of chemo last dose was 11/03/13    Historical Provider, MD  esomeprazole (NEXIUM) 40 MG capsule Take 40 mg by mouth daily at 12 noon.    Historical Provider, MD  lenalidomide (REVLIMID) 10 MG capsule Take 10 mg by mouth daily.    Historical Provider, MD  loperamide (IMODIUM) 2 MG capsule Take 2 mg by mouth as needed for diarrhea or loose stools.    Historical Provider, MD  loratadine (CLARITIN) 10 MG tablet Take 10 mg by mouth daily as needed for allergies.    Historical Provider, MD  potassium chloride SA (K-DUR,KLOR-CON) 20 MEQ tablet Take 20 mEq by mouth daily.    Historical Provider, MD  PRESCRIPTION MEDICATION Receiving chemotherapy at [facility] (Dr. Juliann Mule) with carfilzomib (Kyprolis) and dexamethasone.  Last dose was given 11/04/13.    Historical Provider, MD  prochlorperazine (COMPAZINE) 10 MG tablet Take 10 mg by mouth every 6 (six) hours as needed for nausea or vomiting.    Historical Provider, MD  senna (SENOKOT) 8.6 MG TABS tablet Take 1  tablet by mouth daily as needed for mild constipation.    Historical Provider, MD  simvastatin (ZOCOR) 20 MG tablet Take 20 mg by mouth at bedtime.    Historical Provider, MD    Allergies:  No Known Allergies  Social History:  Ambulatory independently Lives at home with wife   reports that he has never smoked. He has never used smokeless tobacco. He reports that he does not drink alcohol or use illicit drugs.   Family History: family history includes Benign prostatic hyperplasia in his brother and father; Coronary artery disease in his father; Heart disease in his brother; Pneumonia in his father.    Physical Exam: Patient Vitals for the past 24 hrs:  BP Temp Temp src Pulse Resp SpO2  11/10/13 0026 - 103.1 F (39.5 C) Rectal - - -  11/10/13 0000 131/83 mmHg - - 69 19 98 %  11/09/13 2315 153/50 mmHg - - 84 20 100 %  11/09/13 2256 154/57 mmHg 104.2 F (40.1 C) Rectal - 24 97 %    1.  General:  in No Acute distress 2. Psychological: Alert and Oriented 3. Head/ENT:     Dry Mucous Membranes                          Head Non traumatic, neck supple                          Normal   Dentition 4. SKIN:  decreased Skin turgor,  Skin clean Dry and intact no rash 5. Heart: Regular rate and rhythm no Murmur, Rub or gallop 6. Lungs: Clear to auscultation bilaterally, no wheezes or crackles   7. Abdomen: Soft, non-tender, Non distended 8. Lower extremities: no clubbing, cyanosis, or edema 9. Neurologically Grossly intact, moving all 4 extremities equally 10. MSK: Normal range of motion  body mass index is unknown because there is no weight on file.   Labs on Admission:   Recent Labs  11/08/13 1300 11/09/13 2318  NA 140 129*  K 3.8 3.4*  CL 100 94*  CO2 25  --   GLUCOSE 100* 118*  BUN 15 19  CREATININE 1.10 1.40*  CALCIUM 8.1*  --     Recent Labs  11/09/13 2310  AST 42*  ALT 14  ALKPHOS 98  BILITOT 2.8*  PROT 5.9*  ALBUMIN 3.5   No results found for this  basename: LIPASE, AMYLASE,  in the last 72 hours  Recent Labs  11/08/13 1300 11/09/13 2310 11/09/13 2318  WBC 2.3* 2.4*  --   NEUTROABS 1.4* 1.4*  --   HGB 8.1* 7.3* 7.1*  HCT 23.4* 20.2* 21.0*  MCV 92.9 92.2  --   PLT 21* 18*  --     Recent Labs  11/09/13 2310  CKTOTAL 75   No results found for this basename: TSH, T4TOTAL, FREET3, T3FREE, THYROIDAB,  in the last 72 hours No results found for this basename: VITAMINB12, FOLATE, FERRITIN, TIBC, IRON, RETICCTPCT,  in the last 72 hours No results found for this basename: HGBA1C    The CrCl is unknown because both a height and weight (above a minimum accepted value) are required for this calculation. ABG    Component Value Date/Time   TCO2 21 11/09/2013 2318     No results found for this basename: DDIMER     Other results:  I have pearsonaly reviewed this: ECG REPORT  Rate: 70  Rhythm: NSR ST&T Change: t wave inversions in multiple leads   UA no UTI    Cultures:    Component Value Date/Time   SDES STOOL 06/20/2012 2037   [place] NONE 06/20/2012 2037   REPTSTATUS 06/24/2012 FINAL 06/20/2012 2037       Radiological Exams on Admission: Dg Chest Port 1 View  11/09/2013   CLINICAL DATA:  Altered mental status, history of multiple myeloma.  EXAM: PORTABLE CHEST - 1 VIEW  COMPARISON:  DG BONE SURVEY MET dated 07/21/2013  FINDINGS: The cardiac silhouette appears mildly enlarged, slightly more conspicuous than prior examination. Mild interstitial prominence without pleural effusions or focal consolidation. 3 mm nodule again seen in right lung base which may reflect granuloma. No pneumothorax.  Patient is osteopenic. Multiple tiny lytic lesions appear slightly worse though, not tailored for evaluation.  IMPRESSION: Mild cardiomegaly and mild interstitial prominence suggest pulmonary edema without focal consolidation.   Electronically Signed   By: Elon Alas   On: 11/09/2013 23:26    Chart has been  reviewed  Assessment/Plan  73 yo M w history of multiple myeloma sp stem cell transplant actively undergoing chemotherapy presents with febrile illness and syncope in the setting of pancytopenia   Present on Admission:  . Fever  - broadly with vancomycin and Zosyn M.D. blood cultures pending, repeat chest x-ray in the morning. obtain influenza PCR at this point there is no evidence of sepsis  . HYPERTENSION, MILD - continue home medications  . Multiple myeloma -will need oncology consult in a.m.  Marland Kitchen Syncope - in the setting of dehydration and high fever. We'll cycle cardiac enzymes obtain serial EKGs and echo gram patient has no history of heart murmur  . Dehydration - give IV fluids Left arm pain given history of lytic bone lesion to the right arm will image left arm today  Anemia - we'll transfuse 2 units given symptomatic anemia Thrombocytopenia currently at baseline but given febrile Illness and may need transfusion as well we'll discuss with oncology in the morning Prophylaxis: SCD , Protonix  CODE STATUS: FULL CODE  Other plan as per orders.  I have spent a total of 55 min on this admission  Hilton Saephan 11/10/2013, 12:33 AM

## 2013-11-10 NOTE — Progress Notes (Signed)
[facility] PROGRESS NOTE  Trevor Michael UGQ:[id number] DOB: July 08, 1941 DOA: 11/09/2013 PCP: Adella Hare, MD  Assessment/Plan: 1. Sepsis, present on admission, evidenced by a temperature 104.2, acute encephalopathy, leukopenia. Possible source of infection it is a urinary tract. Will continue broad-spectrum IV antibiotic therapy, followup on urine cultures and blood cultures. Provide IV fluid resuscitation. 2. Acute on chronic anemia, secondary to multiple myeloma as well as chemotherapy. Patient had hemoglobin of 7.6 however reports generalized weakness, malaise, fatigue. He will be transfused 2 units of packed red blood cells today. 3. Thrombocytopenia. Patient having a low platelet count of 19,000. He was seen by medical oncology, who felt that TTP should also be in the differential. Haptoglobin, LDH pending at the time of this dictation. 4. Multiple myeloma. Patient acutely ill, chemotherapy will need to be held. Medical oncology following, appreciate recommendations. 5. Dehydration. Patient having minimal by mouth intake over the last several days, will provide IV fluid resuscitation  Code Status: SCDs Family Communication:  Disposition Plan: Plan to transfuse packed red blood cells and platelets   Consultants:  Medical oncology  Procedures:  Transfusion of packed red blood cells and platelets  Antibiotics:  Vancomycin  Zosyn  HPI/Subjective: Patient is a 73 year old gentleman with a past medical history of multiple myeloma, ISS STAGE III with history of lytic lesions in the calvaria and right hemipelvis, currently undergoing chemotherapy at the [facility], admitted to the medicine service on 11/10/2013 presenting with complaints of generalized weakness, malaise, fevers and chills. Patient was found to have a temperature 104, started on broad-spectrum IV antibiotic therapy with vancomycin and Zosyn. Urine showing the presence of leukocyte esterase along with bacteria.  Chest x-ray did not reveal acute cardiopulmonary disease. Symptoms attributed to urosepsis. Patient was seen and evaluated by Dr.Chism of medical oncology. Patient on 11/10/2013 was typed and transfused packed red blood cells and platelets, as patient had presented with symptomatic anemia.  Objective: Filed Vitals:   11/10/13 1515  BP: 128/58  Pulse: 67  Temp: 99.5 F (37.5 C)  Resp: 16    Intake/Output Summary (Last 24 hours) at 11/10/13 1621 Last data filed at 11/10/13 1500  Gross per 24 hour  Intake   12.5 ml  Output    475 ml  Net -462.5 ml   Filed Weights   11/10/13 0148  Weight: 69.083 kg (152 lb 4.8 oz)    Exam:   General:  Ill-appearing, mildly lethargic, no acute distress  Cardiovascular: Regular rate rhythm normal S1-S2  Respiratory: Normal respiratory effort lungs are clear  Abdomen: Soft nontender not  Musculoskeletal: No edema  Data Reviewed: Basic Metabolic Panel:  Recent Labs Lab 11/08/13 1300 11/09/13 2318 11/10/13 0341  NA 140 129* 133*  K 3.8 3.4* 3.8  CL 100 94* 94*  CO2 25  --  20  GLUCOSE 100* 118* 105*  BUN 15 19 20   CREATININE 1.10 1.40* 1.31  CALCIUM 8.1*  --  8.2*  MG  --   --  2.0  PHOS  --   --  2.5   Liver Function Tests:  Recent Labs Lab 11/09/13 2310 11/10/13 0341  AST 42* 50*  ALT 14 15  ALKPHOS 98 104  BILITOT 2.8* 3.7*  PROT 5.9* 6.3  ALBUMIN 3.5 3.7   No results found for this basename: LIPASE, AMYLASE,  in the last 168 hours No results found for this basename: AMMONIA,  in the last 168 hours CBC:  Recent Labs Lab 11/06/13 1027 11/08/13 1300 11/09/13 2310  11/09/13 2318 11/10/13 0341  WBC 2.6* 2.3* 2.4*  --  2.3*  NEUTROABS 1.2* 1.4* 1.4*  --   --   HGB 7.9* 8.1* 7.3* 7.1* 7.6*  HCT 23.3* 23.4* 20.2* 21.0* 20.8*  MCV 91.4 92.9 92.2  --  94.1  PLT 22* 21* 18*  --  19*   Cardiac Enzymes:  Recent Labs Lab 11/09/13 2310 11/10/13 0341 11/10/13 1213  CKTOTAL 75  --   --   TROPONINI  --  <0.30  <0.30   BNP (last 3 results) No results found for this basename: PROBNP,  in the last 8760 hours CBG: No results found for this basename: GLUCAP,  in the last 168 hours  No results found for this or any previous visit (from the past 240 hour(s)).   Studies: X-ray Chest Pa And Lateral   11/10/2013   CLINICAL DATA:  Fever.  EXAM: CHEST  2 VIEW  COMPARISON:  11/09/2013  FINDINGS: The heart size and mediastinal contours are within normal limits. Both lungs are clear. The visualized skeletal structures are unremarkable.  IMPRESSION: No active cardiopulmonary disease.   Electronically Signed   By: Earle Gell M.D.   On: 11/10/2013 13:55   Dg Chest Port 1 View  11/09/2013   CLINICAL DATA:  Altered mental status, history of multiple myeloma.  EXAM: PORTABLE CHEST - 1 VIEW  COMPARISON:  DG BONE SURVEY MET dated 07/21/2013  FINDINGS: The cardiac silhouette appears mildly enlarged, slightly more conspicuous than prior examination. Mild interstitial prominence without pleural effusions or focal consolidation. 3 mm nodule again seen in right lung base which may reflect granuloma. No pneumothorax.  Patient is osteopenic. Multiple tiny lytic lesions appear slightly worse though, not tailored for evaluation.  IMPRESSION: Mild cardiomegaly and mild interstitial prominence suggest pulmonary edema without focal consolidation.   Electronically Signed   By: Elon Alas   On: 11/09/2013 23:26    Scheduled Meds: . acyclovir  400 mg Oral BID  . allopurinol  100 mg Oral Daily  . atenolol  50 mg Oral Daily  . brimonidine  1 drop Both Eyes BID  . chlorthalidone  25 mg Oral Daily  . diphenhydrAMINE  25 mg Oral Once  . docusate sodium  100 mg Oral BID  . latanoprost  1 drop Both Eyes QHS  . oseltamivir  30 mg Oral BID  . pantoprazole  40 mg Oral Daily  . piperacillin-tazobactam  3.375 g Intravenous 3 times per day  . potassium chloride SA  20 mEq Oral Daily  . simvastatin  20 mg Oral QHS  . sodium chloride  3  mL Intravenous Q12H  . timolol  1 drop Both Eyes BID  . [START ON 11/11/2013] vancomycin  1,000 mg Intravenous Q24H   Continuous Infusions: . sodium chloride 150 mL/hr at 11/10/13 0900    Active Problems:   HYPERTENSION, MILD   Multiple myeloma   Fever   Syncope   Dehydration    Time spent: 35 minutes    Kelvin Cellar  [facility] Pager [phone]. If 7PM-7AM, please contact night-coverage at [email or web address], password [facility] 11/10/2013, 4:21 PM  LOS: 1 day

## 2013-11-10 NOTE — Progress Notes (Signed)
ANTIBIOTIC CONSULT NOTE - INITIAL  Pharmacy Consult for Vancomycin  Indication: rule out sepsis  No Known Allergies  Patient Measurements: Height: 5' 9" (175.3 cm) Weight: 152 lb 4.8 oz (69.083 kg) IBW/kg (Calculated) : 70.7  Vital Signs: Temp: 99.4 F (37.4 C) (03/24 0148) Temp src: Oral (03/24 0148) BP: 132/54 mmHg (03/24 0148) Pulse Rate: 63 (03/24 0148) Intake/Output from previous day: 03/23 0701 - 03/24 0700 In: -  Out: 275 [Urine:275] Intake/Output from this shift: Total I/O In: -  Out: 275 [Urine:275]  Labs:  Recent Labs  11/08/13 1300 11/09/13 2310 11/09/13 2318  WBC 2.3* 2.4*  --   HGB 8.1* 7.3* 7.1*  PLT 21* 18*  --   CREATININE 1.10  --  1.40*   Estimated Creatinine Clearance: 46.6 ml/min (by C-G formula based on Cr of 1.4). No results found for this basename: Letta Median, VANCORANDOM, [place], [place], [place], [place], TOBRAPEAK, TOBRARND, AMIKACINPEAK, AMIKACINTROU, AMIKACIN,  in the last 72 hours   Microbiology: Recent Results (from the past 720 hour(s))  TECHNOLOGIST REVIEW     Status: None   Collection Time    10/27/13 10:51 AM      Result Value Ref Range Status   Technologist Review 2% Variant lymphs present( plasmacytoid)    Final  TECHNOLOGIST REVIEW     Status: None   Collection Time    10/30/13  2:39 PM      Result Value Ref Range Status   Technologist Review Few Variant Lymphs, 4% NRBC   Final    Medical History: Past Medical History  Diagnosis Date  . Other and unspecified hyperlipidemia   . Essential hypertension, benign   . BPH (benign prostatic hyperplasia)   . Early stage glaucoma   . Heart murmur     "since my teens" (06/18/2012)  . Cervical stenosis of spine   . GERD (gastroesophageal reflux disease)   . Bone cancer     multiple bone lesion/right humerus  . Spinal stenosis   . Chronic kidney disease     renal insufficiency  . Arthritis   . DDD (degenerative disc disease)     c-5=-c6, and  spondylosis  . Cancer 05/2012    IgA lambda multiple myeloma  . Anorexia 06/25/12    1 month hx   . Diverticulitis   . History of chemotherapy 07/01/12    Velcade,Cytoxin and decadron    Medications:  Prescriptions prior to admission  Medication Sig Dispense Refill  . acetaminophen (TYLENOL) 500 MG tablet Take 1,000 mg by mouth every 6 (six) hours as needed. For pain      . acyclovir (ZOVIRAX) 400 MG tablet Take 400 mg by mouth 2 (two) times daily.      Marland Kitchen allopurinol (ZYLOPRIM) 100 MG tablet Take 100 mg by mouth daily.      Marland Kitchen atenolol-chlorthalidone (TENORETIC) 50-25 MG per tablet Take 1 tablet by mouth daily.      . bimatoprost (LUMIGAN) 0.03 % ophthalmic drops Place 1 drop into both eyes at bedtime.        . brimonidine-timolol (COMBIGAN) 0.2-0.5 % ophthalmic solution Place 1 drop into both eyes 2 (two) times daily.        Marland Kitchen dexamethasone (DECADRON) 4 MG tablet Take 40 mg by mouth as directed. Takes 4 tablets on days 18,15,and 22 of chemo last dose was 11/03/13      . esomeprazole (NEXIUM) 40 MG capsule Take 40 mg by mouth daily at 12 noon.      Marland Kitchen  lenalidomide (REVLIMID) 10 MG capsule Take 10 mg by mouth daily.      . loperamide (IMODIUM) 2 MG capsule Take 2 mg by mouth as needed for diarrhea or loose stools.      . loratadine (CLARITIN) 10 MG tablet Take 10 mg by mouth daily as needed for allergies.      . potassium chloride SA (K-DUR,KLOR-CON) 20 MEQ tablet Take 20 mEq by mouth daily.      . PRESCRIPTION MEDICATION Receiving chemotherapy at [facility] (Dr. Chism) with carfilzomib (Kyprolis) and dexamethasone.  Last dose was given 11/04/13.      . prochlorperazine (COMPAZINE) 10 MG tablet Take 10 mg by mouth every 6 (six) hours as needed for nausea or vomiting.      . senna (SENOKOT) 8.6 MG TABS tablet Take 1 tablet by mouth daily as needed for mild constipation.      . simvastatin (ZOCOR) 20 MG tablet Take 20 mg by mouth at bedtime.       Assessment: 73 yo male with febrile neutropenia for  empiric antibiotics.  Vancomycin 1500 mg IV given in ED at 0100  Goal of Therapy:  Vancomycin trough level 15-20 mcg/ml  Plan:  Vancomycin 1 g IV q24h  Abbott, Gregory Vernon 11/10/2013,3:07 AM   

## 2013-11-10 NOTE — Progress Notes (Signed)
Blood, urine sample and blood transfusion paperwork taken to blood bank

## 2013-11-10 NOTE — ED Notes (Signed)
EDP made aware of critical platelets

## 2013-11-10 NOTE — Progress Notes (Signed)
Utilization review completed.  

## 2013-11-10 NOTE — Progress Notes (Addendum)
Followup progress note I was notified by RN patient developing chills and shortness of breath near the end of transfusion of first unit of packed red blood cells. I instructed RN to discontinue blood transfusion immediately. On my initial assessment he appeared to be in discomfort, having by basilar crackles on exam. I ordered 40 mg of IV Lasix suspected fluid overload. A chest x-ray and ABG were ordered as well. Upon reassessment she does seem improved after receiving IV Lasix. Vitals showed a blood pressure of 158/79 with heart rate of 74 satting 99% on 2 L supplemental oxygen via nasal cannula. I suspect transfusion reaction from packed red blood cells. He had a temperature of 105, discussed case with PCCM, likely having transfusion reaction and will need close monitoring in ICU.  Have ordered Solu-Medrol 125mg  IV x 1 and Benadryl 25 mg IV x 1. 30 min spent in critical care time.

## 2013-11-10 NOTE — Consult Note (Signed)
PULMONARY / CRITICAL CARE MEDICINE   Name: Trevor Michael MRN: [id number] DOB: 05-22-1941    ADMISSION DATE:  11/09/2013 CONSULTATION DATE:  11/10/13  REFERRING MD :  Dr. 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  Recent PRBC tx 2 wks prior to admit.  Felt to have symptomatic anemia on admit & tx 2 units PRBC.  He had ongoing fevers & decision was made to transfuse with premedication.  He developed tachypnea, crackles, fever to 105.9 & resp distress.  Tx to ICU, PCCm consulted.    SIGNIFICANT EVENTS / STUDIES:  3/24 - Admit with fever, diarrhea, syncope thought to be related to symptomatic anemia.  2 units PRBC's for anemia.  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  He is followed by Dr. Juliann Mule and is known to have severe anemia related to chemotherapy.  He received PRBC tx 2 wks prior to admit.  He has been monitored closely for blood counts and was doing well. Prior to admit, he reports weakness and fatigue.  He attempted to get up to the restroom with noted diarrhea after laxatives and had a syncopal episode.  He also had a sleeping pill prior to syncopal episode. Patient was felt to have symptomatic anemia on admit & tx 2 units PRBC.  He had ongoing fevers prior to transfusion & decision was made to premedicate and proceed.  He developed tachypnea, crackles, fever to 105.9 & resp distress.  Tx to ICU, treated with steroids, lasix and oxygen with improvement in symptoms.  PCCM consulted.     PAST  MEDICAL HISTORY :  Past Medical History  Diagnosis Date  . Other and unspecified hyperlipidemia   . Essential hypertension, benign   . BPH (benign prostatic hyperplasia)   . Early stage glaucoma   . Heart murmur     "since my teens" (06/18/2012)  . Cervical stenosis of spine   . GERD (gastroesophageal reflux disease)   . Bone cancer     multiple bone lesion/right humerus  . Spinal stenosis   . Chronic kidney disease     renal insufficiency  . Arthritis   . DDD (degenerative disc disease)     c-5=-c6, and spondylosis  . Cancer 05/2012    IgA lambda multiple myeloma  . Anorexia 06/25/12    1 month hx   . Diverticulitis   . History of chemotherapy 07/01/12    Velcade,Cytoxin and decadron   Past Surgical History  Procedure Laterality Date  . Lipoma excision  07/1986    left shoulder/scapula  . Esophagogastroduodenoscopy  06/20/2012    Procedure: ESOPHAGOGASTRODUODENOSCOPY (EGD);  Surgeon: Beryle Beams, MD;  Location: [facility] ENDOSCOPY;  Service: Endoscopy;  Laterality: N/A;  . Prostate biopsy  2008    benign   Prior to Admission medications   Medication Sig Start Date End Date Taking? Authorizing Provider  acetaminophen (TYLENOL) 500 MG tablet Take 1,000 mg by mouth every 6 (six) hours as needed. For pain  Yes Historical Provider, MD  acyclovir (ZOVIRAX) 400 MG tablet Take 400 mg by mouth 2 (two) times daily.   Yes Historical Provider, MD  allopurinol (ZYLOPRIM) 100 MG tablet Take 100 mg by mouth daily.   Yes Historical Provider, MD  atenolol-chlorthalidone (TENORETIC) 50-25 MG per tablet Take 1 tablet by mouth daily.   Yes Historical Provider, MD  bimatoprost (LUMIGAN) 0.03 % ophthalmic drops Place 1 drop into both eyes at bedtime.     Yes Historical Provider, MD  brimonidine-timolol (COMBIGAN) 0.2-0.5 % ophthalmic solution Place 1 drop into both eyes 2 (two) times daily.     Yes Historical Provider, MD  dexamethasone (DECADRON) 4 MG tablet Take 40 mg by mouth once a week. Takes  4 tablets on days 18,15,and 22 of chemo last dose was 11/03/13   Yes Historical Provider, MD  esomeprazole (NEXIUM) 40 MG capsule Take 40 mg by mouth daily as needed (acid reflux).    Yes Historical Provider, MD  lenalidomide (REVLIMID) 10 MG capsule Take 10 mg by mouth every morning.    Yes Historical Provider, MD  loperamide (IMODIUM) 2 MG capsule Take 2 mg by mouth as needed for diarrhea or loose stools.   Yes Historical Provider, MD  loratadine (CLARITIN) 10 MG tablet Take 10 mg by mouth daily as needed for allergies.   Yes Historical Provider, MD  LORazepam (ATIVAN) 1 MG tablet Take 0.5 mg by mouth every 8 (eight) hours as needed for anxiety or sleep (nausea).   Yes Historical Provider, MD  potassium chloride SA (K-DUR,KLOR-CON) 20 MEQ tablet Take 20 mEq by mouth daily.   Yes Historical Provider, MD  prochlorperazine (COMPAZINE) 10 MG tablet Take 5-10 mg by mouth every 6 (six) hours as needed for nausea or vomiting.    Yes Historical Provider, MD  senna (SENOKOT) 8.6 MG TABS tablet Take 1-2 tablets by mouth daily as needed for mild constipation.    Yes Historical Provider, MD  simvastatin (ZOCOR) 20 MG tablet Take 20 mg by mouth at bedtime.   Yes Historical Provider, MD  PRESCRIPTION MEDICATION Receiving chemotherapy at [facility] (Dr. Juliann Mule) with carfilzomib (Kyprolis) and dexamethasone.  Last dose was given 11/04/13.    Historical Provider, MD   No Known Allergies  FAMILY HISTORY:  Family History  Problem Relation Age of Onset  . Benign prostatic hyperplasia Father   . Pneumonia Father   . Coronary artery disease Father     PTCA  . Heart disease Brother     CABG, CAD,  . Benign prostatic hyperplasia Brother    SOCIAL HISTORY:  reports that he has never smoked. He has never used smokeless tobacco. He reports that he does not drink alcohol or use illicit drugs.  REVIEW OF SYSTEMS:  See HPI for pertinent positives.  All systems reviewed and otherwise negative.   SUBJECTIVE:  "I feel very  weak"  VITAL SIGNS: Temp:  [98.7 F (37.1 C)-105.9 F (41.1 C)] 99.7 F (37.6 C) (03/24 1945) Pulse Rate:  [58-84] 68 (03/24 1920) Resp:  [16-40] 17 (03/24 1920) BP: (113-170)/(50-86) 138/63 mmHg (03/24 1920) SpO2:  [91 %-100 %] 100 % (03/24 1920) FiO2 (%):  [100 %] 100 % (03/24 1920) Weight:  [152 lb 4.8 oz (69.083 kg)] 152 lb 4.8 oz (69.083 kg) (03/24 0148)  HEMODYNAMICS:    VENTILATOR SETTINGS: Vent Mode:  [-]  FiO2 (%):  [100 %] 100 %  INTAKE / OUTPUT: Intake/Output     03/24 0701 - 03/25 0700   Blood 12.5  Total Intake(mL/kg) 12.5 (0.2)   Urine (mL/kg/hr) 2130 (2.4)   Total Output 2130   Net -2117.5         PHYSICAL EXAMINATION: General:  wdwn male, generalized weakness Neuro:  Awakens, appropriate but lethargic, MAE HEENT:  Mm pink/moist, no jvd Cardiovascular:  S1s2, SEM  Lungs:  resp's even/non-labored, lungs bilaterally clear Abdomen:  Round/soft, bsx4 active  Musculoskeletal:  No acute deformities  Skin:  Warm/dry, no edema  LABS:  CBC  Recent Labs Lab 11/08/13 1300 11/09/13 2310 11/09/13 2318 11/10/13 0341  WBC 2.3* 2.4*  --  2.3*  HGB 8.1* 7.3* 7.1* 7.6*  HCT 23.4* 20.2* 21.0* 20.8*  PLT 21* 18*  --  19*   Coag's No results found for this basename: APTT, INR,  in the last 168 hours BMET  Recent Labs Lab 11/08/13 1300 11/09/13 2318 11/10/13 0341  NA 140 129* 133*  K 3.8 3.4* 3.8  CL 100 94* 94*  CO2 25  --  20  BUN _0 CREATININE 1.10 1.40* 1.31  GLUCOSE 100* 118* 105*   Electrolytes  Recent Labs Lab 11/08/13 1300 11/10/13 0341  CALCIUM 8.1* 8.2*  MG  --  2.0  PHOS  --  2.5   Sepsis Markers  Recent Labs Lab 11/09/13 2319  LATICACIDVEN 1.37   ABG  Recent Labs Lab 11/10/13 1813  PHART 7.475*  PCO2ART 21.5*  PO2ART 83.7   Liver Enzymes  Recent Labs Lab 11/09/13 2310 11/10/13 0341  AST 42* 50*  ALT 14 15  ALKPHOS 98 104  BILITOT 2.8* 3.7*  ALBUMIN 3.5 3.7   Cardiac Enzymes  Recent Labs Lab  11/10/13 0341 11/10/13 1213  TROPONINI <0.30 <0.30   Glucose No results found for this basename: GLUCAP,  in the last 168 hours  Imaging X-ray Chest Pa And Lateral   11/10/2013   CLINICAL DATA:  Fever.  EXAM: CHEST  2 VIEW  COMPARISON:  11/09/2013  FINDINGS: The heart size and mediastinal contours are within normal limits. Both lungs are clear. The visualized skeletal structures are unremarkable.  IMPRESSION: No active cardiopulmonary disease.   Electronically Signed   By: Earle Gell M.D.   On: 11/10/2013 13:55   Dg Chest Port 1 View  11/10/2013   CLINICAL DATA:  Shortness of breath.  EXAM: PORTABLE CHEST - 1 VIEW  COMPARISON:  DG CHEST 2 VIEW dated 11/10/2013; DG CHEST 1V PORT dated 11/09/2013  FINDINGS: Mediastinum and hilar structures are normal. Cardiomegaly. Pulmonary vascularity is normal. No pleural effusion or pneumothorax. Multiple lucencies are noted throughout the thoracic cage. This is consistent with myeloma and/or metastatic disease.  IMPRESSION: 1. No acute cardiopulmonary disease.  Stable cardiomegaly. 2. Innumerable lucencies noted throughout the thoracic cage consistent with myeloma and/or metastatic disease.   Electronically Signed   By: Marcello Moores  Register   On: 11/10/2013 18:41   Dg Chest Port 1 View  11/09/2013   CLINICAL DATA:  Altered mental status, history of multiple myeloma.  EXAM: PORTABLE CHEST - 1 VIEW  COMPARISON:  DG BONE SURVEY MET dated 07/21/2013  FINDINGS: The cardiac silhouette appears mildly enlarged, slightly more conspicuous than prior examination. Mild interstitial prominence without pleural effusions or focal consolidation. 3 mm nodule again seen in right lung base which may reflect granuloma. No pneumothorax.  Patient is osteopenic. Multiple tiny lytic lesions appear slightly worse though, not tailored for evaluation.  IMPRESSION: Mild cardiomegaly and mild interstitial prominence suggest pulmonary edema without focal consolidation.   Electronically Signed  By:  Elon Alas   On: 11/09/2013 23:26    ASSESSMENT / PLAN:  PULMONARY A: Acute Respiratory Distress - resolved. Post lasix. P:   -oxygen to support sats >93% -pulmonary hygiene -f/u cxr  CARDIOVASCULAR A:  At Risk Shock Hx HTN Hx Murmur HLD P:  -hold atenolol-chlorthalidone -hold zocor -monitor BP trends -cardiac enzymes neg x 2 -monitor in ICU overnight  RENAL A:   CKD with AKI - baseline ~ 1 P:   -monitor BMP  GASTROINTESTINAL A:   Hx Diverticulitis Diarrhea - prior to admit after laxative use P:   -monitor stool output, if increased or diarrhea continues, sample stool culture -PPI -PRN zofran   HEMATOLOGIC A:   Multiple Myeloma  Anemia  Potential Blood Transfusion Reaction - not clear this a defined transfusion related reaction as he was febrile prior to tx with symptoms.   Thrombocytopenia P:  -continue steroids -transfusions per Dr. Juliann Mule  -monitor platelets, bleeding precautions -continue benadryl, tylenol  -SCD's for DVT prophylaxis   INFECTIOUS A:   Rule Out UTI  P:   -assess UC  -consider ID consult  -follow blood cultures -empiric abx as above -continue tamiflu, acyclovir   ENDOCRINE A:   No acute issues  P:   -monitor glucose on BMP  NEUROLOGIC A:   Acute Encephalopathy - in setting of fever P:   -monitor / supportive care  Noe Gens, NP-C [facility] Pgr: [phone] or [phone]   PCCM ATTENDING: I have interviewed and examined the patient and reviewed the database. I have formulated the assessment and plan as reflected in the note above with amendments made by me.   Consider ID consult If he remains stable and is ready to transfer out of ICU in AM, will leave on [facility] service and PCCM will remain as consultants.   Merton Border, MD;  PCCM service; Mobile [phone]     11/10/2013, 8:07 PM

## 2013-11-10 NOTE — Progress Notes (Signed)
  Echocardiogram 2D Echocardiogram has been performed.  Trevor Michael 11/10/2013, 4:13 PM

## 2013-11-10 NOTE — Progress Notes (Signed)
Order for foley cath received due to concern for bladder retention and bloody urine that patient has had today. Pt complaining of lower left quadrant discomfort and inability to feel like he is emptying his bladder and it seems to be getting more difficult for him to void. 15 French catheter placed under sterile technique and pericare done prior to insertion of catheter. Hubert Azure, RN supervised catheter placement and immediate return of 1000 cc of bloody urine without any noticeable clots emptied. Will closely monitor. Pt denies pain in lower quadrant after insertion of catheter.

## 2013-11-10 NOTE — Progress Notes (Signed)
I came into the room for hourly check with blood transfusion to find the patient tachypniec with a resp rate of 36, short of breath with crackles in the lower bases. He was afebrile but in clear distress. Pt alert and oriented but much more sluggish than he has been. I stopped the blood transfusion immediately. Dr. Coralyn Pear happened to be on the floor and he immediately came to room to assist. Orders for IV lasix received and given and to work blood up as a transfusion reaction. Vital signs done frequently (see doc flowsheet and blood transfusion flowsheet). O2 sats 90% on ra (pt has been in the upper 90s). O2 at 2l Weed placed on patient and he recovered with sats back into the upper 90s. Orders for ABG and PCXR received and done promptly as ordered. I called rapid response Helle, RN as it didn't seem that the patient was improving. Pt became more restless as time went on and more lethargic. 100% NRB placed for support. Pt rapidly felt warmer with a rectal temp of 105.9. Ice bags placed on patient and orders received for ICU. Pt transported to 2s with 2 rapid response RNs on monitor and 02. Dr. Coralyn Pear  and family at bedside and present entire time.

## 2013-11-10 NOTE — Progress Notes (Addendum)
Trevor Michael   DOB:1941-01-26   WC#:[id number]   MPN#:[id number]  Subjective: Nurse reports last temp 100.5. He awaits blood transfusion. Patient notes worsening urinary discomfort with hesitation.  Chart reviewed. Sister and brother-in-law at bed side.     Multiple myeloma   05/21/2012 Initial Diagnosis Multiple myeloma, ISS stage III.   06/19/2012 Imaging Lytic lesions in the calvaria and right hemipelvis and a possible lesion in the right humerus.    06/20/2012 Imaging CT scan of the right humerus showed a 2.3 cm lucent lesion with probable mild endosteal thinning anteriorly near the bicipital groove.  several small lytic lesions in humeral head, scaula, glenoid, and proximal radius.     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 Plasma cells are lambda ligh chain restricted.    06/24/2012 Pathology Cytogenetics revealed the presence of normal male chormosomes with no observable clonal abnormalities.  Single cell with additional chromosome material on 3q, loss of 13 .  DNA, gain of chromosome 11.    07/01/2012 - 01/02/2013 Chemotherapy Velcade, cytoan and decadron. doses were increased on 11/06/2012.  Neulasta was started on 08/16/2012 because of leukopenia.    07/10/2012 - 07/28/2012 Radiation Therapy He received XRT to the right humerus 25 Gy in 10 fractions.    12/04/2012 Bone Marrow Biopsy Hypercllular for age with trilineage hematopoiesis in addition to increased number of atypical plasma cells estimated at 24% of all cells in the aspirate. Lamda light chain restriction. rare circulating plasma cells.   12/04/2012 Pathology Cytogenetics revealed the presence of 2 clonal cell lines. first line with chromosal normal (25%).  2nd line was abnormal, missing a Y chromosome. 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 FISH showed gain of 11.    01/28/2013 Bone Marrow  Transplant Autologous stem cell transplant.  S/p melphalan 140 mg /m2 on 01/27/2013.    06/27/2013 Tumor Marker 24-hour urine yielded 17.1 gram of protein of which 16.9 grams was free lambda light chains.  Beta 2 microglobulin was 5.8. Albumin 3.2. IgG 6,320.    07/07/2013 - 09/01/2013 Chemotherapy Maintenace velcade started.   09/21/2013 Progression Counts decreasing; WBC followed by Plts. Referred to [facility] for recommendations for next therapy.    10/12/2013 Bone Marrow Biopsy Done at [facility]  Per care everywhere, PLASMA CELLS COMPRISE 80% OF A PACKED BONE MARROW.    10/22/2013 Tumor Marker Labs done at [facility].  spep m-spike 0.08, FLC lambda 379.00, ratio 0. 24 hour urine, m-spike 16632, IFE positive for monoclonal lambda.   10/27/2013 -  Chemotherapy Started salvage chemotherapy with carlfozomib plus dexamethasone plus revlimid.     Objective:  Filed Vitals:   11/10/13 1302  BP:   Pulse:   Temp: 100.5 F (38.1 C)  Resp:     Body mass index is 22.48 kg/(m^2).  Intake/Output Summary (Last 24 hours) at 11/10/13 1333 Last data filed at 11/10/13 0500  Gross per 24 hour  Intake      0 ml  Output    475 ml  Net   -475 ml     Sclerae icteric, non-toxic appearing, AAOx3  Oropharynx clear  No peripheral adenopathy  Lungs clear -- no rales or rhonchi  Heart regular rate and rhythm  Abdomen benign  MSK no focal spinal tenderness, no peripheral edema  Neuro nonfocal, Cranial Nerves II-XII grossly intact.  Foley draining dark-reddish brown urine  Labs:  Lab Results  Component Value Date   WBC 2.3* 11/10/2013   HGB 7.6* 11/10/2013   HCT 20.8* 11/10/2013   MCV 94.1 11/10/2013   PLT 19* 11/10/2013   NEUTROABS 1.4* 0/25/4270   Basic Metabolic Panel:  Recent Labs Lab 11/08/13 1300 11/09/13 2318 11/10/13 0341  NA 140 129* 133*  K 3.8 3.4* 3.8  CL 100 94* 94*  CO2 25  --  20  GLUCOSE 100* 118* 105*  BUN 15 19 20   CREATININE 1.10 1.40* 1.31  CALCIUM 8.1*  --  8.2*  MG  --   --   2.0  PHOS  --   --  2.5   GFR Estimated Creatinine Clearance: 49.8 ml/min (by C-G formula based on Cr of 1.31). Liver Function Tests:  Recent Labs Lab 11/09/13 2310 11/10/13 0341  AST 42* 50*  ALT 14 15  ALKPHOS 98 104  BILITOT 2.8* 3.7*  PROT 5.9* 6.3  ALBUMIN 3.5 3.7    CBC:  Recent Labs Lab 11/06/13 1027 11/08/13 1300 11/09/13 2310 11/09/13 2318 11/10/13 0341  WBC 2.6* 2.3* 2.4*  --  2.3*  NEUTROABS 1.2* 1.4* 1.4*  --   --   HGB 7.9* 8.1* 7.3* 7.1* 7.6*  HCT 23.3* 23.4* 20.2* 21.0* 20.8*  MCV 91.4 92.9 92.2  --  94.1  PLT 22* 21* 18*  --  19*   Results for MARCHELLO, ROTHGEB (MRN [id number]) as of 11/10/2013 13:40  Ref. Range 11/10/2013 03:41  Haptoglobin Latest Range: 45-215 mg/dL <25 (L)  Results for TRAVON, CROCHET (MRN [id number]) as of 11/10/2013 13:40  Ref. Range 11/10/2013 03:41  Total Bilirubin Latest Range: 0.3-1.2 mg/dL 3.7 (H)   Results for ANDREY, MCCASKILL (MRN [id number]) as of 11/10/2013 13:40  Ref. Range 10/30/2013 14:39  LDH Latest Range: 125-245 U/L 415 (H)   Results for ORBIN, MAYEUX (MRN [id number]) as of 11/10/2013 13:40  Ref. Range 11/10/2013 01:15  DAT, IgG No range found NEG   Results for MORGON, PAMER (MRN [id number]) as of 11/10/2013 13:40  Ref. Range 11/09/2013 23:10  Indirect Bilirubin Latest Range: 0.3-0.9 mg/dL 2.5 (H)   Cardiac Enzymes:  Recent Labs Lab 11/09/13 2310 11/10/13 0341 11/10/13 1213  CKTOTAL 75  --   --   TROPONINI  --  <0.30 <0.30   Lipid Profile No results found for this basename: CHOL, HDL, LDLCALC, TRIG, CHOLHDL, LDLDIRECT,  in the last 72 hours Thyroid function studies  Recent Labs  11/10/13 0341  TSH 0.618    Studies:  Dg Chest Port 1 View  11/09/2013   CLINICAL DATA:  Altered mental status, history of multiple myeloma.  EXAM: PORTABLE CHEST - 1 VIEW  COMPARISON:  DG BONE SURVEY MET dated 07/21/2013  FINDINGS: The cardiac silhouette appears mildly enlarged, slightly more conspicuous than prior  examination. Mild interstitial prominence without pleural effusions or focal consolidation. 3 mm nodule again seen in right lung base which may reflect granuloma. No pneumothorax.  Patient is osteopenic. Multiple tiny lytic lesions appear slightly worse though, not tailored for evaluation.  IMPRESSION: Mild cardiomegaly and mild interstitial prominence suggest pulmonary edema without focal consolidation.   Electronically Signed   By: Elon Alas   On: 11/09/2013 23:26    Assessment: 72 y.o.  1. 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 Unclear etiology, possible medication versus infection (#2) 2. Febrile illness with syncope 3. Pancytopenia secondary to chemo/MM 4. Urinary flow  changes 5. MM S/p SCT on salvage chemotherapy w Calfizomib plus dexamethasone 6. Pulmonary edema based on above XRay  Recommendations:   1. I added a peripheral smear and will review and addendum. TTP should also be considered but he is having  high fevers, more suggestive of infectious etiology.   2. Agree with supportive care with 2 units of packed RBC; one unit of plts.  3. Likely will require diuretics between infusion if pressure tolerates.  4. If persistance febrile despite above antibiotics, please consult ID. Await cultures. Continue broad spectrum-antibiotics. 5. Check free hgb in plasma and urine plus hemosiderin in urine sediment 6. HOLD Chemotherapy in the setting of above.  7. CBC, Peripheral blood smear, LDH, haptoglobin daily  We will follow Mr. Degrace with you.    Jkayla Spiewak, MD 11/10/2013  1:33 PM Addendum: Peripheral Smear was reviewed.  It revealed polychromasia, few schistocytes, few elliptocytes.

## 2013-11-10 NOTE — Progress Notes (Signed)
Pt came back from Xray and I checked vital signs prior to getting blood since blood bank called to tell me it was ready. Temp 101.1, 02 sats 99%ra, 18, 154/86. I gave tylenol as ordered with PRN medications. Dr. Coralyn Pear paged to be made aware. Will continue to monitor and reassess opportunity to transfuse blood

## 2013-11-10 NOTE — Progress Notes (Signed)
Notified Dr. Coralyn Pear when he was on the phone of patients urinary retention

## 2013-11-10 NOTE — Progress Notes (Signed)
Pts temp down to 99.5. Dr. Coralyn Pear and Cr. Chism notified and stated it was ok to go ahead an proceed with blood transfusion. Pt received 325 of tylenol and 25 benedryl pre meds. See documentation of blood in blood transfusion flowsheet

## 2013-11-11 ENCOUNTER — Ambulatory Visit: Payer: Medicare Other

## 2013-11-11 DIAGNOSIS — R748 Abnormal levels of other serum enzymes: Secondary | ICD-10-CM

## 2013-11-11 DIAGNOSIS — D839 Common variable immunodeficiency, unspecified: Secondary | ICD-10-CM

## 2013-11-11 DIAGNOSIS — D594 Other nonautoimmune hemolytic anemias: Secondary | ICD-10-CM

## 2013-11-11 DIAGNOSIS — T8092XA Unspecified transfusion reaction, initial encounter: Secondary | ICD-10-CM

## 2013-11-11 LAB — CBC WITH DIFFERENTIAL/PLATELET
BASOS PCT: 1 % (ref 0–1)
Basophils Absolute: 0 10*3/uL (ref 0.0–0.1)
EOS ABS: 0 10*3/uL (ref 0.0–0.7)
Eosinophils Relative: 0 % (ref 0–5)
HCT: 20.4 % — ABNORMAL LOW (ref 39.0–52.0)
HEMOGLOBIN: 7.2 g/dL — AB (ref 13.0–17.0)
LYMPHS PCT: 24 % (ref 12–46)
Lymphs Abs: 0.6 10*3/uL — ABNORMAL LOW (ref 0.7–4.0)
MCH: 33.6 pg (ref 26.0–34.0)
MCHC: 35.3 g/dL (ref 30.0–36.0)
MCV: 95.3 fL (ref 78.0–100.0)
MONOS PCT: 16 % — AB (ref 3–12)
Monocytes Absolute: 0.4 10*3/uL (ref 0.1–1.0)
Neutro Abs: 1.6 10*3/uL — ABNORMAL LOW (ref 1.7–7.7)
Neutrophils Relative %: 59 % (ref 43–77)
Platelets: 20 10*3/uL — CL (ref 150–400)
RBC: 2.14 MIL/uL — ABNORMAL LOW (ref 4.22–5.81)
RDW: 20.5 % — ABNORMAL HIGH (ref 11.5–15.5)
WBC: 2.6 10*3/uL — ABNORMAL LOW (ref 4.0–10.5)

## 2013-11-11 LAB — COMPREHENSIVE METABOLIC PANEL WITH GFR
ALT: 20 U/L (ref 0–53)
AST: 110 U/L — ABNORMAL HIGH (ref 0–37)
Albumin: 3.2 g/dL — ABNORMAL LOW (ref 3.5–5.2)
Alkaline Phosphatase: 84 U/L (ref 39–117)
BUN: 36 mg/dL — ABNORMAL HIGH (ref 6–23)
CO2: 19 meq/L (ref 19–32)
Calcium: 7.7 mg/dL — ABNORMAL LOW (ref 8.4–10.5)
Chloride: 93 meq/L — ABNORMAL LOW (ref 96–112)
Creatinine, Ser: 2.38 mg/dL — ABNORMAL HIGH (ref 0.50–1.35)
GFR calc Af Amer: 30 mL/min — ABNORMAL LOW
GFR calc non Af Amer: 26 mL/min — ABNORMAL LOW
Glucose, Bld: 158 mg/dL — ABNORMAL HIGH (ref 70–99)
Potassium: 3.6 meq/L — ABNORMAL LOW (ref 3.7–5.3)
Sodium: 133 meq/L — ABNORMAL LOW (ref 137–147)
Total Bilirubin: 4.3 mg/dL — ABNORMAL HIGH (ref 0.3–1.2)
Total Protein: 5.8 g/dL — ABNORMAL LOW (ref 6.0–8.3)

## 2013-11-11 LAB — HAPTOGLOBIN: Haptoglobin: <25 mg/dL — ABNORMAL LOW (ref 45–215)

## 2013-11-11 LAB — TROPONIN I
Troponin I: 0.55 ng/mL (ref <0.30)
Troponin I: 0.34 ng/mL (ref <0.30)

## 2013-11-11 LAB — LACTATE DEHYDROGENASE: LDH: 1933 U/L — ABNORMAL HIGH (ref 94–250)

## 2013-11-11 MED ORDER — VANCOMYCIN HCL IN DEXTROSE 750-5 MG/150ML-% IV SOLN
750.0000 mg | INTRAVENOUS | Status: DC
  Filled 2013-11-11: qty 150

## 2013-11-11 MED ORDER — SODIUM CHLORIDE 0.9 % IV SOLN
INTRAVENOUS | Status: DC
  Administered 2013-11-11 (×2): via INTRAVENOUS
  Administered 2013-11-12 – 2013-11-13 (×2): 100 mL/h via INTRAVENOUS

## 2013-11-11 MED ORDER — OSELTAMIVIR PHOSPHATE 30 MG PO CAPS
30.0000 mg | ORAL_CAPSULE | Freq: Every day | ORAL | Status: DC
Start: 2013-11-12 — End: 2013-11-11

## 2013-11-11 MED ORDER — SODIUM CHLORIDE 0.9 % IV BOLUS (SEPSIS)
500.0000 mL | Freq: Once | INTRAVENOUS | Status: AC
  Administered 2013-11-11: 500 mL via INTRAVENOUS

## 2013-11-11 MED ORDER — IMMUNE GLOBULIN (HUMAN) 10 GM/100ML IV SOLN
400.0000 mg/kg | INTRAVENOUS | Status: AC
  Administered 2013-11-11 – 2013-11-13 (×3): 25 g via INTRAVENOUS
  Filled 2013-11-11 (×4): qty 250

## 2013-11-11 NOTE — Consult Note (Signed)
PULMONARY / CRITICAL CARE MEDICINE   Name: Trevor Michael MRN: [id number] DOB: 08/05/1941    ADMISSION DATE:  11/09/2013 CONSULTATION DATE:  11/10/2013  REFERRING MD :  [facility] PRIMARY SERVICE: [facility]  CHIEF COMPLAINT:  Fever in setting of MM   BRIEF PATIENT DESCRIPTION: 73 yo with MM s/p stem cell transplant (6/14) on chemotherapy who presented on 3/24 with fever of 104, syncope & diarrhea.  Felt to have symptomatic anemia on admission and transfused 2 units PRBC.  Developed tachypnea, crackles, fever to 105.9 and respiratory distress during transfusion.  Transferred to ICU.  SIGNIFICANT EVENTS / STUDIES:  3/24   Suspected transfusion reaction, transferred to ICU  LINES / TUBES:  CULTURES: 3/23  Blood >>> 3/24  Urine >>>  ANTIBIOTICS: Vancomycin 3/24 >>> Zosyn 3/24 >>> Acyclovir 3/24 >>> Tamiflu 3/24 >>>  INTERVAL HISTORY:  Hemodynamically stable. No respiratory issues.  VITAL SIGNS: Temp:  [94.3 F (34.6 C)-105.9 F (41.1 C)] 97.7 F (36.5 C) (03/25 0752) Pulse Rate:  [46-77] 46 (03/25 0700) Resp:  [7-40] 13 (03/25 0700) BP: (77-170)/(34-86) 108/48 mmHg (03/25 0700) SpO2:  [91 %-100 %] 99 % (03/25 0700) FiO2 (%):  [100 %] 100 % (03/24 1920) Weight:  [67.4 kg (148 lb 9.4 oz)] 67.4 kg (148 lb 9.4 oz) (03/24 2000)  HEMODYNAMICS:   VENTILATOR SETTINGS: Vent Mode:  [-]  FiO2 (%):  [100 %] 100 %  INTAKE / OUTPUT: Intake/Output     03/24 0701 - 03/25 0700 03/25 0701 - 03/26 0700   Blood 12.5    IV Piggyback 400    Total Intake(mL/kg) 412.5 (6.1)    Urine (mL/kg/hr) 3155 (2)    Total Output 3155     Net -2742.5            PHYSICAL EXAMINATION: General:  No respiratory diistress Neuro:  Awake, alert HEENT:  No JVD, moist membranes Cardiovascular:  Regular, no murmurs Lungs:  Bilateral air entry, no added sounds Abdomen:  Soft, non tender Musculoskeletal:  Moves all extremities, no edema Skin:  No rash  LABS:  CBC  Recent Labs Lab 11/09/13 2310  11/09/13 2318 11/10/13 0341 11/11/13 0221  WBC 2.4*  --  2.3* 2.6*  HGB 7.3* 7.1* 7.6* 7.2*  HCT 20.2* 21.0* 20.8* 20.4*  PLT 18*  --  19* 20*   Coag's No results found for this basename: APTT, INR,  in the last 168 hours  BMET  Recent Labs Lab 11/08/13 1300 11/09/13 2318 11/10/13 0341 11/11/13 0221  NA 140 129* 133* 133*  K 3.8 3.4* 3.8 3.6*  CL 100 94* 94* 93*  CO2 25  --  20 19  BUN 15 19 20  36*  CREATININE 1.10 1.40* 1.31 2.38*  GLUCOSE 100* 118* 105* 158*   Electrolytes  Recent Labs Lab 11/08/13 1300 11/10/13 0341 11/11/13 0221  CALCIUM 8.1* 8.2* 7.7*  MG  --  2.0  --   PHOS  --  2.5  --    Sepsis Markers  Recent Labs Lab 11/09/13 2319  LATICACIDVEN 1.37   ABG  Recent Labs Lab 11/10/13 1813  PHART 7.475*  PCO2ART 21.5*  PO2ART 83.7   Liver Enzymes  Recent Labs Lab 11/09/13 2310 11/10/13 0341 11/11/13 0221  AST 42* 50* 110*  ALT 14 15 20   ALKPHOS 98 104 84  BILITOT 2.8* 3.7* 4.3*  ALBUMIN 3.5 3.7 3.2*   Cardiac Enzymes  Recent Labs Lab 11/10/13 1213 11/10/13 2013 11/10/13 2240  TROPONINI <0.30 0.40* 0.73*  Glucose No results found for this basename: GLUCAP,  in the last 168 hours  IMAGING:   X-ray Chest Pa And Lateral   11/10/2013   CLINICAL DATA:  Fever.  EXAM: CHEST  2 VIEW  COMPARISON:  11/09/2013  FINDINGS: The heart size and mediastinal contours are within normal limits. Both lungs are clear. The visualized skeletal structures are unremarkable.  IMPRESSION: No active cardiopulmonary disease.   Electronically Signed   By: Earle Gell M.D.   On: 11/10/2013 13:55   Dg Chest Port 1 View  11/10/2013   CLINICAL DATA:  Shortness of breath.  EXAM: PORTABLE CHEST - 1 VIEW  COMPARISON:  DG CHEST 2 VIEW dated 11/10/2013; DG CHEST 1V PORT dated 11/09/2013  FINDINGS: Mediastinum and hilar structures are normal. Cardiomegaly. Pulmonary vascularity is normal. No pleural effusion or pneumothorax. Multiple lucencies are noted throughout the  thoracic cage. This is consistent with myeloma and/or metastatic disease.  IMPRESSION: 1. No acute cardiopulmonary disease.  Stable cardiomegaly. 2. Innumerable lucencies noted throughout the thoracic cage consistent with myeloma and/or metastatic disease.   Electronically Signed   By: Marcello Moores  Register   On: 11/10/2013 18:41   Dg Chest Port 1 View  11/09/2013   CLINICAL DATA:  Altered mental status, history of multiple myeloma.  EXAM: PORTABLE CHEST - 1 VIEW  COMPARISON:  DG BONE SURVEY MET dated 07/21/2013  FINDINGS: The cardiac silhouette appears mildly enlarged, slightly more conspicuous than prior examination. Mild interstitial prominence without pleural effusions or focal consolidation. 3 mm nodule again seen in right lung base which may reflect granuloma. No pneumothorax.  Patient is osteopenic. Multiple tiny lytic lesions appear slightly worse though, not tailored for evaluation.  IMPRESSION: Mild cardiomegaly and mild interstitial prominence suggest pulmonary edema without focal consolidation.   Electronically Signed   By: Elon Alas   On: 11/09/2013 23:26   ASSESSMENT / PLAN:  PULMONARY A: Pulmonary edema / transfusion reaction, resolved with Lasix P:   Supplemental oxygen PRN  CARDIOVASCULAR A:  Demand ischemia Hemodynamically stable P:  ASA Hold Zocor as elevated LFTs Hold Tenoretic as hypotension overnight  RENAL A:   AKI Hematuria, likely secondary to hemolysis P:   Trend BMP Start NS@100   GASTROINTESTINAL A:   Diarrhea, prior to admit secondary to laxative use GERD Nutrition Hx Diverticulitis P:   Protonix Diet   HEMATOLOGIC A:   Multiple myeloma  Pancytopenia Suspected transfusion reaction Hemolysis Immunocompromised VTE Px P:  Would prefer Hb>8 given active ischemia Per Dr. Juliann Mule  Solu-Medrol SCDs  INFECTIOUS A:   FUO P:   Abx / cx as above Consult ID  ENDOCRINE A:   No acute issues  P:   No intervention required  NEUROLOGIC A:    Acute encephalopathy in setting of fever, resolved P:   No intervention required  Stable for SDU.  PCCM will sign off.  TRH to assume care 3/26.  I have personally obtained history, examined patient, evaluated and interpreted laboratory and imaging results, reviewed medical records, formulated assessment / plan and placed orders.  Doree Fudge, MD Pulmonary and [place] Pager: [phone]  11/11/2013, 9:47 AM

## 2013-11-11 NOTE — Progress Notes (Signed)
Pt came here from 2s icu just now. Pt is alert and oriented x 4. Pt is sitting in chair with call bell and phone at reach. Pt given a CHG bath. Pt is on neutropenic precautions. Placed sign on door. Family here to visit pt. Called central telemetry and notified that pt was here. Pt resting.

## 2013-11-11 NOTE — Progress Notes (Signed)
Medical oncology  Chart Reviewed.  Recommendations: 1. AVOID all transfusions in the setting of his non-immune hemolysis if H/D stable. 2. Hemolysis likely secondary to sepsis or infectious etiology.  Please consult ID.  Consider starting antifungal therapies.   3. Supportive care.    We will see Mr. Hipp later today.

## 2013-11-11 NOTE — H&P (Signed)
Please see full consult note.  

## 2013-11-11 NOTE — Progress Notes (Signed)
Trevor Michael   DOB:[birth year]   KC#:[id number]   VCB#:[id number]  Subjective: Patient states that he had orange juice and subsequently became sick at the stomach.  He had emesis. He denies chest pain.  No acute overnight events noted.     Objective:  Filed Vitals:   11/11/13 1600  BP:   Pulse:   Temp: 97.6 F (36.4 C)  Resp:     Body mass index is 21.93 kg/(m^2).  Intake/Output Summary (Last 24 hours) at 11/11/13 1712 Last data filed at 11/11/13 1230  Gross per 24 hour  Intake   1365 ml  Output   2225 ml  Net   -860 ml     Sclerae non icteric, non-toxic appearing, AAOx3  Oropharynx clear  Lungs clear -- no rales or rhonchi  Heart bradycardic soft murmur  Abdomen benign  MSK no no peripheral edema  Neuro nonfocal              Foley draining dark-reddish brown urine  Labs:  Lab Results  Component Value Date   WBC 2.6* 11/11/2013   HGB 7.2* 11/11/2013   HCT 20.4* 11/11/2013   MCV 95.3 11/11/2013   PLT 20* 11/11/2013   NEUTROABS 1.6* 3/84/6659   Basic Metabolic Panel:  Recent Labs Lab 11/08/13 1300 11/09/13 2318 11/10/13 0341 11/11/13 0221  NA 140 129* 133* 133*  K 3.8 3.4* 3.8 3.6*  CL 100 94* 94* 93*  CO2 25  --  20 19  GLUCOSE 100* 118* 105* 158*  BUN 15 19 20  36*  CREATININE 1.10 1.40* 1.31 2.38*  CALCIUM 8.1*  --  8.2* 7.7*  MG  --   --  2.0  --   PHOS  --   --  2.5  --    GFR Estimated Creatinine Clearance: 26.7 ml/min (by C-G formula based on Cr of 2.38). Liver Function Tests:  Recent Labs Lab 11/09/13 2310 11/10/13 0341 11/11/13 0221  AST 42* 50* 110*  ALT 14 15 20   ALKPHOS 98 104 84  BILITOT 2.8* 3.7* 4.3*  PROT 5.9* 6.3 5.8*  ALBUMIN 3.5 3.7 3.2*    CBC:  Recent Labs Lab 11/06/13 1027 11/08/13 1300 11/09/13 2310 11/09/13 2318 11/10/13 0341 11/11/13 0221  WBC 2.6* 2.3* 2.4*  --  2.3* 2.6*  NEUTROABS 1.2* 1.4* 1.4*  --   --  1.6*  HGB 7.9* 8.1* 7.3* 7.1* 7.6* 7.2*  HCT 23.3* 23.4* 20.2* 21.0* 20.8* 20.4*  MCV 91.4 92.9 92.2  --   94.1 95.3  PLT 22* 21* 18*  --  19* 20*   Results for CHARLIES, RAYBURN (MRN [id number]) as of 11/11/2013 17:14  Ref. Range 11/11/2013 02:21  Haptoglobin Latest Range: 45-215 mg/dL <25 (L)    Results for JAIDON, ELLERY (MRN [id number]) as of 11/11/2013 17:14  Ref. Range 11/11/2013 02:21  Total Bilirubin Latest Range: 0.3-1.2 mg/dL 4.3 (H)   Results for OZZIE, KNOBEL (MRN [id number]) as of 11/11/2013 17:14  Ref. Range 11/11/2013 02:21  LDH Latest Range: 125-245 U/L 1933 (H)    Results for BYRANT, VALENT (MRN [id number]) as of 11/10/2013 13:40  Ref. Range 11/10/2013 01:15  DAT, IgG No range found NEG   Results for KRISTINE, TILEY (MRN [id number]) as of 11/10/2013 13:40  Ref. Range 11/09/2013 23:10  Indirect Bilirubin Latest Range: 0.3-0.9 mg/dL 2.5 (H)   Cardiac Enzymes:  Recent Labs Lab 11/09/13 2310 11/10/13 0341 11/10/13 1213 11/10/13 2013 11/10/13 2240 11/11/13 1003  CKTOTAL 75  --   --   --   --   --  TROPONINI  --  <0.30 <0.30 0.40* 0.73* 0.55*   Lipid Profile No results found for this basename: CHOL, HDL, LDLCALC, TRIG, CHOLHDL, LDLDIRECT,  in the last 72 hours Thyroid function studies  Recent Labs  11/10/13 0341  TSH 0.618    Studies:  X-ray Chest Pa And Lateral   11/10/2013   CLINICAL DATA:  Fever.  EXAM: CHEST  2 VIEW  COMPARISON:  11/09/2013  FINDINGS: The heart size and mediastinal contours are within normal limits. Both lungs are clear. The visualized skeletal structures are unremarkable.  IMPRESSION: No active cardiopulmonary disease.   Electronically Signed   By: Earle Gell M.D.   On: 11/10/2013 13:55   Dg Chest Port 1 View  11/10/2013   CLINICAL DATA:  Shortness of breath.  EXAM: PORTABLE CHEST - 1 VIEW  COMPARISON:  DG CHEST 2 VIEW dated 11/10/2013; DG CHEST 1V PORT dated 11/09/2013  FINDINGS: Mediastinum and hilar structures are normal. Cardiomegaly. Pulmonary vascularity is normal. No pleural effusion or pneumothorax. Multiple lucencies are noted  throughout the thoracic cage. This is consistent with myeloma and/or metastatic disease.  IMPRESSION: 1. No acute cardiopulmonary disease.  Stable cardiomegaly. 2. Innumerable lucencies noted throughout the thoracic cage consistent with myeloma and/or metastatic disease.   Electronically Signed   By: Marcello Moores  Register   On: 11/10/2013 18:41   Dg Chest Port 1 View  11/09/2013   CLINICAL DATA:  Altered mental status, history of multiple myeloma.  EXAM: PORTABLE CHEST - 1 VIEW  COMPARISON:  DG BONE SURVEY MET dated 07/21/2013  FINDINGS: The cardiac silhouette appears mildly enlarged, slightly more conspicuous than prior examination. Mild interstitial prominence without pleural effusions or focal consolidation. 3 mm nodule again seen in right lung base which may reflect granuloma. No pneumothorax.  Patient is osteopenic. Multiple tiny lytic lesions appear slightly worse though, not tailored for evaluation.  IMPRESSION: Mild cardiomegaly and mild interstitial prominence suggest pulmonary edema without focal consolidation.   Electronically Signed   By: Elon Alas   On: 11/09/2013 23:26    Assessment: 73 y.o.  1. 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 Questionable transfusion reaction 3. Febrile illness with syncope 4. Pancytopenia secondary to chemo/MM 5. Urinary flow changes 6. MM S/p SCT on salvage chemotherapy w Calfizomib plus dexamethasone 7. Pulmonary edema based on above X-Ray 8. 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 Elevated troponin likely demand ischemia secondary #2   Recommendations:   1. Please start IVIG 400 mg/kg per day x 3 days secondary to #1 and #8  2. HOLD transfusion of blood products unless hemodynamically unstable.  3. HOLD chemotherapy in the setting of above.  4. CBC, Peripheral blood smear, LDH, haptoglobin daily 5. Please continue steroids for a total of 3 days.   We will follow Mr. Seib with you.    Azile Minardi, MD 11/11/2013  5:12  PM Addendum: Peripheral Smear was reviewed.  It revealed polychromasia, few schistocytes, few elliptocytes.

## 2013-11-11 NOTE — Consult Note (Addendum)
[facility] for Infectious Disease     Reason for Consult: fever    Referring Physician: Dr. Wynelle Cleveland  Active Problems:   HYPERTENSION, MILD   Multiple myeloma   Fever   Syncope   Dehydration   Acute respiratory failure   . acyclovir  400 mg Oral BID  . allopurinol  100 mg Oral Daily  . aspirin EC  325 mg Oral Daily  . brimonidine  1 drop Both Eyes BID  . docusate sodium  100 mg Oral BID  . latanoprost  1 drop Both Eyes QHS  . methylPREDNISolone (SOLU-MEDROL) injection  125 mg Intravenous 3 times per day  . [START ON 11/12/2013] oseltamivir  30 mg Oral Daily  . pantoprazole  40 mg Oral Daily  . piperacillin-tazobactam  3.375 g Intravenous 3 times per day  . potassium chloride SA  20 mEq Oral Daily  . sodium chloride  500 mL Intravenous Once  . sodium chloride  3 mL Intravenous Q12H  . timolol  1 drop Both Eyes BID  . [START ON 11/12/2013] vancomycin  750 mg Intravenous Q24H    Recommendations: Continue with current antibiotics, no indication for antifungals at this time Will stop Tamiflu Consider CT C/A/P if fever persists without a source  Assessment: He has multiple myeloma and is immunosuppressed.   He has been on prophylactic acyclovir.  Some dry cough but no obvious source.  DDx includes bacterial disease, occult disease such as abscess, multiple myeloma fever.    Antibiotics: Vancomycin day 2 Zosyn day 2  HPI: Trevor Michael is a 73 y.o. 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  He was seen 3/5 by his [facility] hematologist and recommended starting Carlfilzomib, Lenalidomide with dexamethasone, which began on 3/11.  2 days ago, he developed a fever to 101.  He was previously having some issues with constipation, toke a laxitive and developed diarrhea.  On day of admission, he got up and had a syncopal  episode and brought in to ED for evaluation.  Noted to have a fever of 104.  Some dry cough but no other localizing symptoms.  Some blood in urine/concentrated.  Started here on vancomycin, zosyn and steroids.  Blood cultures remain negative, urine culture pending, CXR with metastatic disease noted.     Review of Systems: A comprehensive review of systems was negative.  Past Medical History  Diagnosis Date  . Other and unspecified hyperlipidemia   . Essential hypertension, benign   . BPH (benign prostatic hyperplasia)   . Early stage glaucoma   . Heart murmur     "since my teens" (06/18/2012)  . Cervical stenosis of spine   . GERD (gastroesophageal reflux disease)   . Bone cancer     multiple bone lesion/right humerus  . Spinal stenosis   . Chronic kidney disease     renal insufficiency  . Arthritis   . DDD (degenerative disc disease)     c-5=-c6, and spondylosis  . Cancer 05/2012    IgA lambda multiple myeloma  . Anorexia 06/25/12    1 month hx   . Diverticulitis   . History of chemotherapy 07/01/12    Velcade,Cytoxin and decadron    History  Substance Use Topics  . Smoking status: Never Smoker   . Smokeless tobacco: Never Used  . Alcohol Use: No  Family History  Problem Relation Age of Onset  . Benign prostatic hyperplasia Father   . Pneumonia Father   . Coronary artery disease Father     PTCA  . Heart disease Brother     CABG, CAD,  . Benign prostatic hyperplasia Brother    No Known Allergies  OBJECTIVE: Blood pressure 98/49, pulse 50, temperature 97.7 F (36.5 C), temperature source Oral, resp. rate 11, height 5' 9"  (1.753 m), weight 148 lb 9.4 oz (67.4 kg), SpO2 97.00%. General: awake, alert, nad Skin: no rashes, no nodules Lungs: CTA B Cor: RRR wihtout m Abdomen: soft, nt, nd, +normoactive bowel sounds Ext: no edema HEENT: no mucositis, anicteric GU: + blood in urine, foley in place  Microbiology: Recent Results (from the past 240 hour(s))    CULTURE, BLOOD (ROUTINE X 2)     Status: None   Collection Time    11/09/13 11:00 PM      Result Value Ref Range Status   Specimen Description BLOOD LEFT ARM   Final   Special Requests BOTTLES DRAWN AEROBIC AND ANAEROBIC 10CC EA   Final   Culture  Setup Time     Final   Value: 11/10/2013 03:34     Performed at [facility]   Culture     Final   Value:        BLOOD CULTURE RECEIVED NO GROWTH TO DATE CULTURE WILL BE HELD FOR 5 DAYS BEFORE ISSUING A FINAL NEGATIVE REPORT     Performed at [facility]   Report Status PENDING   Incomplete  CULTURE, BLOOD (ROUTINE X 2)     Status: None   Collection Time    11/09/13 11:10 PM      Result Value Ref Range Status   Specimen Description BLOOD RIGHT ARM   Final   Special Requests BOTTLES DRAWN AEROBIC ONLY [place]   Final   Culture  Setup Time     Final   Value: 11/10/2013 03:34     Performed at [facility]   Culture     Final   Value:        BLOOD CULTURE RECEIVED NO GROWTH TO DATE CULTURE WILL BE HELD FOR 5 DAYS BEFORE ISSUING A FINAL NEGATIVE REPORT     Performed at [facility]   Report Status PENDING   Incomplete    Rachna Schonberger, Herbie [place], [facility] [email or web address] [phone] pager  [phone] cell 11/11/2013, 1:24 PM

## 2013-11-11 NOTE — H&P (Signed)
[facility] Disease     Reason for Consult: PUO (fever highest spike: 105.9 F on 3/24): MM patient   Referring Physician: Doree Fudge   Active Problems:   HYPERTENSION, MILD   Multiple myeloma   Fever   Syncope   Dehydration   Acute respiratory failure   . acyclovir  400 mg Oral BID  . allopurinol  100 mg Oral Daily  . aspirin EC  325 mg Oral Daily  . brimonidine  1 drop Both Eyes BID  . docusate sodium  100 mg Oral BID  . latanoprost  1 drop Both Eyes QHS  . methylPREDNISolone (SOLU-MEDROL) injection  125 mg Intravenous 3 times per day  . [START ON 11/12/2013] oseltamivir  30 mg Oral Daily  . pantoprazole  40 mg Oral Daily  . piperacillin-tazobactam  3.375 g Intravenous 3 times per day  . potassium chloride SA  20 mEq Oral Daily  . sodium chloride  3 mL Intravenous Q12H  . timolol  1 drop Both Eyes BID  . [START ON 11/12/2013] vancomycin  750 mg Intravenous Q24H    Recommendations: Antibiotic prophylaxis for possible infections considering that wbc levels are low- consider also bactrim: TMP-SMZ,IVIG if required Await culture reports Look for possible infections  Assessment: ->PUO:afebrile right now: 97.7 Spiked 105.9-3/24 104.2-3/23  ->Hemolytic anemia: LDH: 1933 (high) Haptoglobin: <25 (low) #CBC differential and metabolic profile on 0/45: Total bilirubin: 4.3  Low haemoglobin: 7.2 Low RBC count: 2.14 Platelets: 20 (low) WBCs: 2.6 (low), relative monocytes are high: 16?  Tropnin: 3/24- high: 0.73 (due to cardiac/ CKD cause?)   -Likely causes: Pneumonias: No respiratory complaints as such. CTAB. Hepatitis: slight jaundice, no HSM, no tendereness, fatigue: LFTs: AST raised, protein: low, ALT normal EBV: no palpable lymphadnopathy/ other signs of HSM, hairy leukoplakia - CMV: no pneumonitis/ other SS as such Typhoid: poor evidence of infection of infection induced diarrhea Mycoplasma: no evidence of pneumonia on S/S or X  ray Leukemia/ lymphoma  -Possible infections in multiple myeloma:  HSV: no lesions Bacterial infections/ also C diff Pneumocystis Mucosal candidiasis: no oral thrush/ evidence of  Influenza: negative on test. No S/S Tuberculosis  ->Renal status: BUN: 36 Cr: 2.38 Ca: 7.7 (expected to be high in multiple myeloma but may have gotten low due to concurrent osteopenia) Albumin in serum: 3.2 Urine analysis:  Colour: red HgB : large Bilirubin: Moderate Ketones: present Proteins: 100 (high) Nitrite: positive  ->ABG: alkalosis pH, Arterial 7.475 (H) 7.350 - 7.450 Final pCO2 arterial 21.5 (L) 35.0 - 45.0 mmHg Final pO2, Arterial 83.7 80.0 - 100.0 mmHg Final Bicarbonate 15.6 (L) 20.0 - 24.0 mEq/L Final TCO2 16.3 0 - 100 mmol/L Final Acid-base deficit 7.4 (H) 0.0 - 2.0 mmol/L Final Lactic acid:3/23: normal  ->Blood cultures: 3/23: No growth/ org as yet pending Influenza PCR A/B and H1N1: negative IgG (Immunoglobin G), Serum 144 (L) 650 - 1600 mg/dL Final   IgA 1000 (H) 68 - 379 mg/dL    IgM, Serum 12 (L) 41 - 251 mg/dL       Kappa free light chain 0.03 (L) 0.33 - 1.94 mg/dL  Lambda Free Light Chn 109.00 (H) 0.57 - 2.63 mg/dL   Kappa:Lambda Ratio 0.00 (L) 0.26 - 1.65   ->Radiographic findings: CXR: 3/24- Innumerable lucencies noted throughout the thoracic cage  consistent with myeloma and/or metastatic disease. CXR:3/23- Mild cardiomegaly and mild interstitial prominence suggest pulmonary  edema without focal consolidation  Antibiotics: Osetamivir: 3/24>> Zosyn: 3/23>> Vancomycin:3/23>> Acyclovir: 3/24>>  HPI: Trevor Michael is a 73 y.o. 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 Denies any such previous episodes. Approximately 2 weeks ago,  patient was transfused with and felt light headed and short of breath prior to that transfusion but both the SOB and the lightheadedness disappeared after the patient got the transfusion. No history of headaches, seizures. No complaints of shortness of breath, productive cough, nasal congestion, chest congestion, chest pain. The patient does have dry cough at times but it is not of new onset. He had diarrhea the day before he got to the hospital (around 5 bowel movements in half a day) but that followed around 2-3 days of constipation after which the patient took 2 pills of laxatives. No blood in stools. No vomiting.The night before he got here, he had nausea and so took 0.1m of lorazepam, also the patient had not been sleeping well in the past 2-3 nights. No bleeding/ bruise marks/ rash/ skin lesions. His urine color was his usual yellow till the day he got here, when he had to strain to get it out and the color was dark too (initially probably due to dehydration induced by diarrhea). No complaints of burning, urgency of urination. No arthritis.   Review of Systems: A comprehensive review of systems was negative.  Past Medical History  Diagnosis Date  . Other and unspecified hyperlipidemia   . Essential hypertension, benign   . BPH (benign prostatic hyperplasia)   . Early stage glaucoma   . Heart murmur     "since my teens" (06/18/2012)  . Cervical stenosis of spine   . GERD (gastroesophageal reflux disease)   . Bone cancer     multiple bone lesion/right humerus  . Spinal stenosis   . Chronic kidney disease     renal insufficiency  . Arthritis   . DDD (degenerative disc disease)     c-5=-c6, and spondylosis  . Cancer 05/2012    IgA lambda multiple myeloma  . Anorexia 06/25/12    1 month hx   . Diverticulitis   . History of chemotherapy 07/01/12    Velcade,Cytoxin and decadron    History  Substance Use Topics  . Smoking status: Never Smoker   . Smokeless tobacco: Never Used  .  Alcohol Use: No    Family History  Problem Relation Age of Onset  . Benign prostatic hyperplasia Father   . Pneumonia Father   . Coronary artery disease Father     PTCA  . Heart disease Brother     CABG, CAD,  . Benign prostatic hyperplasia Brother    No Known Allergies  OBJECTIVE: Blood pressure 133/47, pulse 49, temperature 97.7 F (36.5 C), temperature source Oral, resp. rate 15, height 5' 9"  (1.753 m), weight 67.4 kg (148 lb 9.4 oz), SpO2 100.00%. General: A+Ox3. NAD Skin: No rashes/ bruises/ cutaneous lesions Lungs: CTAB Cor: RRR. No m Abdomen: appeared slightly distended, Soft. Nt. NBS. No HSM.  Ext: no edema CNS: sensory, motor, cranial nerves II-XII intact Oral cavity: No evidence of cyanosis, ulcers/ mucocutaneous lesions (HSV) No lymphadnopathy, no clubbing, no raised JVP, pallor present, jaundice: tinch in conjuctiva  Microbiology: Recent Results (from the past 240 hour(s))  CULTURE, BLOOD (ROUTINE X 2)     Status: None   Collection Time  11/09/13 11:00 PM      Result Value Ref Range Status   Specimen Description BLOOD LEFT ARM   Final   Special Requests BOTTLES DRAWN AEROBIC AND ANAEROBIC 10CC EA   Final   Culture  Setup Time     Final   Value: 11/10/2013 03:34     Performed at [facility]   Culture     Final   Value:        BLOOD CULTURE RECEIVED NO GROWTH TO DATE CULTURE WILL BE HELD FOR 5 DAYS BEFORE ISSUING A FINAL NEGATIVE REPORT     Performed at [facility]   Report Status PENDING   Incomplete  CULTURE, BLOOD (ROUTINE X 2)     Status: None   Collection Time    11/09/13 11:10 PM      Result Value Ref Range Status   Specimen Description BLOOD RIGHT ARM   Final   Special Requests BOTTLES DRAWN AEROBIC ONLY [place]   Final   Culture  Setup Time     Final   Value: 11/10/2013 03:34     Performed at [facility]   Culture     Final   Value:        BLOOD CULTURE RECEIVED NO GROWTH TO DATE CULTURE WILL BE HELD FOR 5 DAYS BEFORE  ISSUING A FINAL NEGATIVE REPORT     Performed at [facility]   Report Status PENDING   Incomplete    Janalyn Shy, elective student [place] [facility] Infect[facility] [email or web address] [phone] pager  [phone] cell 11/11/2013, 12:46 PM

## 2013-11-12 ENCOUNTER — Inpatient Hospital Stay (HOSPITAL_COMMUNITY): Payer: Medicare Other

## 2013-11-12 DIAGNOSIS — D61810 Antineoplastic chemotherapy induced pancytopenia: Secondary | ICD-10-CM | POA: Diagnosis present

## 2013-11-12 DIAGNOSIS — R112 Nausea with vomiting, unspecified: Secondary | ICD-10-CM | POA: Diagnosis present

## 2013-11-12 DIAGNOSIS — IMO0002 Reserved for concepts with insufficient information to code with codable children: Secondary | ICD-10-CM | POA: Diagnosis present

## 2013-11-12 DIAGNOSIS — J811 Chronic pulmonary edema: Secondary | ICD-10-CM | POA: Diagnosis present

## 2013-11-12 DIAGNOSIS — N179 Acute kidney failure, unspecified: Secondary | ICD-10-CM

## 2013-11-12 DIAGNOSIS — R778 Other specified abnormalities of plasma proteins: Secondary | ICD-10-CM | POA: Diagnosis present

## 2013-11-12 DIAGNOSIS — R7989 Other specified abnormal findings of blood chemistry: Secondary | ICD-10-CM

## 2013-11-12 DIAGNOSIS — D596 Hemoglobinuria due to hemolysis from other external causes: Secondary | ICD-10-CM

## 2013-11-12 LAB — BASIC METABOLIC PANEL
BUN: 56 mg/dL — ABNORMAL HIGH (ref 6–23)
CALCIUM: 6.9 mg/dL — AB (ref 8.4–10.5)
CO2: 18 mEq/L — ABNORMAL LOW (ref 19–32)
Chloride: 95 mEq/L — ABNORMAL LOW (ref 96–112)
Creatinine, Ser: 3.59 mg/dL — ABNORMAL HIGH (ref 0.50–1.35)
GFR calc Af Amer: 18 mL/min — ABNORMAL LOW (ref >90)
GFR, EST NON AFRICAN AMERICAN: 16 mL/min — AB (ref >90)
Glucose, Bld: 157 mg/dL — ABNORMAL HIGH (ref 70–99)
POTASSIUM: 3.4 meq/L — AB (ref 3.7–5.3)
SODIUM: 132 meq/L — AB (ref 137–147)

## 2013-11-12 LAB — TRANSFUSION REACTION
DAT C3: NEGATIVE
POST RXN DAT IGG: NEGATIVE

## 2013-11-12 LAB — TROPONIN I: Troponin I: 0.33 ng/mL (ref <0.30)

## 2013-11-12 LAB — URINE CULTURE
Colony Count: NO GROWTH
Culture: NO GROWTH

## 2013-11-12 MED ORDER — VANCOMYCIN HCL IN DEXTROSE 1-5 GM/200ML-% IV SOLN
1000.0000 mg | INTRAVENOUS | Status: DC
  Filled 2013-11-12: qty 200

## 2013-11-12 MED ORDER — BOOST / RESOURCE BREEZE PO LIQD
1.0000 | Freq: Every day | ORAL | Status: DC
  Administered 2013-11-12 – 2013-11-17 (×6): 1 via ORAL

## 2013-11-12 MED ORDER — DIPHENHYDRAMINE HCL 50 MG/ML IJ SOLN
25.0000 mg | Freq: Once | INTRAMUSCULAR | Status: AC
  Administered 2013-11-12: 25 mg via INTRAVENOUS
  Filled 2013-11-12: qty 1

## 2013-11-12 MED ORDER — PIPERACILLIN-TAZOBACTAM IN DEX 2-0.25 GM/50ML IV SOLN
2.2500 g | Freq: Three times a day (TID) | INTRAVENOUS | Status: DC
  Administered 2013-11-12: 2.25 g via INTRAVENOUS
  Filled 2013-11-12 (×6): qty 50

## 2013-11-12 NOTE — Progress Notes (Signed)
[facility] for Infectious Disease  Date of Admission:  11/09/2013  Antibiotics:  Subjective: No complaints, afebrile since admission  Objective: Temp:  [97.4 F (36.3 C)-98.2 F (36.8 C)] 98.2 F (36.8 C) (03/26 0739) Pulse Rate:  [38-120] 57 (03/26 0739) Resp:  [11-23] 16 (03/26 0739) BP: (57-133)/(33-98) 120/54 mmHg (03/26 0739) SpO2:  [85 %-100 %] 99 % (03/26 0739)  General: awake, in bed, nad Skin: no rashes Lungs: CTA B Cor: RRR wihtout m Abdomen: soft, nt, nd, +normoactive bs Ext: no edema  Lab Results Lab Results  Component Value Date   WBC 2.6* 11/11/2013   HGB 7.2* 11/11/2013   HCT 20.4* 11/11/2013   MCV 95.3 11/11/2013   PLT 20* 11/11/2013    Lab Results  Component Value Date   CREATININE 3.59* 11/12/2013   BUN 56* 11/12/2013   NA 132* 11/12/2013   K 3.4* 11/12/2013   CL 95* 11/12/2013   CO2 18* 11/12/2013    Lab Results  Component Value Date   ALT 20 11/11/2013   AST 110* 11/11/2013   ALKPHOS 84 11/11/2013   BILITOT 4.3* 11/11/2013      Microbiology: Recent Results (from the past 240 hour(s))  CULTURE, BLOOD (ROUTINE X 2)     Status: None   Collection Time    11/09/13 11:00 PM      Result Value Ref Range Status   Specimen Description BLOOD LEFT ARM   Final   Special Requests BOTTLES DRAWN AEROBIC AND ANAEROBIC 10CC EA   Final   Culture  Setup Time     Final   Value: 11/10/2013 03:34     Performed at [facility]   Culture     Final   Value:        BLOOD CULTURE RECEIVED NO GROWTH TO DATE CULTURE WILL BE HELD FOR 5 DAYS BEFORE ISSUING A FINAL NEGATIVE REPORT     Performed at [facility]   Report Status PENDING   Incomplete  CULTURE, BLOOD (ROUTINE X 2)     Status: None   Collection Time    11/09/13 11:10 PM      Result Value Ref Range Status   Specimen Description BLOOD RIGHT ARM   Final   Special Requests BOTTLES DRAWN AEROBIC ONLY [place]   Final   Culture  Setup Time     Final   Value: 11/10/2013 03:34     Performed at  [facility]   Culture     Final   Value:        BLOOD CULTURE RECEIVED NO GROWTH TO DATE CULTURE WILL BE HELD FOR 5 DAYS BEFORE ISSUING A FINAL NEGATIVE REPORT     Performed at [facility]   Report Status PENDING   Incomplete    Studies/Results: X-ray Chest Pa And Lateral   11/10/2013   CLINICAL DATA:  Fever.  EXAM: CHEST  2 VIEW  COMPARISON:  11/09/2013  FINDINGS: The heart size and mediastinal contours are within normal limits. Both lungs are clear. The visualized skeletal structures are unremarkable.  IMPRESSION: No active cardiopulmonary disease.   Electronically Signed   By: Earle Gell M.D.   On: 11/10/2013 13:55   Dg Chest Port 1 View  11/10/2013   CLINICAL DATA:  Shortness of breath.  EXAM: PORTABLE CHEST - 1 VIEW  COMPARISON:  DG CHEST 2 VIEW dated 11/10/2013; DG CHEST 1V PORT dated 11/09/2013  FINDINGS: Mediastinum and hilar structures are normal. Cardiomegaly. Pulmonary vascularity  is normal. No pleural effusion or pneumothorax. Multiple lucencies are noted throughout the thoracic cage. This is consistent with myeloma and/or metastatic disease.  IMPRESSION: 1. No acute cardiopulmonary disease.  Stable cardiomegaly. 2. Innumerable lucencies noted throughout the thoracic cage consistent with myeloma and/or metastatic disease.   Electronically Signed   By: Marcello Moores  Register   On: 11/10/2013 18:41    Assessment/Plan: 1)  Fever - unknown etiology.  No recent fever since admission.  ANC over 1500, not considered neutropenic.  If fever returns, consider CT scan.  Negative cultures.  Continue with broad spectrum antibiotics for now.  Possibly MM fever.    Scharlene Gloss, [facility] [place] [email or web address] [phone] pager   [phone] cell 11/12/2013, 8:57 AM

## 2013-11-12 NOTE — Progress Notes (Signed)
[name]   DOB:1941/01/14   LF#:[id number]   HEN#:[id number]  Subjective: Patient sitting in chair. Transferred from the ICU to regular floor. He reports feeling much better. His urine has cleared.  Nephrology following.     Objective:  Filed Vitals:   11/12/13 1632  BP: 128/61  Pulse: 65  Temp: 97.9 F (36.6 C)  Resp: 22    Body mass index is 21.93 kg/(m^2).  Intake/Output Summary (Last 24 hours) at 11/12/13 1837 Last data filed at 11/12/13 1636  Gross per 24 hour  Intake   2253 ml  Output   1375 ml  Net    878 ml     Sclerae non icteric, non-toxic appearing, AAOx3  Oropharynx clear  Lungs clear -- no rales or rhonchi  Heart bradycardic soft murmur  Abdomen benign, slightly distended  MSK no no peripheral edema  Neuro nonfocal              Foley draining clear yellowish urine  Labs:  Lab Results  Component Value Date   WBC 2.6* 11/11/2013   HGB 7.2* 11/11/2013   HCT 20.4* 11/11/2013   MCV 95.3 11/11/2013   PLT 20* 11/11/2013   NEUTROABS 1.6* 3/61/4431   Basic Metabolic Panel:  Recent Labs Lab 11/08/13 1300 11/09/13 2318 11/10/13 0341 11/11/13 0221 11/12/13 0107  NA 140 129* 133* 133* 132*  K 3.8 3.4* 3.8 3.6* 3.4*  CL 100 94* 94* 93* 95*  CO2 25  --  20 19 18*  GLUCOSE 100* 118* 105* 158* 157*  BUN _0 36* 56*  CREATININE 1.10 1.40* 1.31 2.38* 3.59*  CALCIUM 8.1*  --  8.2* 7.7* 6.9*  MG  --   --  2.0  --   --   PHOS  --   --  2.5  --   --    GFR Estimated Creatinine Clearance: 17.7 ml/min (by C-G formula based on Cr of 3.59). Liver Function Tests:  Recent Labs Lab 11/09/13 2310 11/10/13 0341 11/11/13 0221  AST 42* 50* 110*  ALT _1 ALKPHOS 98 104 84  BILITOT 2.8* 3.7* 4.3*  PROT 5.9* 6.3 5.8*  ALBUMIN 3.5 3.7 3.2*    CBC:  Recent Labs Lab 11/06/13 1027 11/08/13 1300 11/09/13 2310 11/09/13 2318 11/10/13 0341 11/11/13 0221  WBC 2.6* 2.3* 2.4*  --  2.3* 2.6*  NEUTROABS 1.2* 1.4* 1.4*  --   --  1.6*  HGB 7.9* 8.1* 7.3*  7.1* 7.6* 7.2*  HCT 23.3* 23.4* 20.2* 21.0* 20.8* 20.4*  MCV 91.4 92.9 92.2  --  94.1 95.3  PLT 22* 21* 18*  --  19* 20*    Cardiac Enzymes:  Recent Labs Lab 11/09/13 2310  11/10/13 2013 11/10/13 2240 11/11/13 1003 11/11/13 1926 11/12/13 0102  CKTOTAL 75  --   --   --   --   --   --   TROPONINI  --   < > 0.40* 0.73* 0.55* 0.34* 0.33*  < > = values in this interval not displayed. Lipid Profile No results found for this basename: CHOL, HDL, LDLCALC, TRIG, CHOLHDL, LDLDIRECT,  in the last 72 hours Thyroid function studies  Recent Labs  11/10/13 0341  TSH 0.618    Studies:  US Renal  11/12/2013   CLINICAL DATA:  Acute renal failure.  Multiple myeloma.  EXAM: RENAL/URINARY TRACT ULTRASOUND COMPLETE  COMPARISON:  CT scan abdomen and pelvis dated 06/18/2012  FINDINGS: Right Kidney:  Length: 13.1 cm. Slight increased  echogenicity of the renal parenchyma. Multiple renal cysts. The largest is 6.5 cm on the mid to lower pole. No hydronephrosis or solid mass.  Left Kidney:  Length: 13.2 cm. 5.5 cm cyst on the lateral aspect of the mid left kidney, minimally larger than on the prior CT scan. No hydronephrosis. Slight increased echogenicity of the renal parenchyma.  Bladder:  Foley catheter in place.  The bladder is empty.  IMPRESSION: Echogenic renal parenchyma consistent with renal medical disease. No obstruction. Bilateral renal cysts, stable.   Electronically Signed   By: Rozetta Nunnery M.D.   On: 11/12/2013 16:20    Assessment: 73 y.o.  1. 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 Questionable transfusion reaction 3. Febrile illness with syncope 4. Pancytopenia secondary to chemo/MM 5. Urinary flow changes 6. MM S/p SCT on salvage chemotherapy w Calfizomib plus dexamethasone (MM markers demonstrate response to disease with improvement in counts).  7. AKI (preadmission creatine 1.1) 8. 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 Elevated troponin likely demand ischemia secondary  #2   Recommendations:   1. Continue IVIG 400 mg/kg per day x 2 more days secondary to #1 and #8  2. HOLD transfusion of blood products unless hemodynamically unstable.  3. HOLD chemotherapy in the setting of above.  4. Continue steroids for a total of 3 days.  5. Protonix 40 mg daily  We will follow [name] with you.    Liston Thum, MD 11/12/2013  6:37 PM

## 2013-11-12 NOTE — Consult Note (Signed)
Trevor Michael is an 73 y.o. male referred by Dr Wynelle Cleveland   Chief Complaint: ARF HPI: 73yo BM with multiple myeloma admitted 11/10/13 for fevers. He had relatively normal renal fx PTA and developed hypotension shortly after admission with SBP as low as the 60's. Scr .9 -1.1 about 1 week ago and 1.4 on admission.  Since then Scr has risen to 3.59.  UO has been good 1.2-3.1 liters/d.  His myeloma has been tx with autologous BM tx and melphalan 6/14, then velcade and most recently kyprolis (last dose 11/03/13). No hx gross hematuria until after foley placed.  No NSAID's,ARB or ACE.  Renal US 2013, 2 kidneys, mild increase echogenicity.  Past Medical History  Diagnosis Date  . Other and unspecified hyperlipidemia   . Essential hypertension, benign   . BPH (benign prostatic hyperplasia)   . Early stage glaucoma   . Heart murmur     "since my teens" (06/18/2012)  . Cervical stenosis of spine   . GERD (gastroesophageal reflux disease)   . Bone cancer     multiple bone lesion/right humerus  . Spinal stenosis   . Chronic kidney disease     renal insufficiency  . Arthritis   . DDD (degenerative disc disease)     c-5=-c6, and spondylosis  . Cancer 05/2012    IgA lambda multiple myeloma  . Anorexia 06/25/12    1 month hx   . Diverticulitis   . History of chemotherapy 07/01/12    Velcade,Cytoxin and decadron    Past Surgical History  Procedure Laterality Date  . Lipoma excision  07/1986    left shoulder/scapula  . Esophagogastroduodenoscopy  06/20/2012    Procedure: ESOPHAGOGASTRODUODENOSCOPY (EGD);  Surgeon: Beryle Beams, MD;  Location: [facility] ENDOSCOPY;  Service: Endoscopy;  Laterality: N/A;  . Prostate biopsy  2008    benign    Family History  Problem Relation Age of Onset  . Benign prostatic hyperplasia Father   . Pneumonia Father   . Coronary artery disease Father     PTCA  . Heart disease Brother     CABG, CAD,  . Benign prostatic hyperplasia Brother   No FH of renal  disease  Social History:  reports that he has never smoked. He has never used smokeless tobacco. He reports that he does not drink alcohol or use illicit drugs. Retired Technical sales engineer, Married, lives with wife.  One daughter that died from Lupus.  N  Allergies: No Known Allergies  Medications Prior to Admission  Medication Sig Dispense Refill  . acetaminophen (TYLENOL) 500 MG tablet Take 1,000 mg by mouth every 6 (six) hours as needed. For pain      . acyclovir (ZOVIRAX) 400 MG tablet Take 400 mg by mouth 2 (two) times daily.      Marland Kitchen allopurinol (ZYLOPRIM) 100 MG tablet Take 100 mg by mouth daily.      Marland Kitchen atenolol-chlorthalidone (TENORETIC) 50-25 MG per tablet Take 1 tablet by mouth daily.      . bimatoprost (LUMIGAN) 0.03 % ophthalmic drops Place 1 drop into both eyes at bedtime.        . brimonidine-timolol (COMBIGAN) 0.2-0.5 % ophthalmic solution Place 1 drop into both eyes 2 (two) times daily.        Marland Kitchen dexamethasone (DECADRON) 4 MG tablet Take 40 mg by mouth once a week. Takes 4 tablets on days 18,15,and 22 of chemo last dose was 11/03/13      . esomeprazole (NEXIUM) 40 MG capsule  Take 40 mg by mouth daily as needed (acid reflux).       Marland Kitchen lenalidomide (REVLIMID) 10 MG capsule Take 10 mg by mouth every morning.       . loperamide (IMODIUM) 2 MG capsule Take 2 mg by mouth as needed for diarrhea or loose stools.      Marland Kitchen loratadine (CLARITIN) 10 MG tablet Take 10 mg by mouth daily as needed for allergies.      Marland Kitchen LORazepam (ATIVAN) 1 MG tablet Take 0.5 mg by mouth every 8 (eight) hours as needed for anxiety or sleep (nausea).      . potassium chloride SA (K-DUR,KLOR-CON) 20 MEQ tablet Take 20 mEq by mouth daily.      . prochlorperazine (COMPAZINE) 10 MG tablet Take 5-10 mg by mouth every 6 (six) hours as needed for nausea or vomiting.       . senna (SENOKOT) 8.6 MG TABS tablet Take 1-2 tablets by mouth daily as needed for mild constipation.       . simvastatin (ZOCOR) 20 MG tablet Take 20 mg  by mouth at bedtime.      Marland Kitchen PRESCRIPTION MEDICATION Receiving chemotherapy [facility] (Dr. Juliann Mule) with carfilzomib (Kyprolis) and dexamethasone.  Last dose was given 11/04/13.         Lab Results: UA: 0-2 wbc, 3-6 rbc, 100 protein, 15 ketone   Recent Labs  11/09/13 2310 11/09/13 2318 11/10/13 0341 11/11/13 0221  WBC 2.4*  --  2.3* 2.6*  HGB 7.3* 7.1* 7.6* 7.2*  HCT 20.2* 21.0* 20.8* 20.4*  PLT 18*  --  19* 20*   BMET  Recent Labs  11/10/13 0341 11/11/13 0221 11/12/13 0107  NA 133* 133* 132*  K 3.8 3.6* 3.4*  CL 94* 93* 95*  CO2 20 19 18*  GLUCOSE 105* 158* 157*  BUN 20 36* 56*  CREATININE 1.31 2.38* 3.59*  CALCIUM 8.2* 7.7* 6.9*  PHOS 2.5  --   --    LFT  Recent Labs  11/09/13 2310  11/11/13 0221  PROT 5.9*  < > 5.8*  ALBUMIN 3.5  < > 3.2*  AST 42*  < > 110*  ALT 14  < > 20  ALKPHOS 98  < > 84  BILITOT 2.8*  < > 4.3*  BILIDIR 0.3  --   --   IBILI 2.5*  --   --   < > = values in this interval not displayed. Dg Chest Port 1 View  11/10/2013   CLINICAL DATA:  Shortness of breath.  EXAM: PORTABLE CHEST - 1 VIEW  COMPARISON:  DG CHEST 2 VIEW dated 11/10/2013; DG CHEST 1V PORT dated 11/09/2013  FINDINGS: Mediastinum and hilar structures are normal. Cardiomegaly. Pulmonary vascularity is normal. No pleural effusion or pneumothorax. Multiple lucencies are noted throughout the thoracic cage. This is consistent with myeloma and/or metastatic disease.  IMPRESSION: 1. No acute cardiopulmonary disease.  Stable cardiomegaly. 2. Innumerable lucencies noted throughout the thoracic cage consistent with myeloma and/or metastatic disease.   Electronically Signed   By: Marcello Moores  Register   On: 11/10/2013 18:41    ROS: No change in vision No SOB No CP No change in bowels Some decrease flow of urine PTA No arthritic CO No neuropathic CO No edema  PHYSICAL EXAM: Blood pressure 121/64, pulse 56, temperature 98 F (36.7 C), temperature source Oral, resp. rate 20, height _0  (1.753  m), weight 67.4 kg (148 lb 9.4 oz), SpO2 100.00%. HEENT: PERRLA EOMI LUNGS:Clear Cardiac: RRR wo  MRG ABD:+ BS NTND No HSM Ext: No edema NEURO:CNI M&SI Ox3 No asterixis Assessment: 1. ARF, non-oliguric sec to ischemic ATN as renal fx fairly stable until the hypotensive episode.  I think MM and/or kyprolis are less likely culprits at this time 2. ? Hemolytic anemia 3. Pancytopenia 4. MM 5. Hypotension, improved PLAN: 1. Renal diet 2. Renal US 3.  DC KCL daily and give prn 4.  Daily Scr 5.  His renal fx should recover, the question is could he require HD before that happens.  I discussed this possibility with pt.  Joley Utecht T 11/12/2013, 1:15 PM

## 2013-11-12 NOTE — Progress Notes (Addendum)
INITIAL NUTRITION ASSESSMENT  DOCUMENTATION CODES Per approved criteria  -Not Applicable   INTERVENTION: Resource Breeze po daily, each supplement provides 250 kcal and 9 grams of protein RD to follow for nutrition care plan  NUTRITION DIAGNOSIS: Increased nutrient needs related to catabolic illness as evidenced by estimated nutrition needs  Goal: Pt to meet >/= 90% of their estimated nutrition needs   Monitor:  PO & supplemental intake, weight, labs, I/O's  Reason for Assessment: Malnutrition Screening Tool Report  73 y.o. male  Admitting Dx: syncope  ASSESSMENT: 73 year old male patient with multiple myeloma status post stem cell transplant January 2014 undergoing chemotherapy. Presented to the ER with altered mentation. He had been seen in the ER during the previous 24 hours with complaints of weakness and dizziness. He was subsequently discharged home after an unremarkable workup.   He returned to the ER after having a syncopal episode in the bathroom with subsequent altered mentation. EMS was called to the home and they found the patient to be very disoriented to time and situation but without focal neurological deficits.   On arrival he was noted to have a rectal temperature of 104.2. Additional history meds at the patient has been experiencing severe anemia related to his ongoing chemotherapy and has required blood transfusions as recently as 2 weeks prior.   Patient not in room upon RD visit; spoke with pt's sister and brother-in-law re: nutrition hx; report pt usually consumes 3 meals per day -- Breakfast: Oatmeal, Lunch: Sandwich, Dinner: Meat, vegetable & starch.  Patient has ongoing nausea from chemotherapy; drinks Ensure supplements at home as needed; per weight readings, pt down ~ 20 lbs in < weeks -- noted pt talking laxatives for constipation and having diarrhea PTA; RD discussed adding clear liquid supplement (ie Resource) for pt to try given ARF and GI distress --  sister amenable  RD unable to complete Nutrition Focused Physical Exam at this time.  Height: Ht Readings from Last 1 Encounters:  11/10/13 5' 9" (1.753 m)    Weight: Wt Readings from Last 1 Encounters:  11/10/13 148 lb 9.4 oz (67.4 kg)    Ideal Body Weight: 160 lb  % Ideal Body Weight: 92%  Wt Readings from Last 10 Encounters:  11/10/13 148 lb 9.4 oz (67.4 kg)  10/30/13 168 lb 14.4 oz (76.613 kg)  09/29/13 165 lb 11.2 oz (75.161 kg)  09/09/13 168 lb 8 oz (76.431 kg)  08/04/13 171 lb 4.8 oz (77.701 kg)  07/07/13 168 lb 4.8 oz (76.34 kg)  03/10/13 148 lb 12.8 oz (67.495 kg)  02/19/13 153 lb 12.8 oz (69.763 kg)  01/02/13 161 lb 4.8 oz (73.165 kg)  12/05/12 161 lb 11.2 oz (73.347 kg)    Usual Body Weight: 168 lb  % Usual Body Weight: 88%  BMI:  Body mass index is 21.93 kg/(m^2).  Estimated Nutritional Needs: Kcal: 2000-2200 Protein: 100-110 gm Fluid: 1200 ml  Skin: Intact  Diet Order: Renal W/1200 ml fluid restriction  EDUCATION NEEDS: -No education needs identified at this time   Intake/Output Summary (Last 24 hours) at 11/12/13 1546 Last data filed at 11/12/13 1500  Gross per 24 hour  Intake   2598 ml  Output   1275 ml  Net   1323 ml    Labs:   Recent Labs Lab 11/10/13 0341 11/11/13 0221 11/12/13 0107  NA 133* 133* 132*  K 3.8 3.6* 3.4*  CL 94* 93* 95*  CO2 20 19 18*  BUN 20 36* 56*  CREATININE 1.31 2.38* 3.59*  CALCIUM 8.2* 7.7* 6.9*  MG 2.0  --   --   PHOS 2.5  --   --   GLUCOSE 105* 158* 157*    Scheduled Meds: . acyclovir  400 mg Oral BID  . allopurinol  100 mg Oral Daily  . aspirin EC  325 mg Oral Daily  . brimonidine  1 drop Both Eyes BID  . docusate sodium  100 mg Oral BID  . IMMUNE GLOBULIN 10% (HUMAN) IV - For Fluid Restriction Only  400 mg/kg Intravenous Q24H  . latanoprost  1 drop Both Eyes QHS  . methylPREDNISolone (SOLU-MEDROL) injection  125 mg Intravenous 3 times per day  . pantoprazole  40 mg Oral Daily  .  piperacillin-tazobactam (ZOSYN)  IV  2.25 g Intravenous 3 times per day  . sodium chloride  3 mL Intravenous Q12H  . timolol  1 drop Both Eyes BID  . [START ON 11/13/2013] vancomycin  1,000 mg Intravenous Q48H    Continuous Infusions: . sodium chloride 100 mL/hr (11/12/13 1036)    Past Medical History  Diagnosis Date  . Other and unspecified hyperlipidemia   . Essential hypertension, benign   . BPH (benign prostatic hyperplasia)   . Early stage glaucoma   . Heart murmur     "since my teens" (06/18/2012)  . Cervical stenosis of spine   . GERD (gastroesophageal reflux disease)   . Bone cancer     multiple bone lesion/right humerus  . Spinal stenosis   . Chronic kidney disease     renal insufficiency  . Arthritis   . DDD (degenerative disc disease)     c-5=-c6, and spondylosis  . Cancer 05/2012    IgA lambda multiple myeloma  . Anorexia 06/25/12    1 month hx   . Diverticulitis   . History of chemotherapy 07/01/12    Velcade,Cytoxin and decadron    Past Surgical History  Procedure Laterality Date  . Lipoma excision  07/1986    left shoulder/scapula  . Esophagogastroduodenoscopy  06/20/2012    Procedure: ESOPHAGOGASTRODUODENOSCOPY (EGD);  Surgeon: Beryle Beams, MD;  Location: [facility] ENDOSCOPY;  Service: Endoscopy;  Laterality: N/A;  . Prostate biopsy  2008    benign    Arthur Holms, RD, LDN Pager #: [phone] After-Hours Pager #: [phone]

## 2013-11-12 NOTE — Plan of Care (Signed)
Labs reviewed. Note progressive renal failure. Baseline Scr Mar 2015 0.9 and now up to 0.98 with new metabolic acidosis. Nephrology consulted. Note pt w/ history BM due to MM and is chronically immunosuppressed. Also with new non-immune hemolysis. Admitted with high fevers of unclear etiology. ID and Oncology also following.  Erin Hearing ANP

## 2013-11-12 NOTE — Progress Notes (Signed)
Pharmacy: Vancomycin and Zosyn  72yom continues on day #3 antibiotics for sepsis. Renal function continues to worsen (1.31>2.38>3.59) with CrCl now about 61ml/min. Will need to adjust doses.  Vancomycin 3/24>> Zosyn 3/24>> Tamiflu 3/24>>3/25 Acyclovir 400 po bid PTA for px  Plan: 1) Change zosyn to 2.25g IV q8 2) Change vancomycin to 1g IV q48  Nena Jordan, PharmD, BCPS 11/12/2013, 1:09 PM

## 2013-11-12 NOTE — Progress Notes (Signed)
[facility]  Trevor Michael HYW:[id number] DOB: 09-May-1941 DOA: 11/09/2013 PCP: Adella Hare, MD  Brief narrative: 73 year old male patient with multiple myeloma status post stem cell transplant January 2014 undergoing chemotherapy. Presented to the ER on 11/09/2013 with altered mentation. He had been seen in the ER during the previous 24 hours with complaints of weakness and dizziness. He was subsequently discharged home after an unremarkable workup. He was pancytopenic but this was stable and at baseline.  He returned to the ER after having a syncopal episode in the bathroom with subsequent altered mentation. EMS was called to the home and they found the patient to be very disoriented to time and situation but without focal neurological deficits. On arrival he was noted to have a rectal temperature of 104.2. Additional history meds at the patient has been experiencing severe anemia related to his ongoing chemotherapy and has required blood transfusions as recently as 2 weeks prior. He also is experiencing decreased oral intake and having diarrhea after taking laxatives for constipation. He also took a sleeping pill prior to the syncopal event in this very well may have contributed to the symptoms although most likely culprit was probably symptomatic anemia given the fact his hemoglobin was 7.3 at presentation.  Upon evaluation to the emergency department and he was quite febrile with a temperature of 104.2. No apparent constitutional symptoms nor meningismus. Cultures were obtained and the patient was started on broad-spectrum antibiotics. Chest x-ray was unremarkable.  After admission oncology was consulted after patient was found to have significant worsening of his thrombocytopenia. It was felt that the patient's thrombocytopenia was possibly due to TTP. He was eventually transfused packed red blood cells and platelets. He subsequently decompensated with  worsening fevers up to 105.4, tachypnea, crackles on exam and overall respiratory distress. He was subsequently transferred to the ICU. Because of fevers of uncertain etiology infectious disease service was consulted and patient was continued on broad-spectrum antibiotics.  In regards to the workup for the hemolytic anemia the DAT was negative and there was concern that said symptoms worsened after patient received transfusions that he may been experiencing a transfusion reaction. It was felt that the pancytopenia was primarily secondary to chemotherapy multiple myeloma and not TTP.  In the interim the patient's renal function has worsened. He endorses symptoms of nausea and vomiting likely related to azotemia. He continues to make urine. He was otherwise stable though and was transferred out of the ICU.  Assessment/Plan: Active Problems:   Multiple myeloma -On salvage chemotherapy w Calfizomib plus dexamethasone -Today oncology recommends starting IVIG 400 mg per kilogram per day x3 days-this also will be effective regarding the hemolysis -Holding chemotherapy at present -Recommend continuing steroids for a total of 3 days    Fever -Etiology still unclear -No recent fever since admission and is not neutropenic -ID recommends if fevers return consider CT scan of chest abdomen and pelvis -Cultures have been negative so far the recommendation is to continue broad-spectrum antibiotics -Consideration given to multiple myeloma fever    Acute renal failure -Progressive since admission and now with mild metabolic acidosis -Nephrology consulted and feels etiology secondary to ischemic ATN in setting of recent hypotension and syncope with multiple mild low since her chemotherapy less likely culprits -Pursuing renal ultrasound and has discontinued daily potassium -Nephrology did discuss with patient that he may require temporary hemodialysis but anticipate spontaneous recovery of renal function     Acute anemia with  hemolysis-non immune -IVIG as noted above -Etiology suspected to be related to transfusion reaction so avoid blood products if possible -Has received 3 units of packed red blood cells since admission    Syncope/Dehydration -Factorial syncope related to dehydration in setting of presyncopal episode ingestion of sleep aid -May also have had a vasovagal component since occurred when patient went to the bathroom noting patient does have enlarged prostate and could have had micturition syncope -Follow closely -No arrhythmias have been noted on telemetry since admission    Nausea & vomiting -Suspect related to azotemia from acute renal failure -Symptom management    Pancytopenia due to chemotherapy/MM -Stable -Continue steroids x3 more days as outlined by oncology    HYPERTENSION, MILD -Blood pressure well controlled and overall has been soft since admission -At home was on Tenoretic which currently is on hold    BENIGN PROSTATIC HYPERTROPHY -Likely explains bleeding after catheter placement-hematuria has resolved    Elevated troponin -Mild elevation without ischemic changes or chest pain and likely related to demand ischemia   DVT prophylaxis: SCDs Code Status: Full Family Communication: No family at bedside Disposition Plan/Expected LOS: step down   Consultants: Oncology Infectious disease Nephrology   Procedures: 2-D echocardiogram - Left ventricle: The cavity size was normal. Systolic function was normal. The estimated ejection fraction was in the range of 55% to 60%. Wall motion was normal; there were no regional wall motion abnormalities. - Mitral valve: Mild regurgitation.   Antibiotics: Acyclovir 3/24 >>> Tamiflu 3/24 >>> 3/25 Zosyn 3/24 >>> Vancomycin 3/24 >>>  HPI/Subjective: Patient alert. Primarily complaining of nausea and occasional emesis with attempts to heat. States has anorexia and overall poor appetite. Denies chest pain or  shortness of breath.  Objective: Blood pressure 121/64, pulse 56, temperature 98 F (36.7 C), temperature source Oral, resp. rate 20, height 5' 9"  (1.753 m), weight 148 lb 9.4 oz (67.4 kg), SpO2 100.00%.  Intake/Output Summary (Last 24 hours) at 11/12/13 1410 Last data filed at 11/12/13 1143  Gross per 24 hour  Intake   1948 ml  Output   1275 ml  Net    673 ml     Exam: General: No acute respiratory distress Lungs: Clear to auscultation bilaterally without wheezes or crackles, RA Cardiovascular: Regular bradycardic rate and rhythm without murmur gallop or rub normal S1 and S2, no peripheral edema or JVD Abdomen: Nontender, nondistended, soft, bowel sounds positive, no rebound, no ascites, no appreciable mass Musculoskeletal: No significant cyanosis, clubbing of bilateral lower extremities Neurological: Alert and oriented x 3, moves all extremities x 4 without focal neurological deficits, CN 2-12 intact  Scheduled Meds:  Scheduled Meds: . acyclovir  400 mg Oral BID  . allopurinol  100 mg Oral Daily  . aspirin EC  325 mg Oral Daily  . brimonidine  1 drop Both Eyes BID  . docusate sodium  100 mg Oral BID  . IMMUNE GLOBULIN 10% (HUMAN) IV - For Fluid Restriction Only  400 mg/kg Intravenous Q24H  . latanoprost  1 drop Both Eyes QHS  . methylPREDNISolone (SOLU-MEDROL) injection  125 mg Intravenous 3 times per day  . pantoprazole  40 mg Oral Daily  . piperacillin-tazobactam (ZOSYN)  IV  2.25 g Intravenous 3 times per day  . sodium chloride  3 mL Intravenous Q12H  . timolol  1 drop Both Eyes BID  . [START ON 11/13/2013] vancomycin  1,000 mg Intravenous Q48H   Continuous Infusions: . sodium chloride 100 mL/hr (11/12/13 1036)  Data Reviewed: Basic Metabolic Panel:  Recent Labs Lab 11/08/13 1300 11/09/13 2318 11/10/13 0341 11/11/13 0221 11/12/13 0107  NA 140 129* 133* 133* 132*  K 3.8 3.4* 3.8 3.6* 3.4*  CL 100 94* 94* 93* 95*  CO2 25  --  20 19 18*  GLUCOSE 100* 118*  105* 158* 157*  BUN 15 19 20  36* 56*  CREATININE 1.10 1.40* 1.31 2.38* 3.59*  CALCIUM 8.1*  --  8.2* 7.7* 6.9*  MG  --   --  2.0  --   --   PHOS  --   --  2.5  --   --    Liver Function Tests:  Recent Labs Lab 11/09/13 2310 11/10/13 0341 11/11/13 0221  AST 42* 50* 110*  ALT 14 15 20   ALKPHOS 98 104 84  BILITOT 2.8* 3.7* 4.3*  PROT 5.9* 6.3 5.8*  ALBUMIN 3.5 3.7 3.2*   No results found for this basename: LIPASE, AMYLASE,  in the last 168 hours No results found for this basename: AMMONIA,  in the last 168 hours CBC:  Recent Labs Lab 11/06/13 1027 11/08/13 1300 11/09/13 2310 11/09/13 2318 11/10/13 0341 11/11/13 0221  WBC 2.6* 2.3* 2.4*  --  2.3* 2.6*  NEUTROABS 1.2* 1.4* 1.4*  --   --  1.6*  HGB 7.9* 8.1* 7.3* 7.1* 7.6* 7.2*  HCT 23.3* 23.4* 20.2* 21.0* 20.8* 20.4*  MCV 91.4 92.9 92.2  --  94.1 95.3  PLT 22* 21* 18*  --  19* 20*   Cardiac Enzymes:  Recent Labs Lab 11/09/13 2310  11/10/13 2013 11/10/13 2240 11/11/13 1003 11/11/13 1926 11/12/13 0102  CKTOTAL 75  --   --   --   --   --   --   TROPONINI  --   < > 0.40* 0.73* 0.55* 0.34* 0.33*  < > = values in this interval not displayed. BNP (last 3 results) No results found for this basename: PROBNP,  in the last 8760 hours CBG: No results found for this basename: GLUCAP,  in the last 168 hours  Recent Results (from the past 240 hour(s))  CULTURE, BLOOD (ROUTINE X 2)     Status: None   Collection Time    11/09/13 11:00 PM      Result Value Ref Range Status   Specimen Description BLOOD LEFT ARM   Final   Special Requests BOTTLES DRAWN AEROBIC AND ANAEROBIC 10CC EA   Final   Culture  Setup Time     Final   Value: 11/10/2013 03:34     Performed at [facility]   Culture     Final   Value:        BLOOD CULTURE RECEIVED NO GROWTH TO DATE CULTURE WILL BE HELD FOR 5 DAYS BEFORE ISSUING A FINAL NEGATIVE REPORT     Performed at [facility]   Report Status PENDING   Incomplete  CULTURE, BLOOD  (ROUTINE X 2)     Status: None   Collection Time    11/09/13 11:10 PM      Result Value Ref Range Status   Specimen Description BLOOD RIGHT ARM   Final   Special Requests BOTTLES DRAWN AEROBIC ONLY [facility]   Final   Culture  Setup Time     Final   Value: 11/10/2013 03:34     Performed at [facility]   Culture     Final   Value:        BLOOD CULTURE RECEIVED  NO GROWTH TO DATE CULTURE WILL BE HELD FOR 5 DAYS BEFORE ISSUING A FINAL NEGATIVE REPORT     Performed at [facility]   Report Status PENDING   Incomplete  URINE CULTURE     Status: None   Collection Time    11/10/13 10:13 PM      Result Value Ref Range Status   Specimen Description URINE, CATHETERIZED   Final   Special Requests Immunocompromised   Final   Culture  Setup Time     Final   Value: 11/11/2013 00:40     Performed at [place]     Final   Value: NO GROWTH     Performed at [facility]   Culture     Final   Value: NO GROWTH     Performed at [facility]   Report Status 11/12/2013 FINAL   Final     Studies:  Recent x-ray studies have been reviewed in detail by the Attending Physician  Time spent :     Erin Hearing, [facility] Office  [phone] Pager [phone]  **If unable to reach the above provider after paging please contact the [facility] @ [phone]  On-Call/Text Page:      [email or web address]      password [email or web address]  If 7PM-7AM, please contact night-coverage [email or web address] Password [email or web address] 11/12/2013, 2:10 PM   LOS: 3 days   I have examined the patient, reviewed the chart and modified the above note which I agree with.   Percy Comp,MD Pager # on [email or web address] 11/12/2013, 6:29 PM

## 2013-11-13 LAB — PHOSPHORUS: Phosphorus: 4.3 mg/dL (ref 2.3–4.6)

## 2013-11-13 LAB — COMPREHENSIVE METABOLIC PANEL
ALT: 14 U/L (ref 0–53)
AST: 25 U/L (ref 0–37)
Albumin: 2.6 g/dL — ABNORMAL LOW (ref 3.5–5.2)
Alkaline Phosphatase: 79 U/L (ref 39–117)
BUN: 60 mg/dL — ABNORMAL HIGH (ref 6–23)
CO2: 16 mEq/L — ABNORMAL LOW (ref 19–32)
Calcium: 6.1 mg/dL — CL (ref 8.4–10.5)
Chloride: 99 mEq/L (ref 96–112)
Creatinine, Ser: 3.88 mg/dL — ABNORMAL HIGH (ref 0.50–1.35)
GFR calc non Af Amer: 14 mL/min — ABNORMAL LOW (ref >90)
GFR, EST AFRICAN AMERICAN: 16 mL/min — AB (ref >90)
Glucose, Bld: 134 mg/dL — ABNORMAL HIGH (ref 70–99)
POTASSIUM: 3.3 meq/L — AB (ref 3.7–5.3)
SODIUM: 133 meq/L — AB (ref 137–147)
TOTAL PROTEIN: 5.6 g/dL — AB (ref 6.0–8.3)
Total Bilirubin: 0.7 mg/dL (ref 0.3–1.2)

## 2013-11-13 LAB — CBC
HCT: 15.3 % — ABNORMAL LOW (ref 39.0–52.0)
Hemoglobin: 5.4 g/dL — CL (ref 13.0–17.0)
MCH: 33.8 pg (ref 26.0–34.0)
MCHC: 35.3 g/dL (ref 30.0–36.0)
MCV: 95.6 fL (ref 78.0–100.0)
Platelets: 39 10*3/uL — ABNORMAL LOW (ref 150–400)
RBC: 1.6 MIL/uL — AB (ref 4.22–5.81)
RDW: 22.1 % — ABNORMAL HIGH (ref 11.5–15.5)
WBC: 1.7 10*3/uL — ABNORMAL LOW (ref 4.0–10.5)

## 2013-11-13 LAB — PREPARE RBC (CROSSMATCH)

## 2013-11-13 LAB — LACTATE DEHYDROGENASE: LDH: 921 U/L — ABNORMAL HIGH (ref 94–250)

## 2013-11-13 MED ORDER — ACETAMINOPHEN 325 MG PO TABS
650.0000 mg | ORAL_TABLET | Freq: Once | ORAL | Status: AC
  Administered 2013-11-13: 650 mg via ORAL
  Filled 2013-11-13: qty 2

## 2013-11-13 MED ORDER — SODIUM BICARBONATE 8.4 % IV SOLN
INTRAVENOUS | Status: DC
  Administered 2013-11-13 – 2013-11-14 (×3): via INTRAVENOUS
  Filled 2013-11-13 (×4): qty 150

## 2013-11-13 MED ORDER — POTASSIUM CHLORIDE 10 MEQ/100ML IV SOLN
10.0000 meq | INTRAVENOUS | Status: AC
  Administered 2013-11-13 (×3): 10 meq via INTRAVENOUS
  Filled 2013-11-13 (×2): qty 100

## 2013-11-13 MED ORDER — DIPHENHYDRAMINE HCL 25 MG PO CAPS
25.0000 mg | ORAL_CAPSULE | Freq: Once | ORAL | Status: AC
  Administered 2013-11-13: 25 mg via ORAL
  Filled 2013-11-13: qty 1

## 2013-11-13 MED ORDER — CALCIUM GLUCONATE 500 MG PO TABS
1.0000 | ORAL_TABLET | Freq: Three times a day (TID) | ORAL | Status: AC
  Administered 2013-11-13 – 2013-11-14 (×3): 500 mg via ORAL
  Filled 2013-11-13 (×4): qty 1

## 2013-11-13 NOTE — Progress Notes (Signed)
Subjective:  Good UOP- creatinine went up but rate of rise is less- he looks good- renal ultrasound neg for obstruction- slight increased echogenicity Objective Vital signs in last 24 hours: Filed Vitals:   11/13/13 0400 11/13/13 0500 11/13/13 0600 11/13/13 0800  BP: 99/50 106/54 109/57   Pulse: 57 62 65   Temp:    97.9 F (36.6 C)  TempSrc:    Oral  Resp: _0 Height:      Weight:      SpO2: 99% 99% 99%    Weight change:   Intake/Output Summary (Last 24 hours) at 11/13/13 0847 Last data filed at 11/13/13 0600  Gross per 24 hour  Intake   2253 ml  Output   1625 ml  Net    628 ml    Assessment/ Plan: Pt is a 73 y.o. yo male with hx myeloma/stem cell tx/chemo who was admitted on 11/09/2013 with febrile syndrome with hypotension /anemia Assessment/Plan: 1. Renal- AKI in the setting of severe hypotension- U/A c/w ATN- nonoliguric- hopefully rate of rise is less with creatinine and we will see it start to level off.  No indications for dialysis at this point 2. Metabolic acidosis- due to renal failure- will give bicarb in fluids  3. Anemia- multifactorial- per oncology 4. HTN/volume- will give IVF in the form of bicarb 5. Hypokalemia- agree with repletion 6. Hyponatremia- should improve with fluids   Yusif Gnau A    Labs: Basic Metabolic Panel:  Recent Labs Lab 11/10/13 0341 11/11/13 0221 11/12/13 0107 11/13/13 0431  NA 133* 133* 132* 133*  K 3.8 3.6* 3.4* 3.3*  CL 94* 93* 95* 99  CO2 20 19 18* 16*  GLUCOSE 105* 158* 157* 134*  BUN 20 36* 56* 60*  CREATININE 1.31 2.38* 3.59* 3.88*  CALCIUM 8.2* 7.7* 6.9* 6.1*  PHOS 2.5  --   --  4.3   Liver Function Tests:  Recent Labs Lab 11/10/13 0341 11/11/13 0221 11/13/13 0431  AST 50* 110* 25  ALT _1 ALKPHOS 104 84 79  BILITOT 3.7* 4.3* 0.7  PROT 6.3 5.8* 5.6*  ALBUMIN 3.7 3.2* 2.6*   No results found for this basename: LIPASE, AMYLASE,  in the last 168 hours No results found for this  basename: AMMONIA,  in the last 168 hours CBC:  Recent Labs Lab 11/08/13 1300 11/09/13 2310  11/10/13 0341 11/11/13 0221 11/13/13 0431  WBC 2.3* 2.4*  --  2.3* 2.6* 1.7*  NEUTROABS 1.4* 1.4*  --   --  1.6*  --   HGB 8.1* 7.3*  < > 7.6* 7.2* 5.4*  HCT 23.4* 20.2*  < > 20.8* 20.4* 15.3*  MCV 92.9 92.2  --  94.1 95.3 95.6  PLT 21* 18*  --  19* 20* 39*  < > = values in this interval not displayed. Cardiac Enzymes:  Recent Labs Lab 11/09/13 2310  11/10/13 2013 11/10/13 2240 11/11/13 1003 11/11/13 1926 11/12/13 0102  CKTOTAL 75  --   --   --   --   --   --   TROPONINI  --   < > 0.40* 0.73* 0.55* 0.34* 0.33*  < > = values in this interval not displayed. CBG: No results found for this basename: GLUCAP,  in the last 168 hours  Iron Studies: No results found for this basename: IRON, TIBC, TRANSFERRIN, FERRITIN,  in the last 72 hours Studies/Results: US Renal  11/12/2013   CLINICAL DATA:  Acute renal failure.  Multiple myeloma.  EXAM: RENAL/URINARY TRACT ULTRASOUND COMPLETE  COMPARISON:  CT scan abdomen and pelvis dated 06/18/2012  FINDINGS: Right Kidney:  Length: 13.1 cm. Slight increased echogenicity of the renal parenchyma. Multiple renal cysts. The largest is 6.5 cm on the mid to lower pole. No hydronephrosis or solid mass.  Left Kidney:  Length: 13.2 cm. 5.5 cm cyst on the lateral aspect of the mid left kidney, minimally larger than on the prior CT scan. No hydronephrosis. Slight increased echogenicity of the renal parenchyma.  Bladder:  Foley catheter in place.  The bladder is empty.  IMPRESSION: Echogenic renal parenchyma consistent with renal medical disease. No obstruction. Bilateral renal cysts, stable.   Electronically Signed   By: Rozetta Nunnery M.D.   On: 11/12/2013 16:20   Medications: Infusions: . sodium chloride 100 mL/hr at 11/13/13 0400    Scheduled Medications: . acyclovir  400 mg Oral BID  . allopurinol  100 mg Oral Daily  . aspirin EC  325 mg Oral Daily  .  brimonidine  1 drop Both Eyes BID  . calcium gluconate  1 tablet Oral TID  . docusate sodium  100 mg Oral BID  . feeding supplement (RESOURCE BREEZE)  1 Container Oral QPC supper  . IMMUNE GLOBULIN 10% (HUMAN) IV - For Fluid Restriction Only  400 mg/kg Intravenous Q24H  . latanoprost  1 drop Both Eyes QHS  . methylPREDNISolone (SOLU-MEDROL) injection  125 mg Intravenous 3 times per day  . pantoprazole  40 mg Oral Daily  . piperacillin-tazobactam (ZOSYN)  IV  2.25 g Intravenous 3 times per day  . potassium chloride  10 mEq Intravenous Q1 Hr x 3  . sodium chloride  3 mL Intravenous Q12H  . timolol  1 drop Both Eyes BID  . vancomycin  1,000 mg Intravenous Q48H    have reviewed scheduled and prn medications.  Physical Exam: General: alert, does not look as bad as his issues Heart: RRR Lungs: mostly clear Abdomen: soft, non tender Extremities: no edema  Vanilla Heatherington A   11/13/2013,8:47 AM  LOS: 4 days

## 2013-11-13 NOTE — Progress Notes (Addendum)
[facility]  Trevor Michael OVA:[id number] DOB: 08/20/1941 DOA: 11/09/2013 PCP: Adella Hare, MD  Brief narrative: 73 year old male patient with multiple myeloma status post stem cell transplant January 2014 undergoing chemotherapy. Presented to the ER on 11/09/2013 with altered mentation. He had been seen in the ER during the previous 24 hours with complaints of weakness and dizziness. He was subsequently discharged home after an unremarkable workup. He was pancytopenic but this was stable and at baseline.  He returned to the ER after having a syncopal episode in the bathroom with subsequent altered mentation. EMS was called to the home and they found the patient to be very disoriented to time and situation but without focal neurological deficits. On arrival he was noted to have a rectal temperature of 104.2. Additional history meds at the patient has been experiencing severe anemia related to his ongoing chemotherapy and has required blood transfusions as recently as 2 weeks prior. He also is experiencing decreased oral intake and having diarrhea after taking laxatives for constipation. He also took a sleeping pill prior to the syncopal event in this very well may have contributed to the symptoms although most likely culprit was probably symptomatic anemia given the fact his hemoglobin was 7.3 at presentation.  Upon evaluation to the emergency department and he was quite febrile with a temperature of 104.2. No apparent constitutional symptoms nor meningismus. Cultures were obtained and the patient was started on broad-spectrum antibiotics. Chest x-ray was unremarkable.  After admission oncology was consulted after patient was found to have significant worsening of his thrombocytopenia. It was felt that the patient's thrombocytopenia was possibly due to TTP. He was eventually transfused packed red blood cells and platelets. He subsequently decompensated with  worsening fevers up to 105.4, tachypnea, crackles on exam and overall respiratory distress. He was subsequently transferred to the ICU. Because of fevers of uncertain etiology infectious disease service was consulted and patient was continued on broad-spectrum antibiotics.  In regards to the workup for the hemolytic anemia the DAT was negative and there was concern that said symptoms worsened after patient received transfusions that he may been experiencing a transfusion reaction. It was felt that the pancytopenia was primarily secondary to chemotherapy multiple myeloma and not TTP.  In the interim the patient's renal function has worsened. He endorses symptoms of nausea and vomiting likely related to azotemia. He continues to make urine. He was otherwise stable though and was transferred out of the ICU.  HPI/Subjective: Patient alert.Nausea has resolved. No emesis, CP or SOB  Assessment/Plan: Active Problems:   Multiple myeloma -On salvage chemotherapy w Calfizomib plus dexamethasone prior to admission -continue IVIG 400 mg per kilogram per day x3 days-this also will be effective regarding the hemolysis -Holding chemotherapy at present -Recommend continuing steroids for a total of 3 days    Fever -Etiology still unclear -No recent fever since admission and is not neutropenic -ID recommends if fevers return consider CT scan of chest abdomen and pelvis -Cultures have been negative so far the recommendation is to continue broad-spectrum antibiotics -Consideration given to multiple myeloma fever    Acute renal failure -Progressive since admission and now with metabolic acidosis -Nephrology consulted and feels etiology secondary to ischemic ATN in setting of recent hypotension and syncope with multiple myeloma and chemotherapy less likely culprits -Renal ultrasound: medical renal dz -Nephrology did discuss with patient that he may require temporary hemodialysis but anticipate spontaneous  recovery of renal function  Acute anemia with hemolysis-non immune -IVIG as noted above -Has received 3 units of packed red blood cells since admission but possibly had a reaction to this blood  -Hgb down to 5.4 today so oncology has ordered 2 units PRBCs today with xtra does of Prednisone-cont to monitor for transfusion rxn    Syncope/Dehydration -Factorial syncope related to dehydration in setting of presyncopal episode ingestion of sleep aid -May also have had a vasovagal component since occurred when patient went to the bathroom noting patient does have enlarged prostate and could have had micturition syncope -Follow closely -No arrhythmias have been noted on telemetry since admission    Nausea & vomiting -Suspect related to azotemia from acute renal failure-resolving- pt able to eat now -Symptom management    Pancytopenia due to chemotherapy/MM -Stable -Continue steroids x3 more days as outlined by oncology    HYPERTENSION, MILD -Blood pressure well controlled and overall has been soft since admission -At home was on Tenoretic which currently is on hold    BENIGN PROSTATIC HYPERTROPHY -Likely explains bleeding after catheter placement-hematuria has resolved    Elevated troponin -Mild elevation without ischemic changes or chest pain and likely related to demand ischemia   DVT prophylaxis: SCDs Code Status: Full Family Communication: No family at bedside Disposition Plan/Expected LOS: step down   Consultants: Oncology Infectious disease Nephrology   Procedures: 2-D echocardiogram - Left ventricle: The cavity size was normal. Systolic function was normal. The estimated ejection fraction was in the range of 55% to 60%. Wall motion was normal; there were no regional wall motion abnormalities. - Mitral valve: Mild regurgitation.   Antibiotics: Acyclovir 3/24 >>> Tamiflu 3/24 >>> 3/25 Zosyn 3/24 >>> Vancomycin 3/24 >>>   Objective: Blood pressure 119/60,  pulse 67, temperature 97.7 F (36.5 C), temperature source Oral, resp. rate 16, height _0  (1.753 m), weight 148 lb 9.4 oz (67.4 kg), SpO2 100.00%.  Intake/Output Summary (Last 24 hours) at 11/13/13 1258 Last data filed at 11/13/13 1200  Gross per 24 hour  Intake   2210 ml  Output   1825 ml  Net    385 ml     Exam: General: No acute respiratory distress Lungs: Clear to auscultation bilaterally without wheezes or crackles, RA Cardiovascular: Regular bradycardic rate and rhythm without murmur gallop or rub normal S1 and S2, no peripheral edema or JVD Abdomen: Nontender, nondistended, soft, bowel sounds positive, no rebound, no ascites, no appreciable mass Musculoskeletal: No significant cyanosis, clubbing of bilateral lower extremities Neurological: Alert and oriented x 3, moves all extremities x 4 without focal neurological deficits, CN 2-12 intact  Scheduled Meds:  Scheduled Meds: . acyclovir  400 mg Oral BID  . allopurinol  100 mg Oral Daily  . aspirin EC  325 mg Oral Daily  . brimonidine  1 drop Both Eyes BID  . calcium gluconate  1 tablet Oral TID  . docusate sodium  100 mg Oral BID  . feeding supplement (RESOURCE BREEZE)  1 Container Oral QPC supper  . IMMUNE GLOBULIN 10% (HUMAN) IV - For Fluid Restriction Only  400 mg/kg Intravenous Q24H  . latanoprost  1 drop Both Eyes QHS  . methylPREDNISolone (SOLU-MEDROL) injection  125 mg Intravenous 3 times per day  . pantoprazole  40 mg Oral Daily  . piperacillin-tazobactam (ZOSYN)  IV  2.25 g Intravenous 3 times per day  . sodium chloride  3 mL Intravenous Q12H  . timolol  1 drop Both Eyes BID   Continuous Infusions: .  sodium bicarbonate  infusion 1000 mL 75 mL/hr at 11/13/13 1044    Data Reviewed: Basic Metabolic Panel:  Recent Labs Lab 11/08/13 1300 11/09/13 2318 11/10/13 0341 11/11/13 0221 11/12/13 0107 11/13/13 0431  NA 140 129* 133* 133* 132* 133*  K 3.8 3.4* 3.8 3.6* 3.4* 3.3*  CL 100 94* 94* 93* 95* 99    CO2 25  --  20 19 18* 16*  GLUCOSE 100* 118* 105* 158* 157* 134*  BUN _0 36* 56* 60*  CREATININE 1.10 1.40* 1.31 2.38* 3.59* 3.88*  CALCIUM 8.1*  --  8.2* 7.7* 6.9* 6.1*  MG  --   --  2.0  --   --   --   PHOS  --   --  2.5  --   --  4.3   Liver Function Tests:  Recent Labs Lab 11/09/13 2310 11/10/13 0341 11/11/13 0221 11/13/13 0431  AST 42* 50* 110* 25  ALT _1 ALKPHOS 98 104 84 79  BILITOT 2.8* 3.7* 4.3* 0.7  PROT 5.9* 6.3 5.8* 5.6*  ALBUMIN 3.5 3.7 3.2* 2.6*   No results found for this basename: LIPASE, AMYLASE,  in the last 168 hours No results found for this basename: AMMONIA,  in the last 168 hours CBC:  Recent Labs Lab 11/08/13 1300 11/09/13 2310 11/09/13 2318 11/10/13 0341 11/11/13 0221 11/13/13 0431  WBC 2.3* 2.4*  --  2.3* 2.6* 1.7*  NEUTROABS 1.4* 1.4*  --   --  1.6*  --   HGB 8.1* 7.3* 7.1* 7.6* 7.2* 5.4*  HCT 23.4* 20.2* 21.0* 20.8* 20.4* 15.3*  MCV 92.9 92.2  --  94.1 95.3 95.6  PLT 21* 18*  --  19* 20* 39*   Cardiac Enzymes:  Recent Labs Lab 11/09/13 2310  11/10/13 2013 11/10/13 2240 11/11/13 1003 11/11/13 1926 11/12/13 0102  CKTOTAL 75  --   --   --   --   --   --   TROPONINI  --   < > 0.40* 0.73* 0.55* 0.34* 0.33*  < > = values in this interval not displayed. BNP (last 3 results) No results found for this basename: PROBNP,  in the last 8760 hours CBG: No results found for this basename: GLUCAP,  in the last 168 hours  Recent Results (from the past 240 hour(s))  CULTURE, BLOOD (ROUTINE X 2)     Status: None   Collection Time    11/09/13 11:00 PM      Result Value Ref Range Status   Specimen Description BLOOD LEFT ARM   Final   Special Requests BOTTLES DRAWN AEROBIC AND ANAEROBIC 10CC EA   Final   Culture  Setup Time     Final   Value: 11/10/2013 03:34     Performed at [facility]   Culture     Final   Value:        BLOOD CULTURE RECEIVED NO GROWTH TO DATE CULTURE WILL BE HELD FOR 5 DAYS BEFORE ISSUING A  FINAL NEGATIVE REPORT     Performed at [facility]   Report Status PENDING   Incomplete  CULTURE, BLOOD (ROUTINE X 2)     Status: None   Collection Time    11/09/13 11:10 PM      Result Value Ref Range Status   Specimen Description BLOOD RIGHT ARM   Final   Special Requests BOTTLES DRAWN AEROBIC ONLY [place]   Final   Culture  Setup Time  Final   Value: 11/10/2013 03:34     Performed at [facility]   Culture     Final   Value:        BLOOD CULTURE RECEIVED NO GROWTH TO DATE CULTURE WILL BE HELD FOR 5 DAYS BEFORE ISSUING A FINAL NEGATIVE REPORT     Performed at [facility]   Report Status PENDING   Incomplete  URINE CULTURE     Status: None   Collection Time    11/10/13 10:13 PM      Result Value Ref Range Status   Specimen Description URINE, CATHETERIZED   Final   Special Requests Immunocompromised   Final   Culture  Setup Time     Final   Value: 11/11/2013 00:40     Performed at [place]     Final   Value: NO GROWTH     Performed at [facility]   Culture     Final   Value: NO GROWTH     Performed at [facility]   Report Status 11/12/2013 FINAL   Final     Studies:  Recent x-ray studies have been reviewed in detail by the Attending Physician  Time spent :     Erin Hearing, [place] [facility] Office  [phone] Pager [phone]  **If unable to reach the above provider after paging please contact the [place] @ [phone]  On-Call/Text Page:      [email or web address]      password [email or web address]  If 7PM-7AM, please contact night-coverage [email or web address] Password [email or web address] 11/13/2013, 12:58 PM   LOS: 4 days   I have examined the patient, reviewed the chart and modified the above note which I agree with.   Olivianna Higley,MD Pager # [email or web address] 11/13/2013, 2:48 PM

## 2013-11-13 NOTE — Progress Notes (Signed)
[place] for Infectious Disease  Date of Admission:  11/09/2013  Antibiotics: Vancomycin day 4 Zosyn day 4  Subjective: No complaints, afebrile since admission  Objective: Temp:  [97.5 F (36.4 C)-98 F (36.7 C)] 97.5 F (36.4 C) (03/27 1130) Pulse Rate:  [57-77] 65 (03/27 0600) Resp:  [9-22] 19 (03/27 1130) BP: (96-128)/(46-61) 128/59 mmHg (03/27 1130) SpO2:  [97 %-100 %] 100 % (03/27 1130) Weight:  [148 lb 9.4 oz (67.4 kg)] 148 lb 9.4 oz (67.4 kg) (03/27 0349)  General: awake, up in chair Skin: no rashes Lungs: CTA B Cor: RRR wihtout m Abdomen: soft, nt, nd, +normoactive bs Ext: no edema  Lab Results Lab Results  Component Value Date   WBC 1.7* 11/13/2013   HGB 5.4* 11/13/2013   HCT 15.3* 11/13/2013   MCV 95.6 11/13/2013   PLT 39* 11/13/2013    Lab Results  Component Value Date   CREATININE 3.88* 11/13/2013   BUN 60* 11/13/2013   NA 133* 11/13/2013   K 3.3* 11/13/2013   CL 99 11/13/2013   CO2 16* 11/13/2013    Lab Results  Component Value Date   ALT 14 11/13/2013   AST 25 11/13/2013   ALKPHOS 79 11/13/2013   BILITOT 0.7 11/13/2013      Microbiology: Recent Results (from the past 240 hour(s))  CULTURE, BLOOD (ROUTINE X 2)     Status: None   Collection Time    11/09/13 11:00 PM      Result Value Ref Range Status   Specimen Description BLOOD LEFT ARM   Final   Special Requests BOTTLES DRAWN AEROBIC AND ANAEROBIC 10CC EA   Final   Culture  Setup Time     Final   Value: 11/10/2013 03:34     Performed at [facility]   Culture     Final   Value:        BLOOD CULTURE RECEIVED NO GROWTH TO DATE CULTURE WILL BE HELD FOR 5 DAYS BEFORE ISSUING A FINAL NEGATIVE REPORT     Performed at [facility]   Report Status PENDING   Incomplete  CULTURE, BLOOD (ROUTINE X 2)     Status: None   Collection Time    11/09/13 11:10 PM      Result Value Ref Range Status   Specimen Description BLOOD RIGHT ARM   Final   Special Requests BOTTLES DRAWN AEROBIC  ONLY [place]   Final   Culture  Setup Time     Final   Value: 11/10/2013 03:34     Performed at [facility]   Culture     Final   Value:        BLOOD CULTURE RECEIVED NO GROWTH TO DATE CULTURE WILL BE HELD FOR 5 DAYS BEFORE ISSUING A FINAL NEGATIVE REPORT     Performed at [facility]   Report Status PENDING   Incomplete  URINE CULTURE     Status: None   Collection Time    11/10/13 10:13 PM      Result Value Ref Range Status   Specimen Description URINE, CATHETERIZED   Final   Special Requests Immunocompromised   Final   Culture  Setup Time     Final   Value: 11/11/2013 00:40     Performed at [facility]     Final   Value: NO GROWTH     Performed at [facility]  Final   Value: NO GROWTH     Performed at [facility]   Report Status 11/12/2013 FINAL   Final    Studies/Results: US Renal  11/12/2013   CLINICAL DATA:  Acute renal failure.  Multiple myeloma.  EXAM: RENAL/URINARY TRACT ULTRASOUND COMPLETE  COMPARISON:  CT scan abdomen and pelvis dated 06/18/2012  FINDINGS: Right Kidney:  Length: 13.1 cm. Slight increased echogenicity of the renal parenchyma. Multiple renal cysts. The largest is 6.5 cm on the mid to lower pole. No hydronephrosis or solid mass.  Left Kidney:  Length: 13.2 cm. 5.5 cm cyst on the lateral aspect of the mid left kidney, minimally larger than on the prior CT scan. No hydronephrosis. Slight increased echogenicity of the renal parenchyma.  Bladder:  Foley catheter in place.  The bladder is empty.  IMPRESSION: Echogenic renal parenchyma consistent with renal medical disease. No obstruction. Bilateral renal cysts, stable.   Electronically Signed   By: Rozetta Nunnery M.D.   On: 11/12/2013 16:20    Assessment/Plan: 1)  Fever - unknown etiology and now resolved.  [place] over 1500, though not checked today, not considered neutropenic. Renal ultrasound without abscess.  Possibly MM fever.  Urine culture, blood  culture all negative.  I will d/c zosyn and vancomycin and observe off of antibiotics.   - his vancomycin will be present for another 2-3 days since he got it this am -if fever returns (other than fever associated with transfusion), consider CT scan C/A/P.    Dr. Johnnye Sima will be available over the weekend if needed.  thanks   Scharlene Gloss, [place] [place] [email or web address] [phone] pager   [phone] cell 11/13/2013, 11:45 AM

## 2013-11-13 NOTE — Progress Notes (Signed)
Hematology  We will transfuse two units of packed RBCs now.  We will instruct the nurse to give steroids prior to receipt of blood along with benadryl and tylenol.

## 2013-11-14 DIAGNOSIS — D72819 Decreased white blood cell count, unspecified: Secondary | ICD-10-CM

## 2013-11-14 DIAGNOSIS — D638 Anemia in other chronic diseases classified elsewhere: Secondary | ICD-10-CM

## 2013-11-14 DIAGNOSIS — D709 Neutropenia, unspecified: Secondary | ICD-10-CM

## 2013-11-14 DIAGNOSIS — D696 Thrombocytopenia, unspecified: Secondary | ICD-10-CM

## 2013-11-14 LAB — TYPE AND SCREEN
ABO/RH(D): O POS
Antibody Screen: POSITIVE
DAT, IgG: NEGATIVE
DONOR AG TYPE: NEGATIVE
Donor AG Type: NEGATIVE
Donor AG Type: NEGATIVE
Unit division: 0
Unit division: 0
Unit division: 0
Unit division: 0

## 2013-11-14 LAB — HAPTOGLOBIN: Haptoglobin: <25 mg/dL — ABNORMAL LOW (ref 45–215)

## 2013-11-14 LAB — RENAL FUNCTION PANEL
Albumin: 2.6 g/dL — ABNORMAL LOW (ref 3.5–5.2)
BUN: 52 mg/dL — ABNORMAL HIGH (ref 6–23)
CALCIUM: 6.1 mg/dL — AB (ref 8.4–10.5)
CO2: 18 meq/L — AB (ref 19–32)
CREATININE: 3.82 mg/dL — AB (ref 0.50–1.35)
Chloride: 99 mEq/L (ref 96–112)
GFR calc Af Amer: 17 mL/min — ABNORMAL LOW (ref >90)
GFR calc non Af Amer: 14 mL/min — ABNORMAL LOW (ref >90)
GLUCOSE: 137 mg/dL — AB (ref 70–99)
PHOSPHORUS: 3 mg/dL (ref 2.3–4.6)
Potassium: 3.3 mEq/L — ABNORMAL LOW (ref 3.7–5.3)
Sodium: 135 mEq/L — ABNORMAL LOW (ref 137–147)

## 2013-11-14 LAB — CBC
HEMATOCRIT: 22.7 % — AB (ref 39.0–52.0)
HEMOGLOBIN: 8.1 g/dL — AB (ref 13.0–17.0)
MCH: 32.8 pg (ref 26.0–34.0)
MCHC: 35.7 g/dL (ref 30.0–36.0)
MCV: 91.9 fL (ref 78.0–100.0)
Platelets: 48 10*3/uL — ABNORMAL LOW (ref 150–400)
RBC: 2.47 MIL/uL — ABNORMAL LOW (ref 4.22–5.81)
RDW: 21.9 % — AB (ref 11.5–15.5)
WBC: 2.2 10*3/uL — ABNORMAL LOW (ref 4.0–10.5)

## 2013-11-14 LAB — LACTATE DEHYDROGENASE: LDH: 884 U/L — ABNORMAL HIGH (ref 94–250)

## 2013-11-14 MED ORDER — CALCIUM GLUCONATE 10 % IV SOLN
1.0000 g | Freq: Once | INTRAVENOUS | Status: AC
  Administered 2013-11-14: 1 g via INTRAVENOUS
  Filled 2013-11-14: qty 10

## 2013-11-14 MED ORDER — FOLIC ACID 1 MG PO TABS
1.0000 mg | ORAL_TABLET | Freq: Every day | ORAL | Status: DC
  Administered 2013-11-14 – 2013-11-17 (×4): 1 mg via ORAL
  Filled 2013-11-14 (×4): qty 1

## 2013-11-14 MED ORDER — POTASSIUM CHLORIDE CRYS ER 20 MEQ PO TBCR
40.0000 meq | EXTENDED_RELEASE_TABLET | ORAL | Status: AC
  Administered 2013-11-14 (×2): 40 meq via ORAL
  Filled 2013-11-14 (×2): qty 2

## 2013-11-14 MED ORDER — ACYCLOVIR 400 MG PO TABS
400.0000 mg | ORAL_TABLET | Freq: Every day | ORAL | Status: DC
  Administered 2013-11-15 – 2013-11-17 (×3): 400 mg via ORAL
  Filled 2013-11-14 (×3): qty 1

## 2013-11-14 NOTE — Progress Notes (Signed)
Subjective:  Good UOP- creatinine stable- he looks good-  Objective Vital signs in last 24 hours: Filed Vitals:   11/13/13 2300 11/13/13 2346 11/14/13 0442 11/14/13 0600  BP: 117/58 117/65 117/55 124/64  Pulse: 63 80 57 59  Temp:  97.4 F (36.3 C) 97.9 F (36.6 C)   TempSrc:  Oral Axillary   Resp: 8 19 11 13   Height:      Weight:      SpO2: 98% 96% 98% 98%   Weight change:   Intake/Output Summary (Last 24 hours) at 11/14/13 0730 Last data filed at 11/14/13 0600  Gross per 24 hour  Intake   2350 ml  Output   1800 ml  Net    550 ml    Assessment/ Plan: Pt is a 73 y.o. yo male with hx myeloma/stem cell tx/chemo who was admitted on 11/09/2013 with febrile syndrome with hypotension /anemia Assessment/Plan: 1. Renal- AKI in the setting of severe hypotension- U/A c/w ATN-U/S unrevealing-  nonoliguric- hopefully seeing plateau of creatinine and we will see it start to improve.  No indications for dialysis at this point 2. Metabolic acidosis- due to renal failure- giving bicarb in fluids  3. Anemia- multifactorial- per oncology- s/p transfusion  4. HTN/volume- will give IVF in the form of bicarb- not showing signs of overload just yet  5. Hypokalemia- agree with repletion- 40 daily no change 6. Hyponatremia- improving with fluids   Taylin Mans A    Labs: Basic Metabolic Panel:  Recent Labs Lab 11/10/13 0341  11/12/13 0107 11/13/13 0431 11/14/13 0306  NA 133*  < > 132* 133* 135*  K 3.8  < > 3.4* 3.3* 3.3*  CL 94*  < > 95* 99 99  CO2 20  < > 18* 16* 18*  GLUCOSE 105*  < > 157* 134* 137*  BUN 20  < > 56* 60* 52*  CREATININE 1.31  < > 3.59* 3.88* 3.82*  CALCIUM 8.2*  < > 6.9* 6.1* 6.1*  PHOS 2.5  --   --  4.3 3.0  < > = values in this interval not displayed. Liver Function Tests:  Recent Labs Lab 11/10/13 0341 11/11/13 0221 11/13/13 0431 11/14/13 0306  AST 50* 110* 25  --   ALT 15 20 14   --   ALKPHOS 104 84 79  --   BILITOT 3.7* 4.3* 0.7  --   PROT 6.3  5.8* 5.6*  --   ALBUMIN 3.7 3.2* 2.6* 2.6*   No results found for this basename: LIPASE, AMYLASE,  in the last 168 hours No results found for this basename: AMMONIA,  in the last 168 hours CBC:  Recent Labs Lab 11/08/13 1300 11/09/13 2310  11/10/13 0341 11/11/13 0221 11/13/13 0431 11/14/13 0306  WBC 2.3* 2.4*  --  2.3* 2.6* 1.7* 2.2*  NEUTROABS 1.4* 1.4*  --   --  1.6*  --   --   HGB 8.1* 7.3*  < > 7.6* 7.2* 5.4* 8.1*  HCT 23.4* 20.2*  < > 20.8* 20.4* 15.3* 22.7*  MCV 92.9 92.2  --  94.1 95.3 95.6 91.9  PLT 21* 18*  --  19* 20* 39* 48*  < > = values in this interval not displayed. Cardiac Enzymes:  Recent Labs Lab 11/09/13 2310  11/10/13 2013 11/10/13 2240 11/11/13 1003 11/11/13 1926 11/12/13 0102  CKTOTAL 75  --   --   --   --   --   --   TROPONINI  --   < >  0.40* 0.73* 0.55* 0.34* 0.33*  < > = values in this interval not displayed. CBG: No results found for this basename: GLUCAP,  in the last 168 hours  Iron Studies: No results found for this basename: IRON, TIBC, TRANSFERRIN, FERRITIN,  in the last 72 hours Studies/Results: US Renal  11/12/2013   CLINICAL DATA:  Acute renal failure.  Multiple myeloma.  EXAM: RENAL/URINARY TRACT ULTRASOUND COMPLETE  COMPARISON:  CT scan abdomen and pelvis dated 06/18/2012  FINDINGS: Right Kidney:  Length: 13.1 cm. Slight increased echogenicity of the renal parenchyma. Multiple renal cysts. The largest is 6.5 cm on the mid to lower pole. No hydronephrosis or solid mass.  Left Kidney:  Length: 13.2 cm. 5.5 cm cyst on the lateral aspect of the mid left kidney, minimally larger than on the prior CT scan. No hydronephrosis. Slight increased echogenicity of the renal parenchyma.  Bladder:  Foley catheter in place.  The bladder is empty.  IMPRESSION: Echogenic renal parenchyma consistent with renal medical disease. No obstruction. Bilateral renal cysts, stable.   Electronically Signed   By: Rozetta Nunnery M.D.   On: 11/12/2013 16:20    Medications: Infusions: .  sodium bicarbonate  infusion 1000 mL 75 mL/hr at 11/14/13 4098    Scheduled Medications: . acyclovir  400 mg Oral BID  . allopurinol  100 mg Oral Daily  . aspirin EC  325 mg Oral Daily  . brimonidine  1 drop Both Eyes BID  . calcium gluconate  1 g Intravenous Once  . docusate sodium  100 mg Oral BID  . feeding supplement (RESOURCE BREEZE)  1 Container Oral QPC supper  . latanoprost  1 drop Both Eyes QHS  . methylPREDNISolone (SOLU-MEDROL) injection  125 mg Intravenous 3 times per day  . pantoprazole  40 mg Oral Daily  . sodium chloride  3 mL Intravenous Q12H  . timolol  1 drop Both Eyes BID    have reviewed scheduled and prn medications.  Physical Exam: General: alert, does not look as bad as his issues Heart: RRR Lungs: mostly clear Abdomen: soft, non tender Extremities: no edema  Chaynce Schafer A   11/14/2013,7:30 AM  LOS: 5 days

## 2013-11-14 NOTE — Progress Notes (Signed)
Trevor Michael   DOB:[birth year]   YS#:[id number]   Dr. Heath Lark covering Dr. Juliann Mule from 11/14/13 to 11/20/13 Subjective: The patient is feeling well. He is mobilizing around the unit without complications. Denies any chest pain, shortness of breath or dizziness. He started to eat well and able to hydrate himself. He is producing good amount of urine. The patient denies any recent signs or symptoms of bleeding such as spontaneous epistaxis, hematuria or hematochezia. He denies any recent fever, chills, night sweats or cough  Objective:  Filed Vitals:   11/14/13 1136  BP: 149/76  Pulse: 70  Temp: 98.2 F (36.8 C)  Resp:      Intake/Output Summary (Last 24 hours) at 11/14/13 1416 Last data filed at 11/14/13 1200  Gross per 24 hour  Intake   2128 ml  Output   1200 ml  Net    928 ml    GENERAL:alert, no distress and comfortable SKIN: skin color, texture, turgor are normal, no rashes or significant lesions EYES: normal, Conjunctiva are pale and non-injected, sclera clear OROPHARYNX:no exudate, no erythema and lips, buccal mucosa, and tongue normal  NECK: supple, thyroid normal size, non-tender, without nodularity LYMPH:  no palpable lymphadenopathy in the cervical, axillary or inguinal LUNGS: clear to auscultation and percussion with normal breathing effort HEART: regular rate & rhythm and no murmurs and no lower extremity edema ABDOMEN:abdomen soft, non-tender and normal bowel sounds Musculoskeletal:no cyanosis of digits and no clubbing  NEURO: alert & oriented x 3 with fluent speech, no focal motor/sensory deficits   Labs:  Lab Results  Component Value Date   WBC 2.2* 11/14/2013   HGB 8.1* 11/14/2013   HCT 22.7* 11/14/2013   MCV 91.9 11/14/2013   PLT 48* 11/14/2013   NEUTROABS 1.6* 11/11/2013    Lab Results  Component Value Date   NA 135* 11/14/2013   K 3.3* 11/14/2013   CL 99 11/14/2013   CO2 18* 11/14/2013    Studies:  US Renal  11/12/2013   CLINICAL DATA:  Acute renal  failure.  Multiple myeloma.  EXAM: RENAL/URINARY TRACT ULTRASOUND COMPLETE  COMPARISON:  CT scan abdomen and pelvis dated 06/18/2012  FINDINGS: Right Kidney:  Length: 13.1 cm. Slight increased echogenicity of the renal parenchyma. Multiple renal cysts. The largest is 6.5 cm on the mid to lower pole. No hydronephrosis or solid mass.  Left Kidney:  Length: 13.2 cm. 5.5 cm cyst on the lateral aspect of the mid left kidney, minimally larger than on the prior CT scan. No hydronephrosis. Slight increased echogenicity of the renal parenchyma.  Bladder:  Foley catheter in place.  The bladder is empty.  IMPRESSION: Echogenic renal parenchyma consistent with renal medical disease. No obstruction. Bilateral renal cysts, stable.   Electronically Signed   By: Rozetta Nunnery M.D.   On: 11/12/2013 16:20    Assessment & Plan:  #1 multiple myeloma His last set of labs from 09/01/2013 showed persistent M spike of 1.6 g, IgA lambda. Due to recent acute events with severe pancytopenia, his chemotherapy is placed on hold. I recommend to continue holding off treatment and continue to provide supportive care. #2 leukopenia The patient is mildly neutropenic. Currently, he is not septic. He is currently covered with broad-spectrum intravenous antibiotics.  There is no role for G-CSF. Continue supportive care. 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 His last set of labs suggested that his hemolysis is improving.  The patient has received 3 days of Corticosteroid therapy along with IVIG. He has been transfused recently.  Today, he has no symptoms of anemia. I recommend continue observation. I will order daily reticulocyte count, LDH tomorrow as well as bilirubin on 11/16/2013. I will start him on folic acid supplements. I recommend we established transfusion threshold to transfuse him if his hemoglobin is less than 7 g. #4  thrombocytopenia This is improving. Cause is unknown, likely multifactorial, could be related to recent infection and related to side effects of medication. The patient is on IV vancomycin. He has received 3 days of IVIG and corticosteroid therapy. His platelet count is improving. I recommend continue observation. I recommend we established transfusion threshold to transfuse him with platelets if he is platelet count dropped to less than 10,000 or the patient had signs of active bleeding. If he platelet count starts to drop to less than 20,000, I recommend discontinuation of aspirin. #5 acute renal failure Cause is unknown, could be related to recent sepsis. He is following with nephrologist. The patient is drinking a lot of oral liquids. I continue to encourage him to drink a lot of fluids. I recommend consider removing his Foley catheter. #6 hypocalcemia The patient is receiving replacement therapy.  Overall, I am very encouraged to see that the patient is improving clinically. I recommend transfer him to a regular floor, and if he continues to improve over the weekend and the next few days, the patient may be discharged home end of next week.   [facility], [place], MD 11/14/2013  2:16 PM

## 2013-11-14 NOTE — Progress Notes (Signed)
CRITICAL VALUE ALERT  Critical value received:  Ca-6.1  Date of notification:  11/14/13  Time of notification:  0400  Critical value read back: yes  Nurse who received alert:  M.Forest Gleason, RN  MD notified (1st page):  Fredirick Maudlin  Time of first page:  0630  MD notified (2nd page):  Time of second page:  Responding MD:    Time MD responded:    IV Calcium ordered. Will monitor. M.Forest Gleason, RN

## 2013-11-14 NOTE — Progress Notes (Addendum)
[facility]  KAEVON COTTA CMK:[id number] DOB: 1941/04/08 DOA: 11/09/2013 PCP: Adella Hare, MD  Brief narrative: 73 year old male patient with multiple myeloma status post stem cell transplant January 2014 undergoing chemotherapy. Presented to the ER on 11/09/2013 with altered mentation. He had been seen in the ER during the previous 24 hours with complaints of weakness and dizziness. He was subsequently discharged home after an unremarkable workup. He was pancytopenic but this was stable and at baseline.  He returned to the ER after having a syncopal episode in the bathroom with subsequent altered mentation. EMS was called to the home and they found the patient to be very disoriented to time and situation but without focal neurological deficits. On arrival he was noted to have a rectal temperature of 104.2. Additional history meds at the patient has been experiencing severe anemia related to his ongoing chemotherapy and has required blood transfusions as recently as 2 weeks prior. He also is experiencing decreased oral intake and having diarrhea after taking laxatives for constipation. He also took a sleeping pill prior to the syncopal event in this very well may have contributed to the symptoms although most likely culprit was probably symptomatic anemia given the fact his hemoglobin was 7.3 at presentation.  Cultures were obtained and the patient was started on broad-spectrum antibiotics. Chest x-ray was unremarkable.  After admission oncology was consulted after patient was found to have significant worsening of his thrombocytopenia. It was felt that the patient's thrombocytopenia was possibly due to TTP. He was eventually transfused packed red blood cells and platelets. He subsequently decompensated with worsening fevers up to 105.4, tachypnea, crackles on exam and overall respiratory distress. He was subsequently transferred to the ICU. Because of fevers  of uncertain etiology infectious disease service was consulted and patient was continued on broad-spectrum antibiotics.  In regards to the workup for the hemolytic anemia the DAT was negative and there was concern that said symptoms worsened after patient received transfusions that he may been experiencing a transfusion reaction. It was felt that the pancytopenia was primarily secondary to chemotherapy multiple myeloma and not TTP.  In the interim the patient's renal function has worsened. He endorses symptoms of nausea and vomiting likely related to azotemia. He continues to make urine. He was otherwise stable though and was transferred out of the ICU.  HPI/Subjective: Patient alert. pleasant as always- no complaints.   Assessment/Plan: Active Problems:   Multiple myeloma -On salvage chemotherapy w Calfizomib plus dexamethasone prior to admission -continue IVIG 400 mg per kilogram per day x3 days- stopped today -Recommend continuing steroids for a total of 3 days- will d/c today -Holding chemotherapy at present - Dr Alvy Bimler states she will not see the pt tomorrow but come back on Monday- if there are any issues we can call her anytime.    Fever -Etiology still unclear but likely had an infectiondue to low IG G levels-- Oncology does not think fever came from the Myeloma- fevers resolved after given IV Ig -Cultures have been negative so far     Acute renal failure -Progressive since admission and now with metabolic acidosis -Nephrology consulted and feels etiology secondary to ischemic ATN in setting of recent hypotension and syncope with multiple myeloma and chemotherapy less likely culprits -Renal ultrasound: medical renal dz -Nephrology did discuss with patient that he may require temporary hemodialysis but anticipate spontaneous recovery of renal function    Acute anemia with hemolysis-non immune (due to  acute infection?) -IVIG as noted above -Initially received  units of packed red  blood cells - possibly had a reaction to this blood  -Hgb down to 5.4 on 3/27 so oncology has ordered 2 units PRBCs    Syncope/Dehydration -e related to dehydration in setting of ingestion of sleep aid and anemia -May also have had a vasovagal component since occurred when patient went to the bathroom noting patient does have enlarged prostate and could have had micturition syncope -Follow closely -No arrhythmias have been noted on telemetry since admission    Nausea & vomiting -Suspect related to azotemia from acute renal failure-resolving- pt able to eat now -Symptom management    Pancytopenia due to chemotherapy/MM -Stable    HYPERTENSION, MILD -Blood pressure well controlled and overall has been soft since admission -At home was on Tenoretic which currently is on hold    BENIGN PROSTATIC HYPERTROPHY -Likely explains bleeding after catheter placement-hematuria has resolved - Onc would like foley out    Elevated troponin -Mild elevation without ischemic changes or chest pain and likely related to demand ischemia   DVT prophylaxis: SCDs Code Status: Full Family Communication: No family at bedside Disposition Plan/Expected LOS: ok to transfer to floor per Onc   Consultants: Oncology Infectious disease Nephrology   Procedures: 2-D echocardiogram - Left ventricle: The cavity size was normal. Systolic function was normal. The estimated ejection fraction was in the range of 55% to 60%. Wall motion was normal; there were no regional wall motion abnormalities. - Mitral valve: Mild regurgitation.   Antibiotics: Acyclovir 3/24 >>> Tamiflu 3/24 >>> 3/25 Zosyn 3/24 >>> Vancomycin 3/24 >>>   Objective: Blood pressure 149/76, pulse 70, temperature 98.2 F (36.8 C), temperature source Oral, resp. rate 16, height 5' 9"  (1.753 m), weight 67.4 kg (148 lb 9.4 oz), SpO2 100.00%.  Intake/Output Summary (Last 24 hours) at 11/14/13 1420 Last data filed at 11/14/13 1200  Gross  per 24 hour  Intake   2128 ml  Output   1200 ml  Net    928 ml     Exam: General: No acute respiratory distress Lungs: Clear to auscultation bilaterally without wheezes or crackles, RA Cardiovascular: Regular bradycardic rate and rhythm without murmur gallop or rub normal S1 and S2, no peripheral edema or JVD Abdomen: Nontender, nondistended, soft, bowel sounds positive, no rebound, no ascites, no appreciable mass Musculoskeletal: No significant cyanosis, clubbing of bilateral lower extremities Neurological: Alert and oriented x 3, moves all extremities x 4 without focal neurological deficits, CN 2-12 intact  Scheduled Meds:  Scheduled Meds: . [START ON 11/15/2013] acyclovir  400 mg Oral Daily  . allopurinol  100 mg Oral Daily  . aspirin EC  325 mg Oral Daily  . brimonidine  1 drop Both Eyes BID  . docusate sodium  100 mg Oral BID  . feeding supplement (RESOURCE BREEZE)  1 Container Oral QPC supper  . folic acid  1 mg Oral Daily  . latanoprost  1 drop Both Eyes QHS  . methylPREDNISolone (SOLU-MEDROL) injection  125 mg Intravenous 3 times per day  . pantoprazole  40 mg Oral Daily  . sodium chloride  3 mL Intravenous Q12H  . timolol  1 drop Both Eyes BID   Continuous Infusions: .  sodium bicarbonate  infusion 1000 mL 75 mL/hr at 11/14/13 0915    Data Reviewed: Basic Metabolic Panel:  Recent Labs Lab 11/10/13 0341 11/11/13 0221 11/12/13 0107 11/13/13 0431 11/14/13 0306  NA 133* 133* 132* 133* 135*  K 3.8 3.6* 3.4* 3.3* 3.3*  CL 94* 93* 95* 99 99  CO2 20 19 18* 16* 18*  GLUCOSE 105* 158* 157* 134* 137*  BUN 20 36* 56* 60* 52*  CREATININE 1.31 2.38* 3.59* 3.88* 3.82*  CALCIUM 8.2* 7.7* 6.9* 6.1* 6.1*  MG 2.0  --   --   --   --   PHOS 2.5  --   --  4.3 3.0   Liver Function Tests:  Recent Labs Lab 11/09/13 2310 11/10/13 0341 11/11/13 0221 11/13/13 0431 11/14/13 0306  AST 42* 50* 110* 25  --   ALT 14 15 20 14   --   ALKPHOS 98 104 84 79  --   BILITOT 2.8* 3.7*  4.3* 0.7  --   PROT 5.9* 6.3 5.8* 5.6*  --   ALBUMIN 3.5 3.7 3.2* 2.6* 2.6*   No results found for this basename: LIPASE, AMYLASE,  in the last 168 hours No results found for this basename: AMMONIA,  in the last 168 hours CBC:  Recent Labs Lab 11/08/13 1300 11/09/13 2310 11/09/13 2318 11/10/13 0341 11/11/13 0221 11/13/13 0431 11/14/13 0306  WBC 2.3* 2.4*  --  2.3* 2.6* 1.7* 2.2*  NEUTROABS 1.4* 1.4*  --   --  1.6*  --   --   HGB 8.1* 7.3* 7.1* 7.6* 7.2* 5.4* 8.1*  HCT 23.4* 20.2* 21.0* 20.8* 20.4* 15.3* 22.7*  MCV 92.9 92.2  --  94.1 95.3 95.6 91.9  PLT 21* 18*  --  19* 20* 39* 48*   Cardiac Enzymes:  Recent Labs Lab 11/09/13 2310  11/10/13 2013 11/10/13 2240 11/11/13 1003 11/11/13 1926 11/12/13 0102  CKTOTAL 75  --   --   --   --   --   --   TROPONINI  --   < > 0.40* 0.73* 0.55* 0.34* 0.33*  < > = values in this interval not displayed. BNP (last 3 results) No results found for this basename: PROBNP,  in the last 8760 hours CBG: No results found for this basename: GLUCAP,  in the last 168 hours  Recent Results (from the past 240 hour(s))  CULTURE, BLOOD (ROUTINE X 2)     Status: None   Collection Time    11/09/13 11:00 PM      Result Value Ref Range Status   Specimen Description BLOOD LEFT ARM   Final   Special Requests BOTTLES DRAWN AEROBIC AND ANAEROBIC 10CC EA   Final   Culture  Setup Time     Final   Value: 11/10/2013 03:34     Performed at [facility]   Culture     Final   Value:        BLOOD CULTURE RECEIVED NO GROWTH TO DATE CULTURE WILL BE HELD FOR 5 DAYS BEFORE ISSUING A FINAL NEGATIVE REPORT     Performed at [facility]   Report Status PENDING   Incomplete  CULTURE, BLOOD (ROUTINE X 2)     Status: None   Collection Time    11/09/13 11:10 PM      Result Value Ref Range Status   Specimen Description BLOOD RIGHT ARM   Final   Special Requests BOTTLES DRAWN AEROBIC ONLY [facility]   Final   Culture  Setup Time     Final   Value:  11/10/2013 03:34     Performed at [facility]   Culture     Final   Value:  BLOOD CULTURE RECEIVED NO GROWTH TO DATE CULTURE WILL BE HELD FOR 5 DAYS BEFORE ISSUING A FINAL NEGATIVE REPORT     Performed at [facility]   Report Status PENDING   Incomplete  URINE CULTURE     Status: None   Collection Time    11/10/13 10:13 PM      Result Value Ref Range Status   Specimen Description URINE, CATHETERIZED   Final   Special Requests Immunocompromised   Final   Culture  Setup Time     Final   Value: 11/11/2013 00:40     Performed at [facility] Count     Final   Value: NO GROWTH     Performed at [facility]   Culture     Final   Value: NO GROWTH     Performed at [facility]   Report Status 11/12/2013 FINAL   Final     Studies:  Recent x-ray studies have been reviewed in detail by the Attending Physician  Time spent : 30 min  Javon Hupfer,MD Pager # on [email or web address] 11/14/2013, 2:20 PM

## 2013-11-15 LAB — CBC WITH DIFFERENTIAL/PLATELET
BASOS ABS: 0 10*3/uL (ref 0.0–0.1)
Basophils Relative: 0 % (ref 0–1)
Eosinophils Absolute: 0 10*3/uL (ref 0.0–0.7)
Eosinophils Relative: 0 % (ref 0–5)
HEMATOCRIT: 24.5 % — AB (ref 39.0–52.0)
Hemoglobin: 8.7 g/dL — ABNORMAL LOW (ref 13.0–17.0)
LYMPHS ABS: 0.5 10*3/uL — AB (ref 0.7–4.0)
Lymphocytes Relative: 19 % (ref 12–46)
MCH: 33.2 pg (ref 26.0–34.0)
MCHC: 35.5 g/dL (ref 30.0–36.0)
MCV: 93.5 fL (ref 78.0–100.0)
MONO ABS: 0.5 10*3/uL (ref 0.1–1.0)
MONOS PCT: 18 % — AB (ref 3–12)
NEUTROS ABS: 1.8 10*3/uL (ref 1.7–7.7)
Neutrophils Relative %: 63 % (ref 43–77)
Platelets: 62 10*3/uL — ABNORMAL LOW (ref 150–400)
RBC: 2.62 MIL/uL — ABNORMAL LOW (ref 4.22–5.81)
RDW: 23 % — AB (ref 11.5–15.5)
WBC: 2.8 10*3/uL — AB (ref 4.0–10.5)

## 2013-11-15 LAB — RENAL FUNCTION PANEL
ALBUMIN: 2.6 g/dL — AB (ref 3.5–5.2)
BUN: 51 mg/dL — AB (ref 6–23)
CALCIUM: 6.3 mg/dL — AB (ref 8.4–10.5)
CO2: 25 mEq/L (ref 19–32)
CREATININE: 3.4 mg/dL — AB (ref 0.50–1.35)
Chloride: 96 mEq/L (ref 96–112)
GFR calc Af Amer: 19 mL/min — ABNORMAL LOW (ref >90)
GFR calc non Af Amer: 17 mL/min — ABNORMAL LOW (ref >90)
GLUCOSE: 114 mg/dL — AB (ref 70–99)
PHOSPHORUS: 2.5 mg/dL (ref 2.3–4.6)
Potassium: 3.6 mEq/L — ABNORMAL LOW (ref 3.7–5.3)
Sodium: 137 mEq/L (ref 137–147)

## 2013-11-15 LAB — LACTATE DEHYDROGENASE: LDH: 764 U/L — ABNORMAL HIGH (ref 94–250)

## 2013-11-15 LAB — RETICULOCYTES
RBC.: 2.62 MIL/uL — AB (ref 4.22–5.81)
Retic Count, Absolute: 227.9 10*3/uL — ABNORMAL HIGH (ref 19.0–186.0)
Retic Ct Pct: 8.7 % — ABNORMAL HIGH (ref 0.4–3.1)

## 2013-11-15 MED ORDER — TAMSULOSIN HCL 0.4 MG PO CAPS
0.4000 mg | ORAL_CAPSULE | Freq: Every day | ORAL | Status: DC
  Administered 2013-11-15 – 2013-11-17 (×2): 0.4 mg via ORAL
  Filled 2013-11-15 (×3): qty 1

## 2013-11-15 MED ORDER — POLYETHYLENE GLYCOL 3350 17 G PO PACK
17.0000 g | PACK | Freq: Two times a day (BID) | ORAL | Status: DC | PRN
  Administered 2013-11-15: 17 g via ORAL
  Filled 2013-11-15: qty 1

## 2013-11-15 MED ORDER — SODIUM CHLORIDE 0.9 % IV SOLN
1.0000 g | Freq: Once | INTRAVENOUS | Status: AC
  Administered 2013-11-15: 1 g via INTRAVENOUS
  Filled 2013-11-15: qty 10

## 2013-11-15 MED ORDER — POTASSIUM CHLORIDE CRYS ER 20 MEQ PO TBCR
40.0000 meq | EXTENDED_RELEASE_TABLET | Freq: Two times a day (BID) | ORAL | Status: DC
  Administered 2013-11-15 – 2013-11-16 (×3): 40 meq via ORAL
  Filled 2013-11-15 (×4): qty 2

## 2013-11-15 NOTE — Progress Notes (Signed)
[facility]  Trevor Michael LZJ:[id number] DOB: [birth year] DOA: 11/09/2013 PCP: Adella Hare, MD  Brief narrative: 73 year old male patient with multiple myeloma status post stem cell transplant January 2014 undergoing chemotherapy. Presented to the ER on 11/09/2013 with altered mentation. He had been seen in the ER during the previous 24 hours with complaints of weakness and dizziness. He was subsequently discharged home after an unremarkable workup. He was pancytopenic but this was stable and at baseline.  He returned to the ER after having a syncopal episode in the bathroom with subsequent altered mentation. EMS was called to the home and they found the patient to be very disoriented to time and situation but without focal neurological deficits. On arrival he was noted to have a rectal temperature of 104.2. Additional history meds at the patient has been experiencing severe anemia related to his ongoing chemotherapy and has required blood transfusions as recently as 2 weeks prior. He also is experiencing decreased oral intake and having diarrhea after taking laxatives for constipation. He also took a sleeping pill prior to the syncopal event in this very well may have contributed to the symptoms although most likely culprit was probably symptomatic anemia given the fact his hemoglobin was 7.3 at presentation.  Cultures were obtained and the patient was started on broad-spectrum antibiotics. Chest x-ray was unremarkable.  After admission oncology was consulted after patient was found to have significant worsening of his thrombocytopenia. It was felt that the patient's thrombocytopenia was possibly due to TTP. He was eventually transfused packed red blood cells and platelets. He subsequently decompensated with worsening fevers up to 105.4, tachypnea, crackles on exam and overall respiratory distress. He was subsequently transferred to the ICU. Because of fevers  of uncertain etiology infectious disease service was consulted and patient was continued on broad-spectrum antibiotics.  In regards to the workup for the hemolytic anemia the DAT was negative and there was concern that said symptoms worsened after patient received transfusions that he may been experiencing a transfusion reaction. It was felt that the pancytopenia was primarily secondary to chemotherapy multiple myeloma and not TTP.  In the interim the patient's renal function has worsened. He endorses symptoms of nausea and vomiting likely related to azotemia. He continues to make urine. He was otherwise stable though and was transferred out of the ICU.  HPI/Subjective: Patient alert. pleasant as always- no complaints.   Assessment/Plan: Active Problems:   Multiple myeloma -On salvage chemotherapy w Calfizomib plus dexamethasone prior to admission -continue IVIG 400 mg per kilogram per day x3 days- stopped today -Recommend continuing steroids for a total of 3 days- will d/c today -Holding chemotherapy at present - Dr Alvy Bimler states she will not see the pt on Monday- if there are any issues we can call her anytime.    Fever -Etiology still unclear but likely had an infectiondue to low IG G levels-- Oncology does not think fever came from the Myeloma- fevers resolved after given IV Ig -Cultures have been negative so far     Acute renal failure/ metabolic acidosis -Nephrology consulted and feels etiology secondary to  ATN in setting of recent hypotension and syncope with multiple myeloma and chemotherapy less likely culprits -Renal ultrasound: medical renal dz - improving daily    Acute anemia with hemolysis-non immune (due to acute infection?) -IVIG as noted above -Initially received  units of packed red blood cells - possibly had a reaction to this blood  -Hgb  down to 5.4 on 3/27 so oncology has ordered 2 units PRBCs bringing Hgb up to 8.1- remaining stable now    Pancytopenia due to  chemotherapy/MM -improving.     Syncope/Dehydration on admission -related to dehydration in setting of ingestion of sleep aid and anemia -Follow closely -No arrhythmias have been noted on telemetry since admission    Nausea & vomiting -Suspect related to azotemia from acute renal failure-resolving- pt able to eat now -Symptom management    HYPERTENSION, MILD -Blood pressure well controlled and overall has been soft since admission -At home was on Tenoretic which currently is on hold    BENIGN PROSTATIC HYPERTROPHY -Likely explains bleeding after catheter placement-hematuria has resolved - Onc would like foley out - his urologist wanted to start Rapaflo as outpt- will start Flomax here    Elevated troponin -Mild elevation without ischemic changes or chest pain and likely related to demand ischemia   DVT prophylaxis: SCDs Code Status: Full Family Communication: No family at bedside Disposition Plan/Expected LOS: ok to transfer to floor per Onc   Consultants: Oncology Infectious disease Nephrology   Procedures: 2-D echocardiogram - Left ventricle: The cavity size was normal. Systolic function was normal. The estimated ejection fraction was in the range of 55% to 60%. Wall motion was normal; there were no regional wall motion abnormalities. - Mitral valve: Mild regurgitation.   Antibiotics: Acyclovir 3/24 >>> Tamiflu 3/24 >>> 3/25 Zosyn 3/24 >>> Vancomycin 3/24 >>>   Objective: Blood pressure 128/69, pulse 63, temperature 98.4 F (36.9 C), temperature source Oral, resp. rate 16, height 5' 9"  (1.753 m), weight 67.4 kg (148 lb 9.4 oz), SpO2 100.00%.  Intake/Output Summary (Last 24 hours) at 11/15/13 1207 Last data filed at 11/15/13 0800  Gross per 24 hour  Intake   1593 ml  Output   2275 ml  Net   -682 ml     Exam: General: No acute respiratory distress Lungs: Clear to auscultation bilaterally without wheezes or crackles, RA Cardiovascular: Regular  bradycardic rate and rhythm without murmur gallop or rub normal S1 and S2, no peripheral edema or JVD Abdomen: Nontender, nondistended, soft, bowel sounds positive, no rebound, no ascites, no appreciable mass Musculoskeletal: No significant cyanosis, clubbing of bilateral lower extremities Neurological: Alert and oriented x 3, moves all extremities x 4 without focal neurological deficits, CN 2-12 intact  Scheduled Meds:  Scheduled Meds: . acyclovir  400 mg Oral Daily  . allopurinol  100 mg Oral Daily  . aspirin EC  325 mg Oral Daily  . brimonidine  1 drop Both Eyes BID  . docusate sodium  100 mg Oral BID  . feeding supplement (RESOURCE BREEZE)  1 Container Oral QPC supper  . folic acid  1 mg Oral Daily  . latanoprost  1 drop Both Eyes QHS  . pantoprazole  40 mg Oral Daily  . potassium chloride  40 mEq Oral BID  . sodium chloride  3 mL Intravenous Q12H  . tamsulosin  0.4 mg Oral Daily  . timolol  1 drop Both Eyes BID   Continuous Infusions:    Data Reviewed: Basic Metabolic Panel:  Recent Labs Lab 11/10/13 0341 11/11/13 0221 11/12/13 0107 11/13/13 0431 11/14/13 0306 11/15/13 0340  NA 133* 133* 132* 133* 135* 137  K 3.8 3.6* 3.4* 3.3* 3.3* 3.6*  CL 94* 93* 95* 99 99 96  CO2 20 19 18* 16* 18* 25  GLUCOSE 105* 158* 157* 134* 137* 114*  BUN 20 36* 56* 60* 52* 51*  CREATININE 1.31 2.38* 3.59* 3.88* 3.82* 3.40*  CALCIUM 8.2* 7.7* 6.9* 6.1* 6.1* 6.3*  MG 2.0  --   --   --   --   --   PHOS 2.5  --   --  4.3 3.0 2.5   Liver Function Tests:  Recent Labs Lab 11/09/13 2310 11/10/13 0341 11/11/13 0221 11/13/13 0431 11/14/13 0306 11/15/13 0340  AST 42* 50* 110* 25  --   --   ALT 14 15 20 14   --   --   ALKPHOS 98 104 84 79  --   --   BILITOT 2.8* 3.7* 4.3* 0.7  --   --   PROT 5.9* 6.3 5.8* 5.6*  --   --   ALBUMIN 3.5 3.7 3.2* 2.6* 2.6* 2.6*   No results found for this basename: LIPASE, AMYLASE,  in the last 168 hours No results found for this basename: AMMONIA,  in the  last 168 hours CBC:  Recent Labs Lab 11/08/13 1300 11/09/13 2310  11/10/13 0341 11/11/13 0221 11/13/13 0431 11/14/13 0306 11/15/13 0340  WBC 2.3* 2.4*  --  2.3* 2.6* 1.7* 2.2* 2.8*  NEUTROABS 1.4* 1.4*  --   --  1.6*  --   --  1.8  HGB 8.1* 7.3*  < > 7.6* 7.2* 5.4* 8.1* 8.7*  HCT 23.4* 20.2*  < > 20.8* 20.4* 15.3* 22.7* 24.5*  MCV 92.9 92.2  --  94.1 95.3 95.6 91.9 93.5  PLT 21* 18*  --  19* 20* 39* 48* 62*  < > = values in this interval not displayed. Cardiac Enzymes:  Recent Labs Lab 11/09/13 2310  11/10/13 2013 11/10/13 2240 11/11/13 1003 11/11/13 1926 11/12/13 0102  CKTOTAL 75  --   --   --   --   --   --   TROPONINI  --   < > 0.40* 0.73* 0.55* 0.34* 0.33*  < > = values in this interval not displayed. BNP (last 3 results) No results found for this basename: PROBNP,  in the last 8760 hours CBG: No results found for this basename: GLUCAP,  in the last 168 hours  Recent Results (from the past 240 hour(s))  CULTURE, BLOOD (ROUTINE X 2)     Status: None   Collection Time    11/09/13 11:00 PM      Result Value Ref Range Status   Specimen Description BLOOD LEFT ARM   Final   Special Requests BOTTLES DRAWN AEROBIC AND ANAEROBIC 10CC EA   Final   Culture  Setup Time     Final   Value: 11/10/2013 03:34     Performed at [facility]   Culture     Final   Value:        BLOOD CULTURE RECEIVED NO GROWTH TO DATE CULTURE WILL BE HELD FOR 5 DAYS BEFORE ISSUING A FINAL NEGATIVE REPORT     Performed at [facility]   Report Status PENDING   Incomplete  CULTURE, BLOOD (ROUTINE X 2)     Status: None   Collection Time    11/09/13 11:10 PM      Result Value Ref Range Status   Specimen Description BLOOD RIGHT ARM   Final   Special Requests BOTTLES DRAWN AEROBIC ONLY [facility]   Final   Culture  Setup Time     Final   Value: 11/10/2013 03:34     Performed at [facility]   Culture     Final   Value:  BLOOD CULTURE RECEIVED NO GROWTH TO DATE CULTURE WILL  BE HELD FOR 5 DAYS BEFORE ISSUING A FINAL NEGATIVE REPORT     Performed at [facility]   Report Status PENDING   Incomplete  URINE CULTURE     Status: None   Collection Time    11/10/13 10:13 PM      Result Value Ref Range Status   Specimen Description URINE, CATHETERIZED   Final   Special Requests Immunocompromised   Final   Culture  Setup Time     Final   Value: 11/11/2013 00:40     Performed at [facility] Count     Final   Value: NO GROWTH     Performed at [facility]   Culture     Final   Value: NO GROWTH     Performed at [facility]   Report Status 11/12/2013 FINAL   Final     Studies:  Recent x-ray studies have been reviewed in detail by the Attending Physician  Time spent : 30 min  Brently Voorhis,MD Pager # [email or web address] 11/15/2013, 12:07 PM

## 2013-11-15 NOTE — Progress Notes (Signed)
Subjective:  Good UOP- creatinine better today- he looks good- no c/o  Objective Vital signs in last 24 hours: Filed Vitals:   11/15/13 0000 11/15/13 0200 11/15/13 0331 11/15/13 0600  BP: 124/67 128/74 143/74 132/68  Pulse: 61 58 72 63  Temp:   98.1 F (36.7 C)   TempSrc:   Oral   Resp: 10 18 16 12   Height:      Weight:   67.4 kg (148 lb 9.4 oz)   SpO2: 95% 98% 99% 98%   Weight change:   Intake/Output Summary (Last 24 hours) at 11/15/13 0754 Last data filed at 11/15/13 0600  Gross per 24 hour  Intake   1971 ml  Output   1675 ml  Net    296 ml    Assessment/ Plan: Pt is a 73 y.o. yo male with hx myeloma/stem cell tx/chemo who was admitted on 11/09/2013 with febrile syndrome with hypotension /anemia Assessment/Plan: 1. Renal- AKI in the setting of severe hypotension- U/A c/w ATN-U/S unrevealing-  nonoliguric-creatinine seems to have peaked and is starting to improve.  No indications for dialysis. 2. Metabolic acidosis- due to renal failure- giving bicarb in fluids- actually will stop IVF now - is improved 3. Myeloma/Anemia- per oncology- s/p IVIg and  transfusion - holding further chemo for now 4. HTN/volume- will stop IVF as I feel tank is full 5. Hypokalemia- repletion- done PRN- does not need any today 6. Hyponatremia- improved with fluids  7. Febrile syndrome- per ID  Sanda Dejoy A    Labs: Basic Metabolic Panel:  Recent Labs Lab 11/13/13 0431 11/14/13 0306 11/15/13 0340  NA 133* 135* 137  K 3.3* 3.3* 3.6*  CL 99 99 96  CO2 16* 18* 25  GLUCOSE 134* 137* 114*  BUN 60* 52* 51*  CREATININE 3.88* 3.82* 3.40*  CALCIUM 6.1* 6.1* 6.3*  PHOS 4.3 3.0 2.5   Liver Function Tests:  Recent Labs Lab 11/10/13 0341 11/11/13 0221 11/13/13 0431 11/14/13 0306 11/15/13 0340  AST 50* 110* 25  --   --   ALT 15 20 14   --   --   ALKPHOS 104 84 79  --   --   BILITOT 3.7* 4.3* 0.7  --   --   PROT 6.3 5.8* 5.6*  --   --   ALBUMIN 3.7 3.2* 2.6* 2.6* 2.6*   No  results found for this basename: LIPASE, AMYLASE,  in the last 168 hours No results found for this basename: AMMONIA,  in the last 168 hours CBC:  Recent Labs Lab 11/09/13 2310  11/10/13 0341 11/11/13 0221 11/13/13 0431 11/14/13 0306 11/15/13 0340  WBC 2.4*  --  2.3* 2.6* 1.7* 2.2* 2.8*  NEUTROABS 1.4*  --   --  1.6*  --   --  1.8  HGB 7.3*  < > 7.6* 7.2* 5.4* 8.1* 8.7*  HCT 20.2*  < > 20.8* 20.4* 15.3* 22.7* 24.5*  MCV 92.2  --  94.1 95.3 95.6 91.9 93.5  PLT 18*  --  19* 20* 39* 48* 62*  < > = values in this interval not displayed. Cardiac Enzymes:  Recent Labs Lab 11/09/13 2310  11/10/13 2013 11/10/13 2240 11/11/13 1003 11/11/13 1926 11/12/13 0102  CKTOTAL 75  --   --   --   --   --   --   TROPONINI  --   < > 0.40* 0.73* 0.55* 0.34* 0.33*  < > = values in this interval not displayed. CBG: No results found for  this basename: GLUCAP,  in the last 168 hours  Iron Studies: No results found for this basename: IRON, TIBC, TRANSFERRIN, FERRITIN,  in the last 72 hours Studies/Results: No results found. Medications: Infusions: .  sodium bicarbonate  infusion 1000 mL 75 mL/hr at 11/15/13 0400    Scheduled Medications: . acyclovir  400 mg Oral Daily  . allopurinol  100 mg Oral Daily  . aspirin EC  325 mg Oral Daily  . brimonidine  1 drop Both Eyes BID  . docusate sodium  100 mg Oral BID  . feeding supplement (RESOURCE BREEZE)  1 Container Oral QPC supper  . folic acid  1 mg Oral Daily  . latanoprost  1 drop Both Eyes QHS  . pantoprazole  40 mg Oral Daily  . sodium chloride  3 mL Intravenous Q12H  . timolol  1 drop Both Eyes BID    have reviewed scheduled and prn medications.  Physical Exam: General: alert, does not look as bad as his issues Heart: RRR Lungs: mostly clear Abdomen: soft, non tender Extremities: mild edema  Leeloo Silverthorne A   11/15/2013,7:54 AM  LOS: 6 days

## 2013-11-16 ENCOUNTER — Telehealth: Payer: Self-pay | Admitting: Medical Oncology

## 2013-11-16 ENCOUNTER — Other Ambulatory Visit: Payer: Self-pay | Admitting: Hematology and Oncology

## 2013-11-16 ENCOUNTER — Other Ambulatory Visit: Payer: Self-pay | Admitting: *Deleted

## 2013-11-16 DIAGNOSIS — C9000 Multiple myeloma not having achieved remission: Secondary | ICD-10-CM

## 2013-11-16 LAB — CBC WITH DIFFERENTIAL/PLATELET
Basophils Absolute: 0 10*3/uL (ref 0.0–0.1)
Basophils Relative: 0 % (ref 0–1)
EOS PCT: 2 % (ref 0–5)
Eosinophils Absolute: 0 10*3/uL (ref 0.0–0.7)
HEMATOCRIT: 28 % — AB (ref 39.0–52.0)
HEMOGLOBIN: 9.7 g/dL — AB (ref 13.0–17.0)
Lymphocytes Relative: 25 % (ref 12–46)
Lymphs Abs: 0.6 10*3/uL — ABNORMAL LOW (ref 0.7–4.0)
MCH: 33.2 pg (ref 26.0–34.0)
MCHC: 34.6 g/dL (ref 30.0–36.0)
MCV: 95.9 fL (ref 78.0–100.0)
Monocytes Absolute: 0.3 10*3/uL (ref 0.1–1.0)
Monocytes Relative: 14 % — ABNORMAL HIGH (ref 3–12)
NEUTROS PCT: 59 % (ref 43–77)
Neutro Abs: 1.4 10*3/uL — ABNORMAL LOW (ref 1.7–7.7)
Platelets: 66 10*3/uL — ABNORMAL LOW (ref 150–400)
RBC: 2.92 MIL/uL — AB (ref 4.22–5.81)
RDW: 22.7 % — ABNORMAL HIGH (ref 11.5–15.5)
WBC: 2.3 10*3/uL — ABNORMAL LOW (ref 4.0–10.5)

## 2013-11-16 LAB — CULTURE, BLOOD (ROUTINE X 2)
CULTURE: NO GROWTH
Culture: NO GROWTH

## 2013-11-16 LAB — BASIC METABOLIC PANEL
BUN: 49 mg/dL — ABNORMAL HIGH (ref 6–23)
CO2: 20 meq/L (ref 19–32)
Calcium: 6.6 mg/dL — ABNORMAL LOW (ref 8.4–10.5)
Chloride: 101 mEq/L (ref 96–112)
Creatinine, Ser: 3.29 mg/dL — ABNORMAL HIGH (ref 0.50–1.35)
GFR calc Af Amer: 20 mL/min — ABNORMAL LOW (ref >90)
GFR calc non Af Amer: 17 mL/min — ABNORMAL LOW (ref >90)
Glucose, Bld: 87 mg/dL (ref 70–99)
POTASSIUM: 4.2 meq/L (ref 3.7–5.3)
SODIUM: 136 meq/L — AB (ref 137–147)

## 2013-11-16 LAB — HEPATIC FUNCTION PANEL
ALT: 11 U/L (ref 0–53)
AST: 23 U/L (ref 0–37)
Albumin: 2.6 g/dL — ABNORMAL LOW (ref 3.5–5.2)
Alkaline Phosphatase: 78 U/L (ref 39–117)
Bilirubin, Direct: <0.2 mg/dL (ref 0.0–0.3)
Total Bilirubin: 0.8 mg/dL (ref 0.3–1.2)
Total Protein: 5.6 g/dL — ABNORMAL LOW (ref 6.0–8.3)

## 2013-11-16 LAB — LACTATE DEHYDROGENASE: LDH: 744 U/L — AB (ref 94–250)

## 2013-11-16 LAB — PHOSPHORUS: Phosphorus: 2.7 mg/dL (ref 2.3–4.6)

## 2013-11-16 LAB — RETICULOCYTES
RBC.: 2.92 MIL/uL — ABNORMAL LOW (ref 4.22–5.81)
RETIC CT PCT: 9.8 % — AB (ref 0.4–3.1)
Retic Count, Absolute: 286.2 10*3/uL — ABNORMAL HIGH (ref 19.0–186.0)

## 2013-11-16 MED ORDER — SODIUM BICARBONATE 650 MG PO TABS
650.0000 mg | ORAL_TABLET | Freq: Two times a day (BID) | ORAL | Status: DC
  Administered 2013-11-16 – 2013-11-17 (×3): 650 mg via ORAL
  Filled 2013-11-16 (×4): qty 1

## 2013-11-16 MED ORDER — CALCIUM CARBONATE ANTACID 500 MG PO CHEW
1.0000 | CHEWABLE_TABLET | Freq: Three times a day (TID) | ORAL | Status: DC
  Administered 2013-11-16 – 2013-11-17 (×3): 200 mg via ORAL
  Filled 2013-11-16 (×5): qty 1

## 2013-11-16 NOTE — Progress Notes (Signed)
[facility] for Infectious Disease  Date of Admission:  11/09/2013  Antibiotics: None, previously on vancomycin and zosyn  Subjective: No complaints, feels good  Objective: Temp:  [98.1 F (36.7 C)-98.2 F (36.8 C)] 98.2 F (36.8 C) (03/30 0534) Pulse Rate:  [66-71] 66 (03/30 0534) Resp:  [16] 16 (03/30 0534) BP: (134-154)/(73-74) 134/73 mmHg (03/30 0534) SpO2:  [100 %] 100 % (03/30 0534)  General: Awake, in chair, nad Skin: no rashes Lungs: CTA B Cor: RRR Abdomen: soft, nt, nd Ext: no edema  Lab Results Lab Results  Component Value Date   WBC 2.3* 11/16/2013   HGB 9.7* 11/16/2013   HCT 28.0* 11/16/2013   MCV 95.9 11/16/2013   PLT 66* 11/16/2013    Lab Results  Component Value Date   CREATININE 3.29* 11/16/2013   BUN 49* 11/16/2013   NA 136* 11/16/2013   K 4.2 11/16/2013   CL 101 11/16/2013   CO2 20 11/16/2013    Lab Results  Component Value Date   ALT 11 11/16/2013   AST 23 11/16/2013   ALKPHOS 78 11/16/2013   BILITOT 0.8 11/16/2013      Microbiology: Recent Results (from the past 240 hour(s))  CULTURE, BLOOD (ROUTINE X 2)     Status: None   Collection Time    11/09/13 11:00 PM      Result Value Ref Range Status   Specimen Description BLOOD LEFT ARM   Final   Special Requests BOTTLES DRAWN AEROBIC AND ANAEROBIC 10CC EA   Final   Culture  Setup Time     Final   Value: 11/10/2013 03:34     Performed at [facility]   Culture     Final   Value: NO GROWTH 5 DAYS     Performed at [facility]   Report Status 11/16/2013 FINAL   Final  CULTURE, BLOOD (ROUTINE X 2)     Status: None   Collection Time    11/09/13 11:10 PM      Result Value Ref Range Status   Specimen Description BLOOD RIGHT ARM   Final   Special Requests BOTTLES DRAWN AEROBIC ONLY [facility]   Final   Culture  Setup Time     Final   Value: 11/10/2013 03:34     Performed at [facility]   Culture     Final   Value: NO GROWTH 5 DAYS     Performed at [facility]   Report Status 11/16/2013 FINAL   Final  URINE CULTURE     Status: None   Collection Time    11/10/13 10:13 PM      Result Value Ref Range Status   Specimen Description URINE, CATHETERIZED   Final   Special Requests Immunocompromised   Final   Culture  Setup Time     Final   Value: 11/11/2013 00:40     Performed at [facility] Count     Final   Value: NO GROWTH     Performed at [facility]   Culture     Final   Value: NO GROWTH     Performed at [facility]   Report Status 11/12/2013 FINAL   Final    Studies/Results: No results found.  Assessment/Plan: 1) febrile illness - resolved.  Currently asymptomatic off of antibiotics.  No further antibiotics indicated at this time.   I will sign off, thanks for consult.    Sirus Labrie, [employer],  MD [place] [facility] Infectious Disease [facility] [email or web address] [phone] pager   [phone] cell 11/16/2013, 2:26 PM

## 2013-11-16 NOTE — Progress Notes (Signed)
Subjective:   1 liter of UOP recorded for 24 hours He says nobody measuring now that he is out of stepdown Nausea better "feel pretty good today" Stable BP Objective Vital signs in last 24 hours: Filed Vitals:   11/15/13 0809 11/15/13 1138 11/15/13 2214 11/16/13 0534  BP: 147/77 128/69 154/74 134/73  Pulse: 61 63 71 66  Temp: 97.8 F (36.6 C) 98.4 F (36.9 C) 98.1 F (36.7 C) 98.2 F (36.8 C)  TempSrc: Oral Oral Oral Oral  Resp: 12 16 16 16   Height:      Weight:      SpO2: 98% 100% 100% 100%   Physical Exam: General: alert,  Heart: RRR Lungs: Clear to auscultation. No crackles or wheezes Abdomen: soft, non tender Extremities: No LE edema  Labs: Basic Metabolic Panel:  Recent Labs Lab 11/14/13 0306 11/15/13 0340 11/16/13 0650  NA 135* 137 136*  K 3.3* 3.6* 4.2  CL 99 96 101  CO2 18* 25 20  GLUCOSE 137* 114* 87  BUN 52* 51* 49*  CREATININE 3.82* 3.40* 3.29*  CALCIUM 6.1* 6.3* 6.6*  PHOS 3.0 2.5 2.7   Liver Function Tests:  Recent Labs Lab 11/11/13 0221 11/13/13 0431 11/14/13 0306 11/15/13 0340 11/16/13 0650  AST 110* 25  --   --  23  ALT 20 14  --   --  11  ALKPHOS 84 79  --   --  78  BILITOT 4.3* 0.7  --   --  0.8  PROT 5.8* 5.6*  --   --  5.6*  ALBUMIN 3.2* 2.6* 2.6* 2.6* 2.6*    Recent Labs Lab 11/11/13 0221 11/13/13 0431 11/14/13 0306 11/15/13 0340 11/16/13 0650  WBC 2.6* 1.7* 2.2* 2.8* 2.3*  NEUTROABS 1.6*  --   --  1.8 1.4*  HGB 7.2* 5.4* 8.1* 8.7* 9.7*  HCT 20.4* 15.3* 22.7* 24.5* 28.0*  MCV 95.3 95.6 91.9 93.5 95.9  PLT 20* 39* 48* 62* 66*   Cardiac Enzymes:  Recent Labs Lab 11/09/13 2310  11/10/13 2013 11/10/13 2240 11/11/13 1003 11/11/13 1926 11/12/13 0102  CKTOTAL 75  --   --   --   --   --   --   TROPONINI  --   < > 0.40* 0.73* 0.55* 0.34* 0.33*  < > = values in this interval not displayed. Medications:  Scheduled Medications: . acyclovir  400 mg Oral Daily  . allopurinol  100 mg Oral Daily  . aspirin EC  325  mg Oral Daily  . brimonidine  1 drop Both Eyes BID  . docusate sodium  100 mg Oral BID  . feeding supplement (RESOURCE BREEZE)  1 Container Oral QPC supper  . folic acid  1 mg Oral Daily  . latanoprost  1 drop Both Eyes QHS  . pantoprazole  40 mg Oral Daily  . potassium chloride  40 mEq Oral BID  . sodium chloride  3 mL Intravenous Q12H  . tamsulosin  0.4 mg Oral Daily  . timolol  1 drop Both Eyes BID    have reviewed scheduled and prn medications.  Assessment/ Plan: Pt is a 73 y.o. yo male with hx myeloma/stem cell tx/chemo who was admitted on 11/09/2013 with febrile syndrome with hypotension /anemia  1. Renal- AKI in the setting of severe hypotension- U/A c/w ATN-U/S unrevealing Creatinine peaked at 3.88 and is slowly trending down (baseline 0.9 on 3/17; today down to 3.29) Non-oliguric  Did not require dialysis   2. Metabolic acidosis- due  to renal failure Improved with bicarb containing IVF (stopped) CO2 down a bit Add oral bicarb 650 BID po 3. Myeloma/Anemia- per oncology- s/p IVIg and  transfusion - holding further chemo for now 4. HTN/volume- IVF stopped yesterday 5. Hypokalemia- repletion-on 40 BID as standing dose; change to prn replacement 6. Hyponatremia- improved  7. Hypocalcemia - heme onc says on replacement but I don't see any. Add CacO3 between meals  7. Febrile syndrome- per ID   Tateanna Bach B 11/16/2013,11:59 AM  LOS: 7 days

## 2013-11-16 NOTE — Progress Notes (Signed)
PATIENT DETAILS Name: MJ WILLIS Age: 73 y.o. Sex: male Date of Birth: 09-06-1940 Admit Date: 11/09/2013 Admitting Physician Toy Baker, MD PIR:JJOACZY Norins, MD  Brief Summary 73 year old male patient with multiple myeloma status post stem cell transplant January 2014 undergoing chemotherapy presented to the hospital after having a syncopal episode with subsequent altered mental status. Was found to be febrile. Patient was admitted, was started on broad-spectrum antibiotics, however no foci of infection was evident, urine and blood cultures were obtained and have remained negative to date. Hospital course has been complicated by worsening anemia secondary to hemolysis, worsening thrombocytopenia requiring a 3 days course of IVIG and Steroids.  Hospital course has also been complicated by development of acute renal failure secondary to ATN. Infectious disease, critical care, oncology and nephrology has been consulted during the patient's hospital stay. Since all cultures continued to be negative, antibiotics have been discontinued on 3/27, patient continues to rapidly improve and remains afebrile. His renal function also has improved significantly.   Subjective: No major complains- wants to go home.  Assessment/Plan: Sepsis - Resolved, suspected to be infectious etiology, however blood and urine cultures negative. - Off antibiotics since 3/27, remains afebrile and clinically much improved.  Fever of unknown origin - On admission, had a fever of 105.1F. Was started on empiric vancomycin, Zosyn along with Tamiflu. Blood and urine cultures were drawn which were subsequently negative. Numerous chest x-rays done on admission were negative as well. Renal ultrasound was negative for abscess. Infectious disease was consulted during this hospital stay, after 4 days of empiric antibiotic therapy with vancomycin and Zosyn, infectious disease on 3/27 stop all antibiotics, patient is  currently afebrile, clinically improved off antibiotics.  Acute renal failure with metabolic acidosis - Nephrology was consulted, patient at baseline has normal renal function. At this time, etiology is felt to be acute tubular necrosis in the setting of hypotension. No indication for dialysis at this point. Renal function improving. Nephrology continued to follow.  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 Hematology was consulted during this hospital stay, patient has received a three-day course of IVIG and corticosteroid. He was also transfused 2 units of PRBC on 3/27. Hemoglobin is improved significantly at 9.7. Continue to monitor periodically.  Thrombocytopenia - Per hematology likely multifactorial from recent infection and possible side effects of his numerous medication. As noted above, patient has finished a three-day course of IVIG and steroid therapy. Platelet count has slowly improved and currently stable at 66,000. We'll continue to monitor periodically.  Leukopenia - Likely secondary to multiple myeloma, ongoing pancytopenia secondary to chemotherapy therapy  - Stable at this point. - Per hematology There is no role for G-CSF - Off on broad-spectrum antibiotics, continue prophylactic acyclovir.  Suspected transfusion reaction - Occurred on 3/24, Resolved with supportive care  Elevated troponin  -Mild elevation without ischemic changes or chest pain and likely related to demand ischemia - Echocardiogram reveals normal ejection fraction, no wall motion abnormality. - Continue aspirin but watch platelet count, further workup deferred to the outpatient setting. Not a candidate currently for further invasive testing at this point.  Syncope - Partly secondary to dehydration, sleep aid, acute renal failure. - 2-D echocardiogram reveals normal ejection fraction. Suspect  no further workup is  required.  History of multiple myeloma -S/p stem cell transplant in 2014, was on salvage chemotherapy w Calfizomib plus dexamethasone- currently  on hold. Has followup with oncology in the outpatient setting  HYPERTENSION, MILD  -Blood pressure well controlled and overall has been soft since admission  -At home was on Tenoretic which currently is on hold-will slowly resume  BPH - Stable, continue Flomax.  Disposition: Remain inpatient  DVT Prophylaxis:  SCD's  Code Status: Full code   Family Communication None at bedside  Procedures:  None  CONSULTS:  pulmonary/intensive care, ID, nephrology and hematology/oncology  Time spent 40 minutes-which includes 50% of the time with face-to-face with patient/ family and coordinating care related to the above assessment and plan.    MEDICATIONS: Scheduled Meds: . acyclovir  400 mg Oral Daily  . allopurinol  100 mg Oral Daily  . aspirin EC  325 mg Oral Daily  . brimonidine  1 drop Both Eyes BID  . docusate sodium  100 mg Oral BID  . feeding supplement (RESOURCE BREEZE)  1 Container Oral QPC supper  . folic acid  1 mg Oral Daily  . latanoprost  1 drop Both Eyes QHS  . pantoprazole  40 mg Oral Daily  . potassium chloride  40 mEq Oral BID  . sodium chloride  3 mL Intravenous Q12H  . tamsulosin  0.4 mg Oral Daily  . timolol  1 drop Both Eyes BID   Continuous Infusions:  PRN Meds:.acetaminophen, acetaminophen, HYDROcodone-acetaminophen, loratadine, LORazepam, ondansetron (ZOFRAN) IV, ondansetron, polyethylene glycol, prochlorperazine  Antibiotics: Anti-infectives   Start     Dose/Rate Route Frequency Ordered Stop   11/15/13 1000  acyclovir (ZOVIRAX) tablet 400 mg     400 mg Oral Daily 11/14/13 1416     11/13/13 0600  vancomycin (VANCOCIN) IVPB 1000 mg/200 mL premix  Status:  Discontinued     1,000 mg 200 mL/hr over 60 Minutes Intravenous Every 48 hours 11/12/13 1312 11/13/13 1144   11/12/13 1400  vancomycin (VANCOCIN)  IVPB 750 mg/150 ml premix  Status:  Discontinued     750 mg 150 mL/hr over 60 Minutes Intravenous Every 24 hours 11/11/13 0905 11/12/13 1306   11/12/13 1400  piperacillin-tazobactam (ZOSYN) IVPB 2.25 g  Status:  Discontinued     2.25 g 100 mL/hr over 30 Minutes Intravenous 3 times per day 11/12/13 1305 11/13/13 1350   11/12/13 1000  oseltamivir (TAMIFLU) capsule 30 mg  Status:  Discontinued     30 mg Oral Daily 11/11/13 1034 11/11/13 1406   11/11/13 0600  vancomycin (VANCOCIN) IVPB 1000 mg/200 mL premix  Status:  Discontinued     1,000 mg 200 mL/hr over 60 Minutes Intravenous Every 24 hours 11/10/13 0310 11/11/13 0904   11/10/13 1000  acyclovir (ZOVIRAX) tablet 400 mg  Status:  Discontinued     400 mg Oral 2 times daily 11/10/13 0146 11/14/13 1416   11/10/13 0600  piperacillin-tazobactam (ZOSYN) IVPB 3.375 g  Status:  Discontinued     3.375 g 12.5 mL/hr over 240 Minutes Intravenous 3 times per day 11/10/13 0146 11/12/13 1304   11/10/13 0200  oseltamivir (TAMIFLU) capsule 30 mg  Status:  Discontinued     30 mg Oral 2 times daily 11/10/13 0146 11/11/13 1034   11/10/13 0000  piperacillin-tazobactam (ZOSYN) IVPB 4.5 g     4.5 g 200 mL/hr over 30 Minutes Intravenous  Once 11/09/13 2354 11/14/13 0527   11/10/13 0000  vancomycin (VANCOCIN) 1,500 mg in sodium chloride 0.9 % 500 mL IVPB     1,500 mg 250 mL/hr over 120 Minutes Intravenous  Once 11/09/13 2358 11/14/13 0527  PHYSICAL EXAM: Vital signs in last 24 hours: Filed Vitals:   11/15/13 0809 11/15/13 1138 11/15/13 2214 11/16/13 0534  BP: 147/77 128/69 154/74 134/73  Pulse: 61 63 71 66  Temp: 97.8 F (36.6 C) 98.4 F (36.9 C) 98.1 F (36.7 C) 98.2 F (36.8 C)  TempSrc: Oral Oral Oral Oral  Resp: 12 16 16 16   Height:      Weight:      SpO2: 98% 100% 100% 100%    Weight change:  Filed Weights   11/10/13 2000 11/13/13 0349 11/15/13 0331  Weight: 67.4 kg (148 lb 9.4 oz) 67.4 kg (148 lb 9.4 oz) 67.4 kg (148 lb 9.4 oz)    Body mass index is 21.93 kg/(m^2).   Gen Exam: Awake and alert with clear speech.   Neck: Supple, No JVD.   Chest: B/L Clear.   CVS: S1 S2 Regular, no murmurs.  Abdomen: soft, BS +, non tender, non distended.  Extremities: no edema, lower extremities warm to touch. Neurologic: Non Focal.   Skin: No Rash.   Wounds: N/A.    Intake/Output from previous day:  Intake/Output Summary (Last 24 hours) at 11/16/13 1045 Last data filed at 11/16/13 0431  Gross per 24 hour  Intake    180 ml  Output    401 ml  Net   -221 ml     LAB RESULTS: CBC  Recent Labs Lab 11/09/13 2310  11/11/13 0221 11/13/13 0431 11/14/13 0306 11/15/13 0340 11/16/13 0650  WBC 2.4*  < > 2.6* 1.7* 2.2* 2.8* 2.3*  HGB 7.3*  < > 7.2* 5.4* 8.1* 8.7* 9.7*  HCT 20.2*  < > 20.4* 15.3* 22.7* 24.5* 28.0*  PLT 18*  < > 20* 39* 48* 62* 66*  MCV 92.2  < > 95.3 95.6 91.9 93.5 95.9  MCH 33.3  < > 33.6 33.8 32.8 33.2 33.2  MCHC 36.1*  < > 35.3 35.3 35.7 35.5 34.6  RDW 18.6*  < > 20.5* 22.1* 21.9* 23.0* 22.7*  LYMPHSABS 0.5*  --  0.6*  --   --  0.5* 0.6*  MONOABS 0.4  --  0.4  --   --  0.5 0.3  EOSABS 0.0  --  0.0  --   --  0.0 0.0  BASOSABS 0.0  --  0.0  --   --  0.0 0.0  < > = values in this interval not displayed.  Chemistries   Recent Labs Lab 11/09/13 2318 11/10/13 0341  11/12/13 0107 11/13/13 0431 11/14/13 0306 11/15/13 0340 11/16/13 0650  NA 129* 133*  < > 132* 133* 135* 137 136*  K 3.4* 3.8  < > 3.4* 3.3* 3.3* 3.6* 4.2  CL 94* 94*  < > 95* 99 99 96 101  CO2  --  20  < > 18* 16* 18* 25 20  GLUCOSE 118* 105*  < > 157* 134* 137* 114* 87  BUN 19 20  < > 56* 60* 52* 51* 49*  CREATININE 1.40* 1.31  < > 3.59* 3.88* 3.82* 3.40* 3.29*  CALCIUM  --  8.2*  < > 6.9* 6.1* 6.1* 6.3* 6.6*  MG  --  2.0  --   --   --   --   --   --   < > = values in this interval not displayed.  CBG: No results found for this basename: GLUCAP,  in the last 168 hours  GFR Estimated Creatinine Clearance: 19.3 ml/min (by C-G  formula based on Cr of 3.29).  Coagulation profile No results found for this basename: INR, PROTIME,  in the last 168 hours  Cardiac Enzymes  Recent Labs Lab 11/11/13 1003 11/11/13 1926 11/12/13 0102  TROPONINI 0.55* 0.34* 0.33*    No components found with this basename: POCBNP,  No results found for this basename: DDIMER,  in the last 72 hours No results found for this basename: HGBA1C,  in the last 72 hours No results found for this basename: CHOL, HDL, LDLCALC, TRIG, CHOLHDL, LDLDIRECT,  in the last 72 hours No results found for this basename: TSH, T4TOTAL, FREET3, T3FREE, THYROIDAB,  in the last 72 hours  Recent Labs  11/15/13 0340 11/16/13 0650  RETICCTPCT 8.7* 9.8*   No results found for this basename: LIPASE, AMYLASE,  in the last 72 hours  Urine Studies No results found for this basename: UACOL, UAPR, USPG, UPH, UTP, UGL, UKET, UBIL, UHGB, UNIT, UROB, ULEU, UEPI, UWBC, URBC, UBAC, CAST, CRYS, UCOM, BILUA,  in the last 72 hours  MICROBIOLOGY: Recent Results (from the past 240 hour(s))  CULTURE, BLOOD (ROUTINE X 2)     Status: None   Collection Time    11/09/13 11:00 PM      Result Value Ref Range Status   Specimen Description BLOOD LEFT ARM   Final   Special Requests BOTTLES DRAWN AEROBIC AND ANAEROBIC 10CC EA   Final   Culture  Setup Time     Final   Value: 11/10/2013 03:34     Performed at [facility]   Culture     Final   Value: NO GROWTH 5 DAYS     Performed at [facility]   Report Status 11/16/2013 FINAL   Final  CULTURE, BLOOD (ROUTINE X 2)     Status: None   Collection Time    11/09/13 11:10 PM      Result Value Ref Range Status   Specimen Description BLOOD RIGHT ARM   Final   Special Requests BOTTLES DRAWN AEROBIC ONLY [facility]   Final   Culture  Setup Time     Final   Value: 11/10/2013 03:34     Performed at [facility]   Culture     Final   Value: NO GROWTH 5 DAYS     Performed at [facility]   Report Status  11/16/2013 FINAL   Final  URINE CULTURE     Status: None   Collection Time    11/10/13 10:13 PM      Result Value Ref Range Status   Specimen Description URINE, CATHETERIZED   Final   Special Requests Immunocompromised   Final   Culture  Setup Time     Final   Value: 11/11/2013 00:40     Performed at [place]     Final   Value: NO GROWTH     Performed at [facility]   Culture     Final   Value: NO GROWTH     Performed at [facility]   Report Status 11/12/2013 FINAL   Final    RADIOLOGY STUDIES/RESULTS: X-ray Chest Pa And Lateral   11/10/2013   CLINICAL DATA:  Fever.  EXAM: CHEST  2 VIEW  COMPARISON:  11/09/2013  FINDINGS: The heart size and mediastinal contours are within normal limits. Both lungs are clear. The visualized skeletal structures are unremarkable.  IMPRESSION: No active cardiopulmonary disease.   Electronically Signed   By: Sharrie Rothman.D.  On: 11/10/2013 13:55   US Renal  11/12/2013   CLINICAL DATA:  Acute renal failure.  Multiple myeloma.  EXAM: RENAL/URINARY TRACT ULTRASOUND COMPLETE  COMPARISON:  CT scan abdomen and pelvis dated 06/18/2012  FINDINGS: Right Kidney:  Length: 13.1 cm. Slight increased echogenicity of the renal parenchyma. Multiple renal cysts. The largest is 6.5 cm on the mid to lower pole. No hydronephrosis or solid mass.  Left Kidney:  Length: 13.2 cm. 5.5 cm cyst on the lateral aspect of the mid left kidney, minimally larger than on the prior CT scan. No hydronephrosis. Slight increased echogenicity of the renal parenchyma.  Bladder:  Foley catheter in place.  The bladder is empty.  IMPRESSION: Echogenic renal parenchyma consistent with renal medical disease. No obstruction. Bilateral renal cysts, stable.   Electronically Signed   By: Rozetta Nunnery M.D.   On: 11/12/2013 16:20   Dg Chest Port 1 View  11/10/2013   CLINICAL DATA:  Shortness of breath.  EXAM: PORTABLE CHEST - 1 VIEW  COMPARISON:  DG CHEST 2 VIEW  dated 11/10/2013; DG CHEST 1V PORT dated 11/09/2013  FINDINGS: Mediastinum and hilar structures are normal. Cardiomegaly. Pulmonary vascularity is normal. No pleural effusion or pneumothorax. Multiple lucencies are noted throughout the thoracic cage. This is consistent with myeloma and/or metastatic disease.  IMPRESSION: 1. No acute cardiopulmonary disease.  Stable cardiomegaly. 2. Innumerable lucencies noted throughout the thoracic cage consistent with myeloma and/or metastatic disease.   Electronically Signed   By: Marcello Moores  Register   On: 11/10/2013 18:41   Dg Chest Port 1 View  11/09/2013   CLINICAL DATA:  Altered mental status, history of multiple myeloma.  EXAM: PORTABLE CHEST - 1 VIEW  COMPARISON:  DG BONE SURVEY MET dated 07/21/2013  FINDINGS: The cardiac silhouette appears mildly enlarged, slightly more conspicuous than prior examination. Mild interstitial prominence without pleural effusions or focal consolidation. 3 mm nodule again seen in right lung base which may reflect granuloma. No pneumothorax.  Patient is osteopenic. Multiple tiny lytic lesions appear slightly worse though, not tailored for evaluation.  IMPRESSION: Mild cardiomegaly and mild interstitial prominence suggest pulmonary edema without focal consolidation.   Electronically Signed   By: Elon Alas   On: 11/09/2013 23:26    Oren Binet, MD  [facility] Pager:[phone]  If 7PM-7AM, please contact night-coverage [email or web address] Password [email or web address] 11/16/2013, 10:45 AM   LOS: 7 days

## 2013-11-16 NOTE — Care Management Note (Addendum)
    Page 1 of 2   11/17/2013     4:57:45 PM   CARE MANAGEMENT NOTE 11/17/2013  Patient:  Trevor Michael, Trevor Michael   Account Number:  [id number]  Date Initiated:  11/16/2013  Documentation initiated by:  Tomi Bamberger  Subjective/Objective Assessment:   dx syncope and confusion, multiple myloma  admit- lives with spouse.     Action/Plan:   Anticipated DC Date:  11/17/2013   Anticipated DC Plan:  [place]  CM consult      [facility] Choice  HOME HEALTH   Choice offered to / List presented to:  C-1 Patient   DME arranged  Vassie Moselle      DME agency  [place] arranged  HH-1 RN  [place].   Status of service:  Completed, signed off Medicare Important Message given?   (If response is "NO", the following Medicare IM given date fields will be blank) Date Medicare IM given:   Date Additional Medicare IM given:    Discharge Disposition:  [place]  Per UR Regulation:  Reviewed for med. necessity/level of care/duration of stay  If discussed at [place] of Stay Meetings, dates discussed:   11/17/2013    Comments:  11/17/13 1109 Tomi Bamberger RN, BSN [phone] patient is for dc today, NCM spoke with patient to see if he wanted to talk about getting hh services set up , he stated his wife will be calling me about this because she may have some questions. Patient states they will notify me when ready to set up hh services.  NCM received call from [place] , floor RN, she states patient would like to work with [facility] , for [facility] and HHPT, referral made to [facility]. Soc will begin 24-48 hrs .  Larkin Ina brought rolling walker to patient's room.  11/16/13 [place], BSN [phone] patient lives with spoue, NCM spoke with patient today and he is doing well, NCM sees stating spouse would like to have some help at home.  NCM will speak with spouse tomorrow regarding help  at home.  NCM left private duty list and home health list with patient, he will discuss with wife.

## 2013-11-16 NOTE — Progress Notes (Signed)
Trevor Michael   DOB:10-02-1940   RX#:[id number]    Subjective: The patient has been transferred out of the ICU. Overall, he is feeling well. The patient denies any recent signs or symptoms of bleeding such as spontaneous epistaxis, hematuria or hematochezia. He denies any fevers, chills or cough. He is ambulating well.  Objective:  Filed Vitals:   11/16/13 0534  BP: 134/73  Pulse: 66  Temp: 98.2 F (36.8 C)  Resp: 16     Intake/Output Summary (Last 24 hours) at 11/16/13 9509 Last data filed at 11/16/13 0431  Gross per 24 hour  Intake    180 ml  Output    401 ml  Net   -221 ml    GENERAL:alert, no distress and comfortable SKIN: skin color, texture, turgor are normal, no rashes or significant lesions EYES: normal, Conjunctiva are pink and non-injected, sclera clear OROPHARYNX:no exudate, no erythema and lips, buccal mucosa, and tongue normal  NECK: supple, thyroid normal size, non-tender, without nodularity LYMPH:  no palpable lymphadenopathy in the cervical, axillary or inguinal LUNGS: clear to auscultation and percussion with normal breathing effort HEART: regular rate & rhythm and no murmurs with trace mild bilateral lower extremity edema ABDOMEN:abdomen soft, non-tender and normal bowel sounds Musculoskeletal:no cyanosis of digits and no clubbing  NEURO: alert & oriented x 3 with fluent speech, no focal motor/sensory deficits   Labs:  Lab Results  Component Value Date   WBC 2.3* 11/16/2013   HGB 9.7* 11/16/2013   HCT 28.0* 11/16/2013   MCV 95.9 11/16/2013   PLT 66* 11/16/2013   NEUTROABS 1.4* 11/16/2013    Lab Results  Component Value Date   NA 136* 11/16/2013   K 4.2 11/16/2013   CL 101 11/16/2013   CO2 20 11/16/2013     Assessment & Plan:  #1 multiple myeloma His last set of labs from 09/01/2013 showed persistent M spike of 1.6 g, IgA lambda. Due to recent acute events with severe pancytopenia, his chemotherapy is placed on hold. I recommend to continue holding  off treatment and continue to provide supportive care. I will place an order for him to follow with Dr. Juliann Mule next week. #2 leukopenia The patient is mildly neutropenic. His white blood cell count overall is stable and the patient is afebrile. Currently, he is not septic. He is currently covered with broad-spectrum intravenous antibiotics.  There is no role for G-CSF. Continue supportive care. 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 His last set of labs suggested no signs of active hemolysis. The patient has received 3 days of Corticosteroid therapy along with IVIG. He has been transfused recently. Today, he has no symptoms of anemia, with hemoglobin continued to improve daily.. I recommend continue observation. I recommend for him to continue on folic acid supplements daily. I recommend we established transfusion threshold to transfuse him if his hemoglobin is less than 7 g. #4 thrombocytopenia This is improving. Cause is unknown, likely multifactorial, could be related to recent infection and related to side effects of medication. He has received 3 days of IVIG and corticosteroid therapy. His platelet count is improving. I recommend continue observation. I recommend we established transfusion threshold to transfuse him with platelets if he is platelet count dropped to less than 10,000 or the patient had signs of active bleeding. As his platelet counts continue to improve, consider starting him on DVT prophylaxis.  If he platelet count starts  to drop to less than 20,000, I recommend discontinuation of aspirin. #5 acute renal failure, resolving Cause is unknown, could be related to recent sepsis. He is following with nephrologist. The patient is drinking a lot of oral liquids. I have reduced the dose of acyclovir for reduce renal clearance. #6 hypocalcemia The patient is receiving replacement  therapy. #7 antimicrobial prophylaxis Recommend continue on acyclovir due to recent exposure to [place].  Overall, the patient continued to improve. From the hematology standpoint, he can be discharged and followed at Dr. Boyce Medici office next week. I will place an order for him to be seen early next week with repeat blood work. I will sign off. Please hesitate to, if questions arise.  [place], [place], MD 11/16/2013  8:11 AM

## 2013-11-16 NOTE — Telephone Encounter (Signed)
Pt's wife-Christine asking about getting help after pt is discharged. She is down in her hip and is not able to help care for her hubby. She was asking about agencies that could come out to help her. I do not see any notes from a discharge planner or social worker. I spoke with Polo Riley, SW here at [facility] and she will contact the social worker at [facility] regarding pt discharge needs. Christine notified that pt's needs will be addressed before her leaves the hospital so the agencies they will need should be in place. She voiced understanding.

## 2013-11-16 NOTE — Telephone Encounter (Signed)
PT. [place]. REVLIMID IS ON HOLD. NOTIFIED BIOLOG[facility].

## 2013-11-17 ENCOUNTER — Telehealth: Payer: Self-pay | Admitting: Internal Medicine

## 2013-11-17 ENCOUNTER — Telehealth: Payer: Self-pay | Admitting: *Deleted

## 2013-11-17 ENCOUNTER — Telehealth: Payer: Self-pay

## 2013-11-17 DIAGNOSIS — C9000 Multiple myeloma not having achieved remission: Secondary | ICD-10-CM

## 2013-11-17 DIAGNOSIS — A419 Sepsis, unspecified organism: Principal | ICD-10-CM

## 2013-11-17 DIAGNOSIS — R799 Abnormal finding of blood chemistry, unspecified: Secondary | ICD-10-CM

## 2013-11-17 DIAGNOSIS — N179 Acute kidney failure, unspecified: Secondary | ICD-10-CM

## 2013-11-17 LAB — RENAL FUNCTION PANEL
ALBUMIN: 2.8 g/dL — AB (ref 3.5–5.2)
BUN: 44 mg/dL — AB (ref 6–23)
CO2: 23 mEq/L (ref 19–32)
CREATININE: 3.22 mg/dL — AB (ref 0.50–1.35)
Calcium: 6.8 mg/dL — ABNORMAL LOW (ref 8.4–10.5)
Chloride: 101 mEq/L (ref 96–112)
GFR calc Af Amer: 21 mL/min — ABNORMAL LOW (ref >90)
GFR calc non Af Amer: 18 mL/min — ABNORMAL LOW (ref >90)
Glucose, Bld: 95 mg/dL (ref 70–99)
POTASSIUM: 4.1 meq/L (ref 3.7–5.3)
Phosphorus: 3 mg/dL (ref 2.3–4.6)
Sodium: 138 mEq/L (ref 137–147)

## 2013-11-17 LAB — CBC WITH DIFFERENTIAL/PLATELET
Basophils Absolute: 0 10*3/uL (ref 0.0–0.1)
Basophils Relative: 0 % (ref 0–1)
Eosinophils Absolute: 0.1 10*3/uL (ref 0.0–0.7)
Eosinophils Relative: 4 % (ref 0–5)
HCT: 26.3 % — ABNORMAL LOW (ref 39.0–52.0)
Hemoglobin: 8.9 g/dL — ABNORMAL LOW (ref 13.0–17.0)
LYMPHS ABS: 0.7 10*3/uL (ref 0.7–4.0)
Lymphocytes Relative: 26 % (ref 12–46)
MCH: 32.5 pg (ref 26.0–34.0)
MCHC: 33.8 g/dL (ref 30.0–36.0)
MCV: 96 fL (ref 78.0–100.0)
Monocytes Absolute: 0.4 10*3/uL (ref 0.1–1.0)
Monocytes Relative: 15 % — ABNORMAL HIGH (ref 3–12)
Neutro Abs: 1.5 10*3/uL — ABNORMAL LOW (ref 1.7–7.7)
Neutrophils Relative %: 55 % (ref 43–77)
PLATELETS: 69 10*3/uL — AB (ref 150–400)
RBC: 2.74 MIL/uL — ABNORMAL LOW (ref 4.22–5.81)
RDW: 21.8 % — AB (ref 11.5–15.5)
Smear Review: DECREASED
WBC: 2.7 10*3/uL — AB (ref 4.0–10.5)

## 2013-11-17 MED ORDER — BOOST / RESOURCE BREEZE PO LIQD: 1.0000 | Freq: Every day | ORAL | Status: DC

## 2013-11-17 MED ORDER — TAMSULOSIN HCL 0.4 MG PO CAPS: 0.4000 mg | ORAL_CAPSULE | Freq: Every day | ORAL | Status: DC

## 2013-11-17 MED ORDER — FOLIC ACID 1 MG PO TABS: 1.0000 mg | ORAL_TABLET | Freq: Every day | ORAL | Status: DC

## 2013-11-17 MED ORDER — CALCIUM CARBONATE ANTACID 500 MG PO CHEW: 1.0000 | CHEWABLE_TABLET | Freq: Three times a day (TID) | ORAL | Status: DC

## 2013-11-17 MED ORDER — SODIUM BICARBONATE 650 MG PO TABS: 650.0000 mg | ORAL_TABLET | Freq: Two times a day (BID) | ORAL | Status: DC

## 2013-11-17 MED ORDER — ASPIRIN EC 81 MG PO TBEC: 81.0000 mg | DELAYED_RELEASE_TABLET | Freq: Every day | ORAL | Status: DC

## 2013-11-17 NOTE — Telephone Encounter (Signed)
Pt called from in hospital asking if he is going home today. Researched chart and called him back that it looks like they are working on it.

## 2013-11-17 NOTE — Progress Notes (Signed)
Re-schedule pre-visit lab appointment to Friday 11-20-2013

## 2013-11-17 NOTE — Progress Notes (Signed)
NUTRITION FOLLOW-UP  DOCUMENTATION CODES Per approved criteria  -Not Applicable   INTERVENTION: Continue Resource Breeze po daily, each supplement provides 250 kcal and 9 grams of protein. Liberalize diet to Regular - obtained verbal consent for Dr. Sloan Leiter. RD to follow for nutrition care plan.  NUTRITION DIAGNOSIS: Increased nutrient needs related to catabolic illness as evidenced by estimated nutrition needs. Ongoing.  Goal: Pt to meet >/= 90% of their estimated nutrition needs - improving.  Monitor:  PO & supplemental intake, weight, labs, I/O's  ASSESSMENT: 73 year old male patient with multiple myeloma status post stem cell transplant January 2014 undergoing chemotherapy. Pt went to ER after having a syncopal episode in the bathroom with subsequent altered mentation. On arrival he was noted to have a rectal temperature of 104.2. Additional history --- patient has been experiencing severe anemia related to his ongoing chemotherapy and has required blood transfusions as recently as 2 weeks prior.   Renal ultrasound negative for obstruction. Receiving IVIG x 3 days for MM.  Fever work-up ongoing, negative thus far. Most recently, afebrile at 98.1 degrees F.  Ordered for Renal diet with 1200 ml fluid restriction. Eating 50-75% of meals. Pt confirms that appetite is fair, feels limited by diet choices. States that his foods are bland. Asking if he needs to follow renal restrictions while at home. Potassium and phosphorus are WNL. States that he likes the [employer].  Height: Ht Readings from Last 1 Encounters:  11/10/13 5' 9"  (1.753 m)    Weight: Wt Readings from Last 1 Encounters:  11/15/13 148 lb 9.4 oz (67.4 kg)  Admit wt 152 lb  BMI:  Body mass index is 21.93 kg/(m^2). WNL  Estimated Nutritional Needs: Kcal: 2000-2200 Protein: 100-110 gm Fluid: approx 2 liters daily  Skin: Intact  Diet Order: Renal W/1200 ml fluid restriction  EDUCATION NEEDS: -No  education needs identified at this time   Intake/Output Summary (Last 24 hours) at 11/17/13 0821 Last data filed at 11/16/13 2254  Gross per 24 hour  Intake   1203 ml  Output    850 ml  Net    353 ml    Last BM: 3/30  Labs:   Recent Labs Lab 11/14/13 0306 11/15/13 0340 11/16/13 0650  NA 135* 137 136*  K 3.3* 3.6* 4.2  CL 99 96 101  CO2 18* 25 20  BUN 52* 51* 49*  CREATININE 3.82* 3.40* 3.29*  CALCIUM 6.1* 6.3* 6.6*  PHOS 3.0 2.5 2.7  GLUCOSE 137* 114* 87    Scheduled Meds: . acyclovir  400 mg Oral Daily  . allopurinol  100 mg Oral Daily  . aspirin EC  325 mg Oral Daily  . brimonidine  1 drop Both Eyes BID  . calcium carbonate  1 tablet Oral TID  . docusate sodium  100 mg Oral BID  . feeding supplement (RESOURCE BREEZE)  1 Container Oral QPC supper  . folic acid  1 mg Oral Daily  . latanoprost  1 drop Both Eyes QHS  . pantoprazole  40 mg Oral Daily  . sodium bicarbonate  650 mg Oral BID  . sodium chloride  3 mL Intravenous Q12H  . tamsulosin  0.4 mg Oral Daily  . timolol  1 drop Both Eyes BID    Continuous Infusions:     Inda Coke MS, RD, LDN Inpatient Registered Dietitian Pager: [phone] After-hours pager: [phone]

## 2013-11-17 NOTE — Progress Notes (Signed)
Felling much better and asking if he coud go home [facility] would like to arrange discharge BP is stable, pt is non-oliguric Exam is stable Renal function continues to slowly improve after peaking at 3.8 (3.22 today) and we have attributed the AKI to febrile syndrome with associated hypotension (creatinine was 0.9 on 3/17)   Recent Labs  11/16/13 0650 11/17/13 0650  NA 136* 138  K 4.2 4.1  CL 101 101  CO2 20 23  GLUCOSE 87 95  BUN 49* 44*  CREATININE 3.29* 3.22*  CALCIUM 6.6* 6.8*  PHOS 2.7 3.0   I think he could be discharged if his oncologist is willing to followup renal function some time this week and then see pt in followup next week. He should stay on the small dose of sodium bicarb orally for now, as well as the oral calcium supplement, pending his followup labs Assuming return of renal function to March 17th baseline, should not require outpt nephrology followup.  Lucrezia Starch, MD

## 2013-11-17 NOTE — Telephone Encounter (Signed)
lvm for pt regarding to April lab moved to 4.3 and est on 4.6 per pt request

## 2013-11-17 NOTE — Progress Notes (Signed)
Trevor Michael discharged Home with wife & H/H RN & PT per MD order.  Discharge instructions reviewed and discussed with the patient, all questions and concerns answered. Copy of instructions and scripts given to patient.    Medication List    STOP taking these medications       atenolol-chlorthalidone 50-25 MG per tablet  Commonly known as:  TENORETIC     dexamethasone 4 MG tablet  Commonly known as:  DECADRON     potassium chloride SA 20 MEQ tablet  Commonly known as:  K-DUR,KLOR-CON     PRESCRIPTION MEDICATION     prochlorperazine 10 MG tablet  Commonly known as:  COMPAZINE     REVLIMID 10 MG capsule  Generic drug:  lenalidomide      TAKE these medications       acetaminophen 500 MG tablet  Commonly known as:  TYLENOL  Take 1,000 mg by mouth every 6 (six) hours as needed. For pain     acyclovir 400 MG tablet  Commonly known as:  ZOVIRAX  Take 400 mg by mouth 2 (two) times daily.     allopurinol 100 MG tablet  Commonly known as:  ZYLOPRIM  Take 100 mg by mouth daily.     aspirin EC 81 MG tablet  Take 1 tablet (81 mg total) by mouth daily.     bimatoprost 0.03 % ophthalmic solution  Commonly known as:  LUMIGAN  Place 1 drop into both eyes at bedtime.     calcium carbonate 500 MG chewable tablet  Commonly known as:  TUMS - dosed in mg elemental calcium  Chew 1 tablet (200 mg of elemental calcium total) by mouth 3 (three) times daily.     COMBIGAN 0.2-0.5 % ophthalmic solution  Generic drug:  brimonidine-timolol  Place 1 drop into both eyes 2 (two) times daily.     esomeprazole 40 MG capsule  Commonly known as:  NEXIUM  Take 40 mg by mouth daily as needed (acid reflux).     feeding supplement (RESOURCE BREEZE) Liqd  Take 1 Container by mouth daily after supper.     folic acid 1 MG tablet  Commonly known as:  FOLVITE  Take 1 tablet (1 mg total) by mouth daily.     loperamide 2 MG capsule  Commonly known as:  IMODIUM  Take 2 mg by mouth as needed for  diarrhea or loose stools.     loratadine 10 MG tablet  Commonly known as:  CLARITIN  Take 10 mg by mouth daily as needed for allergies.     LORazepam 1 MG tablet  Commonly known as:  ATIVAN  Take 0.5 mg by mouth every 8 (eight) hours as needed for anxiety or sleep (nausea).     senna 8.6 MG Tabs tablet  Commonly known as:  SENOKOT  Take 1-2 tablets by mouth daily as needed for mild constipation.     simvastatin 20 MG tablet  Commonly known as:  ZOCOR  Take 20 mg by mouth at bedtime.     sodium bicarbonate 650 MG tablet  Take 1 tablet (650 mg total) by mouth 2 (two) times daily.     tamsulosin 0.4 MG Caps capsule  Commonly known as:  FLOMAX  Take 1 capsule (0.4 mg total) by mouth daily.        Patients skin is clean, dry and intact, no evidence of skin break down. IV site discontinued and catheter remains intact. Site without signs and symptoms of complications.  Dressing and pressure applied.  Patient escorted to car by NT in a wheelchair,  no distress noted upon discharge.  Trevor Michael, Trevor Michael 11/17/2013 5:29 PM

## 2013-11-17 NOTE — Discharge Summary (Signed)
PATIENT DETAILS Name: Trevor Michael Age: 73 y.o. Sex: male Date of Birth: July 30, 1941 MRN: [id number]. Admit Date: 11/09/2013 Admitting Physician: Toy Baker, MD [id number] Norins, MD  Recommendations for Outpatient Follow-up:  1. Please repeat chemistry panel and CBC later this week 2. Resume antihypertensives in the next 2 days. Blood pressures slowly starting to increase.  PRIMARY DISCHARGE DIAGNOSIS:  Active Problems:   HYPERTENSION, MILD   BENIGN PROSTATIC HYPERTROPHY   Multiple myeloma   Fever   Syncope   Dehydration   Acute renal failure   Acute hemolysis-non immune   Nausea & vomiting   Pancytopenia due to chemotherapy   Pulmonary edema   Elevated troponin      PAST MEDICAL HISTORY: Past Medical History  Diagnosis Date  . Other and unspecified hyperlipidemia   . Essential hypertension, benign   . BPH (benign prostatic hyperplasia)   . Early stage glaucoma   . Heart murmur     "since my teens" (06/18/2012)  . Cervical stenosis of spine   . GERD (gastroesophageal reflux disease)   . Bone cancer     multiple bone lesion/right humerus  . Spinal stenosis   . Chronic kidney disease     renal insufficiency  . Arthritis   . DDD (degenerative disc disease)     c-5=-c6, and spondylosis  . Cancer 05/2012    IgA lambda multiple myeloma  . Anorexia 06/25/12    1 month hx   . Diverticulitis   . History of chemotherapy 07/01/12    Velcade,Cytoxin and decadron    DISCHARGE MEDICATIONS:   Medication List    STOP taking these medications       atenolol-chlorthalidone 50-25 MG per tablet  Commonly known as:  TENORETIC     dexamethasone 4 MG tablet  Commonly known as:  DECADRON     potassium chloride SA 20 MEQ tablet  Commonly known as:  K-DUR,KLOR-CON     PRESCRIPTION MEDICATION     prochlorperazine 10 MG tablet  Commonly known as:  COMPAZINE     REVLIMID 10 MG capsule  Generic drug:  lenalidomide      TAKE these medications       acetaminophen 500 MG tablet  Commonly known as:  TYLENOL  Take 1,000 mg by mouth every 6 (six) hours as needed. For pain     acyclovir 400 MG tablet  Commonly known as:  ZOVIRAX  Take 400 mg by mouth 2 (two) times daily.     allopurinol 100 MG tablet  Commonly known as:  ZYLOPRIM  Take 100 mg by mouth daily.     aspirin EC 81 MG tablet  Take 1 tablet (81 mg total) by mouth daily.     bimatoprost 0.03 % ophthalmic solution  Commonly known as:  LUMIGAN  Place 1 drop into both eyes at bedtime.     calcium carbonate 500 MG chewable tablet  Commonly known as:  TUMS - dosed in mg elemental calcium  Chew 1 tablet (200 mg of elemental calcium total) by mouth 3 (three) times daily.     COMBIGAN 0.2-0.5 % ophthalmic solution  Generic drug:  brimonidine-timolol  Place 1 drop into both eyes 2 (two) times daily.     esomeprazole 40 MG capsule  Commonly known as:  NEXIUM  Take 40 mg by mouth daily as needed (acid reflux).     feeding supplement (RESOURCE BREEZE) Liqd  Take 1 Container by mouth daily after supper.     folic acid  1 MG tablet  Commonly known as:  FOLVITE  Take 1 tablet (1 mg total) by mouth daily.     loperamide 2 MG capsule  Commonly known as:  IMODIUM  Take 2 mg by mouth as needed for diarrhea or loose stools.     loratadine 10 MG tablet  Commonly known as:  CLARITIN  Take 10 mg by mouth daily as needed for allergies.     LORazepam 1 MG tablet  Commonly known as:  ATIVAN  Take 0.5 mg by mouth every 8 (eight) hours as needed for anxiety or sleep (nausea).     senna 8.6 MG Tabs tablet  Commonly known as:  SENOKOT  Take 1-2 tablets by mouth daily as needed for mild constipation.     simvastatin 20 MG tablet  Commonly known as:  ZOCOR  Take 20 mg by mouth at bedtime.     sodium bicarbonate 650 MG tablet  Take 1 tablet (650 mg total) by mouth 2 (two) times daily.     tamsulosin 0.4 MG Caps capsule  Commonly known as:  FLOMAX  Take 1 capsule (0.4 mg total)  by mouth daily.        ALLERGIES:  No Known Allergies  BRIEF HPI:  See H&P, Labs, Consult and Test reports for all details in brief, 73 year old male patient with multiple myeloma status post stem cell transplant January 2014 undergoing chemotherapy presented to the hospital after having a syncopal episode with subsequent altered mental status. Was found to be febrile. Patient was admitted, was started on broad-spectrum antibiotics, however no foci of infection was evident, urine and blood cultures were obtained  CONSULTATIONS:   ID, nephrology and hematology/oncology  PERTINENT RADIOLOGIC STUDIES: X-ray Chest Pa And Lateral   11/10/2013   CLINICAL DATA:  Fever.  EXAM: CHEST  2 VIEW  COMPARISON:  11/09/2013  FINDINGS: The heart size and mediastinal contours are within normal limits. Both lungs are clear. The visualized skeletal structures are unremarkable.  IMPRESSION: No active cardiopulmonary disease.   Electronically Signed   By: Earle Gell M.D.   On: 11/10/2013 13:55   US Renal  11/12/2013   CLINICAL DATA:  Acute renal failure.  Multiple myeloma.  EXAM: RENAL/URINARY TRACT ULTRASOUND COMPLETE  COMPARISON:  CT scan abdomen and pelvis dated 06/18/2012  FINDINGS: Right Kidney:  Length: 13.1 cm. Slight increased echogenicity of the renal parenchyma. Multiple renal cysts. The largest is 6.5 cm on the mid to lower pole. No hydronephrosis or solid mass.  Left Kidney:  Length: 13.2 cm. 5.5 cm cyst on the lateral aspect of the mid left kidney, minimally larger than on the prior CT scan. No hydronephrosis. Slight increased echogenicity of the renal parenchyma.  Bladder:  Foley catheter in place.  The bladder is empty.  IMPRESSION: Echogenic renal parenchyma consistent with renal medical disease. No obstruction. Bilateral renal cysts, stable.   Electronically Signed   By: Rozetta Nunnery M.D.   On: 11/12/2013 16:20   Dg Chest Port 1 View  11/10/2013   CLINICAL DATA:  Shortness of breath.  EXAM: PORTABLE  CHEST - 1 VIEW  COMPARISON:  DG CHEST 2 VIEW dated 11/10/2013; DG CHEST 1V PORT dated 11/09/2013  FINDINGS: Mediastinum and hilar structures are normal. Cardiomegaly. Pulmonary vascularity is normal. No pleural effusion or pneumothorax. Multiple lucencies are noted throughout the thoracic cage. This is consistent with myeloma and/or metastatic disease.  IMPRESSION: 1. No acute cardiopulmonary disease.  Stable cardiomegaly. 2. Innumerable lucencies noted throughout the thoracic  cage consistent with myeloma and/or metastatic disease.   Electronically Signed   By: Marcello Moores  Register   On: 11/10/2013 18:41   Dg Chest Port 1 View  11/09/2013   CLINICAL DATA:  Altered mental status, history of multiple myeloma.  EXAM: PORTABLE CHEST - 1 VIEW  COMPARISON:  DG BONE SURVEY MET dated 07/21/2013  FINDINGS: The cardiac silhouette appears mildly enlarged, slightly more conspicuous than prior examination. Mild interstitial prominence without pleural effusions or focal consolidation. 3 mm nodule again seen in right lung base which may reflect granuloma. No pneumothorax.  Patient is osteopenic. Multiple tiny lytic lesions appear slightly worse though, not tailored for evaluation.  IMPRESSION: Mild cardiomegaly and mild interstitial prominence suggest pulmonary edema without focal consolidation.   Electronically Signed   By: Elon Alas   On: 11/09/2013 23:26     PERTINENT LAB RESULTS: CBC:  Recent Labs  11/16/13 0650 11/17/13 0650  WBC 2.3* 2.7*  HGB 9.7* 8.9*  HCT 28.0* 26.3*  PLT 66* 69*   CMET CMP     Component Value Date/Time   NA 138 11/17/2013 0650   NA 139 11/03/2013 1143   K 4.1 11/17/2013 0650   K 4.5 11/03/2013 1143   CL 101 11/17/2013 0650   CL 102 12/05/2012 0845   CO2 23 11/17/2013 0650   CO2 24 11/03/2013 1143   GLUCOSE 95 11/17/2013 0650   GLUCOSE 102 11/03/2013 1143   GLUCOSE 87 12/05/2012 0845   BUN 44* 11/17/2013 0650   BUN 15.4 11/03/2013 1143   CREATININE 3.22* 11/17/2013 0650    CREATININE 0.9 11/03/2013 1143   CREATININE 1.88* 06/27/2012 1039   CALCIUM 6.8* 11/17/2013 0650   CALCIUM 8.5 11/03/2013 1143   PROT 5.6* 11/16/2013 0650   PROT 6.3* 10/30/2013 1439   ALBUMIN 2.8* 11/17/2013 0650   ALBUMIN 3.4* 10/30/2013 1439   AST 23 11/16/2013 0650   AST 34 10/30/2013 1439   ALT 11 11/16/2013 0650   ALT 19 10/30/2013 1439   ALKPHOS 78 11/16/2013 0650   ALKPHOS 71 10/30/2013 1439   BILITOT 0.8 11/16/2013 0650   BILITOT 0.39 10/30/2013 1439   GFRNONAA 18* 11/17/2013 0650   GFRAA 21* 11/17/2013 0650    GFR Estimated Creatinine Clearance: 19.8 ml/min (by C-G formula based on Cr of 3.22). No results found for this basename: LIPASE, AMYLASE,  in the last 72 hours No results found for this basename: CKTOTAL, CKMB, CKMBINDEX, TROPONINI,  in the last 72 hours No components found with this basename: POCBNP,  No results found for this basename: DDIMER,  in the last 72 hours No results found for this basename: HGBA1C,  in the last 72 hours No results found for this basename: CHOL, HDL, LDLCALC, TRIG, CHOLHDL, LDLDIRECT,  in the last 72 hours No results found for this basename: TSH, T4TOTAL, FREET3, T3FREE, THYROIDAB,  in the last 72 hours  Recent Labs  11/15/13 0340 11/16/13 0650  RETICCTPCT 8.7* 9.8*   Coags: No results found for this basename: PT, INR,  in the last 72 hours Microbiology: Recent Results (from the past 240 hour(s))  CULTURE, BLOOD (ROUTINE X 2)     Status: None   Collection Time    11/09/13 11:00 PM      Result Value Ref Range Status   Specimen Description BLOOD LEFT ARM   Final   Special Requests BOTTLES DRAWN AEROBIC AND ANAEROBIC 10CC EA   Final   Culture  Setup Time     Final  Value: 11/10/2013 03:34     Performed at [facility]   Culture     Final   Value: NO GROWTH 5 DAYS     Performed at [facility]   Report Status 11/16/2013 FINAL   Final  CULTURE, BLOOD (ROUTINE X 2)     Status: None   Collection Time    11/09/13 11:10 PM       Result Value Ref Range Status   Specimen Description BLOOD RIGHT ARM   Final   Special Requests BOTTLES DRAWN AEROBIC ONLY [facility]   Final   Culture  Setup Time     Final   Value: 11/10/2013 03:34     Performed at [facility]   Culture     Final   Value: NO GROWTH 5 DAYS     Performed at [facility]   Report Status 11/16/2013 FINAL   Final  URINE CULTURE     Status: None   Collection Time    11/10/13 10:13 PM      Result Value Ref Range Status   Specimen Description URINE, CATHETERIZED   Final   Special Requests Immunocompromised   Final   Culture  Setup Time     Final   Value: 11/11/2013 00:40     Performed at [facility]     Final   Value: NO GROWTH     Performed at [facility]   Culture     Final   Value: NO GROWTH     Performed at [facility]   Report Status 11/12/2013 FINAL   Final     BRIEF HOSPITAL COURSE:  Brief Summary  73 year old male patient with multiple myeloma status post stem cell transplant January 2014 undergoing chemotherapy presented to the hospital after having a syncopal episode with subsequent altered mental status. Was found to be febrile. Patient was admitted, was started on broad-spectrum antibiotics, however no foci of infection was evident, urine and blood cultures were obtained and have remained negative to date. Hospital course has been complicated by worsening anemia secondary to hemolysis, worsening thrombocytopenia requiring a 3 days course of IVIG and Steroids. Hospital course has also been complicated by development of acute renal failure secondary to ATN. Infectious disease, critical care, oncology and nephrology has been consulted during the patient's hospital stay. Since all cultures continued to be negative, antibiotics have been discontinued on 3/27, patient continues to rapidly improve and remains afebrile. His renal function also has improved significantly.    Hospital course by  problem list:  Sepsis  - Resolved, suspected to be infectious etiology, however blood and urine cultures negative.  - Off antibiotics since 3/27, remains afebrile and clinically much improved.   Fever of unknown origin  - On admission, had a fever of 105.53F. Was started on empiric vancomycin, Zosyn along with Tamiflu. Blood and urine cultures were drawn which were subsequently negative. Numerous chest x-rays done on admission were negative as well. Renal ultrasound was negative for abscess. Infectious disease was consulted during this hospital stay, after 4 days of empiric antibiotic therapy with vancomycin and Zosyn, infectious disease on 3/27 stopped all antibiotics, patient is currently afebrile, clinically improved off antibiotics.   Acute renal failure with metabolic acidosis  - Nephrology was consulted, patient at baseline has normal renal function. At this time, etiology is felt to be acute tubular necrosis in the setting of hypotension. No indication for dialysis at this point. Renal  function improving. Nephrology continues to follow. Creatinine at the time of discharge is down to 3.2, spoke with nephrology-Dr. Lorrene Reid today, patient stable for discharge, we need labs repeated later this week. I have contacted the [facility], they will arrange for followup labs. Nephrology recommending to continue with bicarbonate supplementation on discharge, to also continue with calcium supplementation on discharge.  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 Hematology was consulted during this hospital stay, patient has received a three-day course of IVIG and corticosteroid. He was also transfused 2 units of PRBC on 3/27. Hemoglobin is improved significantly. Continue to monitor periodically, in fact will need repeat labs within a week.  Thrombocytopenia  - Per hematology likely multifactorial from recent infection and  possible side effects of his numerous medication. As noted above, patient has finished a three-day course of IVIG and steroid therapy. Platelet count has slowly improved and currently stable at 68,000.  Continue to monitor periodically, in fact will need repeat labs within a week.  Leukopenia  - Likely secondary to multiple myeloma, ongoing pancytopenia secondary to chemotherapy therapy  - Stable at this point.  - Per hematology There is no role for G-CSF  - Off on broad-spectrum antibiotics, continue prophylactic acyclovir.  - Continue to monitor periodically, in fact will need repeat labs within a week.  Suspected transfusion reaction  - Occurred on 3/24, Resolved with supportive care   Elevated troponin  -Mild elevation without ischemic changes or chest pain and likely related to demand ischemia  - Echocardiogram reveals normal ejection fraction, no wall motion abnormality.  - Continue aspirin but watch platelet count, further workup deferred to the outpatient setting. Not a candidate currently for further invasive testing at this point.   Syncope  - Partly secondary to dehydration, sleep aid, acute renal failure.  - 2-D echocardiogram reveals normal ejection fraction. Suspect no further workup is required.   History of multiple myeloma  -S/p stem cell transplant in 2014, was on salvage chemotherapy w Calfizomib plus dexamethasone- currently on hold. Has followup with oncology in the outpatient setting   HYPERTENSION, MILD  -Blood pressure well controlled and overall has been soft since admission  -At home was on Tenoretic which currently is on hold-will slowly need to resumed in the next few days  BPH  - Stable, continue Flomax.  TODAY-DAY OF DISCHARGE:  Subjective:   Trevor Michael today has no headache,no chest abdominal pain,no new weakness tingling or numbness, feels much better wants to go home today.   Objective:   Blood pressure 159/82, pulse 72, temperature 98.1 F  (36.7 C), temperature source Oral, resp. rate 18, height 5' 9"  (1.753 m), weight 67.4 kg (148 lb 9.4 oz), SpO2 99.00%.  Intake/Output Summary (Last 24 hours) at 11/17/13 1125 Last data filed at 11/17/13 1056  Gross per 24 hour  Intake   1203 ml  Output   1950 ml  Net   -747 ml   Filed Weights   11/10/13 2000 11/13/13 0349 11/15/13 0331  Weight: 67.4 kg (148 lb 9.4 oz) 67.4 kg (148 lb 9.4 oz) 67.4 kg (148 lb 9.4 oz)    Exam Awake Alert, Oriented *3, No new F.N deficits, Normal affect [place].AT,PERRAL Supple Neck,No JVD, No cervical lymphadenopathy appriciated.  Symmetrical Chest wall movement, Good air movement bilaterally, CTAB RRR,No Gallops,Rubs or new Murmurs, No Parasternal Heave +ve B.Sounds, Abd Soft, Non tender, No organomegaly appriciated, No rebound -guarding  or rigidity. No Cyanosis, Clubbing or edema, No new Rash or bruise  DISCHARGE CONDITION: Stable  DISPOSITION: Home  DISCHARGE INSTRUCTIONS:    Activity:  As tolerated   Diet recommendation: Heart Healthy diet   Discharge Orders   Future Appointments Provider Department Dept Phone   11/23/2013 10:00 AM [facility] [phone]   11/23/2013 10:30 AM [facility] Provider [facility] [phone]   Future Orders Complete By Expires   Call MD for:  persistant nausea and vomiting  As directed    Call MD for:  temperature >100.4  As directed    Diet - low sodium heart healthy  As directed    Increase activity slowly  As directed       Follow-up Information   Follow up with Adella Hare, MD In 1 week.   Specialty:  Internal Medicine   Contact information:   [address] [place] [address] [phone]       Follow up with CHISM, DAVID, MD. Schedule an appointment as soon as possible for a visit in 1 week.   Specialty:  Internal Medicine   Contact information:   [place]  [id number] [phone]         Total Time spent on discharge equals 45 minutes.  SignedOren Binet 11/17/2013 11:25 AM

## 2013-11-17 NOTE — Telephone Encounter (Signed)
Hospitalist Dr. Sloan Leiter called asking for patient to be scheduled for lab on this Friday and f/u visit with Dr. Juliann Mule on Monday.  "Patient needs cbc and chemistry he is very sick and the renal provider says labs can't wait until the Monday appointment.  Asked if patient will be able to come to [facility] on Monday and per Dr. Sloan Leiter Trevor Michael will be able to.  F./U visit is currently scheduled for Monday 11-23-2013 with lab before visit.  Will notify providers to change previously scheduled pre-visit lab tests to 11-20-2013 as needed post hospital discharge.

## 2013-11-20 ENCOUNTER — Telehealth: Payer: Self-pay | Admitting: Internal Medicine

## 2013-11-20 ENCOUNTER — Ambulatory Visit (HOSPITAL_BASED_OUTPATIENT_CLINIC_OR_DEPARTMENT_OTHER): Payer: Medicare Other

## 2013-11-20 ENCOUNTER — Telehealth: Payer: Self-pay

## 2013-11-20 VITALS — BP 180/84 | HR 90 | Temp 97.5°F | Resp 16

## 2013-11-20 DIAGNOSIS — C9000 Multiple myeloma not having achieved remission: Secondary | ICD-10-CM

## 2013-11-20 DIAGNOSIS — N179 Acute kidney failure, unspecified: Secondary | ICD-10-CM

## 2013-11-20 LAB — COMPREHENSIVE METABOLIC PANEL (CC13)
ALT: 8 U/L (ref 0–55)
AST: 22 U/L (ref 5–34)
Albumin: 3.2 g/dL — ABNORMAL LOW (ref 3.5–5.0)
Alkaline Phosphatase: 93 U/L (ref 40–150)
Anion Gap: 10 mEq/L (ref 3–11)
BUN: 25.2 mg/dL (ref 7.0–26.0)
CALCIUM: 7.6 mg/dL — AB (ref 8.4–10.4)
CO2: 23 mEq/L (ref 22–29)
Chloride: 105 mEq/L (ref 98–109)
Creatinine: 2.9 mg/dL — ABNORMAL HIGH (ref 0.7–1.3)
GLUCOSE: 80 mg/dL (ref 70–140)
POTASSIUM: 3.6 meq/L (ref 3.5–5.1)
Sodium: 139 mEq/L (ref 136–145)
TOTAL PROTEIN: 6.3 g/dL — AB (ref 6.4–8.3)
Total Bilirubin: 0.76 mg/dL (ref 0.20–1.20)

## 2013-11-20 LAB — CBC WITH DIFFERENTIAL/PLATELET
BASO%: 0.4 % (ref 0.0–2.0)
Basophils Absolute: 0 10*3/uL (ref 0.0–0.1)
EOS%: 8.4 % — ABNORMAL HIGH (ref 0.0–7.0)
Eosinophils Absolute: 0.2 10*3/uL (ref 0.0–0.5)
HCT: 24.4 % — ABNORMAL LOW (ref 38.4–49.9)
HGB: 8.2 g/dL — ABNORMAL LOW (ref 13.0–17.1)
LYMPH%: 8.5 % — AB (ref 14.0–49.0)
MCH: 32.7 pg (ref 27.2–33.4)
MCHC: 33.6 g/dL (ref 32.0–36.0)
MCV: 97.4 fL (ref 79.3–98.0)
MONO#: 0.9 10*3/uL (ref 0.1–0.9)
MONO%: 32.1 % — AB (ref 0.0–14.0)
NEUT#: 1.4 10*3/uL — ABNORMAL LOW (ref 1.5–6.5)
NEUT%: 50.6 % (ref 39.0–75.0)
PLATELETS: 105 10*3/uL — AB (ref 140–400)
RBC: 2.5 10*6/uL — AB (ref 4.20–5.82)
RDW: 22.1 % — ABNORMAL HIGH (ref 11.0–14.6)
WBC: 2.8 10*3/uL — ABNORMAL LOW (ref 4.0–10.3)
lymph#: 0.2 10*3/uL — ABNORMAL LOW (ref 0.9–3.3)

## 2013-11-20 LAB — LACTATE DEHYDROGENASE (CC13): LDH: 623 U/L — ABNORMAL HIGH (ref 125–245)

## 2013-11-20 NOTE — Telephone Encounter (Signed)
Pt came for labs and asked for someone to look at a rash on his side. 2 pictures taken of rash for reference. Rash is over most of chest and lower abd.  Rash is not raised, not hot, is itching off and on. Been present since right before pt left hospital. Pt used lotion and that helped with the itching. S/w Dr Alvy Bimler: 1)elevated BP - pt to restart BP meds at 1/2 tab daily. 2)decreasing CBC - no transfusion required at present. Will recheck CBC on Mon with MD visit. 3) rash- hydrocortisone 1% cream to rash BID, and benadryl 1 tab TID prn itching This information forwarded to Dr Juliann Mule for [facility] appt. Pt expressed understanding and appreciation.

## 2013-11-20 NOTE — Telephone Encounter (Signed)
s.w. pt wife and advised on 4.6.15 lab b4 visit....ok adn aware

## 2013-11-23 ENCOUNTER — Encounter: Payer: Self-pay | Admitting: Internal Medicine

## 2013-11-23 ENCOUNTER — Other Ambulatory Visit: Payer: Medicare Other

## 2013-11-23 ENCOUNTER — Ambulatory Visit (HOSPITAL_BASED_OUTPATIENT_CLINIC_OR_DEPARTMENT_OTHER): Payer: Medicare Other | Admitting: Internal Medicine

## 2013-11-23 ENCOUNTER — Other Ambulatory Visit (HOSPITAL_BASED_OUTPATIENT_CLINIC_OR_DEPARTMENT_OTHER): Payer: Medicare Other

## 2013-11-23 ENCOUNTER — Other Ambulatory Visit: Payer: Self-pay | Admitting: Internal Medicine

## 2013-11-23 ENCOUNTER — Telehealth: Payer: Self-pay | Admitting: Internal Medicine

## 2013-11-23 VITALS — BP 174/58 | HR 76 | Temp 97.9°F | Resp 20 | Ht 69.0 in | Wt 153.8 lb

## 2013-11-23 DIAGNOSIS — R197 Diarrhea, unspecified: Secondary | ICD-10-CM

## 2013-11-23 DIAGNOSIS — E876 Hypokalemia: Secondary | ICD-10-CM

## 2013-11-23 DIAGNOSIS — C9000 Multiple myeloma not having achieved remission: Secondary | ICD-10-CM

## 2013-11-23 DIAGNOSIS — N289 Disorder of kidney and ureter, unspecified: Secondary | ICD-10-CM

## 2013-11-23 DIAGNOSIS — R21 Rash and other nonspecific skin eruption: Secondary | ICD-10-CM

## 2013-11-23 DIAGNOSIS — D72819 Decreased white blood cell count, unspecified: Secondary | ICD-10-CM

## 2013-11-23 LAB — CBC WITH DIFFERENTIAL/PLATELET
BASO%: 0.4 % (ref 0.0–2.0)
Basophils Absolute: 0 10*3/uL (ref 0.0–0.1)
EOS%: 6.9 % (ref 0.0–7.0)
Eosinophils Absolute: 0.2 10*3/uL (ref 0.0–0.5)
HCT: 26.4 % — ABNORMAL LOW (ref 38.4–49.9)
HGB: 9 g/dL — ABNORMAL LOW (ref 13.0–17.1)
LYMPH%: 10.3 % — ABNORMAL LOW (ref 14.0–49.0)
MCH: 33.5 pg — ABNORMAL HIGH (ref 27.2–33.4)
MCHC: 34.2 g/dL (ref 32.0–36.0)
MCV: 98 fL (ref 79.3–98.0)
MONO#: 0.5 10*3/uL (ref 0.1–0.9)
MONO%: 13.2 % (ref 0.0–14.0)
NEUT#: 2.5 10*3/uL (ref 1.5–6.5)
NEUT%: 69.2 % (ref 39.0–75.0)
PLATELETS: 173 10*3/uL (ref 140–400)
RBC: 2.69 10*6/uL — AB (ref 4.20–5.82)
RDW: 21.6 % — ABNORMAL HIGH (ref 11.0–14.6)
WBC: 3.6 10*3/uL — AB (ref 4.0–10.3)
lymph#: 0.4 10*3/uL — ABNORMAL LOW (ref 0.9–3.3)

## 2013-11-23 LAB — BASIC METABOLIC PANEL (CC13)
Anion Gap: 14 mEq/L — ABNORMAL HIGH (ref 3–11)
BUN: 24.8 mg/dL (ref 7.0–26.0)
CALCIUM: 8.4 mg/dL (ref 8.4–10.4)
CO2: 24 mEq/L (ref 22–29)
Chloride: 103 mEq/L (ref 98–109)
Creatinine: 2.7 mg/dL — ABNORMAL HIGH (ref 0.7–1.3)
Glucose: 103 mg/dl (ref 70–140)
Potassium: 3 mEq/L — CL (ref 3.5–5.1)
SODIUM: 140 meq/L (ref 136–145)

## 2013-11-23 LAB — HOLD TUBE, BLOOD BANK

## 2013-11-23 LAB — MAGNESIUM (CC13): MAGNESIUM: 1.8 mg/dL (ref 1.5–2.5)

## 2013-11-23 NOTE — Telephone Encounter (Signed)
gv and printed aptp sched and avs for pt for April.... °

## 2013-11-24 LAB — KAPPA/LAMBDA LIGHT CHAINS
KAPPA FREE LGHT CHN: 0.7 mg/dL (ref 0.33–1.94)
Kappa:Lambda Ratio: 0.04 — ABNORMAL LOW (ref 0.26–1.65)
Lambda Free Lght Chn: 16.6 mg/dL — ABNORMAL HIGH (ref 0.57–2.63)

## 2013-11-24 LAB — SPEP & IFE WITH QIG
ALPHA-2-GLOBULIN: 11.1 % (ref 7.1–11.8)
Albumin ELP: 57.9 % (ref 55.8–66.1)
Alpha-1-Globulin: 7.7 % — ABNORMAL HIGH (ref 2.9–4.9)
BETA GLOBULIN: 4.5 % — AB (ref 4.7–7.2)
Beta 2: 5 % (ref 3.2–6.5)
Gamma Globulin: 13.8 % (ref 11.1–18.8)
IgA: 176 mg/dL (ref 68–379)
IgG (Immunoglobin G), Serum: 911 mg/dL (ref 650–1600)
IgM, Serum: 18 mg/dL — ABNORMAL LOW (ref 41–251)
TOTAL PROTEIN, SERUM ELECTROPHOR: 6 g/dL (ref 6.0–8.3)

## 2013-11-24 NOTE — Progress Notes (Signed)
[place] OFFICE PROGRESS NOTE  Adella Hare, MD [address] [place] [address]  DIAGNOSIS: Multiple myeloma - Plan: CBC with Differential, Basic metabolic panel (Bmet) - [facility], CBC with Differential, Basic metabolic panel (Bmet) - [facility], Lactate dehydrogenase, IFE, Urine (with Tot Prot), Immunofixation interpretive, urine, CANCELED: SPEP & IFE with QIG, CANCELED: Kappa/lambda light chains, CANCELED: Protein, urine, 24 hour  Chief Complaint  Patient presents with  . Multiple Myeloma    CURRENT THERAPY:   Aggressive salvage therapy with carfilzomib/revlimid/dexa started on 10/28/2013.    Multiple myeloma   05/21/2012 Initial Diagnosis Multiple myeloma, ISS stage III.   06/19/2012 Imaging Lytic lesions in the calvaria and right hemipelvis and a possible lesion in the right humerus.    06/20/2012 Imaging CT scan of the right humerus showed a 2.3 cm lucent lesion with probable mild endosteal thinning anteriorly near the bicipital groove.  several small lytic lesions in humeral head, scapula, glenoid, and proximal radius.     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 Plasma cells are lambda ligh chain restricted.    06/24/2012 Pathology Cytogenetics revealed the presence of normal male chormosomes with no observable clonal abnormalities.  Single cell with additional chromosome material on 3q, loss of 13 .  DNA, gain of chromosome 11.    07/01/2012 - 01/02/2013 Chemotherapy Velcade, cytoxan and decadron. doses were increased on 11/06/2012.  Neulasta was started on 08/16/2012 because of leukopenia.    07/10/2012 - 07/28/2012 Radiation Therapy He received XRT to the right humerus 25 Gy in 10 fractions.    12/04/2012 Bone Marrow Biopsy Hypercllular for age with trilineage hematopoiesis in addition to increased number of atypical plasma cells estimated at 24% of all cells in the  aspirate. Lamda light chain restriction. rare circulating plasma cells.   12/04/2012 Pathology Cytogenetics revealed the presence of 2 clonal cell lines. first line with chromosal normal (25%).  2nd line was abnormal, missing a Y chromosome. 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 FISH showed gain of 11.    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  S/p melphalan 140 mg /m2 on 01/27/2013.    06/27/2013 Tumor Marker 24-hour urine yielded 17.1 gram of protein of which 16.9 grams was free lambda light chains.  Beta 2 microglobulin was 5.8. Albumin 3.2. IgG 6,320.    07/07/2013 - 09/01/2013 Chemotherapy Maintenance velcade started.   09/21/2013 Progression Counts decreasing; WBC followed by Plts. Referred to [facility] for recommendations for next therapy.    10/12/2013 Bone Marrow Biopsy Done at [facility].  Per care everywhere, PLASMA CELLS COMPRISE 80% OF A PACKED BONE MARROW.    10/22/2013 Tumor Marker Labs done at [facility].  spep m-spike 0.08, FLC lambda 379.00, ratio 0. 24 hour urine, m-spike 16632, IFE positive for monoclonal lambda.   10/27/2013 -  Chemotherapy Started salvage chemotherapy with carlfozomib plus dexamethasone plus revlimid.    11/09/2013 - 11/17/2013 Hospital Admission Admitted with fever.  Started on broad spectrum antibiotics. Had severe anemia and was give hemoglobin with questionable tranfusion reaction. 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 Discharged off antibiotics.      INTERVAL HISTORY: Trevor Michael 73 y.o. 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  He was last seen by me on 10/30/13.  As noted above he started salvage chemotherapy.  This was delayed  due to hospitalization for fever further complicated by ICU level care due to profound hypotension.  He was discharged with recovering acute kidney injury likely acute tubular necrosis.    Today, he is  accompanied by his sister and brother in law.  He reports improvement in his energy overall.  He notes a rash which started several days ago involving his stomach, lower extremities. It is moderately improved since starting benadryl prn and applying hydrocortisone topical. Only new medications that he report since discharge are aspirin, calcium carbonate, folic acid, sodium bicarb and Flomax. Otherwise, he denies fevers or chills or night sweats. He denies any bleeding episodes.   His weight has decreased with a 15 lb weight lost since his last discharge.  His appetite is good.  MEDICAL HISTORY: Past Medical History  Diagnosis Date  . Other and unspecified hyperlipidemia   . Essential hypertension, benign   . BPH (benign prostatic hyperplasia)   . Early stage glaucoma   . Heart murmur     "since my teens" (06/18/2012)  . Cervical stenosis of spine   . GERD (gastroesophageal reflux disease)   . Bone cancer     multiple bone lesion/right humerus  . Spinal stenosis   . Chronic kidney disease     renal insufficiency  . Arthritis   . DDD (degenerative disc disease)     c-5=-c6, and spondylosis  . Cancer 05/2012    IgA lambda multiple myeloma  . Anorexia 06/25/12    1 month hx   . Diverticulitis   . History of chemotherapy 07/01/12    Velcade,Cytoxin and decadron    INTERIM HISTORY: has HYPERLIPIDEMIA, MILD; HYPERTENSION, MILD; BENIGN PROSTATIC HYPERTROPHY; Routine health maintenance; Rhinitis, non-allergic; Multiple myeloma; Bone cancer; Fever; Syncope; Dehydration; Acute renal failure; Acute hemolysis-non immune; Nausea & vomiting; Pancytopenia due to chemotherapy; Pulmonary edema; and Elevated troponin on his problem list.    ALLERGIES:  has No Known Allergies.  MEDICATIONS: has a current medication list which includes the following prescription(s): acetaminophen, acyclovir, allopurinol, aspirin ec, atenolol-chlorthalidone, bimatoprost, brimonidine-timolol, calcium carbonate,  esomeprazole, feeding supplement (resource breeze), folic acid, loperamide, loratadine, lorazepam, prochlorperazine, senna, simvastatin, sodium bicarbonate, and tamsulosin.  SURGICAL HISTORY:  Past Surgical History  Procedure Laterality Date  . Lipoma excision  07/1986    left shoulder/scapula  . Esophagogastroduodenoscopy  06/20/2012    Procedure: ESOPHAGOGASTRODUODENOSCOPY (EGD);  Surgeon: Beryle Beams, MD;  Location: [facility] ENDOSCOPY;  Service: Endoscopy;  Laterality: N/A;  . Prostate biopsy  2008    benign   PROBLEM LIST:  1. Renal insufficiency. Creatinine clearance on 06/27/2012 was 68 mL/min.  2. A 2.3 cm lytic lesion in the right humerus at risk for pathologic fracture. Radiation treatments were administered to the right humerus 25 Gy in 10 fractions from 07/10/2012 through 07/28/2012.  3. Hypertension.  4. Benign prostate hypertrophy and elevated prostate specific antigen, under the care of Dr. Rana Snare. The patient had a prostate biopsy in 2008 that was benign.  5. Dyslipidemia.  6. Arthritis involving the cervical spine  7. Glaucoma.   REVIEW OF SYSTEMS:   Constitutional: Denies fevers, chills but reports abnormal weight loss since recent hospitalization Eyes: Denies blurriness of vision Ears, nose, mouth, throat, and face: Denies mucositis or sore throat Respiratory: Denies cough, dyspnea or wheezes Cardiovascular: Denies palpitation, chest discomfort or lower extremity swelling Gastrointestinal:  Denies nausea, heartburn or change in bowel habits Skin: Denies abnormal skin rashes Lymphatics: Denies new lymphadenopathy or easy bruising Neurological:Denies numbness, tingling or new weaknesses  Behavioral/Psych: Mood is stable, no new changes  All other systems were reviewed with the patient and are negative.  PHYSICAL EXAMINATION: ECOG PERFORMANCE STATUS: 0 - Asymptomatic  Blood pressure 174/58, pulse 76, temperature 97.9 F (36.6 C), temperature source Oral, resp.  rate 20, height 5' 9"  (1.753 m), weight 153 lb 12.8 oz (69.763 kg), SpO2 97.00%.  GENERAL:alert, no distress and comfortable; thin, chronically ill appearing. SKIN: skin color, texture, turgor are normal, no rashes or significant lesions EYES: normal, Conjunctiva are pink and non-injected, sclera clear OROPHARYNX:no exudate, no erythema and lips, buccal mucosa, and tongue normal  NECK: supple, thyroid normal size, non-tender, without nodularity LYMPH:  no palpable lymphadenopathy in the cervical, axillary or supraclavicular LUNGS: clear to auscultation and percussion with normal breathing effort HEART: regular rate & rhythm and no murmurs and no lower extremity edema ABDOMEN:abdomen soft, non-tender and normal bowel sounds Musculoskeletal:no cyanosis of digits and no clubbing  NEURO: alert & oriented x 3 with fluent speech, no focal motor/sensory deficits  Labs:  Lab Results  Component Value Date   WBC 3.6* 11/23/2013   HGB 9.0* 11/23/2013   HCT 26.4* 11/23/2013   MCV 98.0 11/23/2013   PLT 173 11/23/2013   NEUTROABS 2.5 11/23/2013      Chemistry      Component Value Date/Time   NA 140 11/23/2013 1009   NA 138 11/17/2013 0650   K 3.0* 11/23/2013 1009   K 4.1 11/17/2013 0650   CL 101 11/17/2013 0650   CL 102 12/05/2012 0845   CO2 24 11/23/2013 1009   CO2 23 11/17/2013 0650   BUN 24.8 11/23/2013 1009   BUN 44* 11/17/2013 0650   CREATININE 2.7* 11/23/2013 1009   CREATININE 3.22* 11/17/2013 0650   CREATININE 1.88* 06/27/2012 1039      Component Value Date/Time   CALCIUM 8.4 11/23/2013 1009   CALCIUM 6.8* 11/17/2013 0650   ALKPHOS 93 11/20/2013 1107   ALKPHOS 78 11/16/2013 0650   AST 22 11/20/2013 1107   AST 23 11/16/2013 0650   ALT 8 11/20/2013 1107   ALT 11 11/16/2013 0650   BILITOT 0.76 11/20/2013 1107   BILITOT 0.8 11/16/2013 0650      RADIOGRAPHIC STUDIES: 1. MRI of the cervical spine without IV contrast on 11/07/2009 showed disk degeneration and spondylosis, most severe at C5-C6 where there is mild to  moderate spinal stenosis. There is foraminal encroachment at this level, left greater than right. There were degenerative changes at other levels which were described in detail in the body of the report.  2. Chest x-ray, 2 view, from 11/16/2009 showed no acute findings.  3. CT abdomen and pelvis without IV contrast on 06/18/2012 showed multiple lytic bone lesions highly suspicious for metastatic disease or multiple myeloma. The largest lesion seen with some cortical destruction in the anterior aspect of the iliac bone measured 3 cm. There was no evidence of colitis or diverticulitis. There was no evidence for hydronephrosis or hydroureter. There were bilateral probable renal cysts. There was an enlarged prostate gland with indentation of the urinary bladder base. There was a small right hydrocele.  4. Metastatic bone survey from 06/19/2012 showed lytic lesions in the calvaria and right hemipelvis and a possible lesion in the right humerus. This latter lesion was not specifically mentioned in the report.  5. Nuclear bone scan on 06/19/2012 showed some increased uptake involving the femur and humerus bilaterally, possibly reflecting changes of metabolic bone disease. There was a lack of correlation with the bone  scan and the findings seen on CT and x-rays, raising the possibility of multiple myeloma.  6. Renal ultrasound on 06/19/2012 showed increased echogenicity of the renal parenchyma bilaterally consistent with renal medical disease. There was no evidence for obstruction. There was a slightly enlarged prostate gland.  7. CT scan of the right humerus without IV contrast on 06/20/2012 showed 2.3-cm lucent lesion with probable mild endosteal thinning anteriorly near the bicipital groove. There were several small lytic lesions present within the humeral head, scapula, glenoid, and proximal radius. No pathologic fracture or soft tissue mass was seen. There were a few small nodules within the visualized right  lung, the largest of which was 4 mm in the upper lobe on image 78. 8. Metastatic bone survey (07/22/2011) demonstrates multiple new lytic lesions are noted with innumerable lesions noted throughout the skull, left scapula, left humerus, and both femurs. These findings are indicative of progressive myeloma. 9. Metastatic bone survey (07/21/2013) demonstrates multiple new lytic lesions are noted with innumerable lesions noted  throughout the skull, left scapula, left humerus, and both femurs.  These findings are indicative of progressive myeloma.   PROCEDURES:  1. CT-guided bone marrow aspirate and biopsy was carried out on 06/24/2012 and showed 48% plasma cells.  2. Bone marrow aspirate and biopsy was carried out on 12/04/2012 and showed infiltration with 24% plasma cells.   ASSESSMENT: Trevor Michael 73 y.o. male with a history of Multiple myeloma - Plan: CBC with Differential, Basic metabolic panel (Bmet) - [facility], CBC with Differential, Basic metabolic panel (Bmet) - [facility], Lactate dehydrogenase, IFE, Urine (with Tot Prot), Immunofixation interpretive, urine, CANCELED: SPEP & IFE with QIG, CANCELED: Kappa/lambda light chains, CANCELED: Protein, urine, 24 hour  PLAN:  1. Stage III IgA lambda multiple myeloma S/p auto SCT, with rapid progression while on maitenance velcade. --Mr. Raby is continuing to recover clinically from his recent hospitalization.   As noted above, his bone marrow was packed with multiple myeloma explaining his decrease in counts.  He was seen at [facility] and Dr. Samule Ohm recommended aggressive salvage therapy with carfilzomib/revlimid/dexa. We started chemotherapy on 10/28/2013 as noted below:   --Carfilzomib 20 mg/m2 IV on days 1, 2, 8, 9, 15, 16 every 28 days;  --Lenalidomide 10 mg on day 1-21 of all cycles; (We plan to titrate up 25 mg based on his count recovery);  --Dexamethasone 40 mg for cycles 1-4 weekly .   His MM markers appear to improve.  We will follow them with  each cycle.    He has been off treatment due to recent hospitalization   2. Leukopenia secondary to chemotherapy. --We counseled the patient extensively on neutropenia precautions. However, ANC is   3. Hypokalemia likely secondary to poor po intake. --Patient was provided immodium to help with the diarrhea.  In the setting on improving renal dysfunction.   He will have repeat labs on Friday, 04/10 and 04/14.   4. Rash, improving.  --Appears to be a drug-reaction which is improving.  We will hold calcium carbonate as his low calcium also has improved. Off revlimide secondary to the above. Continue benadryl 25 mg tid and application of 1% hydrocortisone.   5. AKI, slowly improving -- Creatinine was 1.1 on admission.  It spiked as high a 43.88 on 3/27.  It has since trended downwards to 2.7 today. Follow up with renal if not longer improves.   6. Follow-up -- We will schedule a lab follow-up for on Friday, 04/10 and repeat visit on 04/14.  All questions were answered. The patient knows to call the clinic with any problems, questions or concerns. We can certainly see the patient much sooner if necessary.  I spent 25 minutes counseling the patient face to face. The total time spent in the appointment was 40 minutes.    Emmalynn Pinkham, MD 11/24/2013 11:32 PM

## 2013-11-27 ENCOUNTER — Other Ambulatory Visit (HOSPITAL_BASED_OUTPATIENT_CLINIC_OR_DEPARTMENT_OTHER): Payer: Medicare Other

## 2013-11-27 DIAGNOSIS — C9000 Multiple myeloma not having achieved remission: Secondary | ICD-10-CM

## 2013-11-27 LAB — CBC WITH DIFFERENTIAL/PLATELET
BASO%: 0.8 % (ref 0.0–2.0)
BASOS ABS: 0 10*3/uL (ref 0.0–0.1)
EOS%: 12.8 % — ABNORMAL HIGH (ref 0.0–7.0)
Eosinophils Absolute: 0.5 10*3/uL (ref 0.0–0.5)
HEMATOCRIT: 27.3 % — AB (ref 38.4–49.9)
HEMOGLOBIN: 9.1 g/dL — AB (ref 13.0–17.1)
LYMPH#: 0.6 10*3/uL — AB (ref 0.9–3.3)
LYMPH%: 13.5 % — AB (ref 14.0–49.0)
MCH: 33 pg (ref 27.2–33.4)
MCHC: 33.3 g/dL (ref 32.0–36.0)
MCV: 99.1 fL — ABNORMAL HIGH (ref 79.3–98.0)
MONO#: 0.5 10*3/uL (ref 0.1–0.9)
MONO%: 11.7 % (ref 0.0–14.0)
NEUT#: 2.6 10*3/uL (ref 1.5–6.5)
NEUT%: 61.2 % (ref 39.0–75.0)
PLATELETS: 199 10*3/uL (ref 140–400)
RBC: 2.75 10*6/uL — ABNORMAL LOW (ref 4.20–5.82)
RDW: 22 % — ABNORMAL HIGH (ref 11.0–14.6)
WBC: 4.2 10*3/uL (ref 4.0–10.3)

## 2013-11-27 LAB — BASIC METABOLIC PANEL (CC13)
Anion Gap: 11 mEq/L (ref 3–11)
BUN: 25.3 mg/dL (ref 7.0–26.0)
CALCIUM: 7.9 mg/dL — AB (ref 8.4–10.4)
CO2: 25 meq/L (ref 22–29)
CREATININE: 2.2 mg/dL — AB (ref 0.7–1.3)
Chloride: 106 mEq/L (ref 98–109)
Glucose: 97 mg/dl (ref 70–140)
Potassium: 3.7 mEq/L (ref 3.5–5.1)
Sodium: 142 mEq/L (ref 136–145)

## 2013-12-02 ENCOUNTER — Encounter: Payer: Self-pay | Admitting: Medical Oncology

## 2013-12-02 ENCOUNTER — Other Ambulatory Visit (HOSPITAL_BASED_OUTPATIENT_CLINIC_OR_DEPARTMENT_OTHER): Payer: Medicare Other

## 2013-12-02 ENCOUNTER — Ambulatory Visit (HOSPITAL_BASED_OUTPATIENT_CLINIC_OR_DEPARTMENT_OTHER): Payer: Medicare Other | Admitting: Internal Medicine

## 2013-12-02 ENCOUNTER — Other Ambulatory Visit: Payer: Self-pay | Admitting: Internal Medicine

## 2013-12-02 ENCOUNTER — Telehealth: Payer: Self-pay | Admitting: Internal Medicine

## 2013-12-02 ENCOUNTER — Encounter: Payer: Self-pay | Admitting: Internal Medicine

## 2013-12-02 VITALS — BP 154/67 | HR 71 | Temp 97.9°F | Resp 18 | Ht 69.0 in | Wt 152.2 lb

## 2013-12-02 DIAGNOSIS — D61810 Antineoplastic chemotherapy induced pancytopenia: Secondary | ICD-10-CM

## 2013-12-02 DIAGNOSIS — E860 Dehydration: Secondary | ICD-10-CM

## 2013-12-02 DIAGNOSIS — C9000 Multiple myeloma not having achieved remission: Secondary | ICD-10-CM

## 2013-12-02 DIAGNOSIS — IMO0002 Reserved for concepts with insufficient information to code with codable children: Secondary | ICD-10-CM

## 2013-12-02 DIAGNOSIS — E876 Hypokalemia: Secondary | ICD-10-CM

## 2013-12-02 DIAGNOSIS — R634 Abnormal weight loss: Secondary | ICD-10-CM

## 2013-12-02 DIAGNOSIS — N179 Acute kidney failure, unspecified: Secondary | ICD-10-CM

## 2013-12-02 DIAGNOSIS — N289 Disorder of kidney and ureter, unspecified: Secondary | ICD-10-CM

## 2013-12-02 LAB — BASIC METABOLIC PANEL (CC13)
Anion Gap: 12 mEq/L — ABNORMAL HIGH (ref 3–11)
BUN: 28 mg/dL — ABNORMAL HIGH (ref 7.0–26.0)
CALCIUM: 8.4 mg/dL (ref 8.4–10.4)
CO2: 25 mEq/L (ref 22–29)
Chloride: 104 mEq/L (ref 98–109)
Creatinine: 1.7 mg/dL — ABNORMAL HIGH (ref 0.7–1.3)
GLUCOSE: 66 mg/dL — AB (ref 70–140)
Potassium: 3.2 mEq/L — ABNORMAL LOW (ref 3.5–5.1)
Sodium: 140 mEq/L (ref 136–145)

## 2013-12-02 LAB — CBC WITH DIFFERENTIAL/PLATELET
BASO%: 0.9 % (ref 0.0–2.0)
Basophils Absolute: 0 10*3/uL (ref 0.0–0.1)
EOS%: 3.8 % (ref 0.0–7.0)
Eosinophils Absolute: 0.1 10*3/uL (ref 0.0–0.5)
HCT: 28.5 % — ABNORMAL LOW (ref 38.4–49.9)
HGB: 9.6 g/dL — ABNORMAL LOW (ref 13.0–17.1)
LYMPH%: 18 % (ref 14.0–49.0)
MCH: 33.4 pg (ref 27.2–33.4)
MCHC: 33.6 g/dL (ref 32.0–36.0)
MCV: 99.4 fL — AB (ref 79.3–98.0)
MONO#: 0.7 10*3/uL (ref 0.1–0.9)
MONO%: 18.9 % — ABNORMAL HIGH (ref 0.0–14.0)
NEUT#: 2.1 10*3/uL (ref 1.5–6.5)
NEUT%: 58.4 % (ref 39.0–75.0)
PLATELETS: 190 10*3/uL (ref 140–400)
RBC: 2.86 10*6/uL — AB (ref 4.20–5.82)
RDW: 21.9 % — ABNORMAL HIGH (ref 11.0–14.6)
WBC: 3.6 10*3/uL — ABNORMAL LOW (ref 4.0–10.3)
lymph#: 0.7 10*3/uL — ABNORMAL LOW (ref 0.9–3.3)

## 2013-12-02 LAB — LACTATE DEHYDROGENASE (CC13): LDH: 375 U/L — ABNORMAL HIGH (ref 125–245)

## 2013-12-02 NOTE — Progress Notes (Signed)
[place] OFFICE PROGRESS NOTE  Adella Hare, MD [address] [place] [address]  DIAGNOSIS: Multiple myeloma - Plan: CBC with Differential, Comprehensive metabolic panel (Cmet) - [facility], Lactate dehydrogenase (LDH) - [facility], CBC with Differential, Basic metabolic panel (Bmet) - [facility]  Pancytopenia due to chemotherapy  Acute hemolysis-non immune - Plan: CBC with Differential, Comprehensive metabolic panel (Cmet) - [facility], Lactate dehydrogenase (LDH) - [facility], CBC with Differential, Basic metabolic panel (Bmet) - [facility]  Acute renal failure  Dehydration  Chief Complaint  Patient presents with  . Multiple Myeloma    CURRENT THERAPY:   Aggressive salvage therapy with carfilzomib/revlimid/dexa started on 10/28/2013.    Multiple myeloma   05/21/2012 Initial Diagnosis Multiple myeloma, ISS stage III.   06/19/2012 Imaging Lytic lesions in the calvaria and right hemipelvis and a possible lesion in the right humerus.    06/20/2012 Imaging CT scan of the right humerus showed a 2.3 cm lucent lesion with probable mild endosteal thinning anteriorly near the bicipital groove.  several small lytic lesions in humeral head, scapula, glenoid, and proximal radius.     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 Plasma cells are lambda ligh chain restricted.    06/24/2012 Pathology Cytogenetics revealed the presence of normal male chormosomes with no observable clonal abnormalities.  Single cell with additional chromosome material on 3q, loss of 13 .  DNA, gain of chromosome 11.    07/01/2012 - 01/02/2013 Chemotherapy Velcade, cytoxan and decadron. doses were increased on 11/06/2012.  Neulasta was started on 08/16/2012 because of leukopenia.    07/10/2012 - 07/28/2012 Radiation Therapy He received XRT to the right humerus 25 Gy in 10 fractions.    12/04/2012 Bone Marrow Biopsy Hypercllular for age  with trilineage hematopoiesis in addition to increased number of atypical plasma cells estimated at 24% of all cells in the aspirate. Lamda light chain restriction. rare circulating plasma cells.   12/04/2012 Pathology Cytogenetics revealed the presence of 2 clonal cell lines. first line with chromosal normal (25%).  2nd line was abnormal, missing a Y chromosome. 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 FISH showed gain of 11.    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  S/p melphalan 140 mg /m2 on 01/27/2013.    06/27/2013 Tumor Marker 24-hour urine yielded 17.1 gram of protein of which 16.9 grams was free lambda light chains.  Beta 2 microglobulin was 5.8. Albumin 3.2. IgG 6,320.    07/07/2013 - 09/01/2013 Chemotherapy Maintenance velcade started.   09/21/2013 Progression Counts decreasing; WBC followed by Plts. Referred to [facility] for recommendations for next therapy.    10/12/2013 Bone Marrow Biopsy Done at [facility].  Per care everywhere, PLASMA CELLS COMPRISE 80% OF A PACKED BONE MARROW.    10/22/2013 Tumor Marker Labs done at [facility].  spep m-spike 0.08, FLC lambda 379.00, ratio 0. 24 hour urine, m-spike 16632, IFE positive for monoclonal lambda.   10/27/2013 -  Chemotherapy Started salvage chemotherapy with carlfozomib plus dexamethasone plus revlimid.    11/09/2013 - 11/17/2013 Hospital Admission Admitted with fever.  Started on broad spectrum antibiotics. Had severe anemia and was give hemoglobin with questionable tranfusion reaction. 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 Discharged off antibiotics.      INTERVAL HISTORY: Trevor Michael 73 y.o. 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  He was last seen by me on 11/23/13.  As noted above he started salvage chemotherapy.  This was delayed due to recent hospitalization for fever further complicated by ICU level care due to  profound hypotension.  He was discharged with recovering acute kidney injury likely acute tubular necrosis.  Today, he is accompanied by his sister and brother in law.  He reports improvement in his energy overall. He had a rash and started benadryl and local hydrocortisone with complete resolution.  His weight is stable.  He denies bleeding and night sweats.  He is scheduled to see his opthalmalogist later this month.   MEDICAL HISTORY: Past Medical History  Diagnosis Date  . Other and unspecified hyperlipidemia   . Essential hypertension, benign   . BPH (benign prostatic hyperplasia)   . Early stage glaucoma   . Heart murmur     "since my teens" (06/18/2012)  . Cervical stenosis of spine   . GERD (gastroesophageal reflux disease)   . Bone cancer     multiple bone lesion/right humerus  . Spinal stenosis   . Chronic kidney disease     renal insufficiency  . Arthritis   . DDD (degenerative disc disease)     c-5=-c6, and spondylosis  . Cancer 05/2012    IgA lambda multiple myeloma  . Anorexia 06/25/12    1 month hx   . Diverticulitis   . History of chemotherapy 07/01/12    Velcade,Cytoxin and decadron    INTERIM HISTORY: has HYPERLIPIDEMIA, MILD; HYPERTENSION, MILD; BENIGN PROSTATIC HYPERTROPHY; Routine health maintenance; Rhinitis, non-allergic; Multiple myeloma; Bone cancer; Fever; Syncope; Dehydration; Acute renal failure; Acute hemolysis-non immune; Nausea & vomiting; Pancytopenia due to chemotherapy; Pulmonary edema; and Elevated troponin on his problem list.    ALLERGIES:  has No Known Allergies.  MEDICATIONS: has a current medication list which includes the following prescription(s): acetaminophen, acyclovir, allopurinol, aspirin ec, atenolol-chlorthalidone, bimatoprost, brimonidine-timolol, esomeprazole, feeding supplement (resource breeze), folic acid, loperamide, loratadine, lorazepam, potassium chloride sa, prochlorperazine, senna, simvastatin, sodium bicarbonate, and  tamsulosin.  SURGICAL HISTORY:  Past Surgical History  Procedure Laterality Date  . Lipoma excision  07/1986    left shoulder/scapula  . Esophagogastroduodenoscopy  06/20/2012    Procedure: ESOPHAGOGASTRODUODENOSCOPY (EGD);  Surgeon: Beryle Beams, MD;  Location: [facility] ENDOSCOPY;  Service: Endoscopy;  Laterality: N/A;  . Prostate biopsy  2008    benign   PROBLEM LIST:  1. Renal insufficiency. Creatinine clearance on 06/27/2012 was 68 mL/min.  2. A 2.3 cm lytic lesion in the right humerus at risk for pathologic fracture. Radiation treatments were administered to the right humerus 25 Gy in 10 fractions from 07/10/2012 through 07/28/2012.  3. Hypertension.  4. Benign prostate hypertrophy and elevated prostate specific antigen, under the care of Dr. Rana Snare. The patient had a prostate biopsy in 2008 that was benign.  5. Dyslipidemia.  6. Arthritis involving the cervical spine  7. Glaucoma.   REVIEW OF SYSTEMS:   Constitutional: Denies fevers, chills but reports some weight loss since recent hospitalization but it is leveling off Eyes: Denies blurriness of vision Ears, nose, mouth, throat, and face: Denies mucositis or sore throat Respiratory: Denies cough, dyspnea or wheezes Cardiovascular: Denies palpitation, chest discomfort or lower extremity swelling Gastrointestinal:  Denies nausea, heartburn or change in bowel habits Skin: Denies abnormal skin rashes Lymphatics: Denies new lymphadenopathy or easy bruising Neurological:Denies numbness, tingling or new weaknesses Behavioral/Psych: Mood is stable, no new changes  All other systems were reviewed with the patient and are  negative.  PHYSICAL EXAMINATION: ECOG PERFORMANCE STATUS: 0 - Asymptomatic  Blood pressure 154/67, pulse 71, temperature 97.9 F (36.6 C), temperature source Oral, resp. rate 18, height _0  (1.753 m), weight 152 lb 3.2 oz (69.037 kg), SpO2 100.00%.  GENERAL:alert, no distress and comfortable; thin,  chronically ill appearing. SKIN: skin color, texture, turgor are normal, no rashes or significant lesions EYES: normal, Conjunctiva are pink and non-injected, sclera clear OROPHARYNX:no exudate, no erythema and lips, buccal mucosa, and tongue normal  NECK: supple, thyroid normal size, non-tender, without nodularity LYMPH:  no palpable lymphadenopathy in the cervical, axillary or supraclavicular LUNGS: clear to auscultation and percussion with normal breathing effort HEART: regular rate & rhythm and no murmurs and no lower extremity edema ABDOMEN:abdomen soft, non-tender and normal bowel sounds Musculoskeletal:no cyanosis of digits and no clubbing  NEURO: alert & oriented x 3 with fluent speech, no focal motor/sensory deficits  Labs:  Lab Results  Component Value Date   WBC 3.6* 12/02/2013   HGB 9.6* 12/02/2013   HCT 28.5* 12/02/2013   MCV 99.4* 12/02/2013   PLT 190 12/02/2013   NEUTROABS 2.1 12/02/2013      Chemistry      Component Value Date/Time   NA 140 12/02/2013 1252   NA 138 11/17/2013 0650   K 3.2* 12/02/2013 1252   K 4.1 11/17/2013 0650   CL 101 11/17/2013 0650   CL 102 12/05/2012 0845   CO2 25 12/02/2013 1252   CO2 23 11/17/2013 0650   BUN 28.0* 12/02/2013 1252   BUN 44* 11/17/2013 0650   CREATININE 1.7* 12/02/2013 1252   CREATININE 3.22* 11/17/2013 0650   CREATININE 1.88* 06/27/2012 1039      Component Value Date/Time   CALCIUM 8.4 12/02/2013 1252   CALCIUM 6.8* 11/17/2013 0650   ALKPHOS 93 11/20/2013 1107   ALKPHOS 78 11/16/2013 0650   AST 22 11/20/2013 1107   AST 23 11/16/2013 0650   ALT 8 11/20/2013 1107   ALT 11 11/16/2013 0650   BILITOT 0.76 11/20/2013 1107   BILITOT 0.8 11/16/2013 0650     Results for ZABDIEL, DRIPPS (MRN [id number]) as of 12/02/2013 15:22  Ref. Range 11/20/2013 11:07  Albumin ELP Latest Range: 55.8-66.1 % 57.9  COMMENT (PROTEIN ELECTROPHOR) No range found *  Alpha-1-Globulin Latest Range: 2.9-4.9 % 7.7 (H)  Alpha-2-Globulin Latest Range: 7.1-11.8 % 11.1  Beta  Globulin Latest Range: 4.7-7.2 % 4.5 (L)  Beta 2 Latest Range: 3.2-6.5 % 5.0  Gamma Globulin Latest Range: 11.1-18.8 % 13.8  M-SPIKE, % No range found NOT DET  SPE Interp. No range found *  IgG (Immunoglobin G), Serum Latest Range: [id number] mg/dL 911  IgA Latest Range: 68-379 mg/dL 176  IgM, Serum Latest Range: 41-251 mg/dL 18 (L)  Total Protein, Serum Electrophoresis Latest Range: 6.0-8.3 g/dL 6.0  Kappa free light chain Latest Range: 0.33-1.94 mg/dL 0.70  Lambda Free Lght Chn Latest Range: 0.57-2.63 mg/dL 16.60 (H)  Kappa:Lambda Ratio Latest Range: 0.26-1.65  0.04 (L)   RADIOGRAPHIC STUDIES: 1. MRI of the cervical spine without IV contrast on 11/07/2009 showed disk degeneration and spondylosis, most severe at C5-C6 where there is mild to moderate spinal stenosis. There is foraminal encroachment at this level, left greater than right. There were degenerative changes at other levels which were described in detail in the body of the report.  2. Chest x-ray, 2 view, from 11/16/2009 showed no acute findings.  3. CT abdomen and pelvis without IV contrast on 06/18/2012 showed multiple lytic bone  lesions highly suspicious for metastatic disease or multiple myeloma. The largest lesion seen with some cortical destruction in the anterior aspect of the iliac bone measured 3 cm. There was no evidence of colitis or diverticulitis. There was no evidence for hydronephrosis or hydroureter. There were bilateral probable renal cysts. There was an enlarged prostate gland with indentation of the urinary bladder base. There was a small right hydrocele.  4. Metastatic bone survey from 06/19/2012 showed lytic lesions in the calvaria and right hemipelvis and a possible lesion in the right humerus. This latter lesion was not specifically mentioned in the report.  5. Nuclear bone scan on 06/19/2012 showed some increased uptake involving the femur and humerus bilaterally, possibly reflecting changes of metabolic bone  disease. There was a lack of correlation with the bone scan and the findings seen on CT and x-rays, raising the possibility of multiple myeloma.  6. Renal ultrasound on 06/19/2012 showed increased echogenicity of the renal parenchyma bilaterally consistent with renal medical disease. There was no evidence for obstruction. There was a slightly enlarged prostate gland.  7. CT scan of the right humerus without IV contrast on 06/20/2012 showed 2.3-cm lucent lesion with probable mild endosteal thinning anteriorly near the bicipital groove. There were several small lytic lesions present within the humeral head, scapula, glenoid, and proximal radius. No pathologic fracture or soft tissue mass was seen. There were a few small nodules within the visualized right lung, the largest of which was 4 mm in the upper lobe on image 78. 8. Metastatic bone survey (07/22/2011) demonstrates multiple new lytic lesions are noted with innumerable lesions noted throughout the skull, left scapula, left humerus, and both femurs. These findings are indicative of progressive myeloma. 9. Metastatic bone survey (07/21/2013) demonstrates multiple new lytic lesions are noted with innumerable lesions noted throughout the skull, left scapula, left humerus, and both femurs.  These findings are indicative of progressive myeloma.   PROCEDURES:  1. CT-guided bone marrow aspirate and biopsy was carried out on 06/24/2012 and showed 48% plasma cells.  2. Bone marrow aspirate and biopsy was carried out on 12/04/2012 and showed infiltration with 24% plasma cells.   ASSESSMENT: Trevor Michael 73 y.o. male with a history of Multiple myeloma - Plan: CBC with Differential, Comprehensive metabolic panel (Cmet) - [facility], Lactate dehydrogenase (LDH) - [facility], CBC with Differential, Basic metabolic panel (Bmet) - [facility]  Pancytopenia due to chemotherapy  Acute hemolysis-non immune - Plan: CBC with Differential, Comprehensive metabolic panel (Cmet) - [facility],  Lactate dehydrogenase (LDH) - [facility], CBC with Differential, Basic metabolic panel (Bmet) - [facility]  Acute renal failure  Dehydration  PLAN:  1. Stage III IgA lambda multiple myeloma S/p auto SCT, with rapid progression while on maitenance velcade. --Mr. Kerschner is continuing to recover clinically from his recent hospitalization.   As noted above, his bone marrow was packed with multiple myeloma explaining his decrease in counts.  He was seen at [facility] and Dr. Samule Ohm recommended aggressive salvage therapy with carfilzomib/revlimid/dexa. We started chemotherapy on 10/28/2013 as noted below:    --Carfilzomib 20 mg/m2 IV on days 1, 2, 8, 9, 15, 16 every 28 days;   --Lenalidomide 10 mg on day 1-21 of all cycles; (We plan to titrate up 25 mg based on his count recovery);   --Dexamethasone 40 mg for cycles 1-4 weekly .   His MM markers appear to improve with no detectable M-spike.  We will follow them with each cycle.    He has been off treatment  due to recent hospitalization. We hope to restart chemotherapy on 12/09/2013 pending resolution of his acute kidney injury. We will recheck a urine 24 hours for protein.   2. Hypokalemia likely secondary to poor po intake. --Patient was provided immodium to help with the diarrhea.  In the setting on improving renal dysfunction.   Continue oral potassium 40 mEQ daily.   3. Rash, resolved.  --Appears to be a drug-reaction which is improving.  We will hold calcium carbonate as his low calcium also has improved. Off revlimide secondary to the above.   4. AKI, slowly improving -- Creatinine was 1.1 on admission.  It spiked as high a 4.38 on 3/27.  It has since trended downwards to 1.7 today.   5. Weight lost. --He reports weight is stable.  He was encouraged to continue ensure with meals.   6. Follow-up -- We will schedule a lab and then chemotherapy on 12/09/2013  and repeat visit on 04/29.     All questions were answered. The patient knows to call the  clinic with any problems, questions or concerns. We can certainly see the patient much sooner if necessary.  I spent 15 minutes counseling the patient face to face. The total time spent in the appointment was 25 minutes.    Concha Norway, MD 12/02/2013 3:26 PM

## 2013-12-02 NOTE — Telephone Encounter (Signed)
gv and printed appt sched and avs forpt for April....sed added tx. °

## 2013-12-08 ENCOUNTER — Telehealth: Payer: Self-pay | Admitting: Medical Oncology

## 2013-12-08 LAB — UIFE/LIGHT CHAINS/TP QN, 24-HR UR
ALPHA 1 UR: DETECTED — AB
Albumin, U: DETECTED
Alpha 2, Urine: DETECTED — AB
Beta, Urine: DETECTED — AB
Free Kappa Lt Chains,Ur: 1.33 mg/dL (ref 0.14–2.42)
Free Kappa/Lambda Ratio: 0.01 ratio — ABNORMAL LOW (ref 2.04–10.37)
Free Lambda Excretion/Day: 3888 mg/d
Free Lambda Lt Chains,Ur: 216 mg/dL — ABNORMAL HIGH (ref 0.02–0.67)
Free Lt Chn Excr Rate: 23.94 mg/d
Gamma Globulin, Urine: DETECTED — AB
Time: 24 hours
Total Protein, Urine-Ur/day: 3965 mg/d — ABNORMAL HIGH (ref 10–140)
Total Protein, Urine: 220.3 mg/dL
VOLUME, URINE-UPE24: 1800 mL

## 2013-12-08 NOTE — Telephone Encounter (Signed)
Vanessa-nurse with [facility] called stating that when she visited the pt he voiced his concern about getting his IV started. She was asking if the pt would be a candidate for a PICC. I discussed with her a port may be a better line of choice since he may be getting on going treatment. She agreed. I will discuss with Dr.Chism.

## 2013-12-09 ENCOUNTER — Other Ambulatory Visit: Payer: Self-pay | Admitting: Internal Medicine

## 2013-12-09 ENCOUNTER — Other Ambulatory Visit (HOSPITAL_BASED_OUTPATIENT_CLINIC_OR_DEPARTMENT_OTHER): Payer: BLUE CROSS/BLUE SHIELD

## 2013-12-09 ENCOUNTER — Ambulatory Visit (HOSPITAL_BASED_OUTPATIENT_CLINIC_OR_DEPARTMENT_OTHER): Payer: Medicare Other

## 2013-12-09 VITALS — BP 142/80 | HR 69 | Temp 97.8°F

## 2013-12-09 DIAGNOSIS — IMO0002 Reserved for concepts with insufficient information to code with codable children: Secondary | ICD-10-CM

## 2013-12-09 DIAGNOSIS — N189 Chronic kidney disease, unspecified: Secondary | ICD-10-CM

## 2013-12-09 DIAGNOSIS — C9000 Multiple myeloma not having achieved remission: Secondary | ICD-10-CM

## 2013-12-09 DIAGNOSIS — I129 Hypertensive chronic kidney disease with stage 1 through stage 4 chronic kidney disease, or unspecified chronic kidney disease: Secondary | ICD-10-CM

## 2013-12-09 DIAGNOSIS — M4802 Spinal stenosis, cervical region: Secondary | ICD-10-CM

## 2013-12-09 DIAGNOSIS — Z5112 Encounter for antineoplastic immunotherapy: Secondary | ICD-10-CM

## 2013-12-09 LAB — CBC WITH DIFFERENTIAL/PLATELET
BASO%: 0.4 % (ref 0.0–2.0)
BASOS ABS: 0 10*3/uL (ref 0.0–0.1)
EOS%: 2 % (ref 0.0–7.0)
Eosinophils Absolute: 0.1 10*3/uL (ref 0.0–0.5)
HCT: 29.8 % — ABNORMAL LOW (ref 38.4–49.9)
HGB: 10.1 g/dL — ABNORMAL LOW (ref 13.0–17.1)
LYMPH%: 18.9 % (ref 14.0–49.0)
MCH: 33.2 pg (ref 27.2–33.4)
MCHC: 33.9 g/dL (ref 32.0–36.0)
MCV: 98 fL (ref 79.3–98.0)
MONO#: 1 10*3/uL — ABNORMAL HIGH (ref 0.1–0.9)
MONO%: 19.9 % — ABNORMAL HIGH (ref 0.0–14.0)
NEUT#: 2.9 10*3/uL (ref 1.5–6.5)
NEUT%: 58.8 % (ref 39.0–75.0)
Platelets: 196 10*3/uL (ref 140–400)
RBC: 3.04 10*6/uL — ABNORMAL LOW (ref 4.20–5.82)
RDW: 18.4 % — AB (ref 11.0–14.6)
WBC: 5 10*3/uL (ref 4.0–10.3)
lymph#: 0.9 10*3/uL (ref 0.9–3.3)
nRBC: 0 % (ref 0–0)

## 2013-12-09 LAB — COMPREHENSIVE METABOLIC PANEL (CC13)
ALT: 7 U/L (ref 0–55)
ANION GAP: 9 meq/L (ref 3–11)
AST: 23 U/L (ref 5–34)
Albumin: 3.8 g/dL (ref 3.5–5.0)
Alkaline Phosphatase: 244 U/L — ABNORMAL HIGH (ref 40–150)
BUN: 22.5 mg/dL (ref 7.0–26.0)
CO2: 24 meq/L (ref 22–29)
CREATININE: 1.2 mg/dL (ref 0.7–1.3)
Calcium: 8.7 mg/dL (ref 8.4–10.4)
Chloride: 106 mEq/L (ref 98–109)
GLUCOSE: 76 mg/dL (ref 70–140)
Potassium: 3.5 mEq/L (ref 3.5–5.1)
Sodium: 139 mEq/L (ref 136–145)
Total Bilirubin: 0.41 mg/dL (ref 0.20–1.20)
Total Protein: 7 g/dL (ref 6.4–8.3)

## 2013-12-09 LAB — LACTATE DEHYDROGENASE (CC13): LDH: 319 U/L — AB (ref 125–245)

## 2013-12-09 MED ORDER — SODIUM CHLORIDE 0.9 % IV SOLN
Freq: Once | INTRAVENOUS | Status: AC
  Administered 2013-12-09: 12:00:00 via INTRAVENOUS

## 2013-12-09 MED ORDER — DEXAMETHASONE SODIUM PHOSPHATE 10 MG/ML IJ SOLN
INTRAMUSCULAR | Status: AC
  Filled 2013-12-09: qty 1

## 2013-12-09 MED ORDER — DEXTROSE 5 % IV SOLN
20.0000 mg/m2 | Freq: Once | INTRAVENOUS | Status: AC
  Administered 2013-12-09: 38 mg via INTRAVENOUS
  Filled 2013-12-09: qty 19

## 2013-12-09 MED ORDER — ONDANSETRON 8 MG/50ML IVPB (CHCC)
8.0000 mg | Freq: Once | INTRAVENOUS | Status: AC
  Administered 2013-12-09: 8 mg via INTRAVENOUS

## 2013-12-09 MED ORDER — DEXAMETHASONE SODIUM PHOSPHATE 10 MG/ML IJ SOLN
10.0000 mg | Freq: Once | INTRAMUSCULAR | Status: AC
  Administered 2013-12-09: 10 mg via INTRAVENOUS

## 2013-12-09 MED ORDER — ZOLEDRONIC ACID 4 MG/5ML IV CONC: 3.5000 mg | Freq: Once | INTRAVENOUS | Status: DC

## 2013-12-09 MED ORDER — ONDANSETRON 8 MG/NS 50 ML IVPB
INTRAVENOUS | Status: AC
  Filled 2013-12-09: qty 8

## 2013-12-09 NOTE — Patient Instructions (Signed)
[facility] Discharge Instructions for Patients Receiving Chemotherapy  Today you received the following chemotherapy agents kyprolis  To help prevent nausea and vomiting after your treatment, we encourage you to take your nausea medication as directed   If you develop nausea and vomiting that is not controlled by your nausea medication, call the clinic.   BELOW ARE SYMPTOMS THAT SHOULD BE REPORTED IMMEDIATELY:  *FEVER GREATER THAN 100.5 F  *CHILLS WITH OR WITHOUT FEVER  NAUSEA AND VOMITING THAT IS NOT CONTROLLED WITH YOUR NAUSEA MEDICATION  *UNUSUAL SHORTNESS OF BREATH  *UNUSUAL BRUISING OR BLEEDING  TENDERNESS IN MOUTH AND THROAT WITH OR WITHOUT PRESENCE OF ULCERS  *URINARY PROBLEMS  *BOWEL PROBLEMS  UNUSUAL RASH Items with * indicate a potential emergency and should be followed up as soon as possible.  Feel free to call the clinic you have any questions or concerns. The clinic phone number is [phone].  

## 2013-12-10 ENCOUNTER — Ambulatory Visit (HOSPITAL_BASED_OUTPATIENT_CLINIC_OR_DEPARTMENT_OTHER): Payer: Medicare Other

## 2013-12-10 ENCOUNTER — Other Ambulatory Visit: Payer: Self-pay | Admitting: Internal Medicine

## 2013-12-10 ENCOUNTER — Other Ambulatory Visit: Payer: Self-pay

## 2013-12-10 VITALS — BP 131/63 | HR 71 | Temp 97.9°F | Resp 18

## 2013-12-10 DIAGNOSIS — C9000 Multiple myeloma not having achieved remission: Secondary | ICD-10-CM | POA: Diagnosis not present

## 2013-12-10 DIAGNOSIS — IMO0002 Reserved for concepts with insufficient information to code with codable children: Secondary | ICD-10-CM

## 2013-12-10 DIAGNOSIS — Z5112 Encounter for antineoplastic immunotherapy: Secondary | ICD-10-CM | POA: Diagnosis present

## 2013-12-10 MED ORDER — DEXTROSE 5 % IV SOLN
20.0000 mg/m2 | Freq: Once | INTRAVENOUS | Status: AC
  Administered 2013-12-10: 38 mg via INTRAVENOUS
  Filled 2013-12-10: qty 19

## 2013-12-10 MED ORDER — ONDANSETRON 8 MG/NS 50 ML IVPB
INTRAVENOUS | Status: AC
  Filled 2013-12-10: qty 8

## 2013-12-10 MED ORDER — TAMSULOSIN HCL 0.4 MG PO CAPS: 0.4000 mg | ORAL_CAPSULE | Freq: Every day | ORAL | Status: DC

## 2013-12-10 MED ORDER — SODIUM CHLORIDE 0.9 % IV SOLN
Freq: Once | INTRAVENOUS | Status: AC
  Administered 2013-12-10: 12:00:00 via INTRAVENOUS

## 2013-12-10 MED ORDER — ONDANSETRON 8 MG/50ML IVPB (CHCC)
8.0000 mg | Freq: Once | INTRAVENOUS | Status: AC
  Administered 2013-12-10: 8 mg via INTRAVENOUS

## 2013-12-10 MED ORDER — DEXAMETHASONE SODIUM PHOSPHATE 10 MG/ML IJ SOLN
10.0000 mg | Freq: Once | INTRAMUSCULAR | Status: AC
  Administered 2013-12-10: 10 mg via INTRAVENOUS

## 2013-12-10 MED ORDER — POTASSIUM CHLORIDE CRYS ER 20 MEQ PO TBCR: 20.0000 meq | EXTENDED_RELEASE_TABLET | Freq: Two times a day (BID) | ORAL | Status: DC

## 2013-12-10 MED ORDER — DEXAMETHASONE SODIUM PHOSPHATE 10 MG/ML IJ SOLN
INTRAMUSCULAR | Status: AC
  Filled 2013-12-10: qty 1

## 2013-12-10 MED ORDER — FOLIC ACID 1 MG PO TABS: 1.0000 mg | ORAL_TABLET | Freq: Every day | ORAL | Status: DC

## 2013-12-10 MED ORDER — SODIUM BICARBONATE 650 MG PO TABS: 650.0000 mg | ORAL_TABLET | Freq: Two times a day (BID) | ORAL | Status: DC

## 2013-12-10 NOTE — Patient Instructions (Signed)
[facility] Discharge Instructions for Patients Receiving Chemotherapy  Today you received the following chemotherapy agents: Kyprolis  To help prevent nausea and vomiting after your treatment, we encourage you to take your nausea medication: compazine 10mg every 6 hours as needed.   If you develop nausea and vomiting that is not controlled by your nausea medication, call the clinic.   BELOW ARE SYMPTOMS THAT SHOULD BE REPORTED IMMEDIATELY:  *FEVER GREATER THAN 100.5 F  *CHILLS WITH OR WITHOUT FEVER  NAUSEA AND VOMITING THAT IS NOT CONTROLLED WITH YOUR NAUSEA MEDICATION  *UNUSUAL SHORTNESS OF BREATH  *UNUSUAL BRUISING OR BLEEDING  TENDERNESS IN MOUTH AND THROAT WITH OR WITHOUT PRESENCE OF ULCERS  *URINARY PROBLEMS  *BOWEL PROBLEMS  UNUSUAL RASH Items with * indicate a potential emergency and should be followed up as soon as possible.  Feel free to call the clinic you have any questions or concerns. The clinic phone number is [phone].    

## 2013-12-14 ENCOUNTER — Telehealth: Payer: Self-pay | Admitting: Dietician

## 2013-12-14 NOTE — Telephone Encounter (Signed)
Brief Outpatient Oncology Nutrition Note  Patient has been identified to be at risk on malnutrition screen.  Wt Readings from Last 10 Encounters:  12/02/13 152 lb 3.2 oz (69.037 kg)  11/23/13 153 lb 12.8 oz (69.763 kg)  11/15/13 148 lb 9.4 oz (67.4 kg)  10/30/13 168 lb 14.4 oz (76.613 kg)  09/29/13 165 lb 11.2 oz (75.161 kg)  09/09/13 168 lb 8 oz (76.431 kg)  08/04/13 171 lb 4.8 oz (77.701 kg)  07/07/13 168 lb 4.8 oz (76.34 kg)  03/10/13 148 lb 12.8 oz (67.495 kg)  02/19/13 153 lb 12.8 oz (69.763 kg)      Dx:  Multiple Myeloma.  Patient of Dr. Juliann Mule.  Called patient due to weight loss.  Patient was seen by the Inpatient RD 3/15.  Patient currently is unavailable.  Message left with contact information for the [facility].   Antonieta Iba, RD, LDN

## 2013-12-16 ENCOUNTER — Other Ambulatory Visit (HOSPITAL_BASED_OUTPATIENT_CLINIC_OR_DEPARTMENT_OTHER): Payer: Medicare Other

## 2013-12-16 ENCOUNTER — Ambulatory Visit (HOSPITAL_BASED_OUTPATIENT_CLINIC_OR_DEPARTMENT_OTHER): Payer: Medicare Other

## 2013-12-16 ENCOUNTER — Telehealth: Payer: Self-pay | Admitting: Internal Medicine

## 2013-12-16 ENCOUNTER — Ambulatory Visit (HOSPITAL_BASED_OUTPATIENT_CLINIC_OR_DEPARTMENT_OTHER): Payer: Medicare Other | Admitting: Physician Assistant

## 2013-12-16 ENCOUNTER — Other Ambulatory Visit: Payer: Self-pay | Admitting: Internal Medicine

## 2013-12-16 ENCOUNTER — Encounter: Payer: Self-pay | Admitting: Physician Assistant

## 2013-12-16 ENCOUNTER — Encounter: Payer: Self-pay | Admitting: Internal Medicine

## 2013-12-16 VITALS — BP 137/72 | HR 66

## 2013-12-16 VITALS — BP 171/68 | HR 76 | Temp 97.8°F | Resp 18 | Ht 69.0 in | Wt 156.2 lb

## 2013-12-16 DIAGNOSIS — C9000 Multiple myeloma not having achieved remission: Secondary | ICD-10-CM

## 2013-12-16 DIAGNOSIS — N179 Acute kidney failure, unspecified: Secondary | ICD-10-CM

## 2013-12-16 DIAGNOSIS — E876 Hypokalemia: Secondary | ICD-10-CM

## 2013-12-16 DIAGNOSIS — Z5112 Encounter for antineoplastic immunotherapy: Secondary | ICD-10-CM

## 2013-12-16 DIAGNOSIS — IMO0002 Reserved for concepts with insufficient information to code with codable children: Secondary | ICD-10-CM

## 2013-12-16 LAB — BASIC METABOLIC PANEL (CC13)
ANION GAP: 14 meq/L — AB (ref 3–11)
BUN: 20.5 mg/dL (ref 7.0–26.0)
CO2: 23 mEq/L (ref 22–29)
Calcium: 8.5 mg/dL (ref 8.4–10.4)
Chloride: 103 mEq/L (ref 98–109)
Creatinine: 1.3 mg/dL (ref 0.7–1.3)
GLUCOSE: 114 mg/dL (ref 70–140)
POTASSIUM: 3.2 meq/L — AB (ref 3.5–5.1)
Sodium: 140 mEq/L (ref 136–145)

## 2013-12-16 LAB — CBC WITH DIFFERENTIAL/PLATELET
BASO%: 0 % (ref 0.0–2.0)
Basophils Absolute: 0 10*3/uL (ref 0.0–0.1)
EOS ABS: 0.2 10*3/uL (ref 0.0–0.5)
EOS%: 5.2 % (ref 0.0–7.0)
HCT: 30.7 % — ABNORMAL LOW (ref 38.4–49.9)
HEMOGLOBIN: 10.4 g/dL — AB (ref 13.0–17.1)
LYMPH%: 17.2 % (ref 14.0–49.0)
MCH: 33.7 pg — ABNORMAL HIGH (ref 27.2–33.4)
MCHC: 33.9 g/dL (ref 32.0–36.0)
MCV: 99.4 fL — AB (ref 79.3–98.0)
MONO#: 0.7 10*3/uL (ref 0.1–0.9)
MONO%: 23.4 % — ABNORMAL HIGH (ref 0.0–14.0)
NEUT%: 54.2 % (ref 39.0–75.0)
NEUTROS ABS: 1.6 10*3/uL (ref 1.5–6.5)
Platelets: 111 10*3/uL — ABNORMAL LOW (ref 140–400)
RBC: 3.09 10*6/uL — ABNORMAL LOW (ref 4.20–5.82)
RDW: 16.8 % — AB (ref 11.0–14.6)
WBC: 2.9 10*3/uL — AB (ref 4.0–10.3)
lymph#: 0.5 10*3/uL — ABNORMAL LOW (ref 0.9–3.3)

## 2013-12-16 MED ORDER — DEXAMETHASONE SODIUM PHOSPHATE 10 MG/ML IJ SOLN
INTRAMUSCULAR | Status: AC
  Filled 2013-12-16: qty 1

## 2013-12-16 MED ORDER — SODIUM CHLORIDE 0.9 % IV SOLN: Freq: Once | INTRAVENOUS | Status: DC

## 2013-12-16 MED ORDER — DEXAMETHASONE SODIUM PHOSPHATE 10 MG/ML IJ SOLN
10.0000 mg | Freq: Once | INTRAMUSCULAR | Status: AC
  Administered 2013-12-16: 10 mg via INTRAVENOUS

## 2013-12-16 MED ORDER — ONDANSETRON 8 MG/50ML IVPB (CHCC)
8.0000 mg | Freq: Once | INTRAVENOUS | Status: AC
  Administered 2013-12-16: 8 mg via INTRAVENOUS

## 2013-12-16 MED ORDER — DEXTROSE 5 % IV SOLN
20.0000 mg/m2 | Freq: Once | INTRAVENOUS | Status: AC
  Administered 2013-12-16: 38 mg via INTRAVENOUS
  Filled 2013-12-16: qty 19

## 2013-12-16 MED ORDER — SODIUM CHLORIDE 0.9 % IV SOLN
Freq: Once | INTRAVENOUS | Status: AC
  Administered 2013-12-16: 13:00:00 via INTRAVENOUS

## 2013-12-16 MED ORDER — ONDANSETRON 8 MG/NS 50 ML IVPB
INTRAVENOUS | Status: AC
  Filled 2013-12-16: qty 8

## 2013-12-16 NOTE — Telephone Encounter (Signed)
gv and printed appts ched and avs for pt for May/June...sed added tx.

## 2013-12-16 NOTE — Progress Notes (Signed)
[name]  Adella Hare, MD [address] [address]  DIAGNOSIS: Multiple myeloma - Plan: CBC with Differential, Basic metabolic panel (Bmet) - [facility], CBC with Differential, Comprehensive metabolic panel (Cmet) - [facility], SPEP & IFE with QIG, UIFE/TP, 24 hr urine, Lactate dehydrogenase (LDH) - [facility], CBC with Differential, Basic metabolic panel (Bmet) - [facility], CBC with Differential, Basic metabolic panel (Bmet) - [facility]  No chief complaint on file.   CURRENT THERAPY:   Aggressive salvage therapy with carfilzomib/revlimid/dexa started on 10/28/2013. Status post days 1, 2, 8 and 9 of cycle #1.    Multiple myeloma   05/21/2012 Initial Diagnosis Multiple myeloma, ISS stage III.   06/19/2012 Imaging Lytic lesions in the calvaria and right hemipelvis and a possible lesion in the right humerus.    06/20/2012 Imaging CT scan of the right humerus showed a 2.3 cm lucent lesion with probable mild endosteal thinning anteriorly near the bicipital groove.  several small lytic lesions in humeral head, scapula, glenoid, and proximal radius.     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 Plasma cells are lambda ligh chain restricted.    06/24/2012 Pathology Cytogenetics revealed the presence of normal male chormosomes with no observable clonal abnormalities.  Single cell with additional chromosome material on 3q, loss of 13 .  DNA, gain of chromosome 11.    07/01/2012 - 01/02/2013 Chemotherapy Velcade, cytoxan and decadron. doses were increased on 11/06/2012.  Neulasta was started on 08/16/2012 because of leukopenia.    07/10/2012 - 07/28/2012 Radiation Therapy He received XRT to the right humerus 25 Gy in 10 fractions.    12/04/2012 Bone Marrow Biopsy Hypercllular for age with trilineage hematopoiesis in addition to increased number of atypical plasma cells estimated at  24% of all cells in the aspirate. Lamda light chain restriction. rare circulating plasma cells.   12/04/2012 Pathology Cytogenetics revealed the presence of 2 clonal cell lines. first line with chromosal normal (25%).  2nd line was abnormal, missing a Y chromosome. 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 FISH showed gain of 11.    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  S/p melphalan 140 mg /m2 on 01/27/2013.    06/27/2013 Tumor Marker 24-hour urine yielded 17.1 gram of protein of which 16.9 grams was free lambda light chains.  Beta 2 microglobulin was 5.8. Albumin 3.2. IgG 6,320.    07/07/2013 - 09/01/2013 Chemotherapy Maintenance velcade started.   09/21/2013 Progression Counts decreasing; WBC followed by Plts. Referred to [facility] for recommendations for next therapy.    10/12/2013 Bone Marrow Biopsy Done at [facility].  Per care everywhere, PLASMA CELLS COMPRISE 80% OF A PACKED BONE MARROW.    10/22/2013 Tumor Marker Labs done at [facility].  spep m-spike 0.08, FLC lambda 379.00, ratio 0. 24 hour urine, m-spike 16632, IFE positive for monoclonal lambda.   10/27/2013 -  Chemotherapy Started salvage chemotherapy with carlfozomib plus dexamethasone plus revlimid.    11/09/2013 - 11/17/2013 Hospital Admission Admitted with fever.  Started on broad spectrum antibiotics. Had severe anemia and was give hemoglobin with questionable tranfusion reaction. 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 Discharged off antibiotics.      INTERVAL HISTORY: Trevor Michael 73 y.o. 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  He was last seen by Dr. Juliann Mule on  12/02/13.  As noted above he started salvage chemotherapy.  This was delayed due to recent hospitalization for fever further complicated by ICU level care due to profound hypotension.  He was discharged with recovering acute kidney injury likely acute  tubular necrosis.  Today, he is accompanied by his sister and brother in law.  He reports improvement in his energy overall. He reports occasional queasiness without frank nausea and vomiting. He denied any diarrhea or constipation although he states he takes over-the-counter sent a if needed. He reports that his appetite is improved significantly since being discharged from the hospital. He denied any bone pain, significant weight loss or night sweats. He received chemotherapy he on 12/09/2013 and 12/10/2013. As his hospitalization negated days 15 and 16 of cycle #1 last week's chemotherapy represented days 1 and 2 of cycle #2.   MEDICAL HISTORY: Past Medical History  Diagnosis Date  . Other and unspecified hyperlipidemia   . Essential hypertension, benign   . BPH (benign prostatic hyperplasia)   . Early stage glaucoma   . Heart murmur     "since my teens" (06/18/2012)  . Cervical stenosis of spine   . GERD (gastroesophageal reflux disease)   . Bone cancer     multiple bone lesion/right humerus  . Spinal stenosis   . Chronic kidney disease     renal insufficiency  . Arthritis   . DDD (degenerative disc disease)     c-5=-c6, and spondylosis  . Cancer 05/2012    IgA lambda multiple myeloma  . Anorexia 06/25/12    1 month hx   . Diverticulitis   . History of chemotherapy 07/01/12    Velcade,Cytoxin and decadron    INTERIM HISTORY: has HYPERLIPIDEMIA, MILD; HYPERTENSION, MILD; BENIGN PROSTATIC HYPERTROPHY; Routine health maintenance; Rhinitis, non-allergic; Multiple myeloma; Bone cancer; Fever; Syncope; Dehydration; Acute renal failure; Acute hemolysis-non immune; Nausea & vomiting; Pancytopenia due to chemotherapy; Pulmonary edema; and Elevated troponin on his problem list.    ALLERGIES:  has No Known Allergies.  MEDICATIONS: has a current medication list which includes the following prescription(s): acetaminophen, acyclovir, allopurinol, aspirin ec, atenolol-chlorthalidone,  bimatoprost, brimonidine-timolol, dexamethasone, esomeprazole, feeding supplement (resource breeze), folic acid, loratadine, potassium chloride sa, prochlorperazine, revlimid, senna, simvastatin, sodium bicarbonate, tamsulosin, and loperamide, and the following Facility-Administered Medications: sodium chloride.  SURGICAL HISTORY:  Past Surgical History  Procedure Laterality Date  . Lipoma excision  07/1986    left shoulder/scapula  . Esophagogastroduodenoscopy  06/20/2012    Procedure: ESOPHAGOGASTRODUODENOSCOPY (EGD);  Surgeon: Beryle Beams, MD;  Location: [facility] ENDOSCOPY;  Service: Endoscopy;  Laterality: N/A;  . Prostate biopsy  2008    benign   PROBLEM LIST:  1. Renal insufficiency. Creatinine clearance on 06/27/2012 was 68 mL/min.  2. A 2.3 cm lytic lesion in the right humerus at risk for pathologic fracture. Radiation treatments were administered to the right humerus 25 Gy in 10 fractions from 07/10/2012 through 07/28/2012.  3. Hypertension.  4. Benign prostate hypertrophy and elevated prostate specific antigen, under the care of Dr. Rana Snare. The patient had a prostate biopsy in 2008 that was benign.  5. Dyslipidemia.  6. Arthritis involving the cervical spine  7. Glaucoma.   REVIEW OF SYSTEMS:   Constitutional: Denies fevers, chills but reports some weight gain since recent hospitalization  Eyes: Denies blurriness of vision Ears, nose, mouth, throat, and face: Denies mucositis or sore throat Respiratory: Denies cough, dyspnea or wheezes Cardiovascular: Denies palpitation, chest discomfort or lower extremity swelling Gastrointestinal:  Denies nausea, heartburn  or change in bowel habits Skin: Denies abnormal skin rashes Lymphatics: Denies new lymphadenopathy or easy bruising Neurological:Denies numbness, tingling or new weaknesses Behavioral/Psych: Mood is stable, no new changes  All other systems were reviewed with the patient and are negative.  PHYSICAL EXAMINATION: ECOG  PERFORMANCE STATUS: 0 - Asymptomatic  Blood pressure 171/68, pulse 76, temperature 97.8 F (36.6 C), temperature source Oral, resp. rate 18, height 5' 9"  (1.753 m), weight 156 lb 3.2 oz (70.852 kg), SpO2 100.00%.  GENERAL:alert, no distress and comfortable; thin, chronically ill appearing. SKIN: skin color, texture, turgor are normal, no rashes or significant lesions EYES: normal, Conjunctiva are pink and non-injected, sclera clear OROPHARYNX:no exudate, no erythema and lips, buccal mucosa, and tongue normal  NECK: supple, thyroid normal size, non-tender, without nodularity LYMPH:  no palpable lymphadenopathy in the cervical, axillary or supraclavicular LUNGS: clear to auscultation and percussion with normal breathing effort HEART: regular rate & rhythm and no murmurs and no lower extremity edema ABDOMEN:abdomen soft, non-tender and normal bowel sounds Musculoskeletal:no cyanosis of digits and no clubbing  NEURO: alert & oriented x 3 with fluent speech, no focal motor/sensory deficits  Labs:  Lab Results  Component Value Date   WBC 2.9* 12/16/2013   HGB 10.4* 12/16/2013   HCT 30.7* 12/16/2013   MCV 99.4* 12/16/2013   PLT 111* 12/16/2013   NEUTROABS 1.6 12/16/2013      Chemistry      Component Value Date/Time   NA 140 12/16/2013 1029   NA 138 11/17/2013 0650   K 3.2* 12/16/2013 1029   K 4.1 11/17/2013 0650   CL 101 11/17/2013 0650   CL 102 12/05/2012 0845   CO2 23 12/16/2013 1029   CO2 23 11/17/2013 0650   BUN 20.5 12/16/2013 1029   BUN 44* 11/17/2013 0650   CREATININE 1.3 12/16/2013 1029   CREATININE 3.22* 11/17/2013 0650   CREATININE 1.88* 06/27/2012 1039      Component Value Date/Time   CALCIUM 8.5 12/16/2013 1029   CALCIUM 6.8* 11/17/2013 0650   ALKPHOS 244* 12/09/2013 1330   ALKPHOS 78 11/16/2013 0650   AST 23 12/09/2013 1330   AST 23 11/16/2013 0650   ALT 7 12/09/2013 1330   ALT 11 11/16/2013 0650   BILITOT 0.41 12/09/2013 1330   BILITOT 0.8 11/16/2013 0650     Results for ALLIN, FRIX (MRN [id number]) as of 12/02/2013 15:22  Ref. Range 11/20/2013 11:07  Albumin ELP Latest Range: 55.8-66.1 % 57.9  COMMENT (PROTEIN ELECTROPHOR) No range found *  Alpha-1-Globulin Latest Range: 2.9-4.9 % 7.7 (H)  Alpha-2-Globulin Latest Range: 7.1-11.8 % 11.1  Beta Globulin Latest Range: 4.7-7.2 % 4.5 (L)  Beta 2 Latest Range: 3.2-6.5 % 5.0  Gamma Globulin Latest Range: 11.1-18.8 % 13.8  M-SPIKE, % No range found NOT DET  SPE Interp. No range found *  IgG (Immunoglobin G), Serum Latest Range: 301-845-2715 mg/dL 911  IgA Latest Range: 68-379 mg/dL 176  IgM, Serum Latest Range: 41-251 mg/dL 18 (L)  Total Protein, Serum Electrophoresis Latest Range: 6.0-8.3 g/dL 6.0  Kappa free light chain Latest Range: 0.33-1.94 mg/dL 0.70  Lambda Free Lght Chn Latest Range: 0.57-2.63 mg/dL 16.60 (H)  Kappa:Lambda Ratio Latest Range: 0.26-1.65  0.04 (L)   RADIOGRAPHIC STUDIES: 1. MRI of the cervical spine without IV contrast on 11/07/2009 showed disk degeneration and spondylosis, most severe at C5-C6 where there is mild to moderate spinal stenosis. There is foraminal encroachment at this level, left greater than right. There were degenerative changes  at other levels which were described in detail in the body of the report.  2. Chest x-ray, 2 view, from 11/16/2009 showed no acute findings.  3. CT abdomen and pelvis without IV contrast on 06/18/2012 showed multiple lytic bone lesions highly suspicious for metastatic disease or multiple myeloma. The largest lesion seen with some cortical destruction in the anterior aspect of the iliac bone measured 3 cm. There was no evidence of colitis or diverticulitis. There was no evidence for hydronephrosis or hydroureter. There were bilateral probable renal cysts. There was an enlarged prostate gland with indentation of the urinary bladder base. There was a small right hydrocele.  4. Metastatic bone survey from 06/19/2012 showed lytic lesions in the calvaria and right  hemipelvis and a possible lesion in the right humerus. This latter lesion was not specifically mentioned in the report.  5. Nuclear bone scan on 06/19/2012 showed some increased uptake involving the femur and humerus bilaterally, possibly reflecting changes of metabolic bone disease. There was a lack of correlation with the bone scan and the findings seen on CT and x-rays, raising the possibility of multiple myeloma.  6. Renal ultrasound on 06/19/2012 showed increased echogenicity of the renal parenchyma bilaterally consistent with renal medical disease. There was no evidence for obstruction. There was a slightly enlarged prostate gland.  7. CT scan of the right humerus without IV contrast on 06/20/2012 showed 2.3-cm lucent lesion with probable mild endosteal thinning anteriorly near the bicipital groove. There were several small lytic lesions present within the humeral head, scapula, glenoid, and proximal radius. No pathologic fracture or soft tissue mass was seen. There were a few small nodules within the visualized right lung, the largest of which was 4 mm in the upper lobe on image 78. 8. Metastatic bone survey (07/22/2011) demonstrates multiple new lytic lesions are noted with innumerable lesions noted throughout the skull, left scapula, left humerus, and both femurs. These findings are indicative of progressive myeloma. 9. Metastatic bone survey (07/21/2013) demonstrates multiple new lytic lesions are noted with innumerable lesions noted throughout the skull, left scapula, left humerus, and both femurs.  These findings are indicative of progressive myeloma.   PROCEDURES:  1. CT-guided bone marrow aspirate and biopsy was carried out on 06/24/2012 and showed 48% plasma cells.  2. Bone marrow aspirate and biopsy was carried out on 12/04/2012 and showed infiltration with 24% plasma cells.   ASSESSMENT: Trevor Michael 73 y.o. male with a history of Multiple myeloma - Plan: CBC with Differential, Basic  metabolic panel (Bmet) - [facility], CBC with Differential, Comprehensive metabolic panel (Cmet) - [facility], SPEP & IFE with QIG, UIFE/TP, 24 hr urine, Lactate dehydrogenase (LDH) - [facility], CBC with Differential, Basic metabolic panel (Bmet) - [facility], CBC with Differential, Basic metabolic panel (Bmet) - [facility]  PLAN:  1. Stage III IgA lambda multiple myeloma S/p auto SCT, with rapid progression while on maitenance velcade. --Mr. Miera is continuing to recover clinically from his recent hospitalization.   As noted above, his bone marrow was packed with multiple myeloma explaining his decrease in counts.  He was seen at [facility] and Dr. Samule Ohm recommended aggressive salvage therapy with carfilzomib/revlimid/dexa. We started chemotherapy on 10/28/2013 as noted below:    --Carfilzomib 20 mg/m2 IV on days 1, 2, 8, 9, 15, 16 every 28 days;   --Lenalidomide 10 mg on day 1-21 of all cycles; (We plan to titrate up 25 mg based on his count recovery);   --Dexamethasone 40 mg for cycles 1-4 weekly .  His MM markers appear to improve with no detectable M-spike.  We will follow them with each cycle.    He has been off treatment due to recent hospitalization. Chemotherapy was restarted 12/09/2013 with day 1 of cycle #2. His counts were reviewed and the patient was reviewed with Dr. Juliann Mule. He will proceed with day 8 and 9 of cycle #2 of this week as scheduled. He will have a #15 and 16 on May 6th and 7th, 2015. We'll plan to have him followup for another symptom management visit prior to the start of cycle #3 on 01/06/2014. Will plan to order  restaging protein studies when he returns on 11/06/2013. This will include a SPEP with QIG  as well as a urine IFE.    2. Hypokalemia likely secondary to poor po intake. --Patient was provided immodium to help with the diarrhea.  In the setting on improving renal dysfunction.   Continue oral potassium 40 mEQ daily.   3. Rash, resolved.    4. AKI, slowly improving -- Creatinine was 1.1  on admission.  It spiked as high a 4.38 on 3/27.  It has since trended downwards and is now at the upper limit of normal at 1.3.   5. Weight lost. --Resolved. Patient's appetite has improved and he has gained weight.   6. Follow-up -- A CBC differential and C. met have been scheduled for days 8, 9, 1516 of this current as well as the next cycle of chemotherapy. We will plan to draw a CBC differential and C. met on day 1 of each cycle. As stated above we will repeat his protein studies when he returns to the start of cycle #3 with SPEP with QIG, a urine IFE and LDH. We will schedule a lab and then chemotherapy on 12/09/2013  and repeat visit on 04/29.     All questions were answered. The patient knows to call the clinic with any problems, questions or concerns. We can certainly see the patient much sooner if necessary.  I spent 25 minutes counseling the patient face to face. The total time spent in the appointment was 35 minutes.    Carlton Adam, PA-C 12/16/2013 3:25 PM

## 2013-12-17 ENCOUNTER — Ambulatory Visit (HOSPITAL_BASED_OUTPATIENT_CLINIC_OR_DEPARTMENT_OTHER): Payer: Medicare Other

## 2013-12-17 DIAGNOSIS — Z5112 Encounter for antineoplastic immunotherapy: Secondary | ICD-10-CM

## 2013-12-17 DIAGNOSIS — C9000 Multiple myeloma not having achieved remission: Secondary | ICD-10-CM

## 2013-12-17 MED ORDER — ONDANSETRON 8 MG/NS 50 ML IVPB
INTRAVENOUS | Status: AC
  Filled 2013-12-17: qty 8

## 2013-12-17 MED ORDER — DEXAMETHASONE SODIUM PHOSPHATE 10 MG/ML IJ SOLN
INTRAMUSCULAR | Status: AC
  Filled 2013-12-17: qty 1

## 2013-12-17 MED ORDER — CARFILZOMIB CHEMO INJECTION 60 MG
20.0000 mg/m2 | Freq: Once | INTRAVENOUS | Status: AC
  Administered 2013-12-17: 38 mg via INTRAVENOUS
  Filled 2013-12-17: qty 19

## 2013-12-17 MED ORDER — DEXAMETHASONE SODIUM PHOSPHATE 10 MG/ML IJ SOLN
10.0000 mg | Freq: Once | INTRAMUSCULAR | Status: AC
  Administered 2013-12-17: 10 mg via INTRAVENOUS

## 2013-12-17 MED ORDER — ONDANSETRON 8 MG/50ML IVPB (CHCC)
8.0000 mg | Freq: Once | INTRAVENOUS | Status: AC
  Administered 2013-12-17: 8 mg via INTRAVENOUS

## 2013-12-17 MED ORDER — SODIUM CHLORIDE 0.9 % IV SOLN
Freq: Once | INTRAVENOUS | Status: AC
  Administered 2013-12-17: 13:00:00 via INTRAVENOUS

## 2013-12-17 NOTE — Patient Instructions (Signed)
Continue labs and chemotherapy as scheduled Continue Revlimid as prescribed  Continue dexamethasone weekly as prescribed Followup in 3 weeks prior to the start of your next scheduled cycle of chemotherapy

## 2013-12-17 NOTE — Patient Instructions (Signed)
Carfilzomib injection What is this medicine? CARFILZOMIB (kar FILZ oh mib) is a chemotherapy drug that works by slowing or stopping cancer cell growth. This medicine is used to treat multiple myeloma. This medicine may be used for other purposes; ask your health care provider or pharmacist if you have questions. COMMON BRAND NAME(S): KYPROLIS What should I tell my health care provider before I take this medicine? They need to know if you have any of these conditions: -heart disease -irregular heartbeat -liver disease -lung or breathing disease -an unusual or allergic reaction to carfilzomib, or other medicines, foods, dyes, or preservatives -pregnant or trying to get pregnant -breast-feeding How should I use this medicine? This medicine is for injection or infusion into a vein. It is given by a health care professional in a hospital or clinic setting. Talk to your pediatrician regarding the use of this medicine in children. Special care may be needed. Overdosage: If you think you've taken too much of this medicine contact a poison control center or emergency room at once. Overdosage: If you think you have taken too much of this medicine contact a poison control center or emergency room at once. NOTE: This medicine is only for you. Do not share this medicine with others. What if I miss a dose? It is important not to miss your dose. Call your doctor or health care professional if you are unable to keep an appointment. What may interact with this medicine? Interactions are not expected. Give your health care provider a list of all the medicines, herbs, non-prescription drugs, or dietary supplements you use. Also tell them if you smoke, drink alcohol, or use illegal drugs. Some items may interact with your medicine. This list may not describe all possible interactions. Give your health care provider a list of all the medicines, herbs, non-prescription drugs, or dietary supplements you use. Also  tell them if you smoke, drink alcohol, or use illegal drugs. Some items may interact with your medicine. What should I watch for while using this medicine? Your condition will be monitored carefully while you are receiving this medicine. Report any side effects. Continue your course of treatment even though you feel ill unless your doctor tells you to stop. Call your doctor or health care professional for advice if you get a fever, chills or sore throat, or other symptoms of a cold or flu. Do not treat yourself. Try to avoid being around people who are sick. Do not become pregnant while taking this medicine. Women should inform their doctor if they wish to become pregnant or think they might be pregnant. There is a potential for serious side effects to an unborn child. Talk to your health care professional or pharmacist for more information. Do not breast-feed an infant while taking this medicine. Check with your doctor or health care professional if you get an attack of severe diarrhea, nausea and vomiting, or if you sweat a lot. The loss of too much body fluid can make it dangerous for you to take this medicine. You may get dizzy. Do not drive, use machinery, or do anything that needs mental alertness until you know how this medicine affects you. Do not stand or sit up quickly, especially if you are an older patient. This reduces the risk of dizzy or fainting spells. What side effects may I notice from receiving this medicine? Side effects that you should report to your doctor or health care professional as soon as possible: -allergic reactions like skin rash, itching or hives,  swelling of the face, lips, or tongue -breathing problems -chest pain or palpitationschest tightness -cough -dark urine -dizziness -feeling faint or lightheaded -fever or chills -general ill feeling or flu-like symptoms -light-colored stools -palpitations -right upper belly pain -swelling of the legs or ankles -unusual  bleeding or bruising -unusually weak or tired -yellowing of the eyes or skin  Side effects that usually do not require medical attention (Report these to your doctor or health care professional if they continue or are bothersome.): -diarrhea -headache -nausea, vomiting -tiredness This list may not describe all possible side effects. Call your doctor for medical advice about side effects. You may report side effects to FDA at 1-800-FDA-1088. Where should I keep my medicine? This drug is given in a hospital or clinic and will not be stored at home. NOTE: This sheet is a summary. It may not cover all possible information. If you have questions about this medicine, talk to your doctor, pharmacist, or health care provider.  2014, [employer] Standard. (2012-01-25 17:02:29)  

## 2013-12-23 ENCOUNTER — Ambulatory Visit (HOSPITAL_BASED_OUTPATIENT_CLINIC_OR_DEPARTMENT_OTHER): Payer: Medicare Other

## 2013-12-23 ENCOUNTER — Other Ambulatory Visit (HOSPITAL_BASED_OUTPATIENT_CLINIC_OR_DEPARTMENT_OTHER): Payer: BLUE CROSS/BLUE SHIELD

## 2013-12-23 VITALS — BP 157/83 | HR 67 | Temp 98.3°F | Resp 18

## 2013-12-23 DIAGNOSIS — C9000 Multiple myeloma not having achieved remission: Secondary | ICD-10-CM

## 2013-12-23 DIAGNOSIS — Z5112 Encounter for antineoplastic immunotherapy: Secondary | ICD-10-CM

## 2013-12-23 LAB — BASIC METABOLIC PANEL (CC13)
Anion Gap: 10 mEq/L (ref 3–11)
BUN: 18.7 mg/dL (ref 7.0–26.0)
CHLORIDE: 104 meq/L (ref 98–109)
CO2: 26 meq/L (ref 22–29)
Calcium: 8.3 mg/dL — ABNORMAL LOW (ref 8.4–10.4)
Creatinine: 1.3 mg/dL (ref 0.7–1.3)
Glucose: 115 mg/dl (ref 70–140)
Potassium: 3.4 mEq/L — ABNORMAL LOW (ref 3.5–5.1)
SODIUM: 140 meq/L (ref 136–145)

## 2013-12-23 LAB — CBC WITH DIFFERENTIAL/PLATELET
BASO%: 0.3 % (ref 0.0–2.0)
BASOS ABS: 0 10*3/uL (ref 0.0–0.1)
EOS ABS: 0.1 10*3/uL (ref 0.0–0.5)
EOS%: 2.8 % (ref 0.0–7.0)
HEMATOCRIT: 30.7 % — AB (ref 38.4–49.9)
HEMOGLOBIN: 10.4 g/dL — AB (ref 13.0–17.1)
LYMPH#: 0.6 10*3/uL — AB (ref 0.9–3.3)
LYMPH%: 19 % (ref 14.0–49.0)
MCH: 34.2 pg — AB (ref 27.2–33.4)
MCHC: 33.9 g/dL (ref 32.0–36.0)
MCV: 101 fL — ABNORMAL HIGH (ref 79.3–98.0)
MONO#: 0.7 10*3/uL (ref 0.1–0.9)
MONO%: 24.9 % — ABNORMAL HIGH (ref 0.0–14.0)
NEUT%: 53 % (ref 39.0–75.0)
NEUTROS ABS: 1.5 10*3/uL (ref 1.5–6.5)
Platelets: 129 10*3/uL — ABNORMAL LOW (ref 140–400)
RBC: 3.04 10*6/uL — ABNORMAL LOW (ref 4.20–5.82)
RDW: 15.6 % — AB (ref 11.0–14.6)
WBC: 2.9 10*3/uL — ABNORMAL LOW (ref 4.0–10.3)
nRBC: 0 % (ref 0–0)

## 2013-12-23 MED ORDER — ONDANSETRON 8 MG/NS 50 ML IVPB
INTRAVENOUS | Status: AC
Start: 2013-12-23 — End: 2013-12-23
  Filled 2013-12-23: qty 8

## 2013-12-23 MED ORDER — DEXAMETHASONE SODIUM PHOSPHATE 10 MG/ML IJ SOLN
10.0000 mg | Freq: Once | INTRAMUSCULAR | Status: AC
  Administered 2013-12-23: 10 mg via INTRAVENOUS

## 2013-12-23 MED ORDER — DEXTROSE 5 % IV SOLN
20.0000 mg/m2 | Freq: Once | INTRAVENOUS | Status: AC
  Administered 2013-12-23: 38 mg via INTRAVENOUS
  Filled 2013-12-23: qty 19

## 2013-12-23 MED ORDER — DEXAMETHASONE SODIUM PHOSPHATE 10 MG/ML IJ SOLN
INTRAMUSCULAR | Status: AC
  Filled 2013-12-23: qty 1

## 2013-12-23 MED ORDER — SODIUM CHLORIDE 0.9 % IV SOLN
Freq: Once | INTRAVENOUS | Status: AC
  Administered 2013-12-23: 11:00:00 via INTRAVENOUS

## 2013-12-23 MED ORDER — SODIUM CHLORIDE 0.9 % IV SOLN
Freq: Once | INTRAVENOUS | Status: AC
  Administered 2013-12-23: 10:00:00 via INTRAVENOUS

## 2013-12-23 MED ORDER — ONDANSETRON 8 MG/50ML IVPB (CHCC)
8.0000 mg | Freq: Once | INTRAVENOUS | Status: AC
  Administered 2013-12-23: 8 mg via INTRAVENOUS

## 2013-12-23 NOTE — Progress Notes (Signed)
Per Dr. Juliann Mule, it's OK to treat with today's labs.

## 2013-12-23 NOTE — Patient Instructions (Signed)
[facility] Discharge Instructions for Patients Receiving Chemotherapy  Today you received the following chemotherapy agents Kyprolis To help prevent nausea and vomiting after your treatment, we encourage you to take your nausea medication as prescribed.  If you develop nausea and vomiting that is not controlled by your nausea medication, call the clinic.   BELOW ARE SYMPTOMS THAT SHOULD BE REPORTED IMMEDIATELY:  *FEVER GREATER THAN 100.5 F  *CHILLS WITH OR WITHOUT FEVER  NAUSEA AND VOMITING THAT IS NOT CONTROLLED WITH YOUR NAUSEA MEDICATION  *UNUSUAL SHORTNESS OF BREATH  *UNUSUAL BRUISING OR BLEEDING  TENDERNESS IN MOUTH AND THROAT WITH OR WITHOUT PRESENCE OF ULCERS  *URINARY PROBLEMS  *BOWEL PROBLEMS  UNUSUAL RASH Items with * indicate a potential emergency and should be followed up as soon as possible.  Feel free to call the clinic you have any questions or concerns. The clinic phone number is [phone].    

## 2013-12-24 ENCOUNTER — Ambulatory Visit (HOSPITAL_BASED_OUTPATIENT_CLINIC_OR_DEPARTMENT_OTHER): Payer: Medicare Other

## 2013-12-24 ENCOUNTER — Other Ambulatory Visit: Payer: Self-pay | Admitting: Medical Oncology

## 2013-12-24 VITALS — BP 155/70 | HR 74 | Temp 98.3°F | Resp 18

## 2013-12-24 DIAGNOSIS — Z5112 Encounter for antineoplastic immunotherapy: Secondary | ICD-10-CM

## 2013-12-24 DIAGNOSIS — D61810 Antineoplastic chemotherapy induced pancytopenia: Secondary | ICD-10-CM

## 2013-12-24 DIAGNOSIS — C9000 Multiple myeloma not having achieved remission: Secondary | ICD-10-CM

## 2013-12-24 MED ORDER — DEXAMETHASONE SODIUM PHOSPHATE 10 MG/ML IJ SOLN
10.0000 mg | Freq: Once | INTRAMUSCULAR | Status: AC
Start: 2013-12-24 — End: 2013-12-24
  Administered 2013-12-24: 10 mg via INTRAVENOUS

## 2013-12-24 MED ORDER — SODIUM CHLORIDE 0.9 % IV SOLN
Freq: Once | INTRAVENOUS | Status: AC
  Administered 2013-12-24: 12:00:00 via INTRAVENOUS

## 2013-12-24 MED ORDER — ONDANSETRON 8 MG/NS 50 ML IVPB
INTRAVENOUS | Status: AC
  Filled 2013-12-24: qty 8

## 2013-12-24 MED ORDER — DEXAMETHASONE SODIUM PHOSPHATE 10 MG/ML IJ SOLN
INTRAMUSCULAR | Status: AC
  Filled 2013-12-24: qty 1

## 2013-12-24 MED ORDER — ONDANSETRON 8 MG/50ML IVPB (CHCC)
8.0000 mg | Freq: Once | INTRAVENOUS | Status: AC
  Administered 2013-12-24: 8 mg via INTRAVENOUS

## 2013-12-24 MED ORDER — DEXTROSE 5 % IV SOLN
20.0000 mg/m2 | Freq: Once | INTRAVENOUS | Status: AC
  Administered 2013-12-24: 38 mg via INTRAVENOUS
  Filled 2013-12-24: qty 19

## 2013-12-24 NOTE — Patient Instructions (Signed)
[facility] Discharge Instructions for Patients Receiving Chemotherapy  Today you received the following chemotherapy agent: Kyprolis  To help prevent nausea and vomiting after your treatment, we encourage you to take your nausea medication as prescribed.    If you develop nausea and vomiting that is not controlled by your nausea medication, call the clinic.   BELOW ARE SYMPTOMS THAT SHOULD BE REPORTED IMMEDIATELY:  *FEVER GREATER THAN 100.5 F  *CHILLS WITH OR WITHOUT FEVER  NAUSEA AND VOMITING THAT IS NOT CONTROLLED WITH YOUR NAUSEA MEDICATION  *UNUSUAL SHORTNESS OF BREATH  *UNUSUAL BRUISING OR BLEEDING  TENDERNESS IN MOUTH AND THROAT WITH OR WITHOUT PRESENCE OF ULCERS  *URINARY PROBLEMS  *BOWEL PROBLEMS  UNUSUAL RASH Items with * indicate a potential emergency and should be followed up as soon as possible.  Feel free to call the clinic you have any questions or concerns. The clinic phone number is [phone].    

## 2013-12-28 ENCOUNTER — Telehealth: Payer: Self-pay

## 2013-12-28 ENCOUNTER — Telehealth: Payer: Self-pay | Admitting: Internal Medicine

## 2013-12-28 NOTE — Telephone Encounter (Signed)
Pt aware to pick up his appt calendar for may and june

## 2013-12-28 NOTE — Telephone Encounter (Signed)
Message copied by Janace Hoard on Mon Dec 28, 2013  4:48 PM ------      Message from: Baruch Merl      Created: Mon Dec 28, 2013  4:06 PM                   ----- Message -----         From: Carlton Adam, PA-C         Sent: 12/25/2013   1:07 PM           To: Baruch Merl, RN, Janace Hoard, RN, #            Please call patient and have him increase his intake of dietary potassium rich foods ------

## 2013-12-28 NOTE — Telephone Encounter (Signed)
S/w pt that Adrena wants him to increase potassium rich foods. Pt started naming foods that are appropriate and said he would increase them.

## 2013-12-30 ENCOUNTER — Ambulatory Visit: Payer: Medicare Other

## 2013-12-30 ENCOUNTER — Other Ambulatory Visit: Payer: Self-pay | Admitting: Internal Medicine

## 2013-12-30 DIAGNOSIS — C9000 Multiple myeloma not having achieved remission: Secondary | ICD-10-CM

## 2013-12-31 ENCOUNTER — Other Ambulatory Visit (HOSPITAL_BASED_OUTPATIENT_CLINIC_OR_DEPARTMENT_OTHER): Payer: BLUE CROSS/BLUE SHIELD

## 2013-12-31 ENCOUNTER — Other Ambulatory Visit: Payer: Self-pay | Admitting: *Deleted

## 2013-12-31 ENCOUNTER — Ambulatory Visit: Payer: Medicare Other

## 2013-12-31 DIAGNOSIS — C9000 Multiple myeloma not having achieved remission: Secondary | ICD-10-CM

## 2013-12-31 LAB — CBC WITH DIFFERENTIAL/PLATELET
BASO%: 0.1 % (ref 0.0–2.0)
Basophils Absolute: 0 10*3/uL (ref 0.0–0.1)
EOS%: 0 % (ref 0.0–7.0)
Eosinophils Absolute: 0 10*3/uL (ref 0.0–0.5)
HEMATOCRIT: 31.4 % — AB (ref 38.4–49.9)
HGB: 10.6 g/dL — ABNORMAL LOW (ref 13.0–17.1)
LYMPH#: 0.4 10*3/uL — AB (ref 0.9–3.3)
LYMPH%: 8.9 % — ABNORMAL LOW (ref 14.0–49.0)
MCH: 35 pg — AB (ref 27.2–33.4)
MCHC: 33.7 g/dL (ref 32.0–36.0)
MCV: 103.7 fL — ABNORMAL HIGH (ref 79.3–98.0)
MONO#: 0.6 10*3/uL (ref 0.1–0.9)
MONO%: 12.2 % (ref 0.0–14.0)
NEUT#: 3.9 10*3/uL (ref 1.5–6.5)
NEUT%: 78.8 % — AB (ref 39.0–75.0)
Platelets: 176 10*3/uL (ref 140–400)
RBC: 3.03 10*6/uL — ABNORMAL LOW (ref 4.20–5.82)
RDW: 16.2 % — ABNORMAL HIGH (ref 11.0–14.6)
WBC: 4.9 10*3/uL (ref 4.0–10.3)

## 2013-12-31 LAB — BASIC METABOLIC PANEL (CC13)
Anion Gap: 14 mEq/L — ABNORMAL HIGH (ref 3–11)
BUN: 25 mg/dL (ref 7.0–26.0)
CO2: 22 meq/L (ref 22–29)
Calcium: 8.9 mg/dL (ref 8.4–10.4)
Chloride: 105 mEq/L (ref 98–109)
Creatinine: 1.2 mg/dL (ref 0.7–1.3)
GLUCOSE: 117 mg/dL (ref 70–140)
Potassium: 3.9 mEq/L (ref 3.5–5.1)
Sodium: 140 mEq/L (ref 136–145)

## 2013-12-31 NOTE — Telephone Encounter (Signed)
THIS REFILL REQUEST FOR REVLIMID WAS GIVEN TO DR.CHISM'S NURSE, KATHY BUYCK,RN. 

## 2014-01-01 MED ORDER — LENALIDOMIDE 10 MG PO CAPS: 10.0000 mg | ORAL_CAPSULE | Freq: Every day | ORAL | Status: DC

## 2014-01-01 NOTE — Addendum Note (Signed)
Addended by: Wyonia Hough on: 01/01/2014 11:43 AM   Modules accepted: Orders

## 2014-01-06 ENCOUNTER — Encounter: Payer: Self-pay | Admitting: Physician Assistant

## 2014-01-06 ENCOUNTER — Ambulatory Visit (HOSPITAL_BASED_OUTPATIENT_CLINIC_OR_DEPARTMENT_OTHER): Payer: Medicare Other

## 2014-01-06 ENCOUNTER — Telehealth: Payer: Self-pay | Admitting: Internal Medicine

## 2014-01-06 ENCOUNTER — Other Ambulatory Visit (HOSPITAL_BASED_OUTPATIENT_CLINIC_OR_DEPARTMENT_OTHER): Payer: BLUE CROSS/BLUE SHIELD

## 2014-01-06 ENCOUNTER — Encounter: Payer: Self-pay | Admitting: Internal Medicine

## 2014-01-06 ENCOUNTER — Ambulatory Visit (HOSPITAL_BASED_OUTPATIENT_CLINIC_OR_DEPARTMENT_OTHER): Payer: BLUE CROSS/BLUE SHIELD | Admitting: Physician Assistant

## 2014-01-06 VITALS — BP 163/80 | HR 62 | Temp 97.3°F | Resp 20 | Ht 69.0 in | Wt 154.7 lb

## 2014-01-06 DIAGNOSIS — C9000 Multiple myeloma not having achieved remission: Secondary | ICD-10-CM

## 2014-01-06 DIAGNOSIS — Z5112 Encounter for antineoplastic immunotherapy: Secondary | ICD-10-CM

## 2014-01-06 DIAGNOSIS — E876 Hypokalemia: Secondary | ICD-10-CM

## 2014-01-06 LAB — COMPREHENSIVE METABOLIC PANEL (CC13)
ALT: 12 U/L (ref 0–55)
ANION GAP: 10 meq/L (ref 3–11)
AST: 15 U/L (ref 5–34)
Albumin: 3.8 g/dL (ref 3.5–5.0)
Alkaline Phosphatase: 139 U/L (ref 40–150)
BILIRUBIN TOTAL: 0.39 mg/dL (ref 0.20–1.20)
BUN: 17 mg/dL (ref 7.0–26.0)
CO2: 28 mEq/L (ref 22–29)
Calcium: 8.9 mg/dL (ref 8.4–10.4)
Chloride: 104 mEq/L (ref 98–109)
Creatinine: 1.1 mg/dL (ref 0.7–1.3)
GLUCOSE: 87 mg/dL (ref 70–140)
Potassium: 3.9 mEq/L (ref 3.5–5.1)
Sodium: 142 mEq/L (ref 136–145)
TOTAL PROTEIN: 6.3 g/dL — AB (ref 6.4–8.3)

## 2014-01-06 LAB — CBC WITH DIFFERENTIAL/PLATELET
BASO%: 1 % (ref 0.0–2.0)
Basophils Absolute: 0 10*3/uL (ref 0.0–0.1)
EOS ABS: 0 10*3/uL (ref 0.0–0.5)
EOS%: 1.5 % (ref 0.0–7.0)
HCT: 34.2 % — ABNORMAL LOW (ref 38.4–49.9)
HGB: 11.5 g/dL — ABNORMAL LOW (ref 13.0–17.1)
LYMPH%: 21.2 % (ref 14.0–49.0)
MCH: 34.7 pg — ABNORMAL HIGH (ref 27.2–33.4)
MCHC: 33.7 g/dL (ref 32.0–36.0)
MCV: 103.1 fL — AB (ref 79.3–98.0)
MONO#: 0.8 10*3/uL (ref 0.1–0.9)
MONO%: 30.3 % — ABNORMAL HIGH (ref 0.0–14.0)
NEUT%: 46 % (ref 39.0–75.0)
NEUTROS ABS: 1.3 10*3/uL — AB (ref 1.5–6.5)
Platelets: 225 10*3/uL (ref 140–400)
RBC: 3.32 10*6/uL — AB (ref 4.20–5.82)
RDW: 15.2 % — ABNORMAL HIGH (ref 11.0–14.6)
WBC: 2.8 10*3/uL — AB (ref 4.0–10.3)
lymph#: 0.6 10*3/uL — ABNORMAL LOW (ref 0.9–3.3)

## 2014-01-06 LAB — LACTATE DEHYDROGENASE (CC13): LDH: 216 U/L (ref 125–245)

## 2014-01-06 MED ORDER — ONDANSETRON 8 MG/50ML IVPB (CHCC)
8.0000 mg | Freq: Once | INTRAVENOUS | Status: AC
  Administered 2014-01-06: 8 mg via INTRAVENOUS

## 2014-01-06 MED ORDER — SODIUM CHLORIDE 0.9 % IV SOLN
Freq: Once | INTRAVENOUS | Status: AC
  Administered 2014-01-06: 13:00:00 via INTRAVENOUS

## 2014-01-06 MED ORDER — ONDANSETRON 8 MG/NS 50 ML IVPB
INTRAVENOUS | Status: AC
  Filled 2014-01-06: qty 8

## 2014-01-06 MED ORDER — DEXTROSE 5 % IV SOLN
20.0000 mg/m2 | Freq: Once | INTRAVENOUS | Status: AC
  Administered 2014-01-06: 38 mg via INTRAVENOUS
  Filled 2014-01-06: qty 19

## 2014-01-06 MED ORDER — DEXAMETHASONE SODIUM PHOSPHATE 10 MG/ML IJ SOLN
INTRAMUSCULAR | Status: AC
  Filled 2014-01-06: qty 1

## 2014-01-06 MED ORDER — DEXAMETHASONE SODIUM PHOSPHATE 10 MG/ML IJ SOLN
10.0000 mg | Freq: Once | INTRAMUSCULAR | Status: AC
  Administered 2014-01-06: 10 mg via INTRAVENOUS

## 2014-01-06 NOTE — Progress Notes (Signed)
Per Adrena PA, okay to tx with ANC 1.3

## 2014-01-06 NOTE — Telephone Encounter (Signed)
gv pt appt schedule for may thru july. pt tx for 6/17 and 6/18 scheduled for 6/16 and 6/17 due to pt has an all day appt @ [facility] 6/18,.

## 2014-01-06 NOTE — Progress Notes (Signed)
[facility] OFFICE PROGRESS NOTE  Trevor Hare, MD [address] [address]  DIAGNOSIS: Multiple myeloma - Plan: CBC with Differential, Comprehensive metabolic panel (Cmet) - [facility], CBC with Differential, Basic metabolic panel (Bmet) - [facility], CBC with Differential, Basic metabolic panel (Bmet) - [facility]  No chief complaint on file.   CURRENT THERAPY:   Aggressive salvage therapy with carfilzomib/revlimid/dexa started on 10/28/2013. Status post 2 cycles.     Multiple myeloma   05/21/2012 Initial Diagnosis Multiple myeloma, ISS stage III.   06/19/2012 Imaging Lytic lesions in the calvaria and right hemipelvis and a possible lesion in the right humerus.    06/20/2012 Imaging CT scan of the right humerus showed a 2.3 cm lucent lesion with probable mild endosteal thinning anteriorly near the bicipital groove.  several small lytic lesions in humeral head, scapula, glenoid, and proximal radius.     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 Plasma cells are lambda ligh chain restricted.    06/24/2012 Pathology Cytogenetics revealed the presence of normal male chormosomes with no observable clonal abnormalities.  Single cell with additional chromosome material on 3q, loss of 13 .  DNA, gain of chromosome 11.    07/01/2012 - 01/02/2013 Chemotherapy Velcade, cytoxan and decadron. doses were increased on 11/06/2012.  Neulasta was started on 08/16/2012 because of leukopenia.    07/10/2012 - 07/28/2012 Radiation Therapy He received XRT to the right humerus 25 Gy in 10 fractions.    12/04/2012 Bone Marrow Biopsy Hypercllular for age with trilineage hematopoiesis in addition to increased number of atypical plasma cells estimated at 24% of all cells in the aspirate. Lamda light chain restriction. rare circulating plasma cells.   12/04/2012 Pathology Cytogenetics revealed the presence of 2  clonal cell lines. first line with chromosal normal (25%).  2nd line was abnormal, missing a Y chromosome. 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 FISH showed gain of 11.    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  S/p melphalan 140 mg /m2 on 01/27/2013.    06/27/2013 Tumor Marker 24-hour urine yielded 17.1 gram of protein of which 16.9 grams was free lambda light chains.  Beta 2 microglobulin was 5.8. Albumin 3.2. IgG 6,320.    07/07/2013 - 09/01/2013 Chemotherapy Maintenance velcade started.   09/21/2013 Progression Counts decreasing; WBC followed by Plts. Referred to [facility] for recommendations for next therapy.    10/12/2013 Bone Marrow Biopsy Done at [facility].  Per care everywhere, PLASMA CELLS COMPRISE 80% OF A PACKED BONE MARROW.    10/22/2013 Tumor Marker Labs done at [facility].  spep m-spike 0.08, FLC lambda 379.00, ratio 0. 24 hour urine, m-spike 16632, IFE positive for monoclonal lambda.   10/27/2013 -  Chemotherapy Started salvage chemotherapy with carlfozomib plus dexamethasone plus revlimid.    11/09/2013 - 11/17/2013 Hospital Admission Admitted with fever.  Started on broad spectrum antibiotics. Had severe anemia and was give hemoglobin with questionable tranfusion reaction. 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 Discharged off antibiotics.      INTERVAL HISTORY: Trevor Michael 73 y.o. 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  He was last seen by Dr. Juliann Mule on 12/16/13.  As noted above he started salvage chemotherapy.  Overall is tolerating this chemotherapy relatively well. He reports themselves ophthalmologist about 3 weeks ago started Lumigan eyedrops. His  dry eyes. Is not experience any change in his vision. He had some increased lower extremity edema up from his baseline and recently saw his cardiologist Dr. Georgina Peer for evaluation. Patient states that this was an  echocardiogram was okay. Dr. Georgina Peer increase his Lasix to 40 mg by mouth 3 times daily. He is to followup with Dr. Georgina Peer in June of 2015. He denied any bone pain, significant weight loss or night sweats.   MEDICAL HISTORY: Past Medical History  Diagnosis Date  . Other and unspecified hyperlipidemia   . Essential hypertension, benign   . BPH (benign prostatic hyperplasia)   . Early stage glaucoma   . Heart murmur     "since my teens" (06/18/2012)  . Cervical stenosis of spine   . GERD (gastroesophageal reflux disease)   . Bone cancer     multiple bone lesion/right humerus  . Spinal stenosis   . Chronic kidney disease     renal insufficiency  . Arthritis   . DDD (degenerative disc disease)     c-5=-c6, and spondylosis  . Cancer 05/2012    IgA lambda multiple myeloma  . Anorexia 06/25/12    1 month hx   . Diverticulitis   . History of chemotherapy 07/01/12    Velcade,Cytoxin and decadron    INTERIM HISTORY: has HYPERLIPIDEMIA, MILD; HYPERTENSION, MILD; BENIGN PROSTATIC HYPERTROPHY; Routine health maintenance; Rhinitis, non-allergic; Multiple myeloma; Bone cancer; Fever; Syncope; Dehydration; Acute renal failure; Acute hemolysis-non immune; Nausea & vomiting; Pancytopenia due to chemotherapy; Pulmonary edema; and Elevated troponin on his problem list.    ALLERGIES:  has No Known Allergies.  MEDICATIONS: has a current medication list which includes the following prescription(s): acyclovir, allopurinol, aspirin ec, atenolol-chlorthalidone, bimatoprost, brimonidine-timolol, dexamethasone, feeding supplement (resource breeze), folic acid, lenalidomide, potassium chloride sa, simvastatin, sodium bicarbonate, tamsulosin, acetaminophen, esomeprazole, loperamide, loratadine, prochlorperazine, and senna.  SURGICAL HISTORY:  Past Surgical History  Procedure Laterality Date  . Lipoma excision  07/1986    left shoulder/scapula  . Esophagogastroduodenoscopy  06/20/2012    Procedure:  ESOPHAGOGASTRODUODENOSCOPY (EGD);  Surgeon: Beryle Beams, MD;  Location: [facility] ENDOSCOPY;  Service: Endoscopy;  Laterality: N/A;  . Prostate biopsy  2008    benign   PROBLEM LIST:  1. Renal insufficiency. Creatinine clearance on 06/27/2012 was 68 mL/min.  2. A 2.3 cm lytic lesion in the right humerus at risk for pathologic fracture. Radiation treatments were administered to the right humerus 25 Gy in 10 fractions from 07/10/2012 through 07/28/2012.  3. Hypertension.  4. Benign prostate hypertrophy and elevated prostate specific antigen, under the care of Dr. Rana Snare. The patient had a prostate biopsy in 2008 that was benign.  5. Dyslipidemia.  6. Arthritis involving the cervical spine  7. Glaucoma.   REVIEW OF SYSTEMS:   Constitutional: Denies fevers, chills but reports some weight gain since recent hospitalization  Eyes: Denies blurriness of vision Ears, nose, mouth, throat, and face: Denies mucositis or sore throat Respiratory: Denies cough, dyspnea or wheezesCardiovascular: Denies palpitation, chest discomfort, does report lower extremity edema Gastrointestinal:  Denies nausea, heartburn or change in bowel habits Skin: Denies abnormal skin rashes Lymphatics: Denies new lymphadenopathy or easy bruising Neurological:Denies numbness, tingling or new weaknesses Behavioral/Psych: Mood is stable, no new changes  All other systems were reviewed with the patient and are negative.  PHYSICAL EXAMINATION: ECOG PERFORMANCE STATUS: 1 - Symptomatic but completely ambulatory  Blood pressure 163/80, pulse 62, temperature 97.3 F (36.3 C), temperature source Oral, resp. rate 20, height 5' 9"  (1.753  m), weight 154 lb 11.2 oz (70.171 kg).  GENERAL:alert, no distress and comfortable; thin, chronically ill appearing. SKIN: skin color, texture, turgor are normal, no rashes or significant lesions EYES: normal, Conjunctiva are pink and non-injected, sclera clear, there is infraorbital edema present  bilaterally OROPHARYNX:no exudate, no erythema and lips, buccal mucosa, and tongue normal  NECK: supple, thyroid normal size, non-tender, without nodularity LYMPH:  no palpable lymphadenopathy in the cervical, axillary or supraclavicular LUNGS: clear to auscultation and percussion with normal breathing effort HEART: regular rate & rhythm and no murmurs and no lower extremity edema ABDOMEN:abdomen soft, non-tender and normal bowel sounds Musculoskeletal:no cyanosis of digits and no clubbing. 2+ pitting edema bilateral lower extremities NEURO: alert & oriented x 3 with fluent speech, no focal motor/sensory deficits  Labs:  Lab Results  Component Value Date   WBC 2.8* 01/06/2014   HGB 11.5* 01/06/2014   HCT 34.2* 01/06/2014   MCV 103.1* 01/06/2014   PLT 225 01/06/2014   NEUTROABS 1.3* 01/06/2014      Chemistry      Component Value Date/Time   NA 142 01/06/2014 1128   NA 138 11/17/2013 0650   K 3.9 01/06/2014 1128   K 4.1 11/17/2013 0650   CL 101 11/17/2013 0650   CL 102 12/05/2012 0845   CO2 28 01/06/2014 1128   CO2 23 11/17/2013 0650   BUN 17.0 01/06/2014 1128   BUN 44* 11/17/2013 0650   CREATININE 1.1 01/06/2014 1128   CREATININE 3.22* 11/17/2013 0650   CREATININE 1.88* 06/27/2012 1039      Component Value Date/Time   CALCIUM 8.9 01/06/2014 1128   CALCIUM 6.8* 11/17/2013 0650   ALKPHOS 139 01/06/2014 1128   ALKPHOS 78 11/16/2013 0650   AST 15 01/06/2014 1128   AST 23 11/16/2013 0650   ALT 12 01/06/2014 1128   ALT 11 11/16/2013 0650   BILITOT 0.39 01/06/2014 1128   BILITOT 0.8 11/16/2013 0650     Results for Trevor Michael, Trevor Michael (MRN [id number]) as of 12/02/2013 15:22  Ref. Range 11/20/2013 11:07  Albumin ELP Latest Range: 55.8-66.1 % 57.9  COMMENT (PROTEIN ELECTROPHOR) No range found *  Alpha-1-Globulin Latest Range: 2.9-4.9 % 7.7 (H)  Alpha-2-Globulin Latest Range: 7.1-11.8 % 11.1  Beta Globulin Latest Range: 4.7-7.2 % 4.5 (L)  Beta 2 Latest Range: 3.2-6.5 % 5.0  Gamma Globulin Latest Range:  11.1-18.8 % 13.8  M-SPIKE, % No range found NOT DET  SPE Interp. No range found *  IgG (Immunoglobin G), Serum Latest Range: 832-867-9582 mg/dL 911  IgA Latest Range: 68-379 mg/dL 176  IgM, Serum Latest Range: 41-251 mg/dL 18 (L)  Total Protein, Serum Electrophoresis Latest Range: 6.0-8.3 g/dL 6.0  Kappa free light chain Latest Range: 0.33-1.94 mg/dL 0.70  Lambda Free Lght Chn Latest Range: 0.57-2.63 mg/dL 16.60 (H)  Kappa:Lambda Ratio Latest Range: 0.26-1.65  0.04 (L)   RADIOGRAPHIC STUDIES: 1. MRI of the cervical spine without IV contrast on 11/07/2009 showed disk degeneration and spondylosis, most severe at C5-C6 where there is mild to moderate spinal stenosis. There is foraminal encroachment at this level, left greater than right. There were degenerative changes at other levels which were described in detail in the body of the report.  2. Chest x-ray, 2 view, from 11/16/2009 showed no acute findings.  3. CT abdomen and pelvis without IV contrast on 06/18/2012 showed multiple lytic bone lesions highly suspicious for metastatic disease or multiple myeloma. The largest lesion seen with some cortical destruction in the anterior aspect of  the iliac bone measured 3 cm. There was no evidence of colitis or diverticulitis. There was no evidence for hydronephrosis or hydroureter. There were bilateral probable renal cysts. There was an enlarged prostate gland with indentation of the urinary bladder base. There was a small right hydrocele.  4. Metastatic bone survey from 06/19/2012 showed lytic lesions in the calvaria and right hemipelvis and a possible lesion in the right humerus. This latter lesion was not specifically mentioned in the report.  5. Nuclear bone scan on 06/19/2012 showed some increased uptake involving the femur and humerus bilaterally, possibly reflecting changes of metabolic bone disease. There was a lack of correlation with the bone scan and the findings seen on CT and x-rays, raising the  possibility of multiple myeloma.  6. Renal ultrasound on 06/19/2012 showed increased echogenicity of the renal parenchyma bilaterally consistent with renal medical disease. There was no evidence for obstruction. There was a slightly enlarged prostate gland.  7. CT scan of the right humerus without IV contrast on 06/20/2012 showed 2.3-cm lucent lesion with probable mild endosteal thinning anteriorly near the bicipital groove. There were several small lytic lesions present within the humeral head, scapula, glenoid, and proximal radius. No pathologic fracture or soft tissue mass was seen. There were a few small nodules within the visualized right lung, the largest of which was 4 mm in the upper lobe on image 78. 8. Metastatic bone survey (07/22/2011) demonstrates multiple new lytic lesions are noted with innumerable lesions noted throughout the skull, left scapula, left humerus, and both femurs. These findings are indicative of progressive myeloma. 9. Metastatic bone survey (07/21/2013) demonstrates multiple new lytic lesions are noted with innumerable lesions noted throughout the skull, left scapula, left humerus, and both femurs.  These findings are indicative of progressive myeloma.   PROCEDURES:  1. CT-guided bone marrow aspirate and biopsy was carried out on 06/24/2012 and showed 48% plasma cells.  2. Bone marrow aspirate and biopsy was carried out on 12/04/2012 and showed infiltration with 24% plasma cells.   ASSESSMENT: Trevor Michael 73 y.o. male with a history of Multiple myeloma - Plan: CBC with Differential, Comprehensive metabolic panel (Cmet) - [facility], CBC with Differential, Basic metabolic panel (Bmet) - [facility], CBC with Differential, Basic metabolic panel (Bmet) - [facility]  PLAN:  1. Stage III IgA lambda multiple myeloma S/p auto SCT, with rapid progression while on maitenance velcade. --Mr. Daughtridge is continuing to recover clinically from his recent hospitalization.   As noted above, his bone  marrow was packed with multiple myeloma explaining his decrease in counts.  He was seen at [facility] and Dr. Samule Ohm recommended aggressive salvage therapy with carfilzomib/revlimid/dexa. We started chemotherapy on 10/28/2013 as noted below:    --Carfilzomib 20 mg/m2 IV on days 1, 2, 8, 9, 15, 16 every 28 days;   --Lenalidomide 10 mg on day 1-21 of all cycles; (We plan to titrate up 25 mg based on his count recovery);   --Dexamethasone 40 mg for cycles 1-4 weekly .   His MM markers appear to improve with no detectable M-spike.  We will follow them with each cycle.     His counts were reviewed and the patient was reviewed with Dr. Juliann Mule. He will proceed with day 1 and 2 of cycle #3 of this week as scheduled.  We'll plan to have him followup for another symptom management visit prior to the start of cycle #4 on 02/02/2014. Repeat protein studies were drawn and are pending the patient states that he'll  return his urine protein study 02/08/2049.   2. Hypokalemia likely secondary to poor po intake. --Patient was provided immodium to help with the diarrhea.  In the setting on improving renal dysfunction.   Continue oral potassium 40 mEQ daily.   3. Rash, resolved.    4. AKI, slowly improving -- Creatinine was 1.1 on admission.  It spiked as high a 4.38 on 3/27.  It has since trended downwards and is now at the upper limit of normal at 1.3.   5. Weight lost. --Resolved. Patient's appetite has improved and he has gained weight.   6. Follow-up -- A CBC differential and C. met have been scheduled for days 8, 9, 1516 of this current as well as the next cycle of chemotherapy. We will plan to draw a CBC differential and C. met on day 1 of each cycle.   All questions were answered. The patient knows to call the clinic with any problems, questions or concerns. We can certainly see the patient much sooner if necessary.  I spent 25 minutes counseling the patient face to face. The total time spent in the  appointment was 35 minutes.    Carlton Adam, PA-C 01/06/2014 3:43 PM

## 2014-01-07 ENCOUNTER — Ambulatory Visit (HOSPITAL_BASED_OUTPATIENT_CLINIC_OR_DEPARTMENT_OTHER): Payer: Medicare Other

## 2014-01-07 DIAGNOSIS — C9000 Multiple myeloma not having achieved remission: Secondary | ICD-10-CM

## 2014-01-07 DIAGNOSIS — Z5112 Encounter for antineoplastic immunotherapy: Secondary | ICD-10-CM

## 2014-01-07 MED ORDER — DEXAMETHASONE SODIUM PHOSPHATE 10 MG/ML IJ SOLN
10.0000 mg | Freq: Once | INTRAMUSCULAR | Status: AC
  Administered 2014-01-07: 10 mg via INTRAVENOUS

## 2014-01-07 MED ORDER — DEXTROSE 5 % IV SOLN
20.0000 mg/m2 | Freq: Once | INTRAVENOUS | Status: AC
  Administered 2014-01-07: 38 mg via INTRAVENOUS
  Filled 2014-01-07: qty 19

## 2014-01-07 MED ORDER — ONDANSETRON 8 MG/50ML IVPB (CHCC)
8.0000 mg | Freq: Once | INTRAVENOUS | Status: AC
  Administered 2014-01-07: 8 mg via INTRAVENOUS

## 2014-01-07 MED ORDER — ONDANSETRON 8 MG/NS 50 ML IVPB
INTRAVENOUS | Status: AC
Start: 2014-01-07 — End: 2014-01-07
  Filled 2014-01-07: qty 8

## 2014-01-07 MED ORDER — SODIUM CHLORIDE 0.9 % IV SOLN
Freq: Once | INTRAVENOUS | Status: AC
  Administered 2014-01-07: 11:00:00 via INTRAVENOUS

## 2014-01-07 MED ORDER — SODIUM CHLORIDE 0.9 % IV SOLN: Freq: Once | INTRAVENOUS | Status: DC

## 2014-01-07 MED ORDER — DEXAMETHASONE SODIUM PHOSPHATE 10 MG/ML IJ SOLN
INTRAMUSCULAR | Status: AC
  Filled 2014-01-07: qty 1

## 2014-01-09 NOTE — Patient Instructions (Signed)
Continue labs and chemotherapy as scheduled Followup with Dr. Juliann Mule 02/02/2014 prior to start scheduled cycle of chemotherapy

## 2014-01-12 LAB — SPEP & IFE WITH QIG
ALPHA-2-GLOBULIN: 11.3 % (ref 7.1–11.8)
Albumin ELP: 64 % (ref 55.8–66.1)
Alpha-1-Globulin: 4.8 % (ref 2.9–4.9)
Beta 2: 4.5 % (ref 3.2–6.5)
Beta Globulin: 6.3 % (ref 4.7–7.2)
GAMMA GLOBULIN: 9.1 % — AB (ref 11.1–18.8)
IGA: 69 mg/dL (ref 68–379)
IGG (IMMUNOGLOBIN G), SERUM: 565 mg/dL — AB (ref 650–1600)
IgM, Serum: 13 mg/dL — ABNORMAL LOW (ref 41–251)
TOTAL PROTEIN, SERUM ELECTROPHOR: 6.1 g/dL (ref 6.0–8.3)

## 2014-01-13 ENCOUNTER — Other Ambulatory Visit (HOSPITAL_BASED_OUTPATIENT_CLINIC_OR_DEPARTMENT_OTHER): Payer: BLUE CROSS/BLUE SHIELD

## 2014-01-13 ENCOUNTER — Ambulatory Visit: Payer: BLUE CROSS/BLUE SHIELD

## 2014-01-13 ENCOUNTER — Other Ambulatory Visit: Payer: Self-pay | Admitting: Internal Medicine

## 2014-01-13 DIAGNOSIS — C9000 Multiple myeloma not having achieved remission: Secondary | ICD-10-CM

## 2014-01-13 LAB — UIFE/LIGHT CHAINS/TP QN, 24-HR UR
ALBUMIN, U: DETECTED
Alpha 1, Urine: DETECTED — AB
Alpha 2, Urine: DETECTED — AB
BETA UR: DETECTED — AB
FREE KAPPA LT CHAINS, UR: 0.72 mg/dL (ref 0.14–2.42)
FREE LAMBDA LT CHAINS, UR: 3.41 mg/dL — AB (ref 0.02–0.67)
Free Kappa/Lambda Ratio: 0.21 ratio — ABNORMAL LOW (ref 2.04–10.37)
Free Lambda Excretion/Day: 47.74 mg/d
Free Lt Chn Excr Rate: 10.08 mg/d
GAMMA UR: DETECTED — AB
TIME-UPE24: 24 h
Total Protein, Urine-Ur/day: 67 mg/d (ref 10–140)
Total Protein, Urine: 4.8 mg/dL
Volume, Urine: 1400 mL

## 2014-01-13 LAB — CBC WITH DIFFERENTIAL/PLATELET
BASO%: 0.5 % (ref 0.0–2.0)
BASOS ABS: 0 10*3/uL (ref 0.0–0.1)
EOS ABS: 0.1 10*3/uL (ref 0.0–0.5)
EOS%: 5.1 % (ref 0.0–7.0)
HEMATOCRIT: 38.1 % — AB (ref 38.4–49.9)
HEMOGLOBIN: 12.7 g/dL — AB (ref 13.0–17.1)
LYMPH%: 17.2 % (ref 14.0–49.0)
MCH: 33.8 pg — ABNORMAL HIGH (ref 27.2–33.4)
MCHC: 33.3 g/dL (ref 32.0–36.0)
MCV: 101.5 fL — AB (ref 79.3–98.0)
MONO#: 0.7 10*3/uL (ref 0.1–0.9)
MONO%: 27.6 % — AB (ref 0.0–14.0)
NEUT%: 49.6 % (ref 39.0–75.0)
NEUTROS ABS: 1.2 10*3/uL — AB (ref 1.5–6.5)
Platelets: 134 10*3/uL — ABNORMAL LOW (ref 140–400)
RBC: 3.76 10*6/uL — ABNORMAL LOW (ref 4.20–5.82)
RDW: 15.3 % — ABNORMAL HIGH (ref 11.0–14.6)
WBC: 2.5 10*3/uL — ABNORMAL LOW (ref 4.0–10.3)
lymph#: 0.4 10*3/uL — ABNORMAL LOW (ref 0.9–3.3)

## 2014-01-13 LAB — BASIC METABOLIC PANEL (CC13)
ANION GAP: 11 meq/L (ref 3–11)
BUN: 17.2 mg/dL (ref 7.0–26.0)
CALCIUM: 8.7 mg/dL (ref 8.4–10.4)
CO2: 26 mEq/L (ref 22–29)
CREATININE: 1 mg/dL (ref 0.7–1.3)
Chloride: 104 mEq/L (ref 98–109)
GLUCOSE: 80 mg/dL (ref 70–140)
Potassium: 3.6 mEq/L (ref 3.5–5.1)
Sodium: 141 mEq/L (ref 136–145)

## 2014-01-13 MED ORDER — DEXAMETHASONE SODIUM PHOSPHATE 10 MG/ML IJ SOLN
INTRAMUSCULAR | Status: AC
  Filled 2014-01-13: qty 1

## 2014-01-13 MED ORDER — ONDANSETRON 8 MG/NS 50 ML IVPB
INTRAVENOUS | Status: AC
  Filled 2014-01-13: qty 8

## 2014-01-13 NOTE — Progress Notes (Signed)
MD saw patient in infusion area today. Will hold kyprolis today and tomorrow due to counts. Patient to return on 01/20/14 for labs and treatment. Patient given copy of labs and agreeable to return on 01/20/14. Cindi Carbon, RN

## 2014-01-14 ENCOUNTER — Ambulatory Visit: Payer: Medicare Other

## 2014-01-20 ENCOUNTER — Ambulatory Visit (HOSPITAL_BASED_OUTPATIENT_CLINIC_OR_DEPARTMENT_OTHER): Payer: Medicare Other

## 2014-01-20 ENCOUNTER — Other Ambulatory Visit (HOSPITAL_BASED_OUTPATIENT_CLINIC_OR_DEPARTMENT_OTHER): Payer: Medicare Other

## 2014-01-20 ENCOUNTER — Other Ambulatory Visit: Payer: Self-pay | Admitting: Internal Medicine

## 2014-01-20 VITALS — BP 150/74 | HR 61 | Temp 98.3°F

## 2014-01-20 DIAGNOSIS — C9000 Multiple myeloma not having achieved remission: Secondary | ICD-10-CM

## 2014-01-20 DIAGNOSIS — Z5112 Encounter for antineoplastic immunotherapy: Secondary | ICD-10-CM

## 2014-01-20 LAB — CBC WITH DIFFERENTIAL/PLATELET
BASO%: 1.6 % (ref 0.0–2.0)
Basophils Absolute: 0.1 10*3/uL (ref 0.0–0.1)
EOS ABS: 0.2 10*3/uL (ref 0.0–0.5)
EOS%: 6.3 % (ref 0.0–7.0)
HEMATOCRIT: 37.7 % — AB (ref 38.4–49.9)
HGB: 12.9 g/dL — ABNORMAL LOW (ref 13.0–17.1)
LYMPH#: 0.7 10*3/uL — AB (ref 0.9–3.3)
LYMPH%: 20 % (ref 14.0–49.0)
MCH: 34.1 pg — AB (ref 27.2–33.4)
MCHC: 34.2 g/dL (ref 32.0–36.0)
MCV: 99.9 fL — ABNORMAL HIGH (ref 79.3–98.0)
MONO#: 0.9 10*3/uL (ref 0.1–0.9)
MONO%: 26.3 % — ABNORMAL HIGH (ref 0.0–14.0)
NEUT%: 45.8 % (ref 39.0–75.0)
NEUTROS ABS: 1.6 10*3/uL (ref 1.5–6.5)
Platelets: 183 10*3/uL (ref 140–400)
RBC: 3.77 10*6/uL — ABNORMAL LOW (ref 4.20–5.82)
RDW: 14.7 % — AB (ref 11.0–14.6)
WBC: 3.6 10*3/uL — ABNORMAL LOW (ref 4.0–10.3)

## 2014-01-20 LAB — BASIC METABOLIC PANEL (CC13)
Anion Gap: 16 mEq/L — ABNORMAL HIGH (ref 3–11)
BUN: 20.9 mg/dL (ref 7.0–26.0)
CHLORIDE: 105 meq/L (ref 98–109)
CO2: 21 meq/L — AB (ref 22–29)
CREATININE: 1.1 mg/dL (ref 0.7–1.3)
Calcium: 8.6 mg/dL (ref 8.4–10.4)
GLUCOSE: 82 mg/dL (ref 70–140)
POTASSIUM: 3.6 meq/L (ref 3.5–5.1)
Sodium: 142 mEq/L (ref 136–145)

## 2014-01-20 MED ORDER — DEXTROSE 5 % IV SOLN
20.0000 mg/m2 | Freq: Once | INTRAVENOUS | Status: AC
  Administered 2014-01-20: 38 mg via INTRAVENOUS
  Filled 2014-01-20: qty 19

## 2014-01-20 MED ORDER — DEXAMETHASONE SODIUM PHOSPHATE 10 MG/ML IJ SOLN
INTRAMUSCULAR | Status: AC
  Filled 2014-01-20: qty 1

## 2014-01-20 MED ORDER — ONDANSETRON 8 MG/50ML IVPB (CHCC)
8.0000 mg | Freq: Once | INTRAVENOUS | Status: AC
  Administered 2014-01-20: 8 mg via INTRAVENOUS

## 2014-01-20 MED ORDER — ONDANSETRON 8 MG/NS 50 ML IVPB
INTRAVENOUS | Status: AC
  Filled 2014-01-20: qty 8

## 2014-01-20 MED ORDER — SODIUM CHLORIDE 0.9 % IV SOLN
Freq: Once | INTRAVENOUS | Status: AC
  Administered 2014-01-20: 12:00:00 via INTRAVENOUS

## 2014-01-20 MED ORDER — DEXAMETHASONE SODIUM PHOSPHATE 10 MG/ML IJ SOLN
10.0000 mg | Freq: Once | INTRAMUSCULAR | Status: AC
  Administered 2014-01-20: 10 mg via INTRAVENOUS

## 2014-01-20 NOTE — Patient Instructions (Signed)
[facility] Discharge Instructions for Patients Receiving Chemotherapy  Today you received the following chemotherapy agents kyprolis  To help prevent nausea and vomiting after your treatment, we encourage you to take your nausea medication as directed   If you develop nausea and vomiting that is not controlled by your nausea medication, call the clinic.   BELOW ARE SYMPTOMS THAT SHOULD BE REPORTED IMMEDIATELY:  *FEVER GREATER THAN 100.5 F  *CHILLS WITH OR WITHOUT FEVER  NAUSEA AND VOMITING THAT IS NOT CONTROLLED WITH YOUR NAUSEA MEDICATION  *UNUSUAL SHORTNESS OF BREATH  *UNUSUAL BRUISING OR BLEEDING  TENDERNESS IN MOUTH AND THROAT WITH OR WITHOUT PRESENCE OF ULCERS  *URINARY PROBLEMS  *BOWEL PROBLEMS  UNUSUAL RASH Items with * indicate a potential emergency and should be followed up as soon as possible.  Feel free to call the clinic you have any questions or concerns. The clinic phone number is [phone].  

## 2014-01-21 ENCOUNTER — Ambulatory Visit (HOSPITAL_BASED_OUTPATIENT_CLINIC_OR_DEPARTMENT_OTHER): Payer: Medicare Other

## 2014-01-21 ENCOUNTER — Other Ambulatory Visit: Payer: Self-pay | Admitting: Internal Medicine

## 2014-01-21 ENCOUNTER — Telehealth: Payer: Self-pay | Admitting: Internal Medicine

## 2014-01-21 ENCOUNTER — Telehealth: Payer: Self-pay | Admitting: *Deleted

## 2014-01-21 VITALS — BP 138/72 | HR 65 | Temp 98.3°F | Resp 18

## 2014-01-21 DIAGNOSIS — Z5112 Encounter for antineoplastic immunotherapy: Secondary | ICD-10-CM

## 2014-01-21 DIAGNOSIS — C9000 Multiple myeloma not having achieved remission: Secondary | ICD-10-CM

## 2014-01-21 MED ORDER — ZOLEDRONIC ACID 4 MG/5ML IV CONC
3.5000 mg | Freq: Once | INTRAVENOUS | Status: AC
  Administered 2014-01-21: 3.5 mg via INTRAVENOUS
  Filled 2014-01-21: qty 4.38

## 2014-01-21 MED ORDER — DEXTROSE 5 % IV SOLN
20.0000 mg/m2 | Freq: Once | INTRAVENOUS | Status: AC
  Administered 2014-01-21: 38 mg via INTRAVENOUS
  Filled 2014-01-21: qty 19

## 2014-01-21 MED ORDER — DEXAMETHASONE SODIUM PHOSPHATE 10 MG/ML IJ SOLN
INTRAMUSCULAR | Status: AC
  Filled 2014-01-21: qty 1

## 2014-01-21 MED ORDER — DEXAMETHASONE SODIUM PHOSPHATE 10 MG/ML IJ SOLN
10.0000 mg | Freq: Once | INTRAMUSCULAR | Status: AC
  Administered 2014-01-21: 10 mg via INTRAVENOUS

## 2014-01-21 MED ORDER — ONDANSETRON 8 MG/NS 50 ML IVPB
INTRAVENOUS | Status: AC
  Filled 2014-01-21: qty 8

## 2014-01-21 MED ORDER — SODIUM CHLORIDE 0.9 % IV SOLN
Freq: Once | INTRAVENOUS | Status: AC
  Administered 2014-01-21: 12:00:00 via INTRAVENOUS

## 2014-01-21 MED ORDER — ONDANSETRON 8 MG/50ML IVPB (CHCC)
8.0000 mg | Freq: Once | INTRAVENOUS | Status: AC
  Administered 2014-01-21: 8 mg via INTRAVENOUS

## 2014-01-21 NOTE — Patient Instructions (Signed)
[facility] Discharge Instructions for Patients Receiving Chemotherapy  Today you received the following chemotherapy agents Kyprolis and Zometa.  To help prevent nausea and vomiting after your treatment, we encourage you to take your nausea medication.   If you develop nausea and vomiting that is not controlled by your nausea medication, call the clinic.   BELOW ARE SYMPTOMS THAT SHOULD BE REPORTED IMMEDIATELY:  *FEVER GREATER THAN 100.5 F  *CHILLS WITH OR WITHOUT FEVER  NAUSEA AND VOMITING THAT IS NOT CONTROLLED WITH YOUR NAUSEA MEDICATION  *UNUSUAL SHORTNESS OF BREATH  *UNUSUAL BRUISING OR BLEEDING  TENDERNESS IN MOUTH AND THROAT WITH OR WITHOUT PRESENCE OF ULCERS  *URINARY PROBLEMS  *BOWEL PROBLEMS  UNUSUAL RASH Items with * indicate a potential emergency and should be followed up as soon as possible.  Feel free to call the clinic you have any questions or concerns. The clinic phone number is [phone].

## 2014-01-21 NOTE — Telephone Encounter (Signed)
gve the pt his June 2015 appt calendar. Sent michelle an email  To schedule the chemo appts for 6/10 and 01/28/2014.

## 2014-01-21 NOTE — Telephone Encounter (Signed)
Per staff message and POF I have scheduled appts.  JMW  

## 2014-01-22 ENCOUNTER — Telehealth: Payer: Self-pay | Admitting: Internal Medicine

## 2014-01-22 NOTE — Telephone Encounter (Signed)
S/w the pt and he is aware of his appts on 01/27/2014 and 01/28/2014.

## 2014-01-26 ENCOUNTER — Other Ambulatory Visit: Payer: Self-pay | Admitting: Internal Medicine

## 2014-01-26 ENCOUNTER — Other Ambulatory Visit: Payer: Self-pay | Admitting: *Deleted

## 2014-01-26 DIAGNOSIS — C9000 Multiple myeloma not having achieved remission: Secondary | ICD-10-CM

## 2014-01-26 NOTE — Telephone Encounter (Signed)
THIS REFILL REQUEST FOR REVLIMID WAS GIVEN TO DR.CHISM'S NURSE, ROBIN BASS,RN. 

## 2014-01-27 ENCOUNTER — Other Ambulatory Visit (HOSPITAL_BASED_OUTPATIENT_CLINIC_OR_DEPARTMENT_OTHER): Payer: Medicare Other

## 2014-01-27 ENCOUNTER — Other Ambulatory Visit: Payer: Self-pay | Admitting: Internal Medicine

## 2014-01-27 ENCOUNTER — Ambulatory Visit (HOSPITAL_BASED_OUTPATIENT_CLINIC_OR_DEPARTMENT_OTHER): Payer: Medicare Other

## 2014-01-27 VITALS — BP 163/75 | HR 63 | Temp 98.4°F | Resp 18

## 2014-01-27 DIAGNOSIS — C9000 Multiple myeloma not having achieved remission: Secondary | ICD-10-CM

## 2014-01-27 DIAGNOSIS — Z5112 Encounter for antineoplastic immunotherapy: Secondary | ICD-10-CM

## 2014-01-27 LAB — COMPREHENSIVE METABOLIC PANEL (CC13)
ALBUMIN: 3.6 g/dL (ref 3.5–5.0)
ALK PHOS: 101 U/L (ref 40–150)
ALT: 15 U/L (ref 0–55)
AST: 15 U/L (ref 5–34)
Anion Gap: 10 mEq/L (ref 3–11)
BUN: 18.4 mg/dL (ref 7.0–26.0)
CALCIUM: 8.6 mg/dL (ref 8.4–10.4)
CHLORIDE: 103 meq/L (ref 98–109)
CO2: 27 mEq/L (ref 22–29)
Creatinine: 1 mg/dL (ref 0.7–1.3)
GLUCOSE: 76 mg/dL (ref 70–140)
Potassium: 3.6 mEq/L (ref 3.5–5.1)
SODIUM: 140 meq/L (ref 136–145)
TOTAL PROTEIN: 6.1 g/dL — AB (ref 6.4–8.3)
Total Bilirubin: 0.46 mg/dL (ref 0.20–1.20)

## 2014-01-27 LAB — CBC WITH DIFFERENTIAL/PLATELET
BASO%: 0.5 % (ref 0.0–2.0)
BASOS ABS: 0 10*3/uL (ref 0.0–0.1)
EOS ABS: 0.4 10*3/uL (ref 0.0–0.5)
EOS%: 13.2 % — ABNORMAL HIGH (ref 0.0–7.0)
HCT: 39.3 % (ref 38.4–49.9)
HEMOGLOBIN: 13.1 g/dL (ref 13.0–17.1)
LYMPH#: 0.6 10*3/uL — AB (ref 0.9–3.3)
LYMPH%: 18.5 % (ref 14.0–49.0)
MCH: 33.3 pg (ref 27.2–33.4)
MCHC: 33.2 g/dL (ref 32.0–36.0)
MCV: 100.3 fL — ABNORMAL HIGH (ref 79.3–98.0)
MONO#: 1 10*3/uL — ABNORMAL HIGH (ref 0.1–0.9)
MONO%: 31.8 % — AB (ref 0.0–14.0)
NEUT%: 36 % — ABNORMAL LOW (ref 39.0–75.0)
NEUTROS ABS: 1.1 10*3/uL — AB (ref 1.5–6.5)
Platelets: 125 10*3/uL — ABNORMAL LOW (ref 140–400)
RBC: 3.92 10*6/uL — AB (ref 4.20–5.82)
RDW: 14.9 % — AB (ref 11.0–14.6)
WBC: 3.1 10*3/uL — AB (ref 4.0–10.3)

## 2014-01-27 LAB — LACTATE DEHYDROGENASE (CC13): LDH: 170 U/L (ref 125–245)

## 2014-01-27 MED ORDER — ONDANSETRON 8 MG/50ML IVPB (CHCC)
8.0000 mg | Freq: Once | INTRAVENOUS | Status: AC
  Administered 2014-01-27: 8 mg via INTRAVENOUS

## 2014-01-27 MED ORDER — SODIUM CHLORIDE 0.9 % IV SOLN
Freq: Once | INTRAVENOUS | Status: AC
  Administered 2014-01-27: 15:00:00 via INTRAVENOUS

## 2014-01-27 MED ORDER — DEXAMETHASONE SODIUM PHOSPHATE 10 MG/ML IJ SOLN
10.0000 mg | Freq: Once | INTRAMUSCULAR | Status: AC
  Administered 2014-01-27: 10 mg via INTRAVENOUS

## 2014-01-27 MED ORDER — LENALIDOMIDE 10 MG PO CAPS: 10.0000 mg | ORAL_CAPSULE | Freq: Every day | ORAL | Status: DC

## 2014-01-27 MED ORDER — DEXTROSE 5 % IV SOLN
15.0000 mg/m2 | Freq: Once | INTRAVENOUS | Status: AC
  Administered 2014-01-27: 28 mg via INTRAVENOUS
  Filled 2014-01-27: qty 14

## 2014-01-27 MED ORDER — ONDANSETRON 8 MG/NS 50 ML IVPB
INTRAVENOUS | Status: AC
  Filled 2014-01-27: qty 8

## 2014-01-27 MED ORDER — DEXAMETHASONE SODIUM PHOSPHATE 10 MG/ML IJ SOLN
INTRAMUSCULAR | Status: AC
  Filled 2014-01-27: qty 1

## 2014-01-27 NOTE — Addendum Note (Signed)
Addended by: Tania Ade on: 01/27/2014 11:10 AM   Modules accepted: Orders

## 2014-01-27 NOTE — Patient Instructions (Addendum)
[facility] Discharge Instructions for Patients Receiving Chemotherapy  Today you received the following chemotherapy agents: kyprolis  To help prevent nausea and vomiting after your treatment, we encourage you to take your nausea medication.  Take it as often as prescribed.     If you develop nausea and vomiting that is not controlled by your nausea medication, call the clinic. If it is after clinic hours your family physician or the after hours number for the clinic or go to the Emergency Department.   BELOW ARE SYMPTOMS THAT SHOULD BE REPORTED IMMEDIATELY:  *FEVER GREATER THAN 100.5 F  *CHILLS WITH OR WITHOUT FEVER  NAUSEA AND VOMITING THAT IS NOT CONTROLLED WITH YOUR NAUSEA MEDICATION  *UNUSUAL SHORTNESS OF BREATH  *UNUSUAL BRUISING OR BLEEDING  TENDERNESS IN MOUTH AND THROAT WITH OR WITHOUT PRESENCE OF ULCERS  *URINARY PROBLEMS  *BOWEL PROBLEMS  UNUSUAL RASH Items with * indicate a potential emergency and should be followed up as soon as possible.  Feel free to call the clinic you have any questions or concerns. The clinic phone number is [phone].   I have been informed and understand all the instructions given to me. I know to contact the clinic, my physician, or go to the Emergency Department if any problems should occur. I do not have any questions at this time, but understand that I may call the clinic during office hours   should I have any questions or need assistance in obtaining follow up care.    __________________________________________  _____________  __________ Signature of Patient or Authorized Representative            Date                   Time    __________________________________________ Nurse's Signature    b

## 2014-01-28 ENCOUNTER — Ambulatory Visit (HOSPITAL_BASED_OUTPATIENT_CLINIC_OR_DEPARTMENT_OTHER): Payer: Medicare Other

## 2014-01-28 VITALS — BP 159/65 | HR 72 | Temp 97.5°F | Resp 16

## 2014-01-28 DIAGNOSIS — C9000 Multiple myeloma not having achieved remission: Secondary | ICD-10-CM

## 2014-01-28 DIAGNOSIS — Z5112 Encounter for antineoplastic immunotherapy: Secondary | ICD-10-CM

## 2014-01-28 MED ORDER — SODIUM CHLORIDE 0.9 % IV SOLN
Freq: Once | INTRAVENOUS | Status: AC
  Administered 2014-01-28: 14:00:00 via INTRAVENOUS

## 2014-01-28 MED ORDER — ONDANSETRON 8 MG/50ML IVPB (CHCC)
8.0000 mg | Freq: Once | INTRAVENOUS | Status: AC
  Administered 2014-01-28: 8 mg via INTRAVENOUS

## 2014-01-28 MED ORDER — DEXTROSE 5 % IV SOLN
15.0000 mg/m2 | Freq: Once | INTRAVENOUS | Status: AC
  Administered 2014-01-28: 28 mg via INTRAVENOUS
  Filled 2014-01-28: qty 14

## 2014-01-28 MED ORDER — DEXAMETHASONE SODIUM PHOSPHATE 10 MG/ML IJ SOLN
INTRAMUSCULAR | Status: AC
  Filled 2014-01-28: qty 1

## 2014-01-28 MED ORDER — DEXAMETHASONE SODIUM PHOSPHATE 10 MG/ML IJ SOLN
10.0000 mg | Freq: Once | INTRAMUSCULAR | Status: AC
  Administered 2014-01-28: 10 mg via INTRAVENOUS

## 2014-01-28 MED ORDER — ONDANSETRON 8 MG/NS 50 ML IVPB
INTRAVENOUS | Status: AC
  Filled 2014-01-28: qty 8

## 2014-01-28 NOTE — Patient Instructions (Signed)
[facility] Discharge Instructions for Patients Receiving Chemotherapy  Today you received the following chemotherapy agents: Kyprolis  To help prevent nausea and vomiting after your treatment, we encourage you to take your nausea medication: Compazine 10 mg every 6 hrs as needed.    If you develop nausea and vomiting that is not controlled by your nausea medication, call the clinic.   BELOW ARE SYMPTOMS THAT SHOULD BE REPORTED IMMEDIATELY:  *FEVER GREATER THAN 100.5 F  *CHILLS WITH OR WITHOUT FEVER  NAUSEA AND VOMITING THAT IS NOT CONTROLLED WITH YOUR NAUSEA MEDICATION  *UNUSUAL SHORTNESS OF BREATH  *UNUSUAL BRUISING OR BLEEDING  TENDERNESS IN MOUTH AND THROAT WITH OR WITHOUT PRESENCE OF ULCERS  *URINARY PROBLEMS  *BOWEL PROBLEMS  UNUSUAL RASH Items with * indicate a potential emergency and should be followed up as soon as possible.  Feel free to call the clinic you have any questions or concerns. The clinic phone number is [phone].

## 2014-02-02 ENCOUNTER — Ambulatory Visit (HOSPITAL_BASED_OUTPATIENT_CLINIC_OR_DEPARTMENT_OTHER): Payer: Medicare Other | Admitting: Internal Medicine

## 2014-02-02 ENCOUNTER — Ambulatory Visit: Payer: Medicare Other

## 2014-02-02 ENCOUNTER — Encounter: Payer: Self-pay | Admitting: Internal Medicine

## 2014-02-02 ENCOUNTER — Other Ambulatory Visit (HOSPITAL_BASED_OUTPATIENT_CLINIC_OR_DEPARTMENT_OTHER): Payer: Medicare Other

## 2014-02-02 ENCOUNTER — Telehealth: Payer: Self-pay | Admitting: *Deleted

## 2014-02-02 VITALS — BP 155/69 | HR 65 | Temp 98.4°F | Resp 18 | Ht 69.0 in | Wt 158.5 lb

## 2014-02-02 DIAGNOSIS — C9000 Multiple myeloma not having achieved remission: Secondary | ICD-10-CM

## 2014-02-02 LAB — COMPREHENSIVE METABOLIC PANEL (CC13)
ALT: 18 U/L (ref 0–55)
AST: 14 U/L (ref 5–34)
Albumin: 3.6 g/dL (ref 3.5–5.0)
Alkaline Phosphatase: 89 U/L (ref 40–150)
Anion Gap: 9 mEq/L (ref 3–11)
BILIRUBIN TOTAL: 0.47 mg/dL (ref 0.20–1.20)
BUN: 19.5 mg/dL (ref 7.0–26.0)
CO2: 30 mEq/L — ABNORMAL HIGH (ref 22–29)
CREATININE: 1.1 mg/dL (ref 0.7–1.3)
Calcium: 8.3 mg/dL — ABNORMAL LOW (ref 8.4–10.4)
Chloride: 103 mEq/L (ref 98–109)
Glucose: 74 mg/dl (ref 70–140)
Potassium: 3.8 mEq/L (ref 3.5–5.1)
Sodium: 142 mEq/L (ref 136–145)
Total Protein: 6 g/dL — ABNORMAL LOW (ref 6.4–8.3)

## 2014-02-02 LAB — CBC WITH DIFFERENTIAL/PLATELET
BASO%: 0 % (ref 0.0–2.0)
Basophils Absolute: 0 10*3/uL (ref 0.0–0.1)
EOS%: 7 % (ref 0.0–7.0)
Eosinophils Absolute: 0.2 10*3/uL (ref 0.0–0.5)
HCT: 37.6 % — ABNORMAL LOW (ref 38.4–49.9)
HGB: 12.8 g/dL — ABNORMAL LOW (ref 13.0–17.1)
LYMPH%: 20.2 % (ref 14.0–49.0)
MCH: 33.1 pg (ref 27.2–33.4)
MCHC: 34 g/dL (ref 32.0–36.0)
MCV: 97.2 fL (ref 79.3–98.0)
MONO#: 0.9 10*3/uL (ref 0.1–0.9)
MONO%: 27.8 % — AB (ref 0.0–14.0)
NEUT#: 1.5 10*3/uL (ref 1.5–6.5)
NEUT%: 45 % (ref 39.0–75.0)
NRBC: 0 % (ref 0–0)
PLATELETS: 164 10*3/uL (ref 140–400)
RBC: 3.87 10*6/uL — ABNORMAL LOW (ref 4.20–5.82)
RDW: 13.6 % (ref 11.0–14.6)
WBC: 3.3 10*3/uL — AB (ref 4.0–10.3)
lymph#: 0.7 10*3/uL — ABNORMAL LOW (ref 0.9–3.3)

## 2014-02-02 NOTE — Telephone Encounter (Signed)
Per staff message and POF I have scheduled appts. Advised scheduler of appts. JMW  

## 2014-02-02 NOTE — Patient Instructions (Signed)

## 2014-02-02 NOTE — Progress Notes (Signed)
[facility] OFFICE PROGRESS NOTE  Adella Hare, MD [address] [place] [address]  DIAGNOSIS: Multiple myeloma - Plan: CBC with Differential, Comprehensive metabolic panel (Cmet) - [facility], Lactate dehydrogenase (LDH) - [facility], CBC with Differential, Basic metabolic panel (Bmet) - [facility], CBC with Differential, Basic metabolic panel (Bmet) - [facility], CBC with Differential, Basic metabolic panel (Bmet) - [facility]  Chief Complaint  Patient presents with  . Multiple Myeloma    CURRENT THERAPY:   Aggressive salvage therapy with carfilzomib/revlimid/dexa started on 10/28/2013.    Multiple myeloma   05/21/2012 Initial Diagnosis Multiple myeloma, ISS stage III.   06/19/2012 Imaging Lytic lesions in the calvaria and right hemipelvis and a possible lesion in the right humerus.    06/20/2012 Imaging CT scan of the right humerus showed a 2.3 cm lucent lesion with probable mild endosteal thinning anteriorly near the bicipital groove.  several small lytic lesions in humeral head, scapula, glenoid, and proximal radius.     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 Plasma cells are lambda ligh chain restricted.    06/24/2012 Pathology Cytogenetics revealed the presence of normal male chormosomes with no observable clonal abnormalities.  Single cell with additional chromosome material on 3q, loss of 13 .  DNA, gain of chromosome 11.    07/01/2012 - 01/02/2013 Chemotherapy Velcade, cytoxan and decadron. doses were increased on 11/06/2012.  Neulasta was started on 08/16/2012 because of leukopenia.    07/10/2012 - 07/28/2012 Radiation Therapy He received XRT to the right humerus 25 Gy in 10 fractions.    12/04/2012 Bone Marrow Biopsy Hypercllular for age with trilineage hematopoiesis in addition to increased number of atypical plasma cells estimated at 24% of all cells in the aspirate. Lamda light chain  restriction. rare circulating plasma cells.   12/04/2012 Pathology Cytogenetics revealed the presence of 2 clonal cell lines. first line with chromosal normal (25%).  2nd line was abnormal, missing a Y chromosome. 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 FISH showed gain of 11.    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  S/p melphalan 140 mg /m2 on 01/27/2013.    06/27/2013 Tumor Marker 24-hour urine yielded 17.1 gram of protein of which 16.9 grams was free lambda light chains.  Beta 2 microglobulin was 5.8. Albumin 3.2. IgG 6,320.    07/07/2013 - 09/01/2013 Chemotherapy Maintenance velcade started.   09/21/2013 Progression Counts decreasing; WBC followed by Plts. Referred to [facility] for recommendations for next therapy.    10/12/2013 Bone Marrow Biopsy Done at [facility].  Per care everywhere, PLASMA CELLS COMPRISE 80% OF A PACKED BONE MARROW.    10/22/2013 Tumor Marker Labs done at [facility].  spep m-spike 0.08, FLC lambda 379.00, ratio 0. 24 hour urine, m-spike 16632, IFE positive for monoclonal lambda.   10/27/2013 -  Chemotherapy Started salvage chemotherapy with carlfozomib plus dexamethasone plus revlimid.    11/09/2013 - 11/17/2013 Hospital Admission Admitted with fever.  Started on broad spectrum antibiotics. Had severe anemia and was give hemoglobin with questionable tranfusion reaction. 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 Discharged off antibiotics.      INTERVAL HISTORY: Trevor Michael 73 y.o. 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  He was last seen by Awilda Metro on 01/06/2014.  As noted above he started salvage chemotherapy.  This was delayed due  to recent hospitalization for fever further complicated by ICU level care due to profound hypotension.  He was discharged with recovering acute kidney injury likely acute tubular necrosis with complete resolution.   Today, he is accompanied by his sister.  He reports improvement in his energy overall. H  His weight is stable.  He denies bleeding and night sweats.  He saw  his opthalmalogist last month with recommendations to continue his eye drops daily. He tolerated cycle #3 of his combination chemotherapy without difficulty and no hospitalizations or emergency room visits.   MEDICAL HISTORY: Past Medical History  Diagnosis Date  . Other and unspecified hyperlipidemia   . Essential hypertension, benign   . BPH (benign prostatic hyperplasia)   . Early stage glaucoma   . Heart murmur     "since my teens" (06/18/2012)  . Cervical stenosis of spine   . GERD (gastroesophageal reflux disease)   . Bone cancer     multiple bone lesion/right humerus  . Spinal stenosis   . Chronic kidney disease     renal insufficiency  . Arthritis   . DDD (degenerative disc disease)     c-5=-c6, and spondylosis  . Cancer 05/2012    IgA lambda multiple myeloma  . Anorexia 06/25/12    1 month hx   . Diverticulitis   . History of chemotherapy 07/01/12    Velcade,Cytoxin and decadron    INTERIM HISTORY: has HYPERLIPIDEMIA, MILD; HYPERTENSION, MILD; BENIGN PROSTATIC HYPERTROPHY; Routine health maintenance; Rhinitis, non-allergic; Multiple myeloma; Bone cancer; Fever; Syncope; Dehydration; Acute renal failure; Acute hemolysis-non immune; Nausea & vomiting; Pancytopenia due to chemotherapy; Pulmonary edema; and Elevated troponin on his problem list.    ALLERGIES:  has No Known Allergies.  MEDICATIONS: has a current medication list which includes the following prescription(s): acetaminophen, acyclovir, allopurinol, aspirin ec, atenolol-chlorthalidone, bimatoprost, brimonidine-timolol, dexamethasone, esomeprazole, feeding supplement (resource breeze), folic acid, lenalidomide, loperamide, loratadine, potassium chloride sa, prochlorperazine, senna, simvastatin, sodium bicarbonate, and tamsulosin.  SURGICAL HISTORY:  Past  Surgical History  Procedure Laterality Date  . Lipoma excision  07/1986    left shoulder/scapula  . Esophagogastroduodenoscopy  06/20/2012    Procedure: ESOPHAGOGASTRODUODENOSCOPY (EGD);  Surgeon: Beryle Beams, MD;  Location[facility] ENDOSCOPY;  Service: Endoscopy;  Laterality: N/A;  . Prostate biopsy  2008    benign   PROBLEM LIST:  1. Renal insufficiency. Creatinine clearance on 06/27/2012 was 68 mL/min.  2. A 2.3 cm lytic lesion in the right humerus at risk for pathologic fracture. Radiation treatments were administered to the right humerus 25 Gy in 10 fractions from 07/10/2012 through 07/28/2012.  3. Hypertension.  4. Benign prostate hypertrophy and elevated prostate specific antigen, under the care of Dr. Rana Snare. The patient had a prostate biopsy in 2008 that was benign.  5. Dyslipidemia.  6. Arthritis involving the cervical spine  7. Glaucoma.   REVIEW OF SYSTEMS:   Constitutional: Denies fevers, chills but reports some weight loss since recent hospitalization but it is leveling off Eyes: Denies blurriness of vision Ears, nose, mouth, throat, and face: Denies mucositis or sore throat Respiratory: Denies cough, dyspnea or wheezes Cardiovascular: Denies palpitation, chest discomfort or lower extremity swelling Gastrointestinal:  Denies nausea, heartburn or change in bowel habits Skin: Denies abnormal skin rashes Lymphatics: Denies new lymphadenopathy or easy bruising Neurological:Denies numbness, tingling or new weaknesses Behavioral/Psych: Mood is stable, no new changes  All other systems were reviewed with the patient and are negative.  PHYSICAL EXAMINATION: ECOG PERFORMANCE STATUS: 0 - Asymptomatic  Blood pressure 155/69, pulse 65, temperature 98.4 F (36.9 C), temperature source Oral, resp. rate 18, height 5' 9"  (1.753 m), weight 158 lb 8 oz (71.895 kg), SpO2 100.00%.  GENERAL:alert, no distress and comfortable; thin, chronically ill appearing. SKIN: skin color,  texture, turgor are normal, no rashes or significant lesions EYES: normal, Conjunctiva are pink and non-injected, sclera clear OROPHARYNX:no exudate, no erythema and lips, buccal mucosa, and tongue normal  NECK: supple, thyroid normal size, non-tender, without nodularity LYMPH:  no palpable lymphadenopathy in the cervical, axillary or supraclavicular LUNGS: clear to auscultation and percussion with normal breathing effort HEART: regular rate & rhythm and no murmurs and no lower extremity edema ABDOMEN:abdomen soft, non-tender and normal bowel sounds Musculoskeletal:no cyanosis of digits and no clubbing  NEURO: alert & oriented x 3 with fluent speech, no focal motor/sensory deficits  Labs:  Lab Results  Component Value Date   WBC 3.3* 02/02/2014   HGB 12.8* 02/02/2014   HCT 37.6* 02/02/2014   MCV 97.2 02/02/2014   PLT 164 02/02/2014   NEUTROABS 1.5 02/02/2014      Chemistry      Component Value Date/Time   NA 142 02/02/2014 1043   NA 138 11/17/2013 0650   K 3.8 02/02/2014 1043   K 4.1 11/17/2013 0650   CL 101 11/17/2013 0650   CL 102 12/05/2012 0845   CO2 30* 02/02/2014 1043   CO2 23 11/17/2013 0650   BUN 19.5 02/02/2014 1043   BUN 44* 11/17/2013 0650   CREATININE 1.1 02/02/2014 1043   CREATININE 3.22* 11/17/2013 0650   CREATININE 1.88* 06/27/2012 1039      Component Value Date/Time   CALCIUM 8.3* 02/02/2014 1043   CALCIUM 6.8* 11/17/2013 0650   ALKPHOS 89 02/02/2014 1043   ALKPHOS 78 11/16/2013 0650   AST 14 02/02/2014 1043   AST 23 11/16/2013 0650   ALT 18 02/02/2014 1043   ALT 11 11/16/2013 0650   BILITOT 0.47 02/02/2014 1043   BILITOT 0.8 11/16/2013 0650      Results for ISAMI, MEHRA (MRN [id number]) as of 02/02/2014 13:00  Ref. Range 01/08/2014 13:41  Time-UPE24 No range found 24  Volume, Urine-UPE24 No range found 1400  Total Protein, Urine-UPE24 No range found 4.8  Total Protein, Urine-Ur/day Latest Range: 10-140 mg/day 67  ALBUMIN, U Latest Range: DETECTED  DETECTED  Alpha 1,  Urine Latest Range: NONE DET  DETECTED (A)  Alpha 2, Urine Latest Range: NONE DET  DETECTED (A)  Beta, Urine Latest Range: NONE DET  DETECTED (A)  Gamma Globulin, Urine Latest Range: NONE DET  DETECTED (A)  Free Kappa Lt Chains,Ur Latest Range: 0.14-2.42 mg/dL 0.72  Free Lt Chn Excr Rate No range found 10.08  Free Lambda Lt Chains,Ur Latest Range: 0.02-0.67 mg/dL 3.41 (H)  Free Lambda Excretion/Day No range found 47.74  Free Kappa/Lambda Ratio Latest Range: 2.04-10.37 ratio 0.21 (L)   Results for YOSTIN, MALACARA (MRN [id number]) as of 02/02/2014 13:00  Ref. Range 01/06/2014 11:28  Albumin ELP Latest Range: 55.8-66.1 % 64.0  COMMENT (PROTEIN ELECTROPHOR) No range found *  Alpha-1-Globulin Latest Range: 2.9-4.9 % 4.8  Alpha-2-Globulin Latest Range: 7.1-11.8 % 11.3  Beta Globulin Latest Range: 4.7-7.2 % 6.3  Beta 2 Latest Range: 3.2-6.5 % 4.5  Gamma Globulin Latest Range: 11.1-18.8 % 9.1 (L)  M-SPIKE, % No range found NOT DET  SPE Interp. No range found *  IgG (Immunoglobin G), Serum Latest Range: 435 679 8185 mg/dL 565 (L)  IgA Latest Range: 68-379 mg/dL  69  IgM, Serum Latest Range: 41-251 mg/dL 13 (L)  Total Protein, Serum Electrophoresis Latest Range: 6.0-8.3 g/dL 6.1   RADIOGRAPHIC STUDIES: 1. MRI of the cervical spine without IV contrast on 11/07/2009 showed disk degeneration and spondylosis, most severe at C5-C6 where there is mild to moderate spinal stenosis. There is foraminal encroachment at this level, left greater than right. There were degenerative changes at other levels which were described in detail in the body of the report.  2. Chest x-ray, 2 view, from 11/16/2009 showed no acute findings.  3. CT abdomen and pelvis without IV contrast on 06/18/2012 showed multiple lytic bone lesions highly suspicious for metastatic disease or multiple myeloma. The largest lesion seen with some cortical destruction in the anterior aspect of the iliac bone measured 3 cm. There was no evidence of  colitis or diverticulitis. There was no evidence for hydronephrosis or hydroureter. There were bilateral probable renal cysts. There was an enlarged prostate gland with indentation of the urinary bladder base. There was a small right hydrocele.  4. Metastatic bone survey from 06/19/2012 showed lytic lesions in the calvaria and right hemipelvis and a possible lesion in the right humerus. This latter lesion was not specifically mentioned in the report.  5. Nuclear bone scan on 06/19/2012 showed some increased uptake involving the femur and humerus bilaterally, possibly reflecting changes of metabolic bone disease. There was a lack of correlation with the bone scan and the findings seen on CT and x-rays, raising the possibility of multiple myeloma.  6. Renal ultrasound on 06/19/2012 showed increased echogenicity of the renal parenchyma bilaterally consistent with renal medical disease. There was no evidence for obstruction. There was a slightly enlarged prostate gland.  7. CT scan of the right humerus without IV contrast on 06/20/2012 showed 2.3-cm lucent lesion with probable mild endosteal thinning anteriorly near the bicipital groove. There were several small lytic lesions present within the humeral head, scapula, glenoid, and proximal radius. No pathologic fracture or soft tissue mass was seen. There were a few small nodules within the visualized right lung, the largest of which was 4 mm in the upper lobe on image 78. 8. Metastatic bone survey (07/22/2011) demonstrates multiple new lytic lesions are noted with innumerable lesions noted throughout the skull, left scapula, left humerus, and both femurs. These findings are indicative of progressive myeloma. 9. Metastatic bone survey (07/21/2013) demonstrates multiple new lytic lesions are noted with innumerable lesions noted throughout the skull, left scapula, left humerus, and both femurs.  These findings are indicative of progressive myeloma.   PROCEDURES:   1. CT-guided bone marrow aspirate and biopsy was carried out on 06/24/2012 and showed 48% plasma cells.  2. Bone marrow aspirate and biopsy was carried out on 12/04/2012 and showed infiltration with 24% plasma cells.   ASSESSMENT: LEMON STERNBERG 73 y.o. male with a history of Multiple myeloma - Plan: CBC with Differential, Comprehensive metabolic panel (Cmet) - [facility], Lactate dehydrogenase (LDH) - [facility], CBC with Differential, Basic metabolic panel (Bmet) - [facility], CBC with Differential, Basic metabolic panel (Bmet) - [facility], CBC with Differential, Basic metabolic panel (Bmet) - [facility]  PLAN:  1. Stage III IgA lambda multiple myeloma S/p auto SCT, with rapid progression while on maitenance velcade now in a VGPR --Mr. Pilz is continuing to do well clinically. He has been on  aggressive salvage therapy with carfilzomib/revlimid/dexa since 10/28/2013 as noted below:    --Carfilzomib 20 mg/m2 IV on days 1, 2, 8, 9, 15, 16 every 28 days;   --  Lenalidomide 10 mg on day 1-21 of all cycles; (We plan to titrate up 25 mg based on his count recovery);   --Dexamethasone 40 mg for cycles 1-4 weekly .   His MM markers appear to improve with no detectable M-spike and low total urine protein on 67 mg/day.   He is scheduled for cycle #4 starting on 06/24.   We decreased his carfilzomib to 15 mg/m2 due delayed count recovery.  He will see Dr. Samule Ohm of [place] [facility] on 02/04/2014 for determination of whether to continue current regiment or start 2-drug therapy with Rd.  He has not had a recent bone marrow biopsy.    2. Follow-up -- We will schedule a lab and then start chemotherapy on 06/24 and repeat visit on 07/15.   We will check MM markers on 07/08.   All questions were answered. The patient knows to call the clinic with any problems, questions or concerns. We can certainly see the patient much sooner if necessary.  I spent 15 minutes counseling the patient face to face. The total time spent in the appointment was 25  minutes.    CHISM, DAVID, MD 02/02/2014 1:05 PM

## 2014-02-03 ENCOUNTER — Ambulatory Visit: Payer: Medicare Other

## 2014-02-10 ENCOUNTER — Other Ambulatory Visit (HOSPITAL_BASED_OUTPATIENT_CLINIC_OR_DEPARTMENT_OTHER): Payer: Medicare Other

## 2014-02-10 ENCOUNTER — Other Ambulatory Visit: Payer: Self-pay | Admitting: Internal Medicine

## 2014-02-10 ENCOUNTER — Ambulatory Visit (HOSPITAL_BASED_OUTPATIENT_CLINIC_OR_DEPARTMENT_OTHER): Payer: Medicare Other

## 2014-02-10 VITALS — BP 164/67 | HR 58 | Temp 97.7°F | Resp 18

## 2014-02-10 DIAGNOSIS — C9000 Multiple myeloma not having achieved remission: Secondary | ICD-10-CM

## 2014-02-10 DIAGNOSIS — Z5112 Encounter for antineoplastic immunotherapy: Secondary | ICD-10-CM

## 2014-02-10 LAB — CBC WITH DIFFERENTIAL/PLATELET
BASO%: 1.2 % (ref 0.0–2.0)
Basophils Absolute: 0 10*3/uL (ref 0.0–0.1)
EOS%: 4.2 % (ref 0.0–7.0)
Eosinophils Absolute: 0.1 10*3/uL (ref 0.0–0.5)
HEMATOCRIT: 38.8 % (ref 38.4–49.9)
HGB: 13 g/dL (ref 13.0–17.1)
LYMPH%: 26.4 % (ref 14.0–49.0)
MCH: 32.9 pg (ref 27.2–33.4)
MCHC: 33.5 g/dL (ref 32.0–36.0)
MCV: 98.2 fL — AB (ref 79.3–98.0)
MONO#: 0.8 10*3/uL (ref 0.1–0.9)
MONO%: 27 % — AB (ref 0.0–14.0)
NEUT#: 1.2 10*3/uL — ABNORMAL LOW (ref 1.5–6.5)
NEUT%: 41.2 % (ref 39.0–75.0)
PLATELETS: 166 10*3/uL (ref 140–400)
RBC: 3.95 10*6/uL — ABNORMAL LOW (ref 4.20–5.82)
RDW: 15.2 % — ABNORMAL HIGH (ref 11.0–14.6)
WBC: 3 10*3/uL — AB (ref 4.0–10.3)
lymph#: 0.8 10*3/uL — ABNORMAL LOW (ref 0.9–3.3)

## 2014-02-10 LAB — BASIC METABOLIC PANEL (CC13)
ANION GAP: 8 meq/L (ref 3–11)
BUN: 16.9 mg/dL (ref 7.0–26.0)
CALCIUM: 9.1 mg/dL (ref 8.4–10.4)
CO2: 28 mEq/L (ref 22–29)
CREATININE: 1 mg/dL (ref 0.7–1.3)
Chloride: 106 mEq/L (ref 98–109)
GLUCOSE: 100 mg/dL (ref 70–140)
Potassium: 3.3 mEq/L — ABNORMAL LOW (ref 3.5–5.1)
Sodium: 143 mEq/L (ref 136–145)

## 2014-02-10 MED ORDER — DEXAMETHASONE SODIUM PHOSPHATE 10 MG/ML IJ SOLN
INTRAMUSCULAR | Status: AC
  Filled 2014-02-10: qty 1

## 2014-02-10 MED ORDER — SODIUM CHLORIDE 0.9 % IV SOLN
Freq: Once | INTRAVENOUS | Status: AC
  Administered 2014-02-10: 13:00:00 via INTRAVENOUS

## 2014-02-10 MED ORDER — DEXTROSE 5 % IV SOLN
15.0000 mg/m2 | Freq: Once | INTRAVENOUS | Status: AC
  Administered 2014-02-10: 28 mg via INTRAVENOUS
  Filled 2014-02-10: qty 14

## 2014-02-10 MED ORDER — ONDANSETRON 8 MG/50ML IVPB (CHCC)
8.0000 mg | Freq: Once | INTRAVENOUS | Status: AC
  Administered 2014-02-10: 8 mg via INTRAVENOUS

## 2014-02-10 MED ORDER — ONDANSETRON 8 MG/NS 50 ML IVPB
INTRAVENOUS | Status: AC
  Filled 2014-02-10: qty 8

## 2014-02-10 MED ORDER — DEXAMETHASONE SODIUM PHOSPHATE 10 MG/ML IJ SOLN
10.0000 mg | Freq: Once | INTRAMUSCULAR | Status: AC
  Administered 2014-02-10: 10 mg via INTRAVENOUS

## 2014-02-10 NOTE — Patient Instructions (Signed)
[facility] Discharge Instructions for Patients Receiving Chemotherapy  Today you received the following chemotherapy agents :  Kyprolis.  To help prevent nausea and vomiting after your treatment, we encourage you to take your nausea medication as instructed by your physician.   If you develop nausea and vomiting that is not controlled by your nausea medication, call the clinic.   BELOW ARE SYMPTOMS THAT SHOULD BE REPORTED IMMEDIATELY:  *FEVER GREATER THAN 100.5 F  *CHILLS WITH OR WITHOUT FEVER  NAUSEA AND VOMITING THAT IS NOT CONTROLLED WITH YOUR NAUSEA MEDICATION  *UNUSUAL SHORTNESS OF BREATH  *UNUSUAL BRUISING OR BLEEDING  TENDERNESS IN MOUTH AND THROAT WITH OR WITHOUT PRESENCE OF ULCERS  *URINARY PROBLEMS  *BOWEL PROBLEMS  UNUSUAL RASH Items with * indicate a potential emergency and should be followed up as soon as possible.  Feel free to call the clinic you have any questions or concerns. The clinic phone number is [phone].    

## 2014-02-10 NOTE — Progress Notes (Signed)
Dr. Juliann Mule notified of all lab results today.  Proceed with chemo as planned per md.

## 2014-02-11 ENCOUNTER — Ambulatory Visit (HOSPITAL_BASED_OUTPATIENT_CLINIC_OR_DEPARTMENT_OTHER): Payer: Medicare Other

## 2014-02-11 VITALS — BP 158/76 | HR 72 | Temp 97.7°F | Resp 18

## 2014-02-11 DIAGNOSIS — C9000 Multiple myeloma not having achieved remission: Secondary | ICD-10-CM

## 2014-02-11 DIAGNOSIS — Z5112 Encounter for antineoplastic immunotherapy: Secondary | ICD-10-CM

## 2014-02-11 MED ORDER — ONDANSETRON 8 MG/NS 50 ML IVPB
INTRAVENOUS | Status: AC
  Filled 2014-02-11: qty 8

## 2014-02-11 MED ORDER — DEXAMETHASONE SODIUM PHOSPHATE 10 MG/ML IJ SOLN
10.0000 mg | Freq: Once | INTRAMUSCULAR | Status: AC
  Administered 2014-02-11: 10 mg via INTRAVENOUS

## 2014-02-11 MED ORDER — DEXAMETHASONE SODIUM PHOSPHATE 10 MG/ML IJ SOLN
INTRAMUSCULAR | Status: AC
  Filled 2014-02-11: qty 1

## 2014-02-11 MED ORDER — DEXTROSE 5 % IV SOLN
15.0000 mg/m2 | Freq: Once | INTRAVENOUS | Status: AC
  Administered 2014-02-11: 28 mg via INTRAVENOUS
  Filled 2014-02-11: qty 14

## 2014-02-11 MED ORDER — ONDANSETRON 8 MG/50ML IVPB (CHCC)
8.0000 mg | Freq: Once | INTRAVENOUS | Status: AC
  Administered 2014-02-11: 8 mg via INTRAVENOUS

## 2014-02-11 MED ORDER — SODIUM CHLORIDE 0.9 % IV SOLN
Freq: Once | INTRAVENOUS | Status: AC
  Administered 2014-02-11: 11:00:00 via INTRAVENOUS

## 2014-02-11 MED ORDER — SODIUM CHLORIDE 0.9 % IV SOLN: Freq: Once | INTRAVENOUS | Status: DC

## 2014-02-11 NOTE — Patient Instructions (Signed)
[facility] Discharge Instructions for Patients Receiving Chemotherapy  Today you received the following chemotherapy agents Kyprolis To help prevent nausea and vomiting after your treatment, we encourage you to take your nausea medication as prescribed.  If you develop nausea and vomiting that is not controlled by your nausea medication, call the clinic.   BELOW ARE SYMPTOMS THAT SHOULD BE REPORTED IMMEDIATELY:  *FEVER GREATER THAN 100.5 F  *CHILLS WITH OR WITHOUT FEVER  NAUSEA AND VOMITING THAT IS NOT CONTROLLED WITH YOUR NAUSEA MEDICATION  *UNUSUAL SHORTNESS OF BREATH  *UNUSUAL BRUISING OR BLEEDING  TENDERNESS IN MOUTH AND THROAT WITH OR WITHOUT PRESENCE OF ULCERS  *URINARY PROBLEMS  *BOWEL PROBLEMS  UNUSUAL RASH Items with * indicate a potential emergency and should be followed up as soon as possible.  Feel free to call the clinic you have any questions or concerns. The clinic phone number is [phone].    

## 2014-02-17 ENCOUNTER — Other Ambulatory Visit (HOSPITAL_BASED_OUTPATIENT_CLINIC_OR_DEPARTMENT_OTHER): Payer: BLUE CROSS/BLUE SHIELD

## 2014-02-17 ENCOUNTER — Ambulatory Visit (HOSPITAL_BASED_OUTPATIENT_CLINIC_OR_DEPARTMENT_OTHER): Payer: Medicare Other

## 2014-02-17 ENCOUNTER — Other Ambulatory Visit: Payer: Self-pay | Admitting: Internal Medicine

## 2014-02-17 VITALS — BP 163/78 | HR 60 | Temp 98.8°F | Resp 16

## 2014-02-17 DIAGNOSIS — C9000 Multiple myeloma not having achieved remission: Secondary | ICD-10-CM

## 2014-02-17 DIAGNOSIS — Z5112 Encounter for antineoplastic immunotherapy: Secondary | ICD-10-CM

## 2014-02-17 DIAGNOSIS — D61810 Antineoplastic chemotherapy induced pancytopenia: Secondary | ICD-10-CM

## 2014-02-17 LAB — BASIC METABOLIC PANEL (CC13)
ANION GAP: 9 meq/L (ref 3–11)
BUN: 18 mg/dL (ref 7.0–26.0)
CALCIUM: 8.7 mg/dL (ref 8.4–10.4)
CO2: 28 mEq/L (ref 22–29)
Chloride: 105 mEq/L (ref 98–109)
Creatinine: 1.1 mg/dL (ref 0.7–1.3)
Glucose: 92 mg/dl (ref 70–140)
POTASSIUM: 3.5 meq/L (ref 3.5–5.1)
SODIUM: 142 meq/L (ref 136–145)

## 2014-02-17 LAB — CBC WITH DIFFERENTIAL/PLATELET
BASO%: 0.7 % (ref 0.0–2.0)
BASOS ABS: 0 10*3/uL (ref 0.0–0.1)
EOS ABS: 0.2 10*3/uL (ref 0.0–0.5)
EOS%: 7.6 % — AB (ref 0.0–7.0)
HEMATOCRIT: 39.4 % (ref 38.4–49.9)
HEMOGLOBIN: 13 g/dL (ref 13.0–17.1)
LYMPH#: 0.6 10*3/uL — AB (ref 0.9–3.3)
LYMPH%: 25.2 % (ref 14.0–49.0)
MCH: 32.3 pg (ref 27.2–33.4)
MCHC: 33 g/dL (ref 32.0–36.0)
MCV: 97.7 fL (ref 79.3–98.0)
MONO#: 0.7 10*3/uL (ref 0.1–0.9)
MONO%: 29.5 % — ABNORMAL HIGH (ref 0.0–14.0)
NEUT#: 0.8 10*3/uL — ABNORMAL LOW (ref 1.5–6.5)
NEUT%: 37 % — AB (ref 39.0–75.0)
Platelets: 129 10*3/uL — ABNORMAL LOW (ref 140–400)
RBC: 4.03 10*6/uL — ABNORMAL LOW (ref 4.20–5.82)
RDW: 14.8 % — ABNORMAL HIGH (ref 11.0–14.6)
WBC: 2.3 10*3/uL — ABNORMAL LOW (ref 4.0–10.3)

## 2014-02-17 MED ORDER — DEXAMETHASONE SODIUM PHOSPHATE 10 MG/ML IJ SOLN
INTRAMUSCULAR | Status: AC
  Filled 2014-02-17: qty 1

## 2014-02-17 MED ORDER — DEXTROSE 5 % IV SOLN
15.0000 mg/m2 | Freq: Once | INTRAVENOUS | Status: AC
  Administered 2014-02-17: 28 mg via INTRAVENOUS
  Filled 2014-02-17: qty 14

## 2014-02-17 MED ORDER — TBO-FILGRASTIM 480 MCG/0.8ML ~~LOC~~ SOSY
480.0000 ug | PREFILLED_SYRINGE | Freq: Once | SUBCUTANEOUS | Status: DC
  Filled 2014-02-17: qty 0.8

## 2014-02-17 MED ORDER — DEXAMETHASONE SODIUM PHOSPHATE 10 MG/ML IJ SOLN
10.0000 mg | Freq: Once | INTRAMUSCULAR | Status: AC
  Administered 2014-02-17: 10 mg via INTRAVENOUS

## 2014-02-17 MED ORDER — SODIUM CHLORIDE 0.9 % IV SOLN: Freq: Once | INTRAVENOUS | Status: DC

## 2014-02-17 MED ORDER — ONDANSETRON 8 MG/NS 50 ML IVPB
INTRAVENOUS | Status: AC
  Filled 2014-02-17: qty 8

## 2014-02-17 MED ORDER — ZOLEDRONIC ACID 4 MG/5ML IV CONC
3.5000 mg | Freq: Once | INTRAVENOUS | Status: AC
  Administered 2014-02-17: 3.5 mg via INTRAVENOUS
  Filled 2014-02-17: qty 4.38

## 2014-02-17 MED ORDER — ONDANSETRON 8 MG/50ML IVPB (CHCC)
8.0000 mg | Freq: Once | INTRAVENOUS | Status: AC
  Administered 2014-02-17: 8 mg via INTRAVENOUS

## 2014-02-17 MED ORDER — SODIUM CHLORIDE 0.9 % IV SOLN
Freq: Once | INTRAVENOUS | Status: AC
  Administered 2014-02-17: 13:00:00 via INTRAVENOUS

## 2014-02-17 NOTE — Progress Notes (Signed)
Dr. Juliann Mule saw pt in infusion room prior to chemo today.  MD also reviewed all lab results.  Proceed with chemo as per md.  Pt to receive Neupogen injection on 02/20/14.   Pt stated he was instructed by md to [place] until next lab rechecked on 02/24/14.

## 2014-02-17 NOTE — Patient Instructions (Signed)
[facility] Discharge Instructions for Patients Receiving Chemotherapy  Today you received the following chemotherapy agents Kyprolis.  To help prevent nausea and vomiting after your treatment, we encourage you to take your nausea medication.   If you develop nausea and vomiting that is not controlled by your nausea medication, call the clinic.   BELOW ARE SYMPTOMS THAT SHOULD BE REPORTED IMMEDIATELY:  *FEVER GREATER THAN 100.5 F  *CHILLS WITH OR WITHOUT FEVER  NAUSEA AND VOMITING THAT IS NOT CONTROLLED WITH YOUR NAUSEA MEDICATION  *UNUSUAL SHORTNESS OF BREATH  *UNUSUAL BRUISING OR BLEEDING  TENDERNESS IN MOUTH AND THROAT WITH OR WITHOUT PRESENCE OF ULCERS  *URINARY PROBLEMS  *BOWEL PROBLEMS  UNUSUAL RASH Items with * indicate a potential emergency and should be followed up as soon as possible.  Feel free to call the clinic you have any questions or concerns. The clinic phone number is [phone].    

## 2014-02-18 ENCOUNTER — Other Ambulatory Visit: Payer: Self-pay | Admitting: Internal Medicine

## 2014-02-18 ENCOUNTER — Ambulatory Visit (HOSPITAL_BASED_OUTPATIENT_CLINIC_OR_DEPARTMENT_OTHER): Payer: Medicare Other

## 2014-02-18 VITALS — BP 166/81 | HR 78 | Temp 97.5°F | Resp 18

## 2014-02-18 DIAGNOSIS — T451X5A Adverse effect of antineoplastic and immunosuppressive drugs, initial encounter: Principal | ICD-10-CM

## 2014-02-18 DIAGNOSIS — D701 Agranulocytosis secondary to cancer chemotherapy: Secondary | ICD-10-CM

## 2014-02-18 DIAGNOSIS — Z5112 Encounter for antineoplastic immunotherapy: Secondary | ICD-10-CM

## 2014-02-18 DIAGNOSIS — C9000 Multiple myeloma not having achieved remission: Secondary | ICD-10-CM

## 2014-02-18 MED ORDER — DEXAMETHASONE SODIUM PHOSPHATE 10 MG/ML IJ SOLN
10.0000 mg | Freq: Once | INTRAMUSCULAR | Status: AC
  Administered 2014-02-18: 10 mg via INTRAVENOUS

## 2014-02-18 MED ORDER — ONDANSETRON 8 MG/NS 50 ML IVPB
INTRAVENOUS | Status: AC
  Filled 2014-02-18: qty 8

## 2014-02-18 MED ORDER — SODIUM CHLORIDE 0.9 % IV SOLN
Freq: Once | INTRAVENOUS | Status: AC
  Administered 2014-02-18: 11:00:00 via INTRAVENOUS

## 2014-02-18 MED ORDER — DEXAMETHASONE SODIUM PHOSPHATE 10 MG/ML IJ SOLN
INTRAMUSCULAR | Status: AC
  Filled 2014-02-18: qty 1

## 2014-02-18 MED ORDER — SODIUM CHLORIDE 0.9 % IV SOLN: Freq: Once | INTRAVENOUS | Status: DC

## 2014-02-18 MED ORDER — CARFILZOMIB CHEMO INJECTION 60 MG
15.0000 mg/m2 | Freq: Once | INTRAVENOUS | Status: AC
  Administered 2014-02-18: 28 mg via INTRAVENOUS
  Filled 2014-02-18: qty 14

## 2014-02-18 MED ORDER — TBO-FILGRASTIM 480 MCG/0.8ML ~~LOC~~ SOSY: 480.0000 ug | PREFILLED_SYRINGE | Freq: Once | SUBCUTANEOUS | Status: DC

## 2014-02-18 MED ORDER — ONDANSETRON 8 MG/50ML IVPB (CHCC)
8.0000 mg | Freq: Once | INTRAVENOUS | Status: AC
  Administered 2014-02-18: 8 mg via INTRAVENOUS

## 2014-02-18 NOTE — Addendum Note (Signed)
Addended by: Tora Kindred on: 02/18/2014 03:19 PM   Modules accepted: Orders

## 2014-02-18 NOTE — Patient Instructions (Signed)
[facility] Discharge Instructions for Patients Receiving Chemotherapy  Today you received the following chemotherapy agents Kyprolis.  To help prevent nausea and vomiting after your treatment, we encourage you to take your nausea medication.   If you develop nausea and vomiting that is not controlled by your nausea medication, call the clinic.   BELOW ARE SYMPTOMS THAT SHOULD BE REPORTED IMMEDIATELY:  *FEVER GREATER THAN 100.5 F  *CHILLS WITH OR WITHOUT FEVER  NAUSEA AND VOMITING THAT IS NOT CONTROLLED WITH YOUR NAUSEA MEDICATION  *UNUSUAL SHORTNESS OF BREATH  *UNUSUAL BRUISING OR BLEEDING  TENDERNESS IN MOUTH AND THROAT WITH OR WITHOUT PRESENCE OF ULCERS  *URINARY PROBLEMS  *BOWEL PROBLEMS  UNUSUAL RASH Items with * indicate a potential emergency and should be followed up as soon as possible.  Feel free to call the clinic you have any questions or concerns. The clinic phone number is [phone].    

## 2014-02-20 ENCOUNTER — Ambulatory Visit (HOSPITAL_BASED_OUTPATIENT_CLINIC_OR_DEPARTMENT_OTHER): Payer: Medicare Other

## 2014-02-20 VITALS — BP 152/82 | HR 58 | Temp 97.5°F

## 2014-02-20 DIAGNOSIS — D701 Agranulocytosis secondary to cancer chemotherapy: Secondary | ICD-10-CM

## 2014-02-20 DIAGNOSIS — T451X5A Adverse effect of antineoplastic and immunosuppressive drugs, initial encounter: Principal | ICD-10-CM

## 2014-02-20 DIAGNOSIS — Z5189 Encounter for other specified aftercare: Secondary | ICD-10-CM

## 2014-02-20 DIAGNOSIS — C9000 Multiple myeloma not having achieved remission: Secondary | ICD-10-CM

## 2014-02-20 MED ORDER — TBO-FILGRASTIM 480 MCG/0.8ML ~~LOC~~ SOSY
480.0000 ug | PREFILLED_SYRINGE | Freq: Once | SUBCUTANEOUS | Status: AC
  Administered 2014-02-20: 480 ug via SUBCUTANEOUS

## 2014-02-20 NOTE — Patient Instructions (Signed)
Tbo-Filgrastim injection (Granix) What is this medicine? TBO-FILGRASTIM is used to help decrease the time you have low amounts of white blood cells after cancer treatment. It helps the body make more white blood cells. Increasing the amount of white blood cells helps to decrease the risk of infection and fever. This medicine may be used for other purposes; ask your health care provider or pharmacist if you have questions. COMMON BRAND NAME(S): Granix What should I tell my health care provider before I take this medicine? They need to know if you have any of these conditions: -history of blood diseases, like sickle cell anemia or leukemia -an unusual or allergic reaction to tbo-filgrastim, filgrastim, pegfilgrastim, other medicines, foods, dyes, or preservatives -pregnant or trying to get pregnant -breast-feeding How should I use this medicine? This medicine is for injection under the skin. It is given by a health care professional in a hospital or clinic setting. Talk to your pediatrician regarding the use of this medicine in children. Special care may be needed. Overdosage: If you think you've taken too much of this medicine contact a poison control center or emergency room at once. Overdosage: If you think you have taken too much of this medicine contact a poison control center or emergency room at once. NOTE: This medicine is only for you. Do not share this medicine with others. What if I miss a dose? Keep appointments for follow-up doses as directed. It is important not to miss your dose. Call your doctor or health care professional if you are unable to keep an appointment. What may interact with this medicine? -lithium This list may not describe all possible interactions. Give your health care provider a list of all the medicines, herbs, non-prescription drugs, or dietary supplements you use. Also tell them if you smoke, drink alcohol, or use illegal drugs. Some items may interact with your  medicine. What should I watch for while using this medicine? Your condition will be monitored carefully while you are receiving this medicine. You may need blood work done while you are taking this medicine. Avoid taking products that contain aspirin, acetaminophen, ibuprofen, naproxen, or ketoprofen unless instructed by your doctor. These medicines may hide a fever. Call your doctor or health care professional for advice if you get a fever, chills or sore throat, or other symptoms of a cold or flu. Do not treat yourself. What side effects may I notice from receiving this medicine? Side effects that you should report to your doctor or health care professional as soon as possible: -allergic reactions like skin rash, itching or hives, swelling of the face, lips, or tongue -breathing problems -fever -stomach pain Side effects that usually do not require medical attention (Report these to your doctor or health care professional if they continue or are bothersome.): -bone pain This list may not describe all possible side effects. Call your doctor for medical advice about side effects. You may report side effects to FDA at 1-800-FDA-1088. Where should I keep my medicine? This drug is given in a hospital or clinic and will not be stored at home. NOTE: This sheet is a summary. It may not cover all possible information. If you have questions about this medicine, talk to your doctor, pharmacist, or health care provider.  2015, [employer] Standard. (2011-04-25 16:41:24)

## 2014-02-22 ENCOUNTER — Ambulatory Visit: Payer: Medicare Other

## 2014-02-24 ENCOUNTER — Other Ambulatory Visit (HOSPITAL_BASED_OUTPATIENT_CLINIC_OR_DEPARTMENT_OTHER): Payer: Medicare Other

## 2014-02-24 ENCOUNTER — Ambulatory Visit (HOSPITAL_BASED_OUTPATIENT_CLINIC_OR_DEPARTMENT_OTHER): Payer: Medicare Other

## 2014-02-24 ENCOUNTER — Other Ambulatory Visit: Payer: Self-pay | Admitting: Internal Medicine

## 2014-02-24 VITALS — BP 175/78 | HR 61 | Temp 97.1°F | Resp 20

## 2014-02-24 DIAGNOSIS — C9000 Multiple myeloma not having achieved remission: Secondary | ICD-10-CM

## 2014-02-24 DIAGNOSIS — Z5112 Encounter for antineoplastic immunotherapy: Secondary | ICD-10-CM

## 2014-02-24 LAB — BASIC METABOLIC PANEL (CC13)
Anion Gap: 8 mEq/L (ref 3–11)
BUN: 21.7 mg/dL (ref 7.0–26.0)
CHLORIDE: 106 meq/L (ref 98–109)
CO2: 28 mEq/L (ref 22–29)
Calcium: 9.1 mg/dL (ref 8.4–10.4)
Creatinine: 0.9 mg/dL (ref 0.7–1.3)
GLUCOSE: 97 mg/dL (ref 70–140)
POTASSIUM: 3.6 meq/L (ref 3.5–5.1)
SODIUM: 142 meq/L (ref 136–145)

## 2014-02-24 LAB — CBC WITH DIFFERENTIAL/PLATELET
BASO%: 0.8 % (ref 0.0–2.0)
Basophils Absolute: 0 10*3/uL (ref 0.0–0.1)
EOS%: 2.4 % (ref 0.0–7.0)
Eosinophils Absolute: 0.1 10*3/uL (ref 0.0–0.5)
HCT: 41.3 % (ref 38.4–49.9)
HGB: 13.8 g/dL (ref 13.0–17.1)
LYMPH#: 0.9 10*3/uL (ref 0.9–3.3)
LYMPH%: 18.6 % (ref 14.0–49.0)
MCH: 32.8 pg (ref 27.2–33.4)
MCHC: 33.3 g/dL (ref 32.0–36.0)
MCV: 98.3 fL — ABNORMAL HIGH (ref 79.3–98.0)
MONO#: 1.4 10*3/uL — ABNORMAL HIGH (ref 0.1–0.9)
MONO%: 30.4 % — ABNORMAL HIGH (ref 0.0–14.0)
NEUT#: 2.2 10*3/uL (ref 1.5–6.5)
NEUT%: 47.8 % (ref 39.0–75.0)
Platelets: 185 10*3/uL (ref 140–400)
RBC: 4.2 10*6/uL (ref 4.20–5.82)
RDW: 15 % — ABNORMAL HIGH (ref 11.0–14.6)
WBC: 4.6 10*3/uL (ref 4.0–10.3)

## 2014-02-24 MED ORDER — SODIUM CHLORIDE 0.9 % IV SOLN
Freq: Once | INTRAVENOUS | Status: AC
  Administered 2014-02-24: 12:00:00 via INTRAVENOUS

## 2014-02-24 MED ORDER — DEXTROSE 5 % IV SOLN
15.0000 mg/m2 | Freq: Once | INTRAVENOUS | Status: AC
  Administered 2014-02-24: 28 mg via INTRAVENOUS
  Filled 2014-02-24: qty 14

## 2014-02-24 MED ORDER — ONDANSETRON 8 MG/50ML IVPB (CHCC)
8.0000 mg | Freq: Once | INTRAVENOUS | Status: AC
  Administered 2014-02-24: 8 mg via INTRAVENOUS

## 2014-02-24 MED ORDER — ONDANSETRON 8 MG/NS 50 ML IVPB
INTRAVENOUS | Status: AC
  Filled 2014-02-24: qty 8

## 2014-02-24 MED ORDER — DEXAMETHASONE SODIUM PHOSPHATE 10 MG/ML IJ SOLN
10.0000 mg | Freq: Once | INTRAMUSCULAR | Status: AC
  Administered 2014-02-24: 10 mg via INTRAVENOUS

## 2014-02-24 MED ORDER — DEXAMETHASONE SODIUM PHOSPHATE 10 MG/ML IJ SOLN
INTRAMUSCULAR | Status: AC
  Filled 2014-02-24: qty 1

## 2014-02-24 NOTE — Patient Instructions (Signed)
[facility] Discharge Instructions for Patients Receiving Chemotherapy  Today you received the following chemotherapy agents Kyprolis.  To help prevent nausea and vomiting after your treatment, we encourage you to take your nausea medication.   If you develop nausea and vomiting that is not controlled by your nausea medication, call the clinic.   BELOW ARE SYMPTOMS THAT SHOULD BE REPORTED IMMEDIATELY:  *FEVER GREATER THAN 100.5 F  *CHILLS WITH OR WITHOUT FEVER  NAUSEA AND VOMITING THAT IS NOT CONTROLLED WITH YOUR NAUSEA MEDICATION  *UNUSUAL SHORTNESS OF BREATH  *UNUSUAL BRUISING OR BLEEDING  TENDERNESS IN MOUTH AND THROAT WITH OR WITHOUT PRESENCE OF ULCERS  *URINARY PROBLEMS  *BOWEL PROBLEMS  UNUSUAL RASH Items with * indicate a potential emergency and should be followed up as soon as possible.  Feel free to call the clinic you have any questions or concerns. The clinic phone number is [phone].    

## 2014-02-25 ENCOUNTER — Telehealth: Payer: Self-pay | Admitting: Medical Oncology

## 2014-02-25 ENCOUNTER — Ambulatory Visit (HOSPITAL_BASED_OUTPATIENT_CLINIC_OR_DEPARTMENT_OTHER): Payer: Medicare Other

## 2014-02-25 VITALS — BP 152/74 | HR 63 | Temp 98.4°F | Resp 20

## 2014-02-25 DIAGNOSIS — Z5112 Encounter for antineoplastic immunotherapy: Secondary | ICD-10-CM

## 2014-02-25 DIAGNOSIS — C9000 Multiple myeloma not having achieved remission: Secondary | ICD-10-CM

## 2014-02-25 MED ORDER — ONDANSETRON 8 MG/NS 50 ML IVPB
INTRAVENOUS | Status: AC
  Filled 2014-02-25: qty 8

## 2014-02-25 MED ORDER — DEXAMETHASONE SODIUM PHOSPHATE 10 MG/ML IJ SOLN
10.0000 mg | Freq: Once | INTRAMUSCULAR | Status: AC
  Administered 2014-02-25: 10 mg via INTRAVENOUS

## 2014-02-25 MED ORDER — SODIUM CHLORIDE 0.9 % IV SOLN
Freq: Once | INTRAVENOUS | Status: AC
  Administered 2014-02-25: 14:00:00 via INTRAVENOUS

## 2014-02-25 MED ORDER — DEXAMETHASONE SODIUM PHOSPHATE 10 MG/ML IJ SOLN
INTRAMUSCULAR | Status: AC
  Filled 2014-02-25: qty 1

## 2014-02-25 MED ORDER — ONDANSETRON 8 MG/50ML IVPB (CHCC)
8.0000 mg | Freq: Once | INTRAVENOUS | Status: AC
  Administered 2014-02-25: 8 mg via INTRAVENOUS

## 2014-02-25 MED ORDER — DEXTROSE 5 % IV SOLN
15.0000 mg/m2 | Freq: Once | INTRAVENOUS | Status: AC
  Administered 2014-02-25: 28 mg via INTRAVENOUS
  Filled 2014-02-25: qty 14

## 2014-02-25 NOTE — Telephone Encounter (Signed)
Pt called stating that Dr. Juliann Mule put his revlimid on hold last week due to low WBC. He was here yesterday for labs and his counts have recovered. He would like to know if it is ok to restart the revlimid. Per Dr. Juliann Mule it is ok to restart. Pt voiced understanding.

## 2014-02-25 NOTE — Patient Instructions (Signed)
[facility] Discharge Instructions for Patients Receiving Chemotherapy  Today you received the following chemotherapy agents: Kyprolis  To help prevent nausea and vomiting after your treatment, we encourage you to take your nausea medication as prescribed by your physician.   If you develop nausea and vomiting that is not controlled by your nausea medication, call the clinic.   BELOW ARE SYMPTOMS THAT SHOULD BE REPORTED IMMEDIATELY:  *FEVER GREATER THAN 100.5 F  *CHILLS WITH OR WITHOUT FEVER  NAUSEA AND VOMITING THAT IS NOT CONTROLLED WITH YOUR NAUSEA MEDICATION  *UNUSUAL SHORTNESS OF BREATH  *UNUSUAL BRUISING OR BLEEDING  TENDERNESS IN MOUTH AND THROAT WITH OR WITHOUT PRESENCE OF ULCERS  *URINARY PROBLEMS  *BOWEL PROBLEMS  UNUSUAL RASH Items with * indicate a potential emergency and should be followed up as soon as possible.  Feel free to call the clinic you have any questions or concerns. The clinic phone number is [phone].    

## 2014-02-26 LAB — SPEP & IFE WITH QIG
ALBUMIN ELP: 64.6 % (ref 55.8–66.1)
Alpha-1-Globulin: 7.4 % — ABNORMAL HIGH (ref 2.9–4.9)
Alpha-2-Globulin: 11.4 % (ref 7.1–11.8)
Beta 2: 3 % — ABNORMAL LOW (ref 3.2–6.5)
Beta Globulin: 6.8 % (ref 4.7–7.2)
Gamma Globulin: 6.8 % — ABNORMAL LOW (ref 11.1–18.8)
IgA: 48 mg/dL — ABNORMAL LOW (ref 68–379)
IgG (Immunoglobin G), Serum: 439 mg/dL — ABNORMAL LOW (ref 650–1600)
IgM, Serum: 12 mg/dL — ABNORMAL LOW (ref 41–251)
Total Protein, Serum Electrophoresis: 5.8 g/dL — ABNORMAL LOW (ref 6.0–8.3)

## 2014-02-26 LAB — KAPPA/LAMBDA LIGHT CHAINS
KAPPA FREE LGHT CHN: 0.15 mg/dL — AB (ref 0.33–1.94)
Kappa:Lambda Ratio: 0.01 — ABNORMAL LOW (ref 0.26–1.65)
Lambda Free Lght Chn: 13 mg/dL — ABNORMAL HIGH (ref 0.57–2.63)

## 2014-03-01 ENCOUNTER — Other Ambulatory Visit: Payer: Self-pay | Admitting: *Deleted

## 2014-03-01 DIAGNOSIS — C9000 Multiple myeloma not having achieved remission: Secondary | ICD-10-CM

## 2014-03-01 MED ORDER — ACYCLOVIR 400 MG PO TABS: 400.0000 mg | ORAL_TABLET | Freq: Two times a day (BID) | ORAL | Status: DC

## 2014-03-02 ENCOUNTER — Other Ambulatory Visit: Payer: Medicare Other

## 2014-03-02 ENCOUNTER — Telehealth: Payer: Self-pay | Admitting: Internal Medicine

## 2014-03-02 ENCOUNTER — Other Ambulatory Visit (HOSPITAL_BASED_OUTPATIENT_CLINIC_OR_DEPARTMENT_OTHER): Payer: Medicare Other

## 2014-03-02 ENCOUNTER — Ambulatory Visit: Payer: Medicare Other

## 2014-03-02 ENCOUNTER — Telehealth: Payer: Self-pay | Admitting: *Deleted

## 2014-03-02 ENCOUNTER — Ambulatory Visit (HOSPITAL_BASED_OUTPATIENT_CLINIC_OR_DEPARTMENT_OTHER): Payer: Medicare Other | Admitting: Internal Medicine

## 2014-03-02 ENCOUNTER — Encounter: Payer: Self-pay | Admitting: Internal Medicine

## 2014-03-02 VITALS — BP 166/78 | HR 69 | Temp 97.8°F | Resp 20 | Ht 69.0 in | Wt 161.7 lb

## 2014-03-02 DIAGNOSIS — C9000 Multiple myeloma not having achieved remission: Secondary | ICD-10-CM

## 2014-03-02 DIAGNOSIS — D72819 Decreased white blood cell count, unspecified: Secondary | ICD-10-CM

## 2014-03-02 LAB — CBC WITH DIFFERENTIAL/PLATELET
BASO%: 0.4 % (ref 0.0–2.0)
Basophils Absolute: 0 10*3/uL (ref 0.0–0.1)
EOS%: 6.7 % (ref 0.0–7.0)
Eosinophils Absolute: 0.2 10*3/uL (ref 0.0–0.5)
HCT: 39.1 % (ref 38.4–49.9)
HGB: 13.3 g/dL (ref 13.0–17.1)
LYMPH%: 17.3 % (ref 14.0–49.0)
MCH: 32.8 pg (ref 27.2–33.4)
MCHC: 34 g/dL (ref 32.0–36.0)
MCV: 96.5 fL (ref 79.3–98.0)
MONO#: 0.4 10*3/uL (ref 0.1–0.9)
MONO%: 13.7 % (ref 0.0–14.0)
NEUT#: 1.8 10*3/uL (ref 1.5–6.5)
NEUT%: 61.9 % (ref 39.0–75.0)
Platelets: 135 10*3/uL — ABNORMAL LOW (ref 140–400)
RBC: 4.05 10*6/uL — AB (ref 4.20–5.82)
RDW: 14.4 % (ref 11.0–14.6)
WBC: 2.8 10*3/uL — AB (ref 4.0–10.3)
lymph#: 0.5 10*3/uL — ABNORMAL LOW (ref 0.9–3.3)

## 2014-03-02 LAB — COMPREHENSIVE METABOLIC PANEL (CC13)
ALBUMIN: 3.7 g/dL (ref 3.5–5.0)
ALT: 18 U/L (ref 0–55)
AST: 15 U/L (ref 5–34)
Alkaline Phosphatase: 68 U/L (ref 40–150)
Anion Gap: 10 mEq/L (ref 3–11)
BUN: 19.6 mg/dL (ref 7.0–26.0)
CALCIUM: 8.9 mg/dL (ref 8.4–10.4)
CO2: 32 mEq/L — ABNORMAL HIGH (ref 22–29)
Chloride: 102 mEq/L (ref 98–109)
Creatinine: 1 mg/dL (ref 0.7–1.3)
Glucose: 72 mg/dl (ref 70–140)
POTASSIUM: 3.7 meq/L (ref 3.5–5.1)
SODIUM: 143 meq/L (ref 136–145)
TOTAL PROTEIN: 6 g/dL — AB (ref 6.4–8.3)
Total Bilirubin: 0.95 mg/dL (ref 0.20–1.20)

## 2014-03-02 LAB — LACTATE DEHYDROGENASE (CC13): LDH: 206 U/L (ref 125–245)

## 2014-03-02 MED ORDER — ACYCLOVIR 400 MG PO TABS: 400.0000 mg | ORAL_TABLET | Freq: Two times a day (BID) | ORAL | Status: DC

## 2014-03-02 MED ORDER — DEXAMETHASONE 4 MG PO TABS: ORAL_TABLET | ORAL | Status: DC

## 2014-03-02 MED ORDER — LENALIDOMIDE 5 MG PO CAPS: ORAL_CAPSULE | ORAL | Status: DC

## 2014-03-02 NOTE — Progress Notes (Signed)
[place] OFFICE PROGRESS NOTE  Adella Hare, MD No address on file  DIAGNOSIS: Multiple myeloma - Plan: lenalidomide (REVLIMID) 5 MG capsule, CBC with Differential, Comprehensive metabolic panel (Cmet) - [facility], Lactate dehydrogenase (LDH) - [facility], SPEP & IFE with QIG, Kappa/lambda light chains, CBC with Differential, Basic metabolic panel (Bmet) - [facility], CBC with Differential, Basic metabolic panel (Bmet) - [facility], CBC with Differential, Basic metabolic panel (Bmet) - [facility]  Multiple myeloma, without mention of having achieved remission - Plan: acyclovir (ZOVIRAX) 400 MG tablet  Chief Complaint  Patient presents with  . Multiple Myeloma    CURRENT THERAPY:   Aggressive salvage therapy with carfilzomib/revlimid/dexa started on 10/28/2013 as detailed below.     Multiple myeloma   05/21/2012 Initial Diagnosis Multiple myeloma, ISS stage III.   06/19/2012 Imaging Lytic lesions in the calvaria and right hemipelvis and a possible lesion in the right humerus.    06/20/2012 Imaging CT scan of the right humerus showed a 2.3 cm lucent lesion with probable mild endosteal thinning anteriorly near the bicipital groove.  several small lytic lesions in humeral head, scapula, glenoid, and proximal radius.     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 Plasma cells are lambda ligh chain restricted.    06/24/2012 Pathology Cytogenetics revealed the presence of normal male chormosomes with no observable clonal abnormalities.  Single cell with additional chromosome material on 3q, loss of 13 .  DNA, gain of chromosome 11.    07/01/2012 - 01/02/2013 Chemotherapy Velcade, cytoxan and decadron. doses were increased on 11/06/2012.  Neulasta was started on 08/16/2012 because of leukopenia.    07/10/2012 - 07/28/2012 Radiation Therapy He received XRT to the right humerus 25 Gy in 10 fractions.    12/04/2012  Bone Marrow Biopsy Hypercllular for age with trilineage hematopoiesis in addition to increased number of atypical plasma cells estimated at 24% of all cells in the aspirate. Lamda light chain restriction. rare circulating plasma cells.   12/04/2012 Pathology Cytogenetics revealed the presence of 2 clonal cell lines. first line with chromosal normal (25%).  2nd line was abnormal, missing a Y chromosome. 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 FISH showed gain of 11.    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  S/p melphalan 140 mg /m2 on 01/27/2013.    06/27/2013 Tumor Marker 24-hour urine yielded 17.1 gram of protein of which 16.9 grams was free lambda light chains.  Beta 2 microglobulin was 5.8. Albumin 3.2. IgG 6,320.    07/07/2013 - 09/01/2013 Chemotherapy Maintenance velcade started.   09/21/2013 Progression Counts decreasing; WBC followed by Plts. Referred to [facility] for recommendations for next therapy.    10/12/2013 Bone Marrow Biopsy Done at [facility].  Per care everywhere, PLASMA CELLS COMPRISE 80% OF A PACKED BONE MARROW.    10/22/2013 Tumor Marker Labs done at [facility].  spep m-spike 0.08, FLC lambda 379.00, ratio 0. 24 hour urine, m-spike 16632, IFE positive for monoclonal lambda.   10/27/2013 -  Chemotherapy Started salvage chemotherapy with carlfozomib plus dexamethasone plus revlimid.    11/09/2013 - 11/17/2013 Hospital Admission Admitted with fever.  Started on broad spectrum antibiotics. Had severe anemia and was give hemoglobin with questionable tranfusion reaction. 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 Discharged off antibiotics.    01/21/2014 -  Chemotherapy Zometa 3.5 mg monthly started.    03/02/2014 Treatment Plan Change Decreased Revlimide to  5 mg (down from 10 mg) daily for 21 days on and 7 days off every 28 days due persistent leukopenia.      INTERVAL HISTORY: Trevor Michael 73 y.o. male with a history of Stage III  IgA lambda multiple myeloma s/p SCT (01/28/2013) here for follow-up.  He was last seen by me on 02/02/2014.  As noted above he started salvage chemotherapy.  Today, he is accompanied by his sister and brother-in-law.  He reports improvement in his energy overall. His weight is stable.  He denies bleeding and night sweats.  He saw  his opthalmalogist last month with recommendations to continue his eye drops daily. He tolerated cycle #4 of his combination chemotherapy without difficulty and no hospitalizations or emergency room visits. He did however, have to have one week off revlimid due to leukopenia.  He tolerated zometa on 02/17/2014.    MEDICAL HISTORY: Past Medical History  Diagnosis Date  . Other and unspecified hyperlipidemia   . Essential hypertension, benign   . BPH (benign prostatic hyperplasia)   . Early stage glaucoma   . Heart murmur     "since my teens" (06/18/2012)  . Cervical stenosis of spine   . GERD (gastroesophageal reflux disease)   . Bone cancer     multiple bone lesion/right humerus  . Spinal stenosis   . Chronic kidney disease     renal insufficiency  . Arthritis   . DDD (degenerative disc disease)     c-5=-c6, and spondylosis  . Cancer 05/2012    IgA lambda multiple myeloma  . Anorexia 06/25/12    1 month hx   . Diverticulitis   . History of chemotherapy 07/01/12    Velcade,Cytoxin and decadron    INTERIM HISTORY: has HYPERLIPIDEMIA, MILD; HYPERTENSION, MILD; BENIGN PROSTATIC HYPERTROPHY; Routine health maintenance; Rhinitis, non-allergic; Multiple myeloma; Bone cancer; Fever; Syncope; Dehydration; Acute renal failure; Acute hemolysis-non immune; Nausea & vomiting; Pancytopenia due to chemotherapy; Pulmonary edema; and Elevated troponin on his problem list.    ALLERGIES:  has No Known Allergies.  MEDICATIONS: has a current medication list which includes the following prescription(s): acetaminophen, acyclovir, allopurinol, aspirin ec, atenolol-chlorthalidone,  bimatoprost, brimonidine-timolol, dexamethasone, esomeprazole, feeding supplement (resource breeze), folic acid, loperamide, loratadine, potassium chloride sa, prochlorperazine, senna, simvastatin, sodium bicarbonate, tamsulosin, and lenalidomide.  SURGICAL HISTORY:  Past Surgical History  Procedure Laterality Date  . Lipoma excision  07/1986    left shoulder/scapula  . Esophagogastroduodenoscopy  06/20/2012    Procedure: ESOPHAGOGASTRODUODENOSCOPY (EGD);  Surgeon: Beryle Beams, MD;  Location: [facility] ENDOSCOPY;  Service: Endoscopy;  Laterality: N/A;  . Prostate biopsy  2008    benign   PROBLEM LIST:  1. Renal insufficiency. Creatinine clearance on 06/27/2012 was 68 mL/min.  2. A 2.3 cm lytic lesion in the right humerus at risk for pathologic fracture. Radiation treatments were administered to the right humerus 25 Gy in 10 fractions from 07/10/2012 through 07/28/2012.  3. Hypertension.  4. Benign prostate hypertrophy and elevated prostate specific antigen, under the care of Dr. Rana Snare. The patient had a prostate biopsy in 2008 that was benign.  5. Dyslipidemia.  6. Arthritis involving the cervical spine  7. Glaucoma.   REVIEW OF SYSTEMS:   Constitutional: Denies fevers, chills but reports some weight loss since recent hospitalization but it is leveling off Eyes: Denies blurriness of vision Ears, nose, mouth, throat, and face: Denies mucositis or sore throat Respiratory: Denies cough, dyspnea or wheezes Cardiovascular: Denies palpitation, chest discomfort or lower extremity swelling Gastrointestinal:  Denies nausea, heartburn or change in bowel habits Skin: Denies abnormal skin rashes Lymphatics: Denies new lymphadenopathy or easy bruising Neurological:Denies numbness, tingling or new weaknesses Behavioral/Psych: Mood is stable, no new changes  All other systems were reviewed with the patient and are negative.  PHYSICAL EXAMINATION: ECOG PERFORMANCE STATUS: 0 -  Asymptomatic  Blood pressure 166/78, pulse 69, temperature 97.8 F (36.6 C), temperature source Oral, resp. rate 20, height _0  (1.753 m), weight 161 lb 11.2 oz (73.347 kg), SpO2 100.00%.  GENERAL:alert, no distress and comfortable SKIN: skin color, texture, turgor are normal, no rashes or significant lesions EYES: normal, Conjunctiva are pink and non-injected, sclera clear OROPHARYNX:no exudate, no erythema and lips, buccal mucosa, and tongue normal  NECK: supple, thyroid normal size, non-tender, without nodularity LYMPH:  no palpable lymphadenopathy in the cervical, axillary or supraclavicular LUNGS: clear to auscultation and percussion with normal breathing effort HEART: regular rate & rhythm and no murmurs and no lower extremity edema ABDOMEN:abdomen soft, non-tender and normal bowel sounds Musculoskeletal:no cyanosis of digits and no clubbing  NEURO: alert & oriented x 3 with fluent speech, no focal motor/sensory deficits  Labs:  Lab Results  Component Value Date   WBC 2.8* 03/02/2014   HGB 13.3 03/02/2014   HCT 39.1 03/02/2014   MCV 96.5 03/02/2014   PLT 135* 03/02/2014   NEUTROABS 1.8 03/02/2014      Chemistry      Component Value Date/Time   NA 143 03/02/2014 0929   NA 138 11/17/2013 0650   K 3.7 03/02/2014 0929   K 4.1 11/17/2013 0650   CL 101 11/17/2013 0650   CL 102 12/05/2012 0845   CO2 32* 03/02/2014 0929   CO2 23 11/17/2013 0650   BUN 19.6 03/02/2014 0929   BUN 44* 11/17/2013 0650   CREATININE 1.0 03/02/2014 0929   CREATININE 3.22* 11/17/2013 0650   CREATININE 1.88* 06/27/2012 1039      Component Value Date/Time   CALCIUM 8.9 03/02/2014 0929   CALCIUM 6.8* 11/17/2013 0650   ALKPHOS 68 03/02/2014 0929   ALKPHOS 78 11/16/2013 0650   AST 15 03/02/2014 0929   AST 23 11/16/2013 0650   ALT 18 03/02/2014 0929   ALT 11 11/16/2013 0650   BILITOT 0.95 03/02/2014 0929   BILITOT 0.8 11/16/2013 0650      RADIOGRAPHIC STUDIES: 1. MRI of the cervical spine without IV contrast on  11/07/2009 showed disk degeneration and spondylosis, most severe at C5-C6 where there is mild to moderate spinal stenosis. There is foraminal encroachment at this level, left greater than right. There were degenerative changes at other levels which were described in detail in the body of the report.  2. Chest x-ray, 2 view, from 11/16/2009 showed no acute findings.  3. CT abdomen and pelvis without IV contrast on 06/18/2012 showed multiple lytic bone lesions highly suspicious for metastatic disease or multiple myeloma. The largest lesion seen with some cortical destruction in the anterior aspect of the iliac bone measured 3 cm. There was no evidence of colitis or diverticulitis. There was no evidence for hydronephrosis or hydroureter. There were bilateral probable renal cysts. There was an enlarged prostate gland with indentation of the urinary bladder base. There was a small right hydrocele.  4. Metastatic bone survey from 06/19/2012 showed lytic lesions in the calvaria and right hemipelvis and a possible lesion in the right humerus. This latter lesion was not specifically mentioned in the report.  5. Nuclear bone scan on 06/19/2012 showed some increased uptake  involving the femur and humerus bilaterally, possibly reflecting changes of metabolic bone disease. There was a lack of correlation with the bone scan and the findings seen on CT and x-rays, raising the possibility of multiple myeloma.  6. Renal ultrasound on 06/19/2012 showed increased echogenicity of the renal parenchyma bilaterally consistent with renal medical disease. There was no evidence for obstruction. There was a slightly enlarged prostate gland.  7. CT scan of the right humerus without IV contrast on 06/20/2012 showed 2.3-cm lucent lesion with probable mild endosteal thinning anteriorly near the bicipital groove. There were several small lytic lesions present within the humeral head, scapula, glenoid, and proximal radius. No pathologic  fracture or soft tissue mass was seen. There were a few small nodules within the visualized right lung, the largest of which was 4 mm in the upper lobe on image 78. 8. Metastatic bone survey (07/22/2011) demonstrates multiple new lytic lesions are noted with innumerable lesions noted throughout the skull, left scapula, left humerus, and both femurs. These findings are indicative of progressive myeloma. 9. Metastatic bone survey (07/21/2013) demonstrates multiple new lytic lesions are noted with innumerable lesions noted throughout the skull, left scapula, left humerus, and both femurs.  These findings are indicative of progressive myeloma.   PROCEDURES:  1. CT-guided bone marrow aspirate and biopsy was carried out on 06/24/2012 and showed 48% plasma cells.  2. Bone marrow aspirate and biopsy was carried out on 12/04/2012 and showed infiltration with 24% plasma cells.   ASSESSMENT: Trevor Michael 73 y.o. male with a history of Multiple myeloma - Plan: lenalidomide (REVLIMID) 5 MG capsule, CBC with Differential, Comprehensive metabolic panel (Cmet) - [facility], Lactate dehydrogenase (LDH) - [facility], SPEP & IFE with QIG, Kappa/lambda light chains, CBC with Differential, Basic metabolic panel (Bmet) - [facility], CBC with Differential, Basic metabolic panel (Bmet) - [facility], CBC with Differential, Basic metabolic panel (Bmet) - [facility]  Multiple myeloma, without mention of having achieved remission - Plan: acyclovir (ZOVIRAX) 400 MG tablet  PLAN:  1. Stage III IgA lambda multiple myeloma S/p auto SCT, with rapid progression while on maitenance velcade now in a VGPR --Mr. Mcinerny is continuing to do well clinically. He has been on  aggressive salvage therapy with carfilzomib/revlimid/dexa since 10/28/2013 as noted below:    --Carfilzomib 20 mg/m2 IV on days 1, 2, 8, 9, 15, 16 every 28 days;   --Lenalidomide 10 mg on day 1-21 of all cycles (decreased to 5 mg today based on delayed count recovery) ;  --Dexamethasone 40 mg  for cycles 1-4 weekly .   His MM markers appear to improve with no detectable M-spike and low total urine protein on 67 mg/day.   He is scheduled for cycle #5 starting on 07/22.   We will continue his carfilzomib at 20 mg/m2.  He saw  Dr. Samule Ohm of [place] on 02/04/2014 who recommended to continue on present regiment for at minimum 8 cycles.   --He will continue zometa 3.5 mg monthly for next 1 year at least.   2. Leukopenia. --Patient [place] 1,800.  Decreasing revlimid as noted above.   3. Follow-up -- We will schedule a lab and then start chemotherapy on 07/22 and repeat visit in 4 weeks.   We will check MM markers monthly and labs prior to chemotherapy on days 1, 8, 15.  All questions were answered. The patient knows to call the clinic with any problems, questions or concerns. We can certainly see the patient much sooner if necessary.  I spent  15 minutes counseling the patient face to face. The total time spent in the appointment was 25 minutes.    Ossie Beltran, MD 03/02/2014 9:38 PM

## 2014-03-02 NOTE — Telephone Encounter (Signed)
Pt confirmed labs/ov per 07/14 POF, gave AVS to pt and sent msg to [facility] to add chemo and will call pt w/times...Marland KitchenMarland KitchenKJ

## 2014-03-02 NOTE — Telephone Encounter (Signed)
Per staff message and POF I have scheduled appts. Advised scheduler of appts. JMW  

## 2014-03-04 ENCOUNTER — Telehealth: Payer: Self-pay | Admitting: Hematology and Oncology

## 2014-03-04 NOTE — Telephone Encounter (Signed)
pt called to confirm if appts have been added. appts have been added. returned call to pt and lmonvm for pt re next appt for 7/22 lb/tx @ 1:15pm. message to Georges Mouse to add labs. lab appt time accounted for in message to pt.

## 2014-03-08 ENCOUNTER — Encounter: Payer: Self-pay | Admitting: *Deleted

## 2014-03-08 NOTE — Progress Notes (Signed)
RECEIVED A FAX FROM [facility] CONCERNING A CONFIRMATION OF PRESCRIPTION SHIPMENT FOR REVLIMID ON 03/05/14. 

## 2014-03-10 ENCOUNTER — Other Ambulatory Visit: Payer: Self-pay | Admitting: Internal Medicine

## 2014-03-10 ENCOUNTER — Ambulatory Visit (HOSPITAL_BASED_OUTPATIENT_CLINIC_OR_DEPARTMENT_OTHER): Payer: Medicare Other

## 2014-03-10 ENCOUNTER — Other Ambulatory Visit (HOSPITAL_BASED_OUTPATIENT_CLINIC_OR_DEPARTMENT_OTHER): Payer: Medicare Other

## 2014-03-10 VITALS — BP 148/75 | HR 57 | Temp 97.0°F

## 2014-03-10 DIAGNOSIS — C9000 Multiple myeloma not having achieved remission: Secondary | ICD-10-CM

## 2014-03-10 DIAGNOSIS — Z5112 Encounter for antineoplastic immunotherapy: Secondary | ICD-10-CM

## 2014-03-10 LAB — BASIC METABOLIC PANEL (CC13)
ANION GAP: 8 meq/L (ref 3–11)
BUN: 17.9 mg/dL (ref 7.0–26.0)
CO2: 32 meq/L — AB (ref 22–29)
Calcium: 8.7 mg/dL (ref 8.4–10.4)
Chloride: 104 mEq/L (ref 98–109)
Creatinine: 1.1 mg/dL (ref 0.7–1.3)
Glucose: 79 mg/dl (ref 70–140)
POTASSIUM: 3.3 meq/L — AB (ref 3.5–5.1)
SODIUM: 144 meq/L (ref 136–145)

## 2014-03-10 LAB — CBC WITH DIFFERENTIAL/PLATELET
BASO%: 0.7 % (ref 0.0–2.0)
BASOS ABS: 0 10*3/uL (ref 0.0–0.1)
EOS ABS: 0.2 10*3/uL (ref 0.0–0.5)
EOS%: 5 % (ref 0.0–7.0)
HCT: 38 % — ABNORMAL LOW (ref 38.4–49.9)
HEMOGLOBIN: 12.7 g/dL — AB (ref 13.0–17.1)
LYMPH%: 22.9 % (ref 14.0–49.0)
MCH: 32.7 pg (ref 27.2–33.4)
MCHC: 33.4 g/dL (ref 32.0–36.0)
MCV: 97.9 fL (ref 79.3–98.0)
MONO#: 1.3 10*3/uL — ABNORMAL HIGH (ref 0.1–0.9)
MONO%: 29.6 % — AB (ref 0.0–14.0)
NEUT#: 1.8 10*3/uL (ref 1.5–6.5)
NEUT%: 41.8 % (ref 39.0–75.0)
PLATELETS: 152 10*3/uL (ref 140–400)
RBC: 3.88 10*6/uL — AB (ref 4.20–5.82)
RDW: 15.5 % — AB (ref 11.0–14.6)
WBC: 4.3 10*3/uL (ref 4.0–10.3)
lymph#: 1 10*3/uL (ref 0.9–3.3)

## 2014-03-10 MED ORDER — SODIUM CHLORIDE 0.9 % IV SOLN
Freq: Once | INTRAVENOUS | Status: AC
  Administered 2014-03-10: 14:00:00 via INTRAVENOUS

## 2014-03-10 MED ORDER — DEXTROSE 5 % IV SOLN
20.0000 mg/m2 | Freq: Once | INTRAVENOUS | Status: AC
  Administered 2014-03-10: 38 mg via INTRAVENOUS
  Filled 2014-03-10: qty 19

## 2014-03-10 MED ORDER — DEXAMETHASONE SODIUM PHOSPHATE 10 MG/ML IJ SOLN
INTRAMUSCULAR | Status: AC
  Filled 2014-03-10: qty 1

## 2014-03-10 MED ORDER — ONDANSETRON 8 MG/50ML IVPB (CHCC)
8.0000 mg | Freq: Once | INTRAVENOUS | Status: AC
  Administered 2014-03-10: 8 mg via INTRAVENOUS

## 2014-03-10 MED ORDER — DEXAMETHASONE SODIUM PHOSPHATE 10 MG/ML IJ SOLN
10.0000 mg | Freq: Once | INTRAMUSCULAR | Status: AC
  Administered 2014-03-10: 10 mg via INTRAVENOUS

## 2014-03-10 MED ORDER — ONDANSETRON 8 MG/NS 50 ML IVPB
INTRAVENOUS | Status: AC
Start: 2014-03-10 — End: 2014-03-10
  Filled 2014-03-10: qty 8

## 2014-03-10 NOTE — Patient Instructions (Signed)
[facility] Discharge Instructions for Patients Receiving Chemotherapy  Today you received the following chemotherapy agents: Kyprolis  To help prevent nausea and vomiting after your treatment, we encourage you to take your nausea medication: as directed.   If you develop nausea and vomiting that is not controlled by your nausea medication, call the clinic.   BELOW ARE SYMPTOMS THAT SHOULD BE REPORTED IMMEDIATELY:  *FEVER GREATER THAN 100.5 F  *CHILLS WITH OR WITHOUT FEVER  NAUSEA AND VOMITING THAT IS NOT CONTROLLED WITH YOUR NAUSEA MEDICATION  *UNUSUAL SHORTNESS OF BREATH  *UNUSUAL BRUISING OR BLEEDING  TENDERNESS IN MOUTH AND THROAT WITH OR WITHOUT PRESENCE OF ULCERS  *URINARY PROBLEMS  *BOWEL PROBLEMS  UNUSUAL RASH Items with * indicate a potential emergency and should be followed up as soon as possible.  Feel free to call the clinic you have any questions or concerns. The clinic phone number is [phone].    

## 2014-03-11 ENCOUNTER — Ambulatory Visit (HOSPITAL_BASED_OUTPATIENT_CLINIC_OR_DEPARTMENT_OTHER): Payer: Medicare Other

## 2014-03-11 VITALS — BP 156/69 | HR 67 | Temp 96.9°F

## 2014-03-11 DIAGNOSIS — Z5112 Encounter for antineoplastic immunotherapy: Secondary | ICD-10-CM

## 2014-03-11 DIAGNOSIS — C9000 Multiple myeloma not having achieved remission: Secondary | ICD-10-CM

## 2014-03-11 MED ORDER — SODIUM CHLORIDE 0.9 % IV SOLN
Freq: Once | INTRAVENOUS | Status: AC
  Administered 2014-03-11: 13:00:00 via INTRAVENOUS

## 2014-03-11 MED ORDER — ONDANSETRON 8 MG/NS 50 ML IVPB
INTRAVENOUS | Status: AC
  Filled 2014-03-11: qty 8

## 2014-03-11 MED ORDER — DEXAMETHASONE SODIUM PHOSPHATE 10 MG/ML IJ SOLN
10.0000 mg | Freq: Once | INTRAMUSCULAR | Status: AC
  Administered 2014-03-11: 10 mg via INTRAVENOUS

## 2014-03-11 MED ORDER — DEXTROSE 5 % IV SOLN
20.0000 mg/m2 | Freq: Once | INTRAVENOUS | Status: AC
  Administered 2014-03-11: 38 mg via INTRAVENOUS
  Filled 2014-03-11: qty 19

## 2014-03-11 MED ORDER — ONDANSETRON 8 MG/50ML IVPB (CHCC)
8.0000 mg | Freq: Once | INTRAVENOUS | Status: AC
  Administered 2014-03-11: 8 mg via INTRAVENOUS

## 2014-03-11 NOTE — Patient Instructions (Signed)
[facility] Discharge Instructions for Patients Receiving Chemotherapy  Today you received the following chemotherapy agents kyprolis  To help prevent nausea and vomiting after your treatment, we encourage you to take your nausea medication as directed   If you develop nausea and vomiting that is not controlled by your nausea medication, call the clinic.   BELOW ARE SYMPTOMS THAT SHOULD BE REPORTED IMMEDIATELY:  *FEVER GREATER THAN 100.5 F  *CHILLS WITH OR WITHOUT FEVER  NAUSEA AND VOMITING THAT IS NOT CONTROLLED WITH YOUR NAUSEA MEDICATION  *UNUSUAL SHORTNESS OF BREATH  *UNUSUAL BRUISING OR BLEEDING  TENDERNESS IN MOUTH AND THROAT WITH OR WITHOUT PRESENCE OF ULCERS  *URINARY PROBLEMS  *BOWEL PROBLEMS  UNUSUAL RASH Items with * indicate a potential emergency and should be followed up as soon as possible.  Feel free to call the clinic you have any questions or concerns. The clinic phone number is [phone].  

## 2014-03-17 ENCOUNTER — Other Ambulatory Visit: Payer: Self-pay | Admitting: Internal Medicine

## 2014-03-17 ENCOUNTER — Ambulatory Visit (HOSPITAL_BASED_OUTPATIENT_CLINIC_OR_DEPARTMENT_OTHER): Payer: Medicare Other

## 2014-03-17 ENCOUNTER — Other Ambulatory Visit (HOSPITAL_BASED_OUTPATIENT_CLINIC_OR_DEPARTMENT_OTHER): Payer: Medicare Other

## 2014-03-17 VITALS — BP 167/72 | HR 63 | Temp 97.8°F | Resp 14

## 2014-03-17 DIAGNOSIS — Z5112 Encounter for antineoplastic immunotherapy: Secondary | ICD-10-CM

## 2014-03-17 DIAGNOSIS — C9000 Multiple myeloma not having achieved remission: Secondary | ICD-10-CM

## 2014-03-17 LAB — BASIC METABOLIC PANEL (CC13)
Anion Gap: 8 mEq/L (ref 3–11)
BUN: 18.7 mg/dL (ref 7.0–26.0)
CO2: 29 mEq/L (ref 22–29)
CREATININE: 1 mg/dL (ref 0.7–1.3)
Calcium: 9.2 mg/dL (ref 8.4–10.4)
Chloride: 105 mEq/L (ref 98–109)
GLUCOSE: 87 mg/dL (ref 70–140)
Potassium: 3.4 mEq/L — ABNORMAL LOW (ref 3.5–5.1)
Sodium: 142 mEq/L (ref 136–145)

## 2014-03-17 LAB — CBC WITH DIFFERENTIAL/PLATELET
BASO%: 0.6 % (ref 0.0–2.0)
Basophils Absolute: 0 10*3/uL (ref 0.0–0.1)
EOS%: 6.5 % (ref 0.0–7.0)
Eosinophils Absolute: 0.2 10*3/uL (ref 0.0–0.5)
HCT: 39.5 % (ref 38.4–49.9)
HGB: 13.1 g/dL (ref 13.0–17.1)
LYMPH%: 18.5 % (ref 14.0–49.0)
MCH: 32.4 pg (ref 27.2–33.4)
MCHC: 33.1 g/dL (ref 32.0–36.0)
MCV: 98 fL (ref 79.3–98.0)
MONO#: 1.5 10*3/uL — ABNORMAL HIGH (ref 0.1–0.9)
MONO%: 39.6 % — ABNORMAL HIGH (ref 0.0–14.0)
NEUT#: 1.3 10*3/uL — ABNORMAL LOW (ref 1.5–6.5)
NEUT%: 34.8 % — AB (ref 39.0–75.0)
PLATELETS: 136 10*3/uL — AB (ref 140–400)
RBC: 4.03 10*6/uL — ABNORMAL LOW (ref 4.20–5.82)
RDW: 16.1 % — ABNORMAL HIGH (ref 11.0–14.6)
WBC: 3.7 10*3/uL — ABNORMAL LOW (ref 4.0–10.3)
lymph#: 0.7 10*3/uL — ABNORMAL LOW (ref 0.9–3.3)

## 2014-03-17 MED ORDER — ZOLEDRONIC ACID 4 MG/5ML IV CONC
3.5000 mg | Freq: Once | INTRAVENOUS | Status: AC
  Administered 2014-03-17: 3.5 mg via INTRAVENOUS
  Filled 2014-03-17: qty 4.38

## 2014-03-17 MED ORDER — HEPARIN SOD (PORK) LOCK FLUSH 100 UNIT/ML IV SOLN
500.0000 [IU] | Freq: Once | INTRAVENOUS | Status: DC | PRN
  Filled 2014-03-17: qty 5

## 2014-03-17 MED ORDER — ONDANSETRON 8 MG/50ML IVPB (CHCC)
8.0000 mg | Freq: Once | INTRAVENOUS | Status: AC
  Administered 2014-03-17: 8 mg via INTRAVENOUS

## 2014-03-17 MED ORDER — SODIUM CHLORIDE 0.9 % IV SOLN
Freq: Once | INTRAVENOUS | Status: AC
  Administered 2014-03-17: 14:00:00 via INTRAVENOUS

## 2014-03-17 MED ORDER — DEXAMETHASONE SODIUM PHOSPHATE 10 MG/ML IJ SOLN
10.0000 mg | Freq: Once | INTRAMUSCULAR | Status: AC
  Administered 2014-03-17: 10 mg via INTRAVENOUS

## 2014-03-17 MED ORDER — DEXAMETHASONE SODIUM PHOSPHATE 10 MG/ML IJ SOLN
INTRAMUSCULAR | Status: AC
  Filled 2014-03-17: qty 1

## 2014-03-17 MED ORDER — ONDANSETRON 8 MG/NS 50 ML IVPB
INTRAVENOUS | Status: AC
  Filled 2014-03-17: qty 8

## 2014-03-17 MED ORDER — DEXTROSE 5 % IV SOLN
20.0000 mg/m2 | Freq: Once | INTRAVENOUS | Status: AC
  Administered 2014-03-17: 38 mg via INTRAVENOUS
  Filled 2014-03-17: qty 19

## 2014-03-17 MED ORDER — SODIUM CHLORIDE 0.9 % IJ SOLN
10.0000 mL | INTRAMUSCULAR | Status: DC | PRN
  Filled 2014-03-17: qty 10

## 2014-03-17 NOTE — Patient Instructions (Signed)
[place] Discharge Instructions for Patients Receiving Chemotherapy  Today you received the following chemotherapy agents kyprolis.  To help prevent nausea and vomiting after your treatment, we encourage you to take your nausea medication as prescribed.  If you develop nausea and vomiting that is not controlled by your nausea medication, call the clinic.   BELOW ARE SYMPTOMS THAT SHOULD BE REPORTED IMMEDIATELY:  *FEVER GREATER THAN 100.5 F  *CHILLS WITH OR WITHOUT FEVER  NAUSEA AND VOMITING THAT IS NOT CONTROLLED WITH YOUR NAUSEA MEDICATION  *UNUSUAL SHORTNESS OF BREATH  *UNUSUAL BRUISING OR BLEEDING  TENDERNESS IN MOUTH AND THROAT WITH OR WITHOUT PRESENCE OF ULCERS  *URINARY PROBLEMS  *BOWEL PROBLEMS  UNUSUAL RASH Items with * indicate a potential emergency and should be followed up as soon as possible.  Feel free to call the clinic you have any questions or concerns. The clinic phone number is [phone].

## 2014-03-17 NOTE — Progress Notes (Signed)
Spoke with Dr.Chisim about patients anc being 1.4 and potassium 3.4. Proceed with treatment and instruct patient to take an extra dose of potassium tonight before bed. Patient verbalized understanding.

## 2014-03-18 ENCOUNTER — Ambulatory Visit (HOSPITAL_BASED_OUTPATIENT_CLINIC_OR_DEPARTMENT_OTHER): Payer: Medicare Other

## 2014-03-18 VITALS — BP 157/75 | HR 75 | Temp 97.8°F | Resp 18

## 2014-03-18 DIAGNOSIS — Z5112 Encounter for antineoplastic immunotherapy: Secondary | ICD-10-CM

## 2014-03-18 DIAGNOSIS — C9000 Multiple myeloma not having achieved remission: Secondary | ICD-10-CM

## 2014-03-18 MED ORDER — DEXTROSE 5 % IV SOLN
20.0000 mg/m2 | Freq: Once | INTRAVENOUS | Status: AC
  Administered 2014-03-18: 38 mg via INTRAVENOUS
  Filled 2014-03-18: qty 19

## 2014-03-18 MED ORDER — DEXAMETHASONE SODIUM PHOSPHATE 10 MG/ML IJ SOLN
10.0000 mg | Freq: Once | INTRAMUSCULAR | Status: AC
  Administered 2014-03-18: 10 mg via INTRAVENOUS

## 2014-03-18 MED ORDER — SODIUM CHLORIDE 0.9 % IV SOLN
Freq: Once | INTRAVENOUS | Status: AC
  Administered 2014-03-18: 10:00:00 via INTRAVENOUS

## 2014-03-18 MED ORDER — ONDANSETRON 8 MG/50ML IVPB (CHCC)
8.0000 mg | Freq: Once | INTRAVENOUS | Status: AC
  Administered 2014-03-18: 8 mg via INTRAVENOUS

## 2014-03-18 MED ORDER — DEXAMETHASONE SODIUM PHOSPHATE 10 MG/ML IJ SOLN
INTRAMUSCULAR | Status: AC
  Filled 2014-03-18: qty 1

## 2014-03-18 MED ORDER — ONDANSETRON 8 MG/NS 50 ML IVPB
INTRAVENOUS | Status: AC
  Filled 2014-03-18: qty 8

## 2014-03-18 NOTE — Patient Instructions (Signed)
[facility] Discharge Instructions for Patients Receiving Chemotherapy  Today you received the following chemotherapy agents Kyprolis To help prevent nausea and vomiting after your treatment, we encourage you to take your nausea medication as prescribed.  If you develop nausea and vomiting that is not controlled by your nausea medication, call the clinic.   BELOW ARE SYMPTOMS THAT SHOULD BE REPORTED IMMEDIATELY:  *FEVER GREATER THAN 100.5 F  *CHILLS WITH OR WITHOUT FEVER  NAUSEA AND VOMITING THAT IS NOT CONTROLLED WITH YOUR NAUSEA MEDICATION  *UNUSUAL SHORTNESS OF BREATH  *UNUSUAL BRUISING OR BLEEDING  TENDERNESS IN MOUTH AND THROAT WITH OR WITHOUT PRESENCE OF ULCERS  *URINARY PROBLEMS  *BOWEL PROBLEMS  UNUSUAL RASH Items with * indicate a potential emergency and should be followed up as soon as possible.  Feel free to call the clinic you have any questions or concerns. The clinic phone number is [phone].    

## 2014-03-24 ENCOUNTER — Other Ambulatory Visit: Payer: Self-pay | Admitting: Hematology and Oncology

## 2014-03-24 ENCOUNTER — Ambulatory Visit (HOSPITAL_BASED_OUTPATIENT_CLINIC_OR_DEPARTMENT_OTHER): Payer: Medicare Other

## 2014-03-24 ENCOUNTER — Other Ambulatory Visit (HOSPITAL_BASED_OUTPATIENT_CLINIC_OR_DEPARTMENT_OTHER): Payer: Medicare Other

## 2014-03-24 VITALS — BP 164/65 | HR 66 | Temp 98.2°F | Resp 18

## 2014-03-24 DIAGNOSIS — C9000 Multiple myeloma not having achieved remission: Secondary | ICD-10-CM

## 2014-03-24 DIAGNOSIS — Z5112 Encounter for antineoplastic immunotherapy: Secondary | ICD-10-CM

## 2014-03-24 LAB — CBC WITH DIFFERENTIAL/PLATELET
BASO%: 0.4 % (ref 0.0–2.0)
BASOS ABS: 0 10*3/uL (ref 0.0–0.1)
EOS%: 3.5 % (ref 0.0–7.0)
Eosinophils Absolute: 0.2 10*3/uL (ref 0.0–0.5)
HEMATOCRIT: 39.5 % (ref 38.4–49.9)
HGB: 13.1 g/dL (ref 13.0–17.1)
LYMPH#: 0.7 10*3/uL — AB (ref 0.9–3.3)
LYMPH%: 15.6 % (ref 14.0–49.0)
MCH: 32.7 pg (ref 27.2–33.4)
MCHC: 33.3 g/dL (ref 32.0–36.0)
MCV: 98.3 fL — AB (ref 79.3–98.0)
MONO#: 1.1 10*3/uL — ABNORMAL HIGH (ref 0.1–0.9)
MONO%: 25 % — AB (ref 0.0–14.0)
NEUT#: 2.4 10*3/uL (ref 1.5–6.5)
NEUT%: 55.5 % (ref 39.0–75.0)
PLATELETS: 170 10*3/uL (ref 140–400)
RBC: 4.01 10*6/uL — ABNORMAL LOW (ref 4.20–5.82)
RDW: 16.4 % — ABNORMAL HIGH (ref 11.0–14.6)
WBC: 4.4 10*3/uL (ref 4.0–10.3)

## 2014-03-24 LAB — BASIC METABOLIC PANEL (CC13)
ANION GAP: 10 meq/L (ref 3–11)
BUN: 17.1 mg/dL (ref 7.0–26.0)
CALCIUM: 9.4 mg/dL (ref 8.4–10.4)
CO2: 27 mEq/L (ref 22–29)
Chloride: 105 mEq/L (ref 98–109)
Creatinine: 1 mg/dL (ref 0.7–1.3)
Glucose: 109 mg/dl (ref 70–140)
Potassium: 3.5 mEq/L (ref 3.5–5.1)
Sodium: 142 mEq/L (ref 136–145)

## 2014-03-24 MED ORDER — DEXAMETHASONE SODIUM PHOSPHATE 10 MG/ML IJ SOLN
INTRAMUSCULAR | Status: AC
  Filled 2014-03-24: qty 1

## 2014-03-24 MED ORDER — DEXTROSE 5 % IV SOLN
20.0000 mg/m2 | Freq: Once | INTRAVENOUS | Status: AC
  Administered 2014-03-24: 38 mg via INTRAVENOUS
  Filled 2014-03-24: qty 19

## 2014-03-24 MED ORDER — ONDANSETRON 8 MG/50ML IVPB (CHCC)
8.0000 mg | Freq: Once | INTRAVENOUS | Status: AC
  Administered 2014-03-24: 8 mg via INTRAVENOUS

## 2014-03-24 MED ORDER — SODIUM CHLORIDE 0.9 % IV SOLN
Freq: Once | INTRAVENOUS | Status: AC
  Administered 2014-03-24: 16:00:00 via INTRAVENOUS

## 2014-03-24 MED ORDER — DEXAMETHASONE SODIUM PHOSPHATE 10 MG/ML IJ SOLN
10.0000 mg | Freq: Once | INTRAMUSCULAR | Status: AC
  Administered 2014-03-24: 10 mg via INTRAVENOUS

## 2014-03-25 ENCOUNTER — Ambulatory Visit (HOSPITAL_BASED_OUTPATIENT_CLINIC_OR_DEPARTMENT_OTHER): Payer: Medicare Other

## 2014-03-25 ENCOUNTER — Other Ambulatory Visit: Payer: Self-pay | Admitting: Hematology and Oncology

## 2014-03-25 VITALS — BP 155/66 | HR 68 | Temp 98.0°F | Resp 20

## 2014-03-25 DIAGNOSIS — C9000 Multiple myeloma not having achieved remission: Secondary | ICD-10-CM

## 2014-03-25 DIAGNOSIS — Z5112 Encounter for antineoplastic immunotherapy: Secondary | ICD-10-CM

## 2014-03-25 MED ORDER — ONDANSETRON 8 MG/NS 50 ML IVPB
INTRAVENOUS | Status: AC
  Filled 2014-03-25: qty 8

## 2014-03-25 MED ORDER — DEXAMETHASONE SODIUM PHOSPHATE 10 MG/ML IJ SOLN
INTRAMUSCULAR | Status: AC
  Filled 2014-03-25: qty 1

## 2014-03-25 MED ORDER — DEXTROSE 5 % IV SOLN
20.0000 mg/m2 | Freq: Once | INTRAVENOUS | Status: AC
  Administered 2014-03-25: 38 mg via INTRAVENOUS
  Filled 2014-03-25: qty 19

## 2014-03-25 MED ORDER — SODIUM CHLORIDE 0.9 % IV SOLN
Freq: Once | INTRAVENOUS | Status: AC
  Administered 2014-03-25: 15:00:00 via INTRAVENOUS

## 2014-03-25 MED ORDER — SODIUM CHLORIDE 0.9 % IV SOLN: Freq: Once | INTRAVENOUS | Status: DC

## 2014-03-25 MED ORDER — DEXAMETHASONE SODIUM PHOSPHATE 10 MG/ML IJ SOLN
10.0000 mg | Freq: Once | INTRAMUSCULAR | Status: AC
  Administered 2014-03-25: 10 mg via INTRAVENOUS

## 2014-03-25 MED ORDER — ONDANSETRON 8 MG/50ML IVPB (CHCC)
8.0000 mg | Freq: Once | INTRAVENOUS | Status: AC
  Administered 2014-03-25: 8 mg via INTRAVENOUS

## 2014-03-25 NOTE — Patient Instructions (Signed)
Carfilzomib injection What is this medicine? CARFILZOMIB (kar FILZ oh mib) is a chemotherapy drug that works by slowing or stopping cancer cell growth. This medicine is used to treat multiple myeloma. This medicine may be used for other purposes; ask your health care provider or pharmacist if you have questions. COMMON BRAND NAME(S): KYPROLIS What should I tell my health care provider before I take this medicine? They need to know if you have any of these conditions: -heart disease -irregular heartbeat -liver disease -lung or breathing disease -an unusual or allergic reaction to carfilzomib, or other medicines, foods, dyes, or preservatives -pregnant or trying to get pregnant -breast-feeding How should I use this medicine? This medicine is for injection or infusion into a vein. It is given by a health care professional in a hospital or clinic setting. Talk to your pediatrician regarding the use of this medicine in children. Special care may be needed. Overdosage: If you think you've taken too much of this medicine contact a poison control center or emergency room at once. Overdosage: If you think you have taken too much of this medicine contact a poison control center or emergency room at once. NOTE: This medicine is only for you. Do not share this medicine with others. What if I miss a dose? It is important not to miss your dose. Call your doctor or health care professional if you are unable to keep an appointment. What may interact with this medicine? Interactions are not expected. Give your health care provider a list of all the medicines, herbs, non-prescription drugs, or dietary supplements you use. Also tell them if you smoke, drink alcohol, or use illegal drugs. Some items may interact with your medicine. This list may not describe all possible interactions. Give your health care provider a list of all the medicines, herbs, non-prescription drugs, or dietary supplements you use. Also  tell them if you smoke, drink alcohol, or use illegal drugs. Some items may interact with your medicine. What should I watch for while using this medicine? Your condition will be monitored carefully while you are receiving this medicine. Report any side effects. Continue your course of treatment even though you feel ill unless your doctor tells you to stop. Call your doctor or health care professional for advice if you get a fever, chills or sore throat, or other symptoms of a cold or flu. Do not treat yourself. Try to avoid being around people who are sick. Do not become pregnant while taking this medicine. Women should inform their doctor if they wish to become pregnant or think they might be pregnant. There is a potential for serious side effects to an unborn child. Talk to your health care professional or pharmacist for more information. Do not breast-feed an infant while taking this medicine. Check with your doctor or health care professional if you get an attack of severe diarrhea, nausea and vomiting, or if you sweat a lot. The loss of too much body fluid can make it dangerous for you to take this medicine. You may get dizzy. Do not drive, use machinery, or do anything that needs mental alertness until you know how this medicine affects you. Do not stand or sit up quickly, especially if you are an older patient. This reduces the risk of dizzy or fainting spells. What side effects may I notice from receiving this medicine? Side effects that you should report to your doctor or health care professional as soon as possible: -allergic reactions like skin rash, itching or hives,  swelling of the face, lips, or tongue -breathing problems -chest pain or palpitationschest tightness -cough -dark urine -dizziness -feeling faint or lightheaded -fever or chills -general ill feeling or flu-like symptoms -light-colored stools -palpitations -right upper belly pain -swelling of the legs or ankles -unusual  bleeding or bruising -unusually weak or tired -yellowing of the eyes or skin Side effects that usually do not require medical attention (Report these to your doctor or health care professional if they continue or are bothersome.): -diarrhea -headache -nausea, vomiting -tiredness This list may not describe all possible side effects. Call your doctor for medical advice about side effects. You may report side effects to FDA at 1-800-FDA-1088. Where should I keep my medicine? This drug is given in a hospital or clinic and will not be stored at home. NOTE: This sheet is a summary. It may not cover all possible information. If you have questions about this medicine, talk to your doctor, pharmacist, or health care provider.  2015, Elsevier/Gold Standard. (2012-01-25 17:02:29)

## 2014-03-29 ENCOUNTER — Telehealth: Payer: Self-pay | Admitting: Internal Medicine

## 2014-03-29 NOTE — Telephone Encounter (Signed)
Pt cld to r/s due to pt's wife having surgery, mailed new schedule to pt...KJ

## 2014-03-30 ENCOUNTER — Encounter: Payer: Self-pay | Admitting: Radiation Oncology

## 2014-04-04 ENCOUNTER — Other Ambulatory Visit: Payer: Self-pay | Admitting: Internal Medicine

## 2014-04-05 ENCOUNTER — Other Ambulatory Visit: Payer: Medicare Other

## 2014-04-05 ENCOUNTER — Ambulatory Visit: Payer: Medicare Other | Admitting: Hematology and Oncology

## 2014-04-05 ENCOUNTER — Telehealth: Payer: Self-pay | Admitting: *Deleted

## 2014-04-05 NOTE — Telephone Encounter (Signed)
Called patient to introduce myself as Prostate Oncology Navigator and coordinator of the Prostate [place], to confirm his referral for the clinic on 04/13/14, arrival time of 12:00, format of clinic.  He is currently receiving tmt at [facility] and confirmed his familiarity with arrival and registration procedures.  I indicated I am mailing a packet of information, including medical information forms which he should complete and bring to the [facility].  He confirmed his mailing address.   I provided my phone number and encouraged him to call me if he has any questions after receiving the Information Packet or prior to my call the day before clinic.  He verbalized understanding and expressed appreciation for my call.  Gayleen Orem, RN, BSN, [place] at [place] [phone]

## 2014-04-06 ENCOUNTER — Other Ambulatory Visit: Payer: Self-pay | Admitting: Internal Medicine

## 2014-04-06 ENCOUNTER — Ambulatory Visit (HOSPITAL_BASED_OUTPATIENT_CLINIC_OR_DEPARTMENT_OTHER): Payer: Medicare Other | Admitting: Internal Medicine

## 2014-04-06 ENCOUNTER — Telehealth: Payer: Self-pay | Admitting: Internal Medicine

## 2014-04-06 ENCOUNTER — Ambulatory Visit (HOSPITAL_BASED_OUTPATIENT_CLINIC_OR_DEPARTMENT_OTHER): Payer: Medicare Other

## 2014-04-06 ENCOUNTER — Other Ambulatory Visit: Payer: Self-pay

## 2014-04-06 ENCOUNTER — Other Ambulatory Visit (HOSPITAL_BASED_OUTPATIENT_CLINIC_OR_DEPARTMENT_OTHER): Payer: Medicare Other

## 2014-04-06 ENCOUNTER — Other Ambulatory Visit: Payer: Self-pay | Admitting: *Deleted

## 2014-04-06 VITALS — BP 158/73 | HR 64 | Temp 98.4°F | Resp 18 | Ht 69.0 in | Wt 163.5 lb

## 2014-04-06 DIAGNOSIS — C9000 Multiple myeloma not having achieved remission: Secondary | ICD-10-CM

## 2014-04-06 DIAGNOSIS — D72819 Decreased white blood cell count, unspecified: Secondary | ICD-10-CM

## 2014-04-06 DIAGNOSIS — Z5112 Encounter for antineoplastic immunotherapy: Secondary | ICD-10-CM

## 2014-04-06 LAB — CBC WITH DIFFERENTIAL/PLATELET
BASO%: 1.3 % (ref 0.0–2.0)
Basophils Absolute: 0 10*3/uL (ref 0.0–0.1)
EOS ABS: 0.1 10*3/uL (ref 0.0–0.5)
EOS%: 2.8 % (ref 0.0–7.0)
HEMATOCRIT: 38.7 % (ref 38.4–49.9)
HGB: 13 g/dL (ref 13.0–17.1)
LYMPH%: 18.4 % (ref 14.0–49.0)
MCH: 33 pg (ref 27.2–33.4)
MCHC: 33.6 g/dL (ref 32.0–36.0)
MCV: 98.1 fL — AB (ref 79.3–98.0)
MONO#: 0.7 10*3/uL (ref 0.1–0.9)
MONO%: 21.7 % — AB (ref 0.0–14.0)
NEUT#: 1.8 10*3/uL (ref 1.5–6.5)
NEUT%: 55.8 % (ref 39.0–75.0)
Platelets: 170 10*3/uL (ref 140–400)
RBC: 3.95 10*6/uL — ABNORMAL LOW (ref 4.20–5.82)
RDW: 16.5 % — ABNORMAL HIGH (ref 11.0–14.6)
WBC: 3.2 10*3/uL — ABNORMAL LOW (ref 4.0–10.3)
lymph#: 0.6 10*3/uL — ABNORMAL LOW (ref 0.9–3.3)

## 2014-04-06 LAB — COMPREHENSIVE METABOLIC PANEL (CC13)
ALBUMIN: 3.8 g/dL (ref 3.5–5.0)
ALT: 14 U/L (ref 0–55)
AST: 18 U/L (ref 5–34)
Alkaline Phosphatase: 55 U/L (ref 40–150)
Anion Gap: 9 mEq/L (ref 3–11)
BILIRUBIN TOTAL: 0.92 mg/dL (ref 0.20–1.20)
BUN: 17.8 mg/dL (ref 7.0–26.0)
CO2: 28 mEq/L (ref 22–29)
Calcium: 9.2 mg/dL (ref 8.4–10.4)
Chloride: 105 mEq/L (ref 98–109)
Creatinine: 1 mg/dL (ref 0.7–1.3)
Glucose: 80 mg/dl (ref 70–140)
POTASSIUM: 3.4 meq/L — AB (ref 3.5–5.1)
Sodium: 143 mEq/L (ref 136–145)
Total Protein: 6.2 g/dL — ABNORMAL LOW (ref 6.4–8.3)

## 2014-04-06 LAB — LACTATE DEHYDROGENASE (CC13): LDH: 247 U/L — ABNORMAL HIGH (ref 125–245)

## 2014-04-06 MED ORDER — DEXAMETHASONE SODIUM PHOSPHATE 10 MG/ML IJ SOLN
10.0000 mg | Freq: Once | INTRAMUSCULAR | Status: AC
  Administered 2014-04-06: 10 mg via INTRAVENOUS

## 2014-04-06 MED ORDER — ONDANSETRON 8 MG/50ML IVPB (CHCC)
8.0000 mg | Freq: Once | INTRAVENOUS | Status: AC
Start: 2014-04-06 — End: 2014-04-06
  Administered 2014-04-06: 8 mg via INTRAVENOUS

## 2014-04-06 MED ORDER — SODIUM CHLORIDE 0.9 % IV SOLN
Freq: Once | INTRAVENOUS | Status: AC
  Administered 2014-04-06: 10:00:00 via INTRAVENOUS

## 2014-04-06 MED ORDER — LENALIDOMIDE 5 MG PO CAPS: ORAL_CAPSULE | ORAL | Status: DC

## 2014-04-06 MED ORDER — ONDANSETRON 8 MG/NS 50 ML IVPB
INTRAVENOUS | Status: AC
Start: 2014-04-06 — End: 2014-04-06
  Filled 2014-04-06: qty 8

## 2014-04-06 MED ORDER — DEXTROSE 5 % IV SOLN
20.0000 mg/m2 | Freq: Once | INTRAVENOUS | Status: AC
  Administered 2014-04-06: 38 mg via INTRAVENOUS
  Filled 2014-04-06: qty 19

## 2014-04-06 MED ORDER — DEXAMETHASONE SODIUM PHOSPHATE 10 MG/ML IJ SOLN
INTRAMUSCULAR | Status: AC
  Filled 2014-04-06: qty 1

## 2014-04-06 NOTE — Telephone Encounter (Signed)
THIS REFILL REQUEST FOR REVLIMID WAS GIVEN TO DR.CHISM'S NURSE, KATHY BUYCK,RN. 

## 2014-04-06 NOTE — Patient Instructions (Signed)
[facility] Discharge Instructions for Patients Receiving Chemotherapy  Today you received the following chemotherapy agents Kyprolis.  To help prevent nausea and vomiting after your treatment, we encourage you to take your nausea medication.   If you develop nausea and vomiting that is not controlled by your nausea medication, call the clinic.   BELOW ARE SYMPTOMS THAT SHOULD BE REPORTED IMMEDIATELY:  *FEVER GREATER THAN 100.5 F  *CHILLS WITH OR WITHOUT FEVER  NAUSEA AND VOMITING THAT IS NOT CONTROLLED WITH YOUR NAUSEA MEDICATION  *UNUSUAL SHORTNESS OF BREATH  *UNUSUAL BRUISING OR BLEEDING  TENDERNESS IN MOUTH AND THROAT WITH OR WITHOUT PRESENCE OF ULCERS  *URINARY PROBLEMS  *BOWEL PROBLEMS  UNUSUAL RASH Items with * indicate a potential emergency and should be followed up as soon as possible.  Feel free to call the clinic you have any questions or concerns. The clinic phone number is [phone].    

## 2014-04-06 NOTE — Telephone Encounter (Signed)
gv and printed appt sched and avs for pt for Aug and SEpt.....sed added tx. °

## 2014-04-06 NOTE — Progress Notes (Signed)
[facility] OFFICE PROGRESS NOTE  Adella Hare, MD No address on file  DIAGNOSIS: Multiple myeloma - Plan: CBC with Differential, CBC with Differential, CBC with Differential, Comprehensive metabolic panel (Cmet) - [facility], IgG, IgA, IgM, Kappa/lambda light chains, CBC with Differential, Comprehensive metabolic panel (Cmet) - [facility], Lactate dehydrogenase (LDH) - [facility]  Chief Complaint  Patient presents with  . Follow-up    CURRENT THERAPY:   Aggressive salvage therapy with carfilzomib/revlimid/dexa started on 10/28/2013 as detailed below.     Multiple myeloma   05/21/2012 Initial Diagnosis Multiple myeloma, ISS stage III.   06/19/2012 Imaging Lytic lesions in the calvaria and right hemipelvis and a possible lesion in the right humerus.    06/20/2012 Imaging CT scan of the right humerus showed a 2.3 cm lucent lesion with probable mild endosteal thinning anteriorly near the bicipital groove.  several small lytic lesions in humeral head, scapula, glenoid, and proximal radius.     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 Plasma cells are lambda ligh chain restricted.    06/24/2012 Pathology Results Cytogenetics revealed the presence of normal male chormosomes with no observable clonal abnormalities.  Single cell with additional chromosome material on 3q, loss of 13 .  DNA, gain of chromosome 11.    07/01/2012 - 01/02/2013 Chemotherapy Velcade, cytoxan and decadron. doses were increased on 11/06/2012.  Neulasta was started on 08/16/2012 because of leukopenia.    07/10/2012 - 07/28/2012 Radiation Therapy He received XRT to the right humerus 25 Gy in 10 fractions.    12/04/2012 Bone Marrow Biopsy Hypercllular for age with trilineage hematopoiesis in addition to increased number of atypical plasma cells estimated at 24% of all cells in the aspirate. Lamda light chain restriction. rare  circulating plasma cells.   12/04/2012 Pathology Results Cytogenetics revealed the presence of 2 clonal cell lines. first line with chromosal normal (25%).  2nd line was abnormal, missing a Y chromosome. 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 FISH showed gain of 11.    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  S/p melphalan 140 mg /m2 on 01/27/2013.    06/27/2013 Tumor Marker 24-hour urine yielded 17.1 gram of protein of which 16.9 grams was free lambda light chains.  Beta 2 microglobulin was 5.8. Albumin 3.2. IgG 6,320.    07/07/2013 - 09/01/2013 Chemotherapy Maintenance velcade started.   09/21/2013 Progression Counts decreasing; WBC followed by Plts. Referred to [facility] for recommendations for next therapy.    10/12/2013 Bone Marrow Biopsy Done at [facility].  Per care everywhere, PLASMA CELLS COMPRISE 80% OF A PACKED BONE MARROW.    10/22/2013 Tumor Marker Labs done at [facility].  spep m-spike 0.08, FLC lambda 379.00, ratio 0. 24 hour urine, m-spike 16632, IFE positive for monoclonal lambda.   10/27/2013 -  Chemotherapy Started salvage chemotherapy with carlfozomib plus dexamethasone plus revlimid.    11/09/2013 - 11/17/2013 Hospital Admission Admitted with fever.  Started on broad spectrum antibiotics. Had severe anemia and was give hemoglobin with questionable tranfusion reaction. 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 Discharged off antibiotics.    01/21/2014 -  Chemotherapy Zometa 3.5 mg monthly started.    03/02/2014 Treatment Plan Change Decreased Revlimide to 5 mg (down from 10 mg) daily for 21 days on and 7 days off every 28 days due persistent leukopenia.      INTERVAL HISTORY: ETAN VASUDEVAN 73 y.o. 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  He was last seen by me on 03/02/2014.  As noted above he started salvage chemotherapy.  Today, he is accompanied by his wife.  He  reports improvement in his energy overall. His weight is stable.  He denies bleeding and night sweats.  He tolerated cycle #5 of his combination chemotherapy without difficulty and no hospitalizations or emergency room visits. He tolerated zometa on 03/17/2014.    MEDICAL HISTORY: Past Medical History  Diagnosis Date  . Other and unspecified hyperlipidemia   . Essential hypertension, benign   . BPH (benign prostatic hyperplasia)   . Early stage glaucoma   . Heart murmur     "since my teens" (06/18/2012)  . Cervical stenosis of spine   . GERD (gastroesophageal reflux disease)   . Bone cancer     multiple bone lesion/right humerus  . Spinal stenosis   . Chronic kidney disease     renal insufficiency  . Arthritis   . DDD (degenerative disc disease)     c-5=-c6, and spondylosis  . Cancer 05/2012    IgA lambda multiple myeloma  . Anorexia 06/25/12    1 month hx   . Diverticulitis   . History of chemotherapy 07/01/12    Velcade,Cytoxin and decadron  . Prostate cancer 07/28/13    Gleason 7, volume 50 gm  . Murmur   . History of radiation therapy 07/10/12- 07/28/12    right humerus, 25 gy in 10 fractions, Dr Sondra Come    INTERIM HISTORY: has HYPERLIPIDEMIA, MILD; HYPERTENSION, MILD; BENIGN PROSTATIC HYPERTROPHY; Routine health maintenance; Rhinitis, non-allergic; Multiple myeloma; Bone cancer; Fever; Syncope; Dehydration; Acute renal failure; Acute hemolysis-non immune; Nausea & vomiting; Pancytopenia due to chemotherapy; Pulmonary edema; and Elevated troponin on his problem list.    ALLERGIES:  has No Known Allergies.  MEDICATIONS: has a current medication list which includes the following prescription(s): acetaminophen, acyclovir, allopurinol, aspirin ec, atenolol-chlorthalidone, bimatoprost, brimonidine-timolol, dexamethasone, esomeprazole, feeding supplement (resource breeze), folic acid, loperamide, loratadine, potassium chloride sa, prochlorperazine, senna, simvastatin, sodium  bicarbonate, tamsulosin, and lenalidomide.  SURGICAL HISTORY:  Past Surgical History  Procedure Laterality Date  . Lipoma excision  07/1986    left shoulder/scapula  . Esophagogastroduodenoscopy  06/20/2012    Procedure: ESOPHAGOGASTRODUODENOSCOPY (EGD);  Surgeon: Beryle Beams, MD;  Location: [facility] ENDOSCOPY;  Service: Endoscopy;  Laterality: N/A;  . Prostate biopsy  2008    benign  . Prostate biopsy  07/28/13    Gleason 7  . Shoulder surgery     PROBLEM LIST:  1. Renal insufficiency. Creatinine clearance on 06/27/2012 was 68 mL/min.  2. A 2.3 cm lytic lesion in the right humerus at risk for pathologic fracture. Radiation treatments were administered to the right humerus 25 Gy in 10 fractions from 07/10/2012 through 07/28/2012.  3. Hypertension.  4. Benign prostate hypertrophy and elevated prostate specific antigen, under the care of Dr. Rana Snare. The patient had a prostate biopsy in 2008 that was benign.  5. Dyslipidemia.  6. Arthritis involving the cervical spine  7. Glaucoma.   REVIEW OF SYSTEMS:   Constitutional: Denies fevers, chills but reports some weight loss since recent hospitalization but it is leveling off Eyes: Denies blurriness of vision Ears, nose, mouth, throat, and face: Denies mucositis or sore throat Respiratory: Denies cough, dyspnea or wheezes Cardiovascular: Denies palpitation, chest discomfort or lower extremity swelling Gastrointestinal:  Denies nausea, heartburn or change in bowel habits Skin: Denies  abnormal skin rashes Lymphatics: Denies new lymphadenopathy or easy bruising Neurological:Denies numbness, tingling or new weaknesses Behavioral/Psych: Mood is stable, no new changes  All other systems were reviewed with the patient and are negative.  PHYSICAL EXAMINATION: ECOG PERFORMANCE STATUS: 0 - Asymptomatic  Blood pressure 158/73, pulse 64, temperature 98.4 F (36.9 C), temperature source Oral, resp. rate 18, height 5' 9"  (1.753 m), weight 163 lb  8 oz (74.163 kg), SpO2 100.00%.  GENERAL:alert, no distress and comfortable SKIN: skin color, texture, turgor are normal, no rashes or significant lesions EYES: normal, Conjunctiva are pink and non-injected, sclera clear OROPHARYNX:no exudate, no erythema and lips, buccal mucosa, and tongue normal  NECK: supple, thyroid normal size, non-tender, without nodularity LYMPH:  no palpable lymphadenopathy in the cervical, axillary or supraclavicular LUNGS: clear to auscultation and percussion with normal breathing effort HEART: regular rate & rhythm and no murmurs and no lower extremity edema ABDOMEN:abdomen soft, non-tender and normal bowel sounds Musculoskeletal:no cyanosis of digits and no clubbing  NEURO: alert & oriented x 3 with fluent speech, no focal motor/sensory deficits  Labs:  Lab Results  Component Value Date   WBC 3.2* 04/06/2014   HGB 13.0 04/06/2014   HCT 38.7 04/06/2014   MCV 98.1* 04/06/2014   PLT 170 04/06/2014   NEUTROABS 1.8 04/06/2014      Chemistry      Component Value Date/Time   NA 143 04/06/2014 0827   NA 138 11/17/2013 0650   K 3.4* 04/06/2014 0827   K 4.1 11/17/2013 0650   CL 101 11/17/2013 0650   CL 102 12/05/2012 0845   CO2 28 04/06/2014 0827   CO2 23 11/17/2013 0650   BUN 17.8 04/06/2014 0827   BUN 44* 11/17/2013 0650   CREATININE 1.0 04/06/2014 0827   CREATININE 3.22* 11/17/2013 0650   CREATININE 1.88* 06/27/2012 1039      Component Value Date/Time   CALCIUM 9.2 04/06/2014 0827   CALCIUM 6.8* 11/17/2013 0650   ALKPHOS 55 04/06/2014 0827   ALKPHOS 78 11/16/2013 0650   AST 18 04/06/2014 0827   AST 23 11/16/2013 0650   ALT 14 04/06/2014 0827   ALT 11 11/16/2013 0650   BILITOT 0.92 04/06/2014 0827   BILITOT 0.8 11/16/2013 0650      RADIOGRAPHIC STUDIES: 1. MRI of the cervical spine without IV contrast on 11/07/2009 showed disk degeneration and spondylosis, most severe at C5-C6 where there is mild to moderate spinal stenosis. There is foraminal encroachment at this  level, left greater than right. There were degenerative changes at other levels which were described in detail in the body of the report.  2. Chest x-ray, 2 view, from 11/16/2009 showed no acute findings.  3. CT abdomen and pelvis without IV contrast on 06/18/2012 showed multiple lytic bone lesions highly suspicious for metastatic disease or multiple myeloma. The largest lesion seen with some cortical destruction in the anterior aspect of the iliac bone measured 3 cm. There was no evidence of colitis or diverticulitis. There was no evidence for hydronephrosis or hydroureter. There were bilateral probable renal cysts. There was an enlarged prostate gland with indentation of the urinary bladder base. There was a small right hydrocele.  4. Metastatic bone survey from 06/19/2012 showed lytic lesions in the calvaria and right hemipelvis and a possible lesion in the right humerus. This latter lesion was not specifically mentioned in the report.  5. Nuclear bone scan on 06/19/2012 showed some increased uptake involving the femur and humerus bilaterally, possibly reflecting changes of  metabolic bone disease. There was a lack of correlation with the bone scan and the findings seen on CT and x-rays, raising the possibility of multiple myeloma.  6. Renal ultrasound on 06/19/2012 showed increased echogenicity of the renal parenchyma bilaterally consistent with renal medical disease. There was no evidence for obstruction. There was a slightly enlarged prostate gland.  7. CT scan of the right humerus without IV contrast on 06/20/2012 showed 2.3-cm lucent lesion with probable mild endosteal thinning anteriorly near the bicipital groove. There were several small lytic lesions present within the humeral head, scapula, glenoid, and proximal radius. No pathologic fracture or soft tissue mass was seen. There were a few small nodules within the visualized right lung, the largest of which was 4 mm in the upper lobe on image 78. 8.  Metastatic bone survey (07/22/2011) demonstrates multiple new lytic lesions are noted with innumerable lesions noted throughout the skull, left scapula, left humerus, and both femurs. These findings are indicative of progressive myeloma. 9. Metastatic bone survey (07/21/2013) demonstrates multiple new lytic lesions are noted with innumerable lesions noted throughout the skull, left scapula, left humerus, and both femurs.  These findings are indicative of progressive myeloma.   PROCEDURES:  1. CT-guided bone marrow aspirate and biopsy was carried out on 06/24/2012 and showed 48% plasma cells.  2. Bone marrow aspirate and biopsy was carried out on 12/04/2012 and showed infiltration with 24% plasma cells.   ASSESSMENT: HUGO LYBRAND 73 y.o. male with a history of Multiple myeloma - Plan: CBC with Differential, CBC with Differential, CBC with Differential, Comprehensive metabolic panel (Cmet) - [facility], IgG, IgA, IgM, Kappa/lambda light chains, CBC with Differential, Comprehensive metabolic panel (Cmet) - [facility], Lactate dehydrogenase (LDH) - [facility]  PLAN:  1. Stage III IgA lambda multiple myeloma S/p auto SCT, with rapid progression while on maitenance velcade now in a VGPR --Mr. Lua is continuing to do well clinically. He has been on  aggressive salvage therapy with carfilzomib/revlimid/dexa since 10/28/2013 as noted below:    --Carfilzomib 20 mg/m2 IV on days 1, 2, 8, 9, 15, 16 every 28 days;   --Lenalidomide 10 mg on day 1-21 of all cycles (decreased to 5 mg today based on delayed count recovery) ;  --Dexamethasone 40 mg for cycles 1-4 weekly .   His MM markers appear to improve with no detectable M-spike and low total urine protein on 67 mg/day.   He is scheduled for cycle #6 starting on 08/18.   We will continue his carfilzomib at 20 mg/m2.  He saw  Dr. Samule Ohm of [facility] on 02/04/2014 who recommended to continue on present regiment for at minimum 8 cycles.   --He will continue zometa 3.5 mg  monthly for next 1 year at least. He last received zometa on 03/17/2014 and should receive his next zometa on 04/13/2014.   2. Leukopenia. --Patient [place] 1,800.    3. Follow-up -- We will  start chemotherapy on 08/18 and repeat visit in 4 weeks.   We will check MM markers monthly and labs prior to chemotherapy on days 1, 8, 15.  All questions were answered. The patient knows to call the clinic with any problems, questions or concerns. We can certainly see the patient much sooner if necessary.  I spent 15 minutes counseling the patient face to face. The total time spent in the appointment was 25 minutes.    Jannely Henthorn, MD 04/06/2014 9:41 AM

## 2014-04-07 ENCOUNTER — Ambulatory Visit (HOSPITAL_BASED_OUTPATIENT_CLINIC_OR_DEPARTMENT_OTHER): Payer: Medicare Other

## 2014-04-07 VITALS — BP 160/63 | HR 68 | Temp 97.8°F

## 2014-04-07 DIAGNOSIS — C9000 Multiple myeloma not having achieved remission: Secondary | ICD-10-CM

## 2014-04-07 DIAGNOSIS — Z5112 Encounter for antineoplastic immunotherapy: Secondary | ICD-10-CM

## 2014-04-07 MED ORDER — DEXAMETHASONE SODIUM PHOSPHATE 10 MG/ML IJ SOLN
10.0000 mg | Freq: Once | INTRAMUSCULAR | Status: AC
  Administered 2014-04-07: 10 mg via INTRAVENOUS

## 2014-04-07 MED ORDER — DEXAMETHASONE SODIUM PHOSPHATE 10 MG/ML IJ SOLN
INTRAMUSCULAR | Status: AC
  Filled 2014-04-07: qty 1

## 2014-04-07 MED ORDER — SODIUM CHLORIDE 0.9 % IV SOLN
Freq: Once | INTRAVENOUS | Status: AC
  Administered 2014-04-07: 16:00:00 via INTRAVENOUS

## 2014-04-07 MED ORDER — DEXTROSE 5 % IV SOLN
20.0000 mg/m2 | Freq: Once | INTRAVENOUS | Status: AC
  Administered 2014-04-07: 38 mg via INTRAVENOUS
  Filled 2014-04-07: qty 19

## 2014-04-07 MED ORDER — ONDANSETRON 8 MG/50ML IVPB (CHCC)
8.0000 mg | Freq: Once | INTRAVENOUS | Status: AC
  Administered 2014-04-07: 8 mg via INTRAVENOUS

## 2014-04-07 MED ORDER — ONDANSETRON 8 MG/NS 50 ML IVPB
INTRAVENOUS | Status: AC
  Filled 2014-04-07: qty 8

## 2014-04-07 NOTE — Patient Instructions (Signed)
[facility] Discharge Instructions for Patients Receiving Chemotherapy  Today you received the following chemotherapy agents Kyprolis To help prevent nausea and vomiting after your treatment, we encourage you to take your nausea medication as prescribed.  If you develop nausea and vomiting that is not controlled by your nausea medication, call the clinic.   BELOW ARE SYMPTOMS THAT SHOULD BE REPORTED IMMEDIATELY:  *FEVER GREATER THAN 100.5 F  *CHILLS WITH OR WITHOUT FEVER  NAUSEA AND VOMITING THAT IS NOT CONTROLLED WITH YOUR NAUSEA MEDICATION  *UNUSUAL SHORTNESS OF BREATH  *UNUSUAL BRUISING OR BLEEDING  TENDERNESS IN MOUTH AND THROAT WITH OR WITHOUT PRESENCE OF ULCERS  *URINARY PROBLEMS  *BOWEL PROBLEMS  UNUSUAL RASH Items with * indicate a potential emergency and should be followed up as soon as possible.  Feel free to call the clinic you have any questions or concerns. The clinic phone number is [phone].    

## 2014-04-08 ENCOUNTER — Telehealth: Payer: Self-pay | Admitting: Oncology

## 2014-04-08 LAB — KAPPA/LAMBDA LIGHT CHAINS
Kappa free light chain: 0.15 mg/dL — ABNORMAL LOW (ref 0.33–1.94)
Kappa:Lambda Ratio: 0.01 — ABNORMAL LOW (ref 0.26–1.65)
LAMBDA FREE LGHT CHN: 30 mg/dL — AB (ref 0.57–2.63)

## 2014-04-08 LAB — SPEP & IFE WITH QIG
Albumin ELP: 67.6 % — ABNORMAL HIGH (ref 55.8–66.1)
Alpha-1-Globulin: 4.4 % (ref 2.9–4.9)
Alpha-2-Globulin: 10.9 % (ref 7.1–11.8)
BETA GLOBULIN: 6.8 % (ref 4.7–7.2)
Beta 2: 4 % (ref 3.2–6.5)
Gamma Globulin: 6.3 % — ABNORMAL LOW (ref 11.1–18.8)
IGG (IMMUNOGLOBIN G), SERUM: 350 mg/dL — AB (ref 650–1600)
IGM, SERUM: 9 mg/dL — AB (ref 41–251)
IgA: 41 mg/dL — ABNORMAL LOW (ref 68–379)
TOTAL PROTEIN, SERUM ELECTROPHOR: 5.5 g/dL — AB (ref 6.0–8.3)

## 2014-04-08 NOTE — Telephone Encounter (Signed)
Chart is ready for prostate clinic on 8/25

## 2014-04-08 NOTE — Progress Notes (Signed)
GU Location of Tumor / Histology: prostate adenocarcinoma  If Prostate Cancer, Gleason Score is (3 + 4) and PSA is (11.0 on 03/08/14)  Trevor Michael presented 8 months ago with signs/symptoms of: elevated PSA, 2 prior negative biopsies  Biopsies of prostate (if applicable) revealed:  13/1/43 volume 50 gms   Past/Anticipated interventions by urology, if any: biopsy  Past/Anticipated interventions by medical oncology, if any: none related to prostate, seeing Dr Juliann Mule for multiple myeloma  Weight changes, if any: not recently  Bowel/Bladder complaints, if any: IPSS 9  Nausea/Vomiting, if any: no  Pain issues, if any:  no  SAFETY ISSUES:  Prior radiation? 06/2012 - 07/2012 right humerus for mult myeloma, 25 Gy, Dr Sondra Come  Pacemaker/ICD? no  Possible current pregnancy? na  Is the patient on methotrexate? 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 Brother diagnosed 7 yrs ago with prostate cancer, no treatment. Undergoing treatment for mult myeloma, [facility] and [facility]

## 2014-04-09 ENCOUNTER — Other Ambulatory Visit: Payer: Self-pay | Admitting: Internal Medicine

## 2014-04-09 NOTE — Telephone Encounter (Signed)
RECEIVED A FAX FROM [facility] CONCERNING A CONFIRMATION OF PRESCRIPTION SHIPMENT FOR REVLIMID ON 04/06/14.

## 2014-04-10 ENCOUNTER — Other Ambulatory Visit: Payer: Self-pay | Admitting: Internal Medicine

## 2014-04-12 ENCOUNTER — Other Ambulatory Visit: Payer: Self-pay | Admitting: Internal Medicine

## 2014-04-12 ENCOUNTER — Telehealth: Payer: Self-pay | Admitting: *Deleted

## 2014-04-12 NOTE — Telephone Encounter (Signed)
Called patient to see if he had any questions prior to his attendance at tomorrow's Prostate [place].  He stated he did not.  He confirmed he would bring the completed health information forms he received in the mailing I sent, confirmed his understanding of a 12:00 arrival time.  He stated he has an 11:30 lab appt followed by a 3:00 pm infusion appt, I assured him attending MDs will be aware.  Gayleen Orem, RN, BSN, [place] at [place] [phone]

## 2014-04-13 ENCOUNTER — Ambulatory Visit
Admission: RE | Admit: 2014-04-13 | Discharge: 2014-04-13 | Disposition: A | Discharge status: Home / Self Care | Payer: Medicare Other | Source: Ambulatory Visit | Attending: Radiation Oncology | Admitting: Radiation Oncology

## 2014-04-13 ENCOUNTER — Encounter: Payer: Self-pay | Admitting: *Deleted

## 2014-04-13 ENCOUNTER — Other Ambulatory Visit: Payer: Medicare Other

## 2014-04-13 ENCOUNTER — Ambulatory Visit (HOSPITAL_BASED_OUTPATIENT_CLINIC_OR_DEPARTMENT_OTHER): Payer: Medicare Other | Admitting: Oncology

## 2014-04-13 ENCOUNTER — Ambulatory Visit (HOSPITAL_BASED_OUTPATIENT_CLINIC_OR_DEPARTMENT_OTHER): Payer: Medicare Other

## 2014-04-13 ENCOUNTER — Encounter: Payer: Self-pay | Admitting: Specialist

## 2014-04-13 ENCOUNTER — Other Ambulatory Visit (HOSPITAL_BASED_OUTPATIENT_CLINIC_OR_DEPARTMENT_OTHER): Payer: Medicare Other

## 2014-04-13 ENCOUNTER — Other Ambulatory Visit: Payer: Self-pay | Admitting: Internal Medicine

## 2014-04-13 ENCOUNTER — Ambulatory Visit: Payer: Medicare Other

## 2014-04-13 ENCOUNTER — Encounter: Payer: Self-pay | Admitting: Radiation Oncology

## 2014-04-13 VITALS — BP 174/72 | HR 65 | Temp 98.0°F | Resp 20 | Ht 69.0 in | Wt 163.1 lb

## 2014-04-13 DIAGNOSIS — C61 Malignant neoplasm of prostate: Secondary | ICD-10-CM

## 2014-04-13 DIAGNOSIS — I10 Essential (primary) hypertension: Secondary | ICD-10-CM

## 2014-04-13 DIAGNOSIS — E785 Hyperlipidemia, unspecified: Secondary | ICD-10-CM

## 2014-04-13 DIAGNOSIS — C9002 Multiple myeloma in relapse: Secondary | ICD-10-CM

## 2014-04-13 DIAGNOSIS — Z9484 Stem cells transplant status: Secondary | ICD-10-CM

## 2014-04-13 DIAGNOSIS — C9000 Multiple myeloma not having achieved remission: Secondary | ICD-10-CM

## 2014-04-13 DIAGNOSIS — Z5112 Encounter for antineoplastic immunotherapy: Secondary | ICD-10-CM

## 2014-04-13 HISTORY — DX: Personal history of irradiation: Z92.3

## 2014-04-13 HISTORY — DX: Cardiac murmur, unspecified: R01.1

## 2014-04-13 HISTORY — DX: Malignant neoplasm of prostate: C61

## 2014-04-13 LAB — BASIC METABOLIC PANEL (CC13)
ANION GAP: 9 meq/L (ref 3–11)
BUN: 20.6 mg/dL (ref 7.0–26.0)
CALCIUM: 9.1 mg/dL (ref 8.4–10.4)
CO2: 30 mEq/L — ABNORMAL HIGH (ref 22–29)
CREATININE: 1 mg/dL (ref 0.7–1.3)
Chloride: 105 mEq/L (ref 98–109)
GLUCOSE: 80 mg/dL (ref 70–140)
Potassium: 3.4 mEq/L — ABNORMAL LOW (ref 3.5–5.1)
Sodium: 143 mEq/L (ref 136–145)

## 2014-04-13 LAB — CBC WITH DIFFERENTIAL/PLATELET
BASO%: 0.4 % (ref 0.0–2.0)
Basophils Absolute: 0 10*3/uL (ref 0.0–0.1)
EOS ABS: 0.3 10*3/uL (ref 0.0–0.5)
EOS%: 7.1 % — ABNORMAL HIGH (ref 0.0–7.0)
HEMATOCRIT: 39.2 % (ref 38.4–49.9)
HGB: 13 g/dL (ref 13.0–17.1)
LYMPH%: 18 % (ref 14.0–49.0)
MCH: 32.7 pg (ref 27.2–33.4)
MCHC: 33.2 g/dL (ref 32.0–36.0)
MCV: 98.3 fL — AB (ref 79.3–98.0)
MONO#: 0.9 10*3/uL (ref 0.1–0.9)
MONO%: 24.6 % — AB (ref 0.0–14.0)
NEUT#: 1.8 10*3/uL (ref 1.5–6.5)
NEUT%: 49.9 % (ref 39.0–75.0)
Platelets: 118 10*3/uL — ABNORMAL LOW (ref 140–400)
RBC: 3.99 10*6/uL — ABNORMAL LOW (ref 4.20–5.82)
RDW: 16.5 % — ABNORMAL HIGH (ref 11.0–14.6)
WBC: 3.7 10*3/uL — AB (ref 4.0–10.3)
lymph#: 0.7 10*3/uL — ABNORMAL LOW (ref 0.9–3.3)

## 2014-04-13 LAB — TECHNOLOGIST REVIEW

## 2014-04-13 MED ORDER — DEXAMETHASONE SODIUM PHOSPHATE 10 MG/ML IJ SOLN
10.0000 mg | Freq: Once | INTRAMUSCULAR | Status: AC
  Administered 2014-04-13: 10 mg via INTRAVENOUS

## 2014-04-13 MED ORDER — ONDANSETRON 8 MG/NS 50 ML IVPB
INTRAVENOUS | Status: AC
  Filled 2014-04-13: qty 8

## 2014-04-13 MED ORDER — DEXTROSE 5 % IV SOLN
20.0000 mg/m2 | Freq: Once | INTRAVENOUS | Status: AC
  Administered 2014-04-13: 38 mg via INTRAVENOUS
  Filled 2014-04-13: qty 19

## 2014-04-13 MED ORDER — DEXAMETHASONE SODIUM PHOSPHATE 10 MG/ML IJ SOLN
INTRAMUSCULAR | Status: AC
  Filled 2014-04-13: qty 1

## 2014-04-13 MED ORDER — ONDANSETRON 16 MG/50ML IVPB (CHCC)
INTRAVENOUS | Status: AC
  Filled 2014-04-13: qty 16

## 2014-04-13 MED ORDER — ONDANSETRON 8 MG/50ML IVPB (CHCC)
8.0000 mg | Freq: Once | INTRAVENOUS | Status: AC
  Administered 2014-04-13: 8 mg via INTRAVENOUS

## 2014-04-13 MED ORDER — DEXAMETHASONE SODIUM PHOSPHATE 20 MG/5ML IJ SOLN
INTRAMUSCULAR | Status: AC
  Filled 2014-04-13: qty 5

## 2014-04-13 MED ORDER — SODIUM CHLORIDE 0.9 % IV SOLN
Freq: Once | INTRAVENOUS | Status: AC
  Administered 2014-04-13: 15:00:00 via INTRAVENOUS

## 2014-04-13 MED ORDER — SODIUM CHLORIDE 0.9 % IV SOLN
Freq: Once | INTRAVENOUS | Status: AC
  Administered 2014-04-13: 16:00:00 via INTRAVENOUS

## 2014-04-13 NOTE — Consult Note (Signed)
Chief Complaint  Prostate Cancer   Reason For Visit  To discuss treatment options for prostate cancer. Location of consult: [place] PCP: Dr. Adella Hare Oncologist: Dr. Concha Norway, Dr. Jaclyn Prime   History of Present Illness  [name] is a 73 year old gentleman with a history of stage III IgA lambda multiple myeloma diagnosed in 2013 with multiple lytic bone lesions on imaging at that time. Bone marrow aspirate from November 2013 demonstrated 48% plasma cells. He was initially treated with chemotherapy and an autologous bone marrow transplant at [facility] in the fall of 2014.  He progressed through maintenance treatment with Velcade and was started on treatment with Revlimid, Dexamethasone, and Carfilzomib in March 2015.   He is seen in the Prostate multidisciplinary clinic today at the request of Dr. Rana Snare.  He has been followed for over 8 years by Dr. Risa Grill for an elevated PSA.    2006: PSA 3.8, 12 core biopsy - atypical glands 2008: PCA-3 - 23.8 2009: PSA 5.5 , 12 core biopsy - HGPIN Jul 2014: PSA 12.47 Dec 2014: 12 core biopsy, Vol 46 cc, 3 of 12 cores positive for Gleason 3+4=7 adenocarcinoma Jul 2015: PSA 11.0  TNM stage: cT1c Nx Mx PSA: 11.0 Gleason score: 3+4=7 Biopsy (07/28/13): 3/12 cores positive -- R 3/6 cores positive (20% - 3+4=7, 40% - 3+4=7, 5% - 3+3=6) Prostate volume: 46 cc  Nomogram OC disease: 53% EPE: 41% SVI: 4% LNI: 4% PFS (surgery): 76% at 5 years, 41 % at 10 years  Urinary function: He has moderate lower urinary tract symptoms including a sense of incomplete emptying, urgency, and weak stream.  IPSS is 9. Erectile function: He does have pre-existing erectile dysfunction.   Past Medical History  1. History of hypercholesterolemia (V12.29)  2. History of hypertension (V12.59)  3. History of Murmur (785.2)  Surgical History  1. History of Bone Marrow Transplant  2. History of Shoulder Surgery  Current Meds  1.  Acyclovir 400 MG Oral Tablet;  Therapy: [id number] to Recorded  2. Adult Aspirin Low Strength 81 MG TBDP;  Therapy: (Recorded:27Jul2015) to Recorded  3. Allopurinol 100 MG Oral Tablet;  Therapy: [id number] to Recorded  4. Atenolol-Chlorthalidone TABS;  Therapy: (Recorded:06Mar2008) to Recorded  5. Claritin CAPS;  Therapy: (Recorded:27Jul2015) to Recorded  6. Combigan 0.2-0.5 % Ophthalmic Solution;  Therapy: [id number] to Recorded  7. Dexamethasone 4 MG Oral Tablet;  Therapy: (Recorded:27Jul2015) to Recorded  8. Folic Acid TABS;  Therapy: (Recorded:27Jul2015) to Recorded  9. Klor-Con M20 20 MEQ Oral Tablet Extended Release;  Therapy: [id number] to Recorded  10. Lumigan 0.01 % Ophthalmic Solution;   Therapy: 25Jan2011 to Recorded  11. NexIUM 40 MG Oral Capsule Delayed Release;   Therapy: [id number] to Recorded  12. Revlimid CAPS;   Therapy: (Recorded:27Jul2015) to Recorded  13. Simvastatin 20 MG Oral Tablet;   Therapy: (Recorded:27Jul2015) to Recorded  14. Sodium Bicarbonate 650 MG Oral Tablet;   Therapy: (Recorded:27Jul2015) to Recorded  15. Tamsulosin HCl - 0.4 MG Oral Capsule;   Therapy: (Recorded:27Jul2015) to Recorded  Allergies  1. No Known Drug Allergies  Family History  1. Family history of Family Health Status Number Of Children  Social History   Denied: Alcohol Use   Marital History - Currently Married   Never A Smoker   Occupation:   Denied: Tobacco Use  Review of Systems AU Complete-Male: Constitutional, skin, eye, otolaryngeal, hematologic/lymphatic, cardiovascular, pulmonary, endocrine, musculoskeletal, gastrointestinal, neurological and psychiatric system(s) were reviewed and pertinent findings if  present are noted.  Constitutional: no fever, no night sweats and no recent weight loss.  Cardiovascular: no leg swelling.  Respiratory: no shortness of breath.  Musculoskeletal: no back pain.    Physical Exam Constitutional: Well nourished and well developed  . No acute distress.    Results/Data  His pathology slides and imaging studies have been reviewed in the multidisciplinary conference today.  Findings are as dictated above.  I personally reviewed his PSA results in medical records.     Assessment  1. Prostate cancer (185)  Discussion/Summary  1.  Intermediate risk prostate cancer: Considering his life expectancy related to his myeloma, the consensus of the multidisciplinary clinic today was to proceed with surveillance of his prostate cancer.  It is felt that his 10 year life expectancy is fairly poor related to his myeloma and his prostate cancer likely not become clinically significant over that period of time.  We did discuss the option of proceeding with systemic therapy should he develop metastatic disease in the future.  We discussed surveillance and the need to follow-up for periodic PSA measurements and physical exams with Dr. Risa Grill.  I told him that I did not think repeated biopsies would likely be of benefit considering that the only real indication for proceeding with treatment would be if he developed metastatic disease with concern for development of symptoms.   The patient was counseled about the natural history of prostate cancer and the standard treatment options that are available for prostate cancer. It was explained to him how his age and life expectancy, clinical stage, Gleason score, and PSA affect his prognosis, the decision to proceed with additional staging studies, as well as how that information influences recommended treatment strategies. We discussed the roles for active surveillance, radiation therapy, surgical therapy, androgen deprivation, as well as ablative therapy options for the treatment of prostate cancer as appropriate to his individual cancer situation. We discussed the risks and benefits of these options with regard to their impact on cancer control and also in terms of potential adverse events, complications,  and impact on quiality of life particularly related to urinary, bowel, and sexual function. The patient was encouraged to ask questions throughout the discussion today and all questions were answered to his stated satisfaction. In addition, the patient was provided with and/or directed to appropriate resources and literature for further education about prostate cancer and treatment options.   Mr. Lewan and his wife asked appropriate questions which were answered to their stated satisfaction.  He will plan to follow with Dr. Risa Grill for ongoing surveillance and will continue with active treatment for his multiple myeloma.  Cc: Dr. Rana Snare Dr. Adella Hare Dr. Zola Button Dr. Concha Norway Dr. Jaclyn Prime  A total of 25 minutes were spent in the overall care of the patient today with 25 minutes in direct face to face consultation.    Signatures Electronically signed by : Raynelle Bring, M.D.; Apr 13 2014  3:44PM EST

## 2014-04-13 NOTE — Patient Instructions (Signed)
[facility] Discharge Instructions for Patients Receiving Chemotherapy  Today you received the following chemotherapy agents Kyprolis.  To help prevent nausea and vomiting after your treatment, we encourage you to take your nausea medication.   If you develop nausea and vomiting that is not controlled by your nausea medication, call the clinic.   BELOW ARE SYMPTOMS THAT SHOULD BE REPORTED IMMEDIATELY:  *FEVER GREATER THAN 100.5 F  *CHILLS WITH OR WITHOUT FEVER  NAUSEA AND VOMITING THAT IS NOT CONTROLLED WITH YOUR NAUSEA MEDICATION  *UNUSUAL SHORTNESS OF BREATH  *UNUSUAL BRUISING OR BLEEDING  TENDERNESS IN MOUTH AND THROAT WITH OR WITHOUT PRESENCE OF ULCERS  *URINARY PROBLEMS  *BOWEL PROBLEMS  UNUSUAL RASH Items with * indicate a potential emergency and should be followed up as soon as possible.  Feel free to call the clinic you have any questions or concerns. The clinic phone number is [phone].    

## 2014-04-13 NOTE — Progress Notes (Signed)
Please see consult note.  

## 2014-04-13 NOTE — Progress Notes (Signed)
Chart note:  The patient is seen today for the courtesy visit while he is here for the multidisciplinary prostate clinic. Based on his history of myeloma which is no longer in remission, we feel that he can undergo continued surveillance.

## 2014-04-13 NOTE — Progress Notes (Signed)
I met Mr. Santistevan and his spouse in the prostate clinic today. I provided them with information on available services in the Patient and [facility], as well as my contact information.  The two needs identified were adjustment to illness and sleep/insomnia. Patient also has Multiple Myeloma (MM). Provided him with information about that support group as well as the prostate group. He and his wife were very interested in the MM group.  Epifania Gore, PhD, [place]

## 2014-04-13 NOTE — Consult Note (Signed)
Reason for Referral: Prostate cancer.   HPI: 73 year old gentleman native of [place] her currently lives in [place] where he stated the majority of his life. He is currently retired but rather healthy gentleman with history of hypertension and hyperlipidemia. He was diagnosed with multiple myeloma, IgA lambda subtype and 2013. He underwent induction chemotherapy utilizing Velcade, Cytoxan and dexamethasone. His initial plasmacytosis was 48% and development. This was performed under the care of Dr. Juliann Mule. He subsequently underwent autologous stem cell transplantation in June of 2014. He subsequently was on maintenance Velcade most recently developed relapse disease in March of 2015 with 80% plasma cell infiltration. He received salvage chemotherapy in the form of carfilzomib/revlimid/dexa since 10/28/2013. He is an excellent response so far with protein studies showed M spike to be not depicted and depressed quantitative immunoglobulins. He was diagnosed with prostate cancer in December 2015 after you the PSA of 12.47. He underwent a biopsy which showed prostate cancer in 3 of the cores of the right lobe with Gleason score 3+4 equals 20% and 3+4 = 40% involvement. He also had one core 3+3 equals 6% involvement. He was referred to the prostate cancer multidisciplinary clinic for discussion regarding his prostate cancer.  Clinically, he does report constitutional symptoms of fatigue and tiredness but have adopted to chemotherapy fairly well. He is not afford any headaches or blurry vision or double vision. Does not report any syncope or seizures. He does not report any fevers or chills or sweats. He does have some slow decline in his performance status he has not been able to cough for the last year and a half. He does not report any nausea or vomiting or abdominal pain. He does report some occasional constipation or diarrhea related to his chemotherapy. He was then referred any frequency urgency or  hesitancy. He does report occasional nocturia. He does not report any skeletal pain or discomfort at this time. He does report any arthralgias or myalgias. He does not report any lymphadenopathy or petechiae. Also his review of systems unremarkable.   Past Medical History  Diagnosis Date  . Other and unspecified hyperlipidemia   . Essential hypertension, benign   . BPH (benign prostatic hyperplasia)   . Early stage glaucoma   . Heart murmur     "since my teens" (06/18/2012)  . Cervical stenosis of spine   . GERD (gastroesophageal reflux disease)   . Bone cancer     multiple bone lesion/right humerus  . Spinal stenosis   . Chronic kidney disease     renal insufficiency  . Arthritis   . DDD (degenerative disc disease)     c-5=-c6, and spondylosis  . Cancer 05/2012    IgA lambda multiple myeloma  . Anorexia 06/25/12    1 month hx   . Diverticulitis   . History of chemotherapy 07/01/12    Velcade,Cytoxin and decadron  . Prostate cancer 07/28/13    Gleason 7, volume 50 gm  . Murmur   . History of radiation therapy 07/10/12- 07/28/12    right humerus, 25 gy in 10 fractions, Dr Sondra Come  :  Past Surgical History  Procedure Laterality Date  . Lipoma excision  07/1986    left shoulder/scapula  . Esophagogastroduodenoscopy  06/20/2012    Procedure: ESOPHAGOGASTRODUODENOSCOPY (EGD);  Surgeon: Beryle Beams, MD;  Location: [facility] ENDOSCOPY;  Service: Endoscopy;  Laterality: N/A;  . Prostate biopsy  2008    benign  . Prostate biopsy  07/28/13    Gleason 7  . Shoulder  surgery    :  Current Outpatient Prescriptions  Medication Sig Dispense Refill  . acetaminophen (TYLENOL) 500 MG tablet Take 1,000 mg by mouth every 6 (six) hours as needed. For pain      . acyclovir (ZOVIRAX) 400 MG tablet Take 1 tablet (400 mg total) by mouth 2 (two) times daily.  60 tablet  5  . allopurinol (ZYLOPRIM) 100 MG tablet Take 100 mg by mouth daily.      Marland Kitchen aspirin EC 81 MG tablet Take 1 tablet (81 mg total) by  mouth daily.      Marland Kitchen atenolol-chlorthalidone (TENORETIC) 50-25 MG per tablet Take 1 tablet by mouth daily.       . bimatoprost (LUMIGAN) 0.03 % ophthalmic drops Place 1 drop into both eyes at bedtime.        . brimonidine-timolol (COMBIGAN) 0.2-0.5 % ophthalmic solution Place 1 drop into both eyes 2 (two) times daily.        Marland Kitchen dexamethasone (DECADRON) 4 MG tablet Takes 10 tablet (23m) once a week  40 tablet  2  . dexamethasone (DECADRON) 4 MG tablet TAKE 10 TABS ON DAYS 1,8,15,22 OF CHEMO X 4 CYCLES THEN TAKE 5 TABS ON DAY 1,8,15 &22 OF CHEMO  40 tablet  4  . esomeprazole (NEXIUM) 40 MG capsule Take 40 mg by mouth daily as needed (acid reflux).       . feeding supplement, RESOURCE BREEZE, (RESOURCE BREEZE) LIQD Take 1 Container by mouth daily after supper.  30 Container  0  . folic acid (FOLVITE) 1 MG tablet TAKE 1 TABLET (1 MG TOTAL) BY MOUTH DAILY.  30 tablet  3  . KLOR-CON M20 20 MEQ tablet TAKE 1 TABLET (20 MEQ TOTAL) BY MOUTH 2 (TWO) TIMES DAILY.  60 tablet  3  . lenalidomide (REVLIMID) 5 MG capsule Take one tablet (568m daily for 21 days and then off 7 days every 28 days.  21 capsule  0  . loperamide (IMODIUM) 2 MG capsule Take 2 mg by mouth as needed for diarrhea or loose stools.      . Marland Kitchenoratadine (CLARITIN) 10 MG tablet Take 10 mg by mouth daily as needed for allergies.      . Marland Kitchenrochlorperazine (COMPAZINE) 10 MG tablet Take 10 mg by mouth every 6 (six) hours as needed for nausea or vomiting.      . senna (SENOKOT) 8.6 MG TABS tablet Take 1-2 tablets by mouth daily as needed for mild constipation.       . simvastatin (ZOCOR) 20 MG tablet Take 20 mg by mouth at bedtime.      . sodium bicarbonate 650 MG tablet Take 1 tablet (650 mg total) by mouth 2 (two) times daily.  60 tablet  3  . tamsulosin (FLOMAX) 0.4 MG CAPS capsule TAKE 1 CAPSULE (0.4 MG TOTAL) BY MOUTH DAILY.  30 capsule  3   No current facility-administered medications for this visit.       No Known Allergies:  Family  History  Problem Relation Age of Onset  . Benign prostatic hyperplasia Father   . Pneumonia Father   . Coronary artery disease Father     PTCA  . Heart disease Brother     CABG, CAD,  . Benign prostatic hyperplasia Brother   :  History   Social History  . Marital Status: Married    Spouse Name: N/A    Number of Children: N/A  . Years of Education: 182 Occupational History  .  businessman     semi-retired   Social History Main Topics  . Smoking status: Never Smoker   . Smokeless tobacco: Never Used  . Alcohol Use: No  . Drug Use: No  . Sexual Activity: Not Currently    Partners: Female   Other Topics Concern  . Not on file   Social History Narrative   [employer] BS, MBA [employer].  Married Nov 09, 1961. 1 daughter '64-deceased 2023/05/21 sepsis.  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 Marriage in good health. Playing golf. Doing well overwell.   :  Pertinent items are noted in HPI.  Exam: There were no vitals taken for this visit. General appearance: alert and cooperative Head: Normocephalic, without obvious abnormality Throat: lips, mucosa, and tongue normal; teeth and gums normal Neck: no adenopathy Back: symmetric, no curvature. ROM normal. No CVA tenderness. Resp: clear to auscultation bilaterally Chest wall: no tenderness Cardio: regular rate and rhythm, S1, S2 normal, no murmur, click, rub or gallop Extremities: extremities normal, atraumatic, no cyanosis or edema Pulses: 2+ and symmetric Skin: Skin color, texture, turgor normal. No rashes or lesions   Recent Labs  04/13/14 1156  WBC 3.7*  HGB 13.0  HCT 39.2  PLT 118*    Recent Labs  04/13/14 1157  NA 143  K 3.4*  CO2 30*  GLUCOSE 80  BUN 20.6  CREATININE 1.0  CALCIUM 9.1     Assessment and  Plan:   73 year old gentleman with the following issues:   1. Prostate cancer diagnosed in December of 2015 after he presented with a PSA of 12.47. His disease is, 3+4 equals 7 in 20 and 40% of the cores respectively. ER and also one core of 3+3 equals 6 all of the right lobe of the prostate. A repeated PSA in July of 2015 was 11.0. His clinical staging is T1c prostate cancer with normal digital rectal examination. The natural course of this disease was discussed with the patient and his wife extensively today. His his case was also discussed as a part of the prostate cancer multidisciplinary clinic. His pathology was reviewed with the reviewing pathologists.   Given his aggressive nature of multiple myeloma and the fact that he has relapsed rather quickly after stem cell transplant it is very possible assume that his disease is incurable and will likely relapse at some point. His prostate cancer however it is rather indolent and asymptomatic. We do not think any intervention is warranted at this time given his low volume of disease and his stable PSA.  He develops a rapid rise in the PSA or symptomatology we can certainly consider palliation of symptoms with androgen deprivation or possible radiation therapy. I do not see any need for intervention from a prostate cancer standpoint at this time. We recommend continue surveillance with periodic PSA and clinical checkups.    2. Multiple myeloma: Followed by Dr. Juliann Mule and currently receiving systemic chemotherapy. He is tolerating it very well and his protein studies are responding. But certainly he is at risk of developing relapsed disease and potentially incurable malignancy.  All is questions were answered today their satisfaction.

## 2014-04-13 NOTE — Addendum Note (Signed)
Encounter addended by: Raynelle Bring, MD on: 04/13/2014  3:46 PM<BR>     Documentation filed: Clinical Notes

## 2014-04-14 ENCOUNTER — Ambulatory Visit (HOSPITAL_BASED_OUTPATIENT_CLINIC_OR_DEPARTMENT_OTHER): Payer: Medicare Other

## 2014-04-14 DIAGNOSIS — C9000 Multiple myeloma not having achieved remission: Secondary | ICD-10-CM

## 2014-04-14 DIAGNOSIS — C9002 Multiple myeloma in relapse: Secondary | ICD-10-CM

## 2014-04-14 DIAGNOSIS — Z5112 Encounter for antineoplastic immunotherapy: Secondary | ICD-10-CM

## 2014-04-14 MED ORDER — DEXTROSE 5 % IV SOLN
20.0000 mg/m2 | Freq: Once | INTRAVENOUS | Status: AC
  Administered 2014-04-14: 38 mg via INTRAVENOUS
  Filled 2014-04-14: qty 19

## 2014-04-14 MED ORDER — DEXAMETHASONE SODIUM PHOSPHATE 10 MG/ML IJ SOLN
INTRAMUSCULAR | Status: AC
  Filled 2014-04-14: qty 1

## 2014-04-14 MED ORDER — DEXAMETHASONE SODIUM PHOSPHATE 10 MG/ML IJ SOLN
10.0000 mg | Freq: Once | INTRAMUSCULAR | Status: AC
  Administered 2014-04-14: 10 mg via INTRAVENOUS

## 2014-04-14 MED ORDER — SODIUM CHLORIDE 0.9 % IV SOLN
Freq: Once | INTRAVENOUS | Status: AC
  Administered 2014-04-14: 13:00:00 via INTRAVENOUS

## 2014-04-14 MED ORDER — ONDANSETRON 8 MG/NS 50 ML IVPB
INTRAVENOUS | Status: AC
  Filled 2014-04-14: qty 8

## 2014-04-14 MED ORDER — ZOLEDRONIC ACID 4 MG/5ML IV CONC
3.5000 mg | Freq: Once | INTRAVENOUS | Status: AC
  Administered 2014-04-14: 3.5 mg via INTRAVENOUS
  Filled 2014-04-14: qty 4.38

## 2014-04-14 MED ORDER — ONDANSETRON 8 MG/50ML IVPB (CHCC)
8.0000 mg | Freq: Once | INTRAVENOUS | Status: AC
Start: 2014-04-14 — End: 2014-04-14
  Administered 2014-04-14: 8 mg via INTRAVENOUS

## 2014-04-14 MED ORDER — SODIUM CHLORIDE 0.9 % IV SOLN
Freq: Once | INTRAVENOUS | Status: AC
  Administered 2014-04-14: 14:00:00 via INTRAVENOUS

## 2014-04-14 NOTE — Patient Instructions (Signed)
[facility] Discharge Instructions for Patients Receiving Chemotherapy  Today you received the following chemotherapy agents: Kyprolis  To help prevent nausea and vomiting after your treatment, we encourage you to take your nausea medication: as directed.   If you develop nausea and vomiting that is not controlled by your nausea medication, call the clinic.   BELOW ARE SYMPTOMS THAT SHOULD BE REPORTED IMMEDIATELY:  *FEVER GREATER THAN 100.5 F  *CHILLS WITH OR WITHOUT FEVER  NAUSEA AND VOMITING THAT IS NOT CONTROLLED WITH YOUR NAUSEA MEDICATION  *UNUSUAL SHORTNESS OF BREATH  *UNUSUAL BRUISING OR BLEEDING  TENDERNESS IN MOUTH AND THROAT WITH OR WITHOUT PRESENCE OF ULCERS  *URINARY PROBLEMS  *BOWEL PROBLEMS  UNUSUAL RASH Items with * indicate a potential emergency and should be followed up as soon as possible.  Feel free to call the clinic you have any questions or concerns. The clinic phone number is [phone].    

## 2014-04-14 NOTE — Progress Notes (Signed)
Met with patient as part of Prostate MDC.  Reintroduced my role as his navigator and encouraged him to call as he proceeds with treatments and appointments at [facility].  Provided the accompanying Care Plan Summary:                                          Care Plan Summary  Name: Trevor Michael DOB: 06-28-1941  Your Medical Team:   Urologist -  Dr. Raynelle Bring, [facility]  Radiation Oncologist - Dr. Arloa Koh, [facility]   Medical Oncologist - Dr. Zola Button, [facility]  Recommendation: 1) Active surveillance (PSAs).  * These recommendations are based on information available as of today's consult.      Recommendations may change depending on the results of further tests or exams.  Next Steps: 1) Contact Dr. Risa Grill, [facility] When appointments need to be scheduled, you will be contacted by [facility] and/or [facility].  Questions?  Please do not hesitate to call Gayleen Orem, RN, BSN, [facility] at [phone] with any questions or concerns.  [name] is Counsellor and is available to assist you while you're receiving your medical care at [facility]. ______________________________________________________________________________________________________________________  I encouraged him to call me with any questions or concerns as his treatments progress.  He indicated understanding.  Gayleen Orem, RN, BSN, [place] [facility] [place] [phone]

## 2014-04-15 ENCOUNTER — Encounter: Payer: Self-pay | Admitting: Medical Oncology

## 2014-04-15 ENCOUNTER — Telehealth: Payer: Self-pay | Admitting: Medical Oncology

## 2014-04-15 NOTE — Telephone Encounter (Signed)
Pt called and states when he went to [facility] to pick up his prescriptions they did not have his sodium bicarbonate. He was told it was denied. I spoke with Dr. Juliann Mule and he states pt was prescribed this when his renal function was critical. Now that his renal function is normal Dr. Juliann Mule does not feel he needs to continue. Pt voiced understanding and states he is doing well.

## 2014-04-20 ENCOUNTER — Other Ambulatory Visit: Payer: Self-pay | Admitting: Hematology and Oncology

## 2014-04-20 ENCOUNTER — Ambulatory Visit (HOSPITAL_BASED_OUTPATIENT_CLINIC_OR_DEPARTMENT_OTHER): Payer: Medicare Other

## 2014-04-20 ENCOUNTER — Other Ambulatory Visit (HOSPITAL_BASED_OUTPATIENT_CLINIC_OR_DEPARTMENT_OTHER): Payer: Medicare Other

## 2014-04-20 VITALS — BP 165/74 | HR 67 | Temp 98.8°F | Resp 18

## 2014-04-20 DIAGNOSIS — Z5112 Encounter for antineoplastic immunotherapy: Secondary | ICD-10-CM

## 2014-04-20 DIAGNOSIS — C9000 Multiple myeloma not having achieved remission: Secondary | ICD-10-CM

## 2014-04-20 LAB — CBC WITH DIFFERENTIAL/PLATELET
BASO%: 0 % (ref 0.0–2.0)
BASOS ABS: 0 10*3/uL (ref 0.0–0.1)
EOS ABS: 0.2 10*3/uL (ref 0.0–0.5)
EOS%: 4.9 % (ref 0.0–7.0)
HCT: 36.7 % — ABNORMAL LOW (ref 38.4–49.9)
HEMOGLOBIN: 12.3 g/dL — AB (ref 13.0–17.1)
LYMPH#: 0.8 10*3/uL — AB (ref 0.9–3.3)
LYMPH%: 18.5 % (ref 14.0–49.0)
MCH: 33.2 pg (ref 27.2–33.4)
MCHC: 33.5 g/dL (ref 32.0–36.0)
MCV: 98.9 fL — ABNORMAL HIGH (ref 79.3–98.0)
MONO#: 0.9 10*3/uL (ref 0.1–0.9)
MONO%: 20.3 % — ABNORMAL HIGH (ref 0.0–14.0)
NEUT%: 56.3 % (ref 39.0–75.0)
NEUTROS ABS: 2.4 10*3/uL (ref 1.5–6.5)
Platelets: 122 10*3/uL — ABNORMAL LOW (ref 140–400)
RBC: 3.71 10*6/uL — ABNORMAL LOW (ref 4.20–5.82)
RDW: 15.4 % — AB (ref 11.0–14.6)
WBC: 4.3 10*3/uL (ref 4.0–10.3)
nRBC: 0 % (ref 0–0)

## 2014-04-20 LAB — BASIC METABOLIC PANEL (CC13)
Anion Gap: 10 mEq/L (ref 3–11)
BUN: 16.5 mg/dL (ref 7.0–26.0)
CHLORIDE: 106 meq/L (ref 98–109)
CO2: 25 meq/L (ref 22–29)
Calcium: 9 mg/dL (ref 8.4–10.4)
Creatinine: 0.9 mg/dL (ref 0.7–1.3)
GLUCOSE: 109 mg/dL (ref 70–140)
POTASSIUM: 3.5 meq/L (ref 3.5–5.1)
SODIUM: 141 meq/L (ref 136–145)

## 2014-04-20 MED ORDER — DEXAMETHASONE SODIUM PHOSPHATE 10 MG/ML IJ SOLN
INTRAMUSCULAR | Status: AC
  Filled 2014-04-20: qty 1

## 2014-04-20 MED ORDER — ONDANSETRON 8 MG/50ML IVPB (CHCC)
8.0000 mg | Freq: Once | INTRAVENOUS | Status: AC
  Administered 2014-04-20: 8 mg via INTRAVENOUS

## 2014-04-20 MED ORDER — CARFILZOMIB CHEMO INJECTION 60 MG
20.0000 mg/m2 | Freq: Once | INTRAVENOUS | Status: AC
  Administered 2014-04-20: 38 mg via INTRAVENOUS
  Filled 2014-04-20: qty 19

## 2014-04-20 MED ORDER — ONDANSETRON 8 MG/NS 50 ML IVPB
INTRAVENOUS | Status: AC
  Filled 2014-04-20: qty 8

## 2014-04-20 MED ORDER — SODIUM CHLORIDE 0.9 % IV SOLN
Freq: Once | INTRAVENOUS | Status: AC
  Administered 2014-04-20: 14:00:00 via INTRAVENOUS

## 2014-04-20 MED ORDER — DEXAMETHASONE SODIUM PHOSPHATE 10 MG/ML IJ SOLN
10.0000 mg | Freq: Once | INTRAMUSCULAR | Status: AC
  Administered 2014-04-20: 10 mg via INTRAVENOUS

## 2014-04-20 MED ORDER — SODIUM CHLORIDE 0.9 % IV SOLN
Freq: Once | INTRAVENOUS | Status: AC
  Administered 2014-04-20: 15:00:00 via INTRAVENOUS

## 2014-04-20 NOTE — Patient Instructions (Signed)
[facility] Discharge Instructions for Patients Receiving Chemotherapy  Today you received the following chemotherapy agents: Kyprolis  To help prevent nausea and vomiting after your treatment, we encourage you to take your nausea medication: as directed.   If you develop nausea and vomiting that is not controlled by your nausea medication, call the clinic.   BELOW ARE SYMPTOMS THAT SHOULD BE REPORTED IMMEDIATELY:  *FEVER GREATER THAN 100.5 F  *CHILLS WITH OR WITHOUT FEVER  NAUSEA AND VOMITING THAT IS NOT CONTROLLED WITH YOUR NAUSEA MEDICATION  *UNUSUAL SHORTNESS OF BREATH  *UNUSUAL BRUISING OR BLEEDING  TENDERNESS IN MOUTH AND THROAT WITH OR WITHOUT PRESENCE OF ULCERS  *URINARY PROBLEMS  *BOWEL PROBLEMS  UNUSUAL RASH Items with * indicate a potential emergency and should be followed up as soon as possible.  Feel free to call the clinic you have any questions or concerns. The clinic phone number is [phone].    

## 2014-04-21 ENCOUNTER — Ambulatory Visit (HOSPITAL_BASED_OUTPATIENT_CLINIC_OR_DEPARTMENT_OTHER): Payer: Medicare Other

## 2014-04-21 VITALS — BP 158/65 | HR 77 | Temp 98.5°F | Resp 18

## 2014-04-21 DIAGNOSIS — C9000 Multiple myeloma not having achieved remission: Secondary | ICD-10-CM

## 2014-04-21 DIAGNOSIS — Z5111 Encounter for antineoplastic chemotherapy: Secondary | ICD-10-CM

## 2014-04-21 MED ORDER — DEXAMETHASONE SODIUM PHOSPHATE 10 MG/ML IJ SOLN
10.0000 mg | Freq: Once | INTRAMUSCULAR | Status: AC
  Administered 2014-04-21: 10 mg via INTRAVENOUS

## 2014-04-21 MED ORDER — DEXTROSE 5 % IV SOLN
20.0000 mg/m2 | Freq: Once | INTRAVENOUS | Status: AC
  Administered 2014-04-21: 38 mg via INTRAVENOUS
  Filled 2014-04-21: qty 19

## 2014-04-21 MED ORDER — SODIUM CHLORIDE 0.9 % IV SOLN
Freq: Once | INTRAVENOUS | Status: AC
  Administered 2014-04-21: 14:00:00 via INTRAVENOUS

## 2014-04-21 MED ORDER — ONDANSETRON 8 MG/50ML IVPB (CHCC)
8.0000 mg | Freq: Once | INTRAVENOUS | Status: AC
  Administered 2014-04-21: 8 mg via INTRAVENOUS

## 2014-04-21 MED ORDER — DEXAMETHASONE SODIUM PHOSPHATE 10 MG/ML IJ SOLN
INTRAMUSCULAR | Status: AC
  Filled 2014-04-21: qty 1

## 2014-04-21 MED ORDER — ONDANSETRON 8 MG/NS 50 ML IVPB
INTRAVENOUS | Status: AC
  Filled 2014-04-21: qty 8

## 2014-04-21 NOTE — Patient Instructions (Signed)
[facility] Discharge Instructions for Patients Receiving Chemotherapy  Today you received the following chemotherapy agents: Kyprolis  To help prevent nausea and vomiting after your treatment, we encourage you to take your nausea medication: as directed.   If you develop nausea and vomiting that is not controlled by your nausea medication, call the clinic.   BELOW ARE SYMPTOMS THAT SHOULD BE REPORTED IMMEDIATELY:  *FEVER GREATER THAN 100.5 F  *CHILLS WITH OR WITHOUT FEVER  NAUSEA AND VOMITING THAT IS NOT CONTROLLED WITH YOUR NAUSEA MEDICATION  *UNUSUAL SHORTNESS OF BREATH  *UNUSUAL BRUISING OR BLEEDING  TENDERNESS IN MOUTH AND THROAT WITH OR WITHOUT PRESENCE OF ULCERS  *URINARY PROBLEMS  *BOWEL PROBLEMS  UNUSUAL RASH Items with * indicate a potential emergency and should be followed up as soon as possible.  Feel free to call the clinic you have any questions or concerns. The clinic phone number is [phone].    

## 2014-04-27 ENCOUNTER — Other Ambulatory Visit: Payer: Medicare Other

## 2014-04-27 ENCOUNTER — Ambulatory Visit: Payer: Medicare Other

## 2014-04-28 ENCOUNTER — Ambulatory Visit: Payer: Medicare Other

## 2014-04-28 ENCOUNTER — Other Ambulatory Visit: Payer: Self-pay | Admitting: *Deleted

## 2014-04-28 DIAGNOSIS — C9000 Multiple myeloma not having achieved remission: Secondary | ICD-10-CM

## 2014-04-28 MED ORDER — LENALIDOMIDE 5 MG PO CAPS: ORAL_CAPSULE | ORAL | Status: DC

## 2014-05-03 NOTE — Telephone Encounter (Signed)
RECEIVED A FAX FROM [facility] CONCERNING A CONFIRMATION OF PRESCRIPTION SHIPMENT FOR REVLIMID ON 04/30/14.

## 2014-05-04 ENCOUNTER — Telehealth: Payer: Self-pay | Admitting: Hematology and Oncology

## 2014-05-04 ENCOUNTER — Ambulatory Visit (HOSPITAL_BASED_OUTPATIENT_CLINIC_OR_DEPARTMENT_OTHER): Payer: Medicare Other

## 2014-05-04 ENCOUNTER — Ambulatory Visit (HOSPITAL_BASED_OUTPATIENT_CLINIC_OR_DEPARTMENT_OTHER): Payer: Medicare Other | Admitting: Hematology and Oncology

## 2014-05-04 ENCOUNTER — Other Ambulatory Visit (HOSPITAL_BASED_OUTPATIENT_CLINIC_OR_DEPARTMENT_OTHER): Payer: Medicare Other

## 2014-05-04 ENCOUNTER — Other Ambulatory Visit: Payer: Self-pay | Admitting: Hematology and Oncology

## 2014-05-04 VITALS — BP 152/65 | HR 65

## 2014-05-04 VITALS — BP 171/71 | HR 70 | Temp 98.2°F | Resp 20 | Ht 69.0 in | Wt 164.1 lb

## 2014-05-04 DIAGNOSIS — D6959 Other secondary thrombocytopenia: Secondary | ICD-10-CM

## 2014-05-04 DIAGNOSIS — C61 Malignant neoplasm of prostate: Secondary | ICD-10-CM

## 2014-05-04 DIAGNOSIS — C7952 Secondary malignant neoplasm of bone marrow: Secondary | ICD-10-CM

## 2014-05-04 DIAGNOSIS — Z23 Encounter for immunization: Secondary | ICD-10-CM

## 2014-05-04 DIAGNOSIS — C9000 Multiple myeloma not having achieved remission: Secondary | ICD-10-CM

## 2014-05-04 DIAGNOSIS — D630 Anemia in neoplastic disease: Secondary | ICD-10-CM

## 2014-05-04 DIAGNOSIS — C7951 Secondary malignant neoplasm of bone: Secondary | ICD-10-CM

## 2014-05-04 DIAGNOSIS — T50905A Adverse effect of unspecified drugs, medicaments and biological substances, initial encounter: Secondary | ICD-10-CM

## 2014-05-04 DIAGNOSIS — D61810 Antineoplastic chemotherapy induced pancytopenia: Secondary | ICD-10-CM

## 2014-05-04 DIAGNOSIS — Z299 Encounter for prophylactic measures, unspecified: Secondary | ICD-10-CM

## 2014-05-04 DIAGNOSIS — Z5112 Encounter for antineoplastic immunotherapy: Secondary | ICD-10-CM

## 2014-05-04 LAB — COMPREHENSIVE METABOLIC PANEL (CC13)
ALT: 21 U/L (ref 0–55)
ANION GAP: 10 meq/L (ref 3–11)
AST: 17 U/L (ref 5–34)
Albumin: 3.7 g/dL (ref 3.5–5.0)
Alkaline Phosphatase: 60 U/L (ref 40–150)
BUN: 17.6 mg/dL (ref 7.0–26.0)
CHLORIDE: 106 meq/L (ref 98–109)
CO2: 25 mEq/L (ref 22–29)
Calcium: 9.1 mg/dL (ref 8.4–10.4)
Creatinine: 0.9 mg/dL (ref 0.7–1.3)
GLUCOSE: 104 mg/dL (ref 70–140)
Potassium: 3.7 mEq/L (ref 3.5–5.1)
Sodium: 141 mEq/L (ref 136–145)
TOTAL PROTEIN: 6.4 g/dL (ref 6.4–8.3)
Total Bilirubin: 0.9 mg/dL (ref 0.20–1.20)

## 2014-05-04 LAB — CBC WITH DIFFERENTIAL/PLATELET
BASO%: 0.9 % (ref 0.0–2.0)
Basophils Absolute: 0 10*3/uL (ref 0.0–0.1)
EOS ABS: 0.1 10*3/uL (ref 0.0–0.5)
EOS%: 2.1 % (ref 0.0–7.0)
HCT: 38.8 % (ref 38.4–49.9)
HEMOGLOBIN: 12.9 g/dL — AB (ref 13.0–17.1)
LYMPH#: 0.8 10*3/uL — AB (ref 0.9–3.3)
LYMPH%: 17.3 % (ref 14.0–49.0)
MCH: 33 pg (ref 27.2–33.4)
MCHC: 33.2 g/dL (ref 32.0–36.0)
MCV: 99.3 fL — ABNORMAL HIGH (ref 79.3–98.0)
MONO#: 1.1 10*3/uL — ABNORMAL HIGH (ref 0.1–0.9)
MONO%: 26.1 % — ABNORMAL HIGH (ref 0.0–14.0)
NEUT#: 2.4 10*3/uL (ref 1.5–6.5)
NEUT%: 53.6 % (ref 39.0–75.0)
Platelets: 179 10*3/uL (ref 140–400)
RBC: 3.91 10*6/uL — ABNORMAL LOW (ref 4.20–5.82)
RDW: 16.1 % — ABNORMAL HIGH (ref 11.0–14.6)
WBC: 4.4 10*3/uL (ref 4.0–10.3)

## 2014-05-04 LAB — LACTATE DEHYDROGENASE (CC13): LDH: 272 U/L — ABNORMAL HIGH (ref 125–245)

## 2014-05-04 MED ORDER — CARFILZOMIB CHEMO INJECTION 60 MG
20.0000 mg/m2 | Freq: Once | INTRAVENOUS | Status: AC
  Administered 2014-05-04: 38 mg via INTRAVENOUS
  Filled 2014-05-04: qty 19

## 2014-05-04 MED ORDER — INFLUENZA VAC SPLIT QUAD 0.5 ML IM SUSY
0.5000 mL | PREFILLED_SYRINGE | Freq: Once | INTRAMUSCULAR | Status: AC
  Administered 2014-05-04: 0.5 mL via INTRAMUSCULAR
  Filled 2014-05-04: qty 0.5

## 2014-05-04 MED ORDER — SODIUM CHLORIDE 0.9 % IV SOLN
Freq: Once | INTRAVENOUS | Status: AC
  Administered 2014-05-04: 14:00:00 via INTRAVENOUS

## 2014-05-04 MED ORDER — DEXAMETHASONE SODIUM PHOSPHATE 10 MG/ML IJ SOLN
INTRAMUSCULAR | Status: AC
  Filled 2014-05-04: qty 1

## 2014-05-04 MED ORDER — INFLUENZA VAC SPLIT QUAD 0.5 ML IM SUSY
0.5000 mL | PREFILLED_SYRINGE | INTRAMUSCULAR | Status: DC
  Filled 2014-05-04: qty 0.5

## 2014-05-04 MED ORDER — DEXAMETHASONE SODIUM PHOSPHATE 10 MG/ML IJ SOLN
10.0000 mg | Freq: Once | INTRAMUSCULAR | Status: AC
  Administered 2014-05-04: 10 mg via INTRAVENOUS

## 2014-05-04 MED ORDER — ONDANSETRON 8 MG/NS 50 ML IVPB
INTRAVENOUS | Status: AC
  Filled 2014-05-04: qty 8

## 2014-05-04 MED ORDER — ONDANSETRON 8 MG/50ML IVPB (CHCC)
8.0000 mg | Freq: Once | INTRAVENOUS | Status: AC
  Administered 2014-05-04: 8 mg via INTRAVENOUS

## 2014-05-04 MED ORDER — SODIUM CHLORIDE 0.9 % IV SOLN: Freq: Once | INTRAVENOUS | Status: DC

## 2014-05-04 NOTE — Assessment & Plan Note (Signed)
Clinically, he has no symptoms. Continue surveillance program under urologist

## 2014-05-04 NOTE — Assessment & Plan Note (Signed)
This is likely due to recent treatment. The patient denies recent history of bleeding such as epistaxis, hematuria or hematochezia. He is asymptomatic from the anemia. I will observe for now.  He does not require transfusion now. I will continue the chemotherapy at current dose without dosage adjustment.  If the anemia gets progressive worse in the future, I might have to delay his treatment or adjust the chemotherapy dose.  

## 2014-05-04 NOTE — Assessment & Plan Note (Signed)
He tolerates treatment well. His last protein electrophoresis to show nondetectable M spike. We will continue the same treatment with dose adjustment.

## 2014-05-04 NOTE — Progress Notes (Signed)
[facility] progress notes  Patient Care Team: Neena Rhymes, MD as PCP - General Linton Rump, MD (Ophthalmology) Bernestine Amass, MD (Urology) Marily Memos, MD (Orthopedic Surgery) Jeanann Lewandowsky, MD as Consulting Physician (Hematology and Oncology)  CHIEF COMPLAINTS/PURPOSE OF VISIT:  Multiple myeloma, seen prior to cycle 7 of treatment  HISTORY OF PRESENTING ILLNESS:  Trevor Michael 73 y.o. male was transferred to my care after his prior physician has left.  I reviewed the patient's records extensive and collaborated the history with the patient. Summary of his history is as follows:   Multiple myeloma   05/21/2012 Initial Diagnosis Multiple myeloma, ISS stage III.   06/19/2012 Imaging Lytic lesions in the calvaria and right hemipelvis and a possible lesion in the right humerus.    06/20/2012 Imaging CT scan of the right humerus showed a 2.3 cm lucent lesion with probable mild endosteal thinning anteriorly near the bicipital groove.  several small lytic lesions in humeral head, scapula, glenoid, and proximal radius.     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 Plasma cells are lambda ligh chain restricted.    06/24/2012 Pathology Results Cytogenetics revealed the presence of normal male chormosomes with no observable clonal abnormalities.  Single cell with additional chromosome material on 3q, loss of 13 .  DNA, gain of chromosome 11.    07/01/2012 - 01/02/2013 Chemotherapy Velcade, cytoxan and decadron. doses were increased on 11/06/2012.  Neulasta was started on 08/16/2012 because of leukopenia.    07/10/2012 - 07/28/2012 Radiation Therapy He received XRT to the right humerus 25 Gy in 10 fractions.    12/04/2012 Bone Marrow Biopsy Hypercllular for age with trilineage hematopoiesis in addition to increased number of atypical plasma cells estimated at 24% of  all cells in the aspirate. Lamda light chain restriction. rare circulating plasma cells.   12/04/2012 Pathology Results Cytogenetics revealed the presence of 2 clonal cell lines. first line with chromosal normal (25%).  2nd line was abnormal, missing a Y chromosome. 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 FISH showed gain of 11.    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  S/p melphalan 140 mg /m2 on 01/27/2013.    06/27/2013 Tumor Marker 24-hour urine yielded 17.1 gram of protein of which 16.9 grams was free lambda light chains.  Beta 2 microglobulin was 5.8. Albumin 3.2. IgG 6,320.    07/07/2013 - 09/01/2013 Chemotherapy Maintenance velcade started.   09/21/2013 Progression Counts decreasing; WBC followed by Plts. Referred to [facility] for recommendations for next therapy.    10/12/2013 Bone Marrow Biopsy Done at [facility].  Per care everywhere, PLASMA CELLS COMPRISE 80% OF A PACKED BONE MARROW.    10/22/2013 Tumor Marker Labs done at [facility].  spep m-spike 0.08, FLC lambda 379.00, ratio 0. 24 hour urine, m-spike 16632, IFE positive for monoclonal lambda.   10/27/2013 -  Chemotherapy Started salvage chemotherapy with carlfozomib plus dexamethasone plus revlimid.    11/09/2013 - 11/17/2013 Hospital Admission Admitted with fever.  Started on broad spectrum antibiotics. Had severe anemia and was give hemoglobin with questionable tranfusion reaction. 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 Discharged off antibiotics.    01/21/2014 -  Chemotherapy Zometa 3.5 mg monthly started.    03/02/2014 Treatment Plan Change Decreased Revlimide to 5 mg (down from 10 mg) daily for 21 days on and 7 days off every 28 days  due persistent leukopenia.    05/04/2014 Remission He has achieved good response to treatment. I recommend dexamethasone taper starting at 20 mg weekly.   He is doing well. He denies side effects from treatment. He has intermittent back pain but  this is chronic. He denies recent infection. He admitted that his blood pressure is high today likely due to white coat hypertension. Blood pressure monitoring at home was normal. MEDICAL HISTORY:  Past Medical History  Diagnosis Date  . Other and unspecified hyperlipidemia   . Essential hypertension, benign   . BPH (benign prostatic hyperplasia)   . Early stage glaucoma   . Heart murmur     "since my teens" (06/18/2012)  . Cervical stenosis of spine   . GERD (gastroesophageal reflux disease)   . Bone cancer     multiple bone lesion/right humerus  . Spinal stenosis   . Chronic kidney disease     renal insufficiency  . Arthritis   . DDD (degenerative disc disease)     c-5=-c6, and spondylosis  . Cancer 05/2012    IgA lambda multiple myeloma  . Anorexia 06/25/12    1 month hx   . Diverticulitis   . History of chemotherapy 07/01/12    Velcade,Cytoxin and decadron  . Prostate cancer 07/28/13    Gleason 7, volume 50 gm  . Murmur   . History of radiation therapy 07/10/12- 07/28/12    right humerus, 25 gy in 10 fractions, Dr Sondra Come    SURGICAL HISTORY: Past Surgical History  Procedure Laterality Date  . Lipoma excision  07/1986    left shoulder/scapula  . Esophagogastroduodenoscopy  06/20/2012    Procedure: ESOPHAGOGASTRODUODENOSCOPY (EGD);  Surgeon: Beryle Beams, MD;  Location: [facility] ENDOSCOPY;  Service: Endoscopy;  Laterality: N/A;  . Prostate biopsy  October 19, 2006    benign  . Prostate biopsy  07/28/13    Gleason 7  . Shoulder surgery      SOCIAL HISTORY: History   Social History  . Marital Status: Married    Spouse Name: N/A    Number of Children: N/A  . Years of Education: 4   Occupational History  . businessman     semi-retired   Social History Main Topics  . Smoking status: Never Smoker   . Smokeless tobacco: Never Used  . Alcohol Use: No  . Drug Use: No  . Sexual Activity: Not Currently    Partners: Female   Other Topics Concern  . Not on file   Social  History Narrative   [employer] BS, MBA [employer].  Married 1961/10/19. 1 daughter '64-deceased 04/30/2023 sepsis.  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 Marriage in good health. Playing golf. Doing well overwell.     FAMILY HISTORY: Family History  Problem Relation Age of Onset  . Benign prostatic hyperplasia Father   . Pneumonia Father   . Coronary artery disease Father     PTCA  . Heart disease Brother     CABG, CAD,  . Benign prostatic hyperplasia Brother     ALLERGIES:  has No Known Allergies.  MEDICATIONS:  Current Outpatient Prescriptions  Medication Sig Dispense Refill  . acetaminophen (TYLENOL) 500 MG tablet Take 1,000 mg by mouth every 6 (six) hours as needed. For pain      . acyclovir (ZOVIRAX) 400 MG tablet Take 1 tablet (  400 mg total) by mouth 2 (two) times daily.  60 tablet  5  . allopurinol (ZYLOPRIM) 100 MG tablet Take 100 mg by mouth daily.      Marland Kitchen aspirin EC 81 MG tablet Take 1 tablet (81 mg total) by mouth daily.      Marland Kitchen atenolol-chlorthalidone (TENORETIC) 50-25 MG per tablet Take 1 tablet by mouth daily.       . bimatoprost (LUMIGAN) 0.03 % ophthalmic drops Place 1 drop into both eyes at bedtime.        . brimonidine-timolol (COMBIGAN) 0.2-0.5 % ophthalmic solution Place 1 drop into both eyes 2 (two) times daily.        Marland Kitchen dexamethasone (DECADRON) 4 MG tablet Takes 10 tablet (69m) once a week  40 tablet  2  . dexamethasone (DECADRON) 4 MG tablet TAKE 10 TABS ON DAYS 1,8,15,22 OF CHEMO X 4 CYCLES THEN TAKE 5 TABS ON DAY 1,8,15 &22 OF CHEMO  40 tablet  4  . esomeprazole (NEXIUM) 40 MG capsule Take 40 mg by mouth daily as needed (acid reflux).       . feeding supplement, RESOURCE BREEZE, (RESOURCE BREEZE) LIQD Take 1 Container by mouth daily after supper.  30 Container  0  .  folic acid (FOLVITE) 1 MG tablet TAKE 1 TABLET (1 MG TOTAL) BY MOUTH DAILY.  30 tablet  3  . KLOR-CON M20 20 MEQ tablet TAKE 1 TABLET (20 MEQ TOTAL) BY MOUTH 2 (TWO) TIMES DAILY.  60 tablet  3  . lenalidomide (REVLIMID) 5 MG capsule Take one tablet (539m daily for 21 days and then off 7 days every 28 days.  21 capsule  0  . loperamide (IMODIUM) 2 MG capsule Take 2 mg by mouth as needed for diarrhea or loose stools.      . Marland Kitchenoratadine (CLARITIN) 10 MG tablet Take 10 mg by mouth daily as needed for allergies.      . Marland Kitchenrochlorperazine (COMPAZINE) 10 MG tablet Take 10 mg by mouth every 6 (six) hours as needed for nausea or vomiting.      . senna (SENOKOT) 8.6 MG TABS tablet Take 1-2 tablets by mouth daily as needed for mild constipation.       . simvastatin (ZOCOR) 20 MG tablet Take 20 mg by mouth at bedtime.      . tamsulosin (FLOMAX) 0.4 MG CAPS capsule TAKE 1 CAPSULE (0.4 MG TOTAL) BY MOUTH DAILY.  30 capsule  3   No current facility-administered medications for this visit.   Facility-Administered Medications Ordered in Other Visits  Medication Dose Route Frequency Provider Last Rate Last Dose  . 0.9 %  sodium chloride infusion   Intravenous Once NiHeath LarkMD      . [SDerrill MemoN 05/05/2014] Influenza vac split quadrivalent PF (FLUARIX) injection 0.5 mL  0.5 mL Intramuscular Tomorrow-1000 NiHeath LarkMD        REVIEW OF SYSTEMS:   Constitutional: Denies fevers, chills or abnormal night sweats Eyes: Denies blurriness of vision, double vision or watery eyes Ears, nose, mouth, throat, and face: Denies mucositis or sore throat Respiratory: Denies cough, dyspnea or wheezes Cardiovascular: Denies palpitation, chest discomfort or lower extremity swelling Gastrointestinal:  Denies nausea, heartburn or change in bowel habits Skin: Denies abnormal skin rashes Lymphatics: Denies new lymphadenopathy or easy bruising Neurological:Denies numbness, tingling or new weaknesses Behavioral/Psych: Mood is stable,  no new changes  All other systems were reviewed with the patient and are negative.  PHYSICAL EXAMINATION: ECOG PERFORMANCE STATUS: 0 -  Asymptomatic  Filed Vitals:   05/04/14 1251  BP: 171/71  Pulse: 70  Temp: 98.2 F (36.8 C)  Resp: 20   Filed Weights   05/04/14 1251  Weight: 164 lb 1.6 oz (74.435 kg)    GENERAL:alert, no distress and comfortable SKIN: skin color, texture, turgor are normal, no rashes or significant lesions EYES: normal, conjunctiva are pink and non-injected, sclera clear OROPHARYNX:no exudate, normal lips, buccal mucosa, and tongue  NECK: supple, thyroid normal size, non-tender, without nodularity LYMPH:  no palpable lymphadenopathy in the cervical, axillary or inguinal LUNGS: clear to auscultation and percussion with normal breathing effort HEART: regular rate & rhythm and no murmurs without lower extremity edema ABDOMEN:abdomen soft, non-tender and normal bowel sounds Musculoskeletal:no cyanosis of digits and no clubbing  PSYCH: alert & oriented x 3 with fluent speech NEURO: no focal motor/sensory deficits  LABORATORY DATA:  I have reviewed the data as listed Lab Results  Component Value Date   WBC 4.4 05/04/2014   HGB 12.9* 05/04/2014   HCT 38.8 05/04/2014   MCV 99.3* 05/04/2014   PLT 179 05/04/2014    Recent Labs  11/09/13 2310  11/15/13 0340 11/16/13 0650 11/17/13 0650  03/02/14 0929  04/06/14 0827 04/13/14 1157 04/20/14 1324 05/04/14 1228  NA  --   < > 137 136* 138  < > 143  < > 143 143 141 141  K  --   < > 3.6* 4.2 4.1  < > 3.7  < > 3.4* 3.4* 3.5 3.7  CL  --   < > 96 101 101  --   --   --   --   --   --   --   CO2  --   < > 25 20 23   < > 32*  < > 28 30* 25 25  GLUCOSE  --   < > 114* 87 95  < > 72  < > 80 80 109 104  BUN  --   < > 51* 49* 44*  < > 19.6  < > 17.8 20.6 16.5 17.6  CREATININE  --   < > 3.40* 3.29* 3.22*  < > 1.0  < > 1.0 1.0 0.9 0.9  CALCIUM  --   < > 6.3* 6.6* 6.8*  < > 8.9  < > 9.2 9.1 9.0 9.1  GFRNONAA  --   < > 17* 17*  18*  --   --   --   --   --   --   --   GFRAA  --   < > 19* 20* 21*  --   --   --   --   --   --   --   PROT 5.9*  < >  --  5.6*  --   < > 6.0*  --  6.2*  --   --  6.4  ALBUMIN 3.5  < > 2.6* 2.6* 2.8*  < > 3.7  --  3.8  --   --  3.7  AST 42*  < >  --  23  --   < > 15  --  18  --   --  17  ALT 14  < >  --  11  --   < > 18  --  14  --   --  21  ALKPHOS 98  < >  --  78  --   < > 68  --  55  --   --  60  BILITOT 2.8*  < >  --  0.8  --   < > 0.95  --  0.92  --   --  0.90  BILIDIR 0.3  --   --  <0.2  --   --   --   --   --   --   --   --   IBILI 2.5*  --   --  NOT CALCULATED  --   --   --   --   --   --   --   --   < > = values in this interval not displayed.  ASSESSMENT & PLAN:  Multiple myeloma He tolerates treatment well. His last protein electrophoresis to show nondetectable M spike. We will continue the same treatment with dose adjustment.  Malignant neoplasm of prostate Clinically, he has no symptoms. Continue surveillance program under urologist  Anemia in neoplastic disease This is likely due to recent treatment. The patient denies recent history of bleeding such as epistaxis, hematuria or hematochezia. He is asymptomatic from the anemia. I will observe for now.  He does not require transfusion now. I will continue the chemotherapy at current dose without dosage adjustment.  If the anemia gets progressive worse in the future, I might have to delay his treatment or adjust the chemotherapy dose.   Metastasis to bone He will continue Zometa monthly. I recommend vitamin D supplement. He has regular dentist evaluation with no signs of osteonecrosis of the jaw.  Thrombocytopenia due to drugs This is likely due to recent treatment, improved today do to his recent week off Revlimid. The patient denies recent history of bleeding such as epistaxis, hematuria or hematochezia. He is asymptomatic from the low platelet count. I will observe for now.  he does not require transfusion now. I will  continue the chemotherapy at current dose without dosage adjustment.  If the thrombocytopenia gets progressive worse in the future, I might have to delay his treatment or adjust the chemotherapy dose. He will continue aspirin therapy to prevent DVT.  Preventive measure We discussed the importance of preventive care and reviewed the vaccination programs. He does not have any prior allergic reactions to influenza vaccination. He agrees to proceed with influenza vaccination today and we will administer it today at the clinic.   I recommend close blood pressure monitoring at home. I also recommend initiation of dexamethasone taper and to start vitamin D supplement.  Orders Placed This Encounter  Procedures  . CBC with Differential    Standing Status: Standing     Number of Occurrences: 9     Standing Expiration Date: 05/05/2015  . Comprehensive metabolic panel    Standing Status: Standing     Number of Occurrences: 9     Standing Expiration Date: 05/05/2015    All questions were answered. The patient knows to call the clinic with any problems, questions or concerns. I spent 30 minutes counseling the patient face to face. The total time spent in the appointment was 40 minutes and more than 50% was on counseling.     [facility], [place], MD 05/04/2014 4:31 PM

## 2014-05-04 NOTE — Patient Instructions (Signed)
[place] Discharge Instructions for Patients Receiving Chemotherapy  Today you received the following chemotherapy agents :  Kyprolis,  Flu injection.  To help prevent nausea and vomiting after your treatment, we encourage you to take your nausea medication as prescribed by your physician.   If you develop nausea and vomiting that is not controlled by your nausea medication, call the clinic.   BELOW ARE SYMPTOMS THAT SHOULD BE REPORTED IMMEDIATELY:  *FEVER GREATER THAN 100.5 F  *CHILLS WITH OR WITHOUT FEVER  NAUSEA AND VOMITING THAT IS NOT CONTROLLED WITH YOUR NAUSEA MEDICATION  *UNUSUAL SHORTNESS OF BREATH  *UNUSUAL BRUISING OR BLEEDING  TENDERNESS IN MOUTH AND THROAT WITH OR WITHOUT PRESENCE OF ULCERS  *URINARY PROBLEMS  *BOWEL PROBLEMS  UNUSUAL RASH Items with * indicate a potential emergency and should be followed up as soon as possible.  Feel free to call the clinic you have any questions or concerns. The clinic phone number is [phone].

## 2014-05-04 NOTE — Telephone Encounter (Signed)
Pt confirmed labs/ov per 09/15 POF, sent msg to add chemo, gave pt AVS....KJ °

## 2014-05-04 NOTE — Assessment & Plan Note (Signed)
He will continue Zometa monthly. I recommend vitamin D supplement. He has regular dentist evaluation with no signs of osteonecrosis of the jaw.

## 2014-05-04 NOTE — Assessment & Plan Note (Signed)
We discussed the importance of preventive care and reviewed the vaccination programs. He does not have any prior allergic reactions to influenza vaccination. He agrees to proceed with influenza vaccination today and we will administer it today at the clinic.  

## 2014-05-04 NOTE — Assessment & Plan Note (Signed)
This is likely due to recent treatment, improved today do to his recent week off Revlimid. The patient denies recent history of bleeding such as epistaxis, hematuria or hematochezia. He is asymptomatic from the low platelet count. I will observe for now.  he does not require transfusion now. I will continue the chemotherapy at current dose without dosage adjustment.  If the thrombocytopenia gets progressive worse in the future, I might have to delay his treatment or adjust the chemotherapy dose. He will continue aspirin therapy to prevent DVT.

## 2014-05-05 ENCOUNTER — Ambulatory Visit (HOSPITAL_BASED_OUTPATIENT_CLINIC_OR_DEPARTMENT_OTHER): Payer: Medicare Other

## 2014-05-05 DIAGNOSIS — Z5112 Encounter for antineoplastic immunotherapy: Secondary | ICD-10-CM

## 2014-05-05 DIAGNOSIS — C9000 Multiple myeloma not having achieved remission: Secondary | ICD-10-CM

## 2014-05-05 MED ORDER — ONDANSETRON 8 MG/50ML IVPB (CHCC)
8.0000 mg | Freq: Once | INTRAVENOUS | Status: AC
  Administered 2014-05-05: 8 mg via INTRAVENOUS

## 2014-05-05 MED ORDER — SODIUM CHLORIDE 0.9 % IV SOLN
Freq: Once | INTRAVENOUS | Status: AC
  Administered 2014-05-05: 14:00:00 via INTRAVENOUS

## 2014-05-05 MED ORDER — CARFILZOMIB CHEMO INJECTION 60 MG
20.0000 mg/m2 | Freq: Once | INTRAVENOUS | Status: AC
  Administered 2014-05-05: 38 mg via INTRAVENOUS
  Filled 2014-05-05: qty 19

## 2014-05-05 MED ORDER — DEXAMETHASONE SODIUM PHOSPHATE 10 MG/ML IJ SOLN
10.0000 mg | Freq: Once | INTRAMUSCULAR | Status: AC
  Administered 2014-05-05: 10 mg via INTRAVENOUS

## 2014-05-05 MED ORDER — DEXAMETHASONE SODIUM PHOSPHATE 10 MG/ML IJ SOLN
INTRAMUSCULAR | Status: AC
Start: 2014-05-05 — End: 2014-05-05
  Filled 2014-05-05: qty 1

## 2014-05-05 MED ORDER — ONDANSETRON 8 MG/NS 50 ML IVPB
INTRAVENOUS | Status: AC
  Filled 2014-05-05: qty 8

## 2014-05-05 NOTE — Patient Instructions (Signed)
[facility] Discharge Instructions for Patients Receiving Chemotherapy  Today you received the following chemotherapy agents: Kyprolis  To help prevent nausea and vomiting after your treatment, we encourage you to take your nausea medication as prescribed by your physician.   If you develop nausea and vomiting that is not controlled by your nausea medication, call the clinic.   BELOW ARE SYMPTOMS THAT SHOULD BE REPORTED IMMEDIATELY:  *FEVER GREATER THAN 100.5 F  *CHILLS WITH OR WITHOUT FEVER  NAUSEA AND VOMITING THAT IS NOT CONTROLLED WITH YOUR NAUSEA MEDICATION  *UNUSUAL SHORTNESS OF BREATH  *UNUSUAL BRUISING OR BLEEDING  TENDERNESS IN MOUTH AND THROAT WITH OR WITHOUT PRESENCE OF ULCERS  *URINARY PROBLEMS  *BOWEL PROBLEMS  UNUSUAL RASH Items with * indicate a potential emergency and should be followed up as soon as possible.  Feel free to call the clinic you have any questions or concerns. The clinic phone number is [phone].    

## 2014-05-06 ENCOUNTER — Telehealth: Payer: Self-pay | Admitting: *Deleted

## 2014-05-06 DIAGNOSIS — C9000 Multiple myeloma not having achieved remission: Secondary | ICD-10-CM

## 2014-05-06 NOTE — Telephone Encounter (Signed)
Message copied by Cathlean Cower on Thu May 06, 2014 11:13 AM ------      Message from: [facility] [place]      Created: Wed May 05, 2014  4:53 PM      Regarding: FW: PAC?       Please order a port      ----- Message -----         From: Cathlean Cower, RN         Sent: 05/05/2014   2:38 PM           To: Heath Lark, MD      Subject: PAC?                                                     Infusion room nurses report pt has become difficult stick.  Had to be stuck a few times on his last few visits.  He is open to getting a PAC if you think its appropriate.        ------

## 2014-05-06 NOTE — Telephone Encounter (Signed)
Discussed PAC w/ pt and he is agreeable.  Informed him of order placed and to expect call from IR to schedule.  He verbalized understanding.

## 2014-05-07 LAB — SPEP & IFE WITH QIG
ALBUMIN ELP: 65.3 % (ref 55.8–66.1)
ALPHA-1-GLOBULIN: 5.2 % — AB (ref 2.9–4.9)
Alpha-2-Globulin: 12.5 % — ABNORMAL HIGH (ref 7.1–11.8)
Beta 2: 4.9 % (ref 3.2–6.5)
Beta Globulin: 6.5 % (ref 4.7–7.2)
Gamma Globulin: 5.6 % — ABNORMAL LOW (ref 11.1–18.8)
IGM, SERUM: 6 mg/dL — AB (ref 41–251)
IgA: 27 mg/dL — ABNORMAL LOW (ref 68–379)
IgG (Immunoglobin G), Serum: 323 mg/dL — ABNORMAL LOW (ref 650–1600)
M-Spike, %: 0.05 g/dL
Total Protein, Serum Electrophoresis: 5.9 g/dL — ABNORMAL LOW (ref 6.0–8.3)

## 2014-05-07 LAB — KAPPA/LAMBDA LIGHT CHAINS
Kappa free light chain: 0.03 mg/dL — ABNORMAL LOW (ref 0.33–1.94)
Kappa:Lambda Ratio: 0 — ABNORMAL LOW (ref 0.26–1.65)
Lambda Free Lght Chn: 46.7 mg/dL — ABNORMAL HIGH (ref 0.57–2.63)

## 2014-05-07 LAB — BETA 2 MICROGLOBULIN, SERUM: Beta-2 Microglobulin: 3.83 mg/L — ABNORMAL HIGH (ref <=2.51)

## 2014-05-11 ENCOUNTER — Other Ambulatory Visit: Payer: Medicare Other

## 2014-05-11 ENCOUNTER — Ambulatory Visit: Payer: Medicare Other

## 2014-05-11 ENCOUNTER — Telehealth: Payer: Self-pay | Admitting: *Deleted

## 2014-05-11 ENCOUNTER — Ambulatory Visit (HOSPITAL_BASED_OUTPATIENT_CLINIC_OR_DEPARTMENT_OTHER): Payer: Medicare Other

## 2014-05-11 ENCOUNTER — Other Ambulatory Visit (HOSPITAL_BASED_OUTPATIENT_CLINIC_OR_DEPARTMENT_OTHER): Payer: Medicare Other

## 2014-05-11 ENCOUNTER — Telehealth: Payer: Self-pay | Admitting: Hematology and Oncology

## 2014-05-11 ENCOUNTER — Ambulatory Visit: Payer: Medicare Other | Admitting: Hematology and Oncology

## 2014-05-11 VITALS — BP 153/74 | HR 76 | Temp 98.2°F | Resp 18

## 2014-05-11 DIAGNOSIS — C9000 Multiple myeloma not having achieved remission: Secondary | ICD-10-CM

## 2014-05-11 DIAGNOSIS — Z23 Encounter for immunization: Secondary | ICD-10-CM

## 2014-05-11 DIAGNOSIS — Z5112 Encounter for antineoplastic immunotherapy: Secondary | ICD-10-CM

## 2014-05-11 LAB — CBC WITH DIFFERENTIAL/PLATELET
BASO%: 0.4 % (ref 0.0–2.0)
Basophils Absolute: 0 10*3/uL (ref 0.0–0.1)
EOS%: 3.7 % (ref 0.0–7.0)
Eosinophils Absolute: 0.2 10*3/uL (ref 0.0–0.5)
HCT: 38.1 % — ABNORMAL LOW (ref 38.4–49.9)
HGB: 13 g/dL (ref 13.0–17.1)
LYMPH%: 22.8 % (ref 14.0–49.0)
MCH: 33.4 pg (ref 27.2–33.4)
MCHC: 34.1 g/dL (ref 32.0–36.0)
MCV: 97.9 fL (ref 79.3–98.0)
MONO#: 0.7 10*3/uL (ref 0.1–0.9)
MONO%: 12.9 % (ref 0.0–14.0)
NEUT#: 3.1 10*3/uL (ref 1.5–6.5)
NEUT%: 60.2 % (ref 39.0–75.0)
NRBC: 0 % (ref 0–0)
Platelets: 117 10*3/uL — ABNORMAL LOW (ref 140–400)
RBC: 3.89 10*6/uL — AB (ref 4.20–5.82)
RDW: 15 % — AB (ref 11.0–14.6)
WBC: 5.2 10*3/uL (ref 4.0–10.3)
lymph#: 1.2 10*3/uL (ref 0.9–3.3)

## 2014-05-11 LAB — COMPREHENSIVE METABOLIC PANEL (CC13)
ALBUMIN: 3.6 g/dL (ref 3.5–5.0)
ALT: 16 U/L (ref 0–55)
AST: 19 U/L (ref 5–34)
Alkaline Phosphatase: 60 U/L (ref 40–150)
Anion Gap: 8 mEq/L (ref 3–11)
BUN: 16.1 mg/dL (ref 7.0–26.0)
CALCIUM: 9.2 mg/dL (ref 8.4–10.4)
CHLORIDE: 107 meq/L (ref 98–109)
CO2: 29 mEq/L (ref 22–29)
Creatinine: 1.1 mg/dL (ref 0.7–1.3)
Glucose: 98 mg/dl (ref 70–140)
Potassium: 3.6 mEq/L (ref 3.5–5.1)
SODIUM: 144 meq/L (ref 136–145)
Total Bilirubin: 0.85 mg/dL (ref 0.20–1.20)
Total Protein: 6.2 g/dL — ABNORMAL LOW (ref 6.4–8.3)

## 2014-05-11 LAB — TECHNOLOGIST REVIEW

## 2014-05-11 MED ORDER — ONDANSETRON 8 MG/50ML IVPB (CHCC)
8.0000 mg | Freq: Once | INTRAVENOUS | Status: AC
  Administered 2014-05-11: 8 mg via INTRAVENOUS

## 2014-05-11 MED ORDER — ONDANSETRON 8 MG/NS 50 ML IVPB
INTRAVENOUS | Status: AC
  Filled 2014-05-11: qty 8

## 2014-05-11 MED ORDER — DEXAMETHASONE SODIUM PHOSPHATE 10 MG/ML IJ SOLN
INTRAMUSCULAR | Status: AC
  Filled 2014-05-11: qty 1

## 2014-05-11 MED ORDER — DEXAMETHASONE SODIUM PHOSPHATE 10 MG/ML IJ SOLN
10.0000 mg | Freq: Once | INTRAMUSCULAR | Status: AC
  Administered 2014-05-11: 10 mg via INTRAVENOUS

## 2014-05-11 MED ORDER — DEXTROSE 5 % IV SOLN
20.0000 mg/m2 | Freq: Once | INTRAVENOUS | Status: AC
  Administered 2014-05-11: 38 mg via INTRAVENOUS
  Filled 2014-05-11: qty 19

## 2014-05-11 MED ORDER — SODIUM CHLORIDE 0.9 % IV SOLN
Freq: Once | INTRAVENOUS | Status: AC
  Administered 2014-05-11: 16:00:00 via INTRAVENOUS

## 2014-05-11 NOTE — Patient Instructions (Signed)
[facility] Discharge Instructions for Patients Receiving Chemotherapy  Today you received the following chemotherapy agents Kyprolis To help prevent nausea and vomiting after your treatment, we encourage you to take your nausea medication as prescribed.  If you develop nausea and vomiting that is not controlled by your nausea medication, call the clinic.   BELOW ARE SYMPTOMS THAT SHOULD BE REPORTED IMMEDIATELY:  *FEVER GREATER THAN 100.5 F  *CHILLS WITH OR WITHOUT FEVER  NAUSEA AND VOMITING THAT IS NOT CONTROLLED WITH YOUR NAUSEA MEDICATION  *UNUSUAL SHORTNESS OF BREATH  *UNUSUAL BRUISING OR BLEEDING  TENDERNESS IN MOUTH AND THROAT WITH OR WITHOUT PRESENCE OF ULCERS  *URINARY PROBLEMS  *BOWEL PROBLEMS  UNUSUAL RASH Items with * indicate a potential emergency and should be followed up as soon as possible.  Feel free to call the clinic you have any questions or concerns. The clinic phone number is [phone].    

## 2014-05-11 NOTE — Telephone Encounter (Signed)
, °

## 2014-05-11 NOTE — Telephone Encounter (Signed)
Per scheduler I have added appts

## 2014-05-12 ENCOUNTER — Ambulatory Visit: Payer: Medicare Other

## 2014-05-12 ENCOUNTER — Ambulatory Visit (HOSPITAL_BASED_OUTPATIENT_CLINIC_OR_DEPARTMENT_OTHER): Payer: Medicare Other

## 2014-05-12 VITALS — BP 135/67 | HR 74 | Temp 98.2°F | Resp 18

## 2014-05-12 DIAGNOSIS — C9000 Multiple myeloma not having achieved remission: Secondary | ICD-10-CM

## 2014-05-12 DIAGNOSIS — Z5112 Encounter for antineoplastic immunotherapy: Secondary | ICD-10-CM

## 2014-05-12 MED ORDER — ONDANSETRON 8 MG/50ML IVPB (CHCC)
8.0000 mg | Freq: Once | INTRAVENOUS | Status: AC
  Administered 2014-05-12: 8 mg via INTRAVENOUS

## 2014-05-12 MED ORDER — DEXAMETHASONE SODIUM PHOSPHATE 10 MG/ML IJ SOLN
INTRAMUSCULAR | Status: AC
  Filled 2014-05-12: qty 1

## 2014-05-12 MED ORDER — DEXAMETHASONE SODIUM PHOSPHATE 10 MG/ML IJ SOLN
10.0000 mg | Freq: Once | INTRAMUSCULAR | Status: AC
  Administered 2014-05-12: 10 mg via INTRAVENOUS

## 2014-05-12 MED ORDER — DEXTROSE 5 % IV SOLN
20.0000 mg/m2 | Freq: Once | INTRAVENOUS | Status: AC
  Administered 2014-05-12: 38 mg via INTRAVENOUS
  Filled 2014-05-12: qty 19

## 2014-05-12 MED ORDER — ONDANSETRON 8 MG/NS 50 ML IVPB
INTRAVENOUS | Status: AC
  Filled 2014-05-12: qty 8

## 2014-05-12 MED ORDER — SODIUM CHLORIDE 0.9 % IV SOLN
Freq: Once | INTRAVENOUS | Status: AC
  Administered 2014-05-12: 11:00:00 via INTRAVENOUS

## 2014-05-18 ENCOUNTER — Ambulatory Visit (HOSPITAL_BASED_OUTPATIENT_CLINIC_OR_DEPARTMENT_OTHER): Payer: Medicare Other

## 2014-05-18 ENCOUNTER — Other Ambulatory Visit: Payer: Self-pay | Admitting: Hematology and Oncology

## 2014-05-18 ENCOUNTER — Other Ambulatory Visit (HOSPITAL_BASED_OUTPATIENT_CLINIC_OR_DEPARTMENT_OTHER): Payer: Medicare Other

## 2014-05-18 VITALS — BP 146/62 | HR 68 | Temp 97.3°F | Resp 19

## 2014-05-18 DIAGNOSIS — Z23 Encounter for immunization: Secondary | ICD-10-CM

## 2014-05-18 DIAGNOSIS — C9000 Multiple myeloma not having achieved remission: Secondary | ICD-10-CM

## 2014-05-18 DIAGNOSIS — Z5112 Encounter for antineoplastic immunotherapy: Secondary | ICD-10-CM

## 2014-05-18 LAB — COMPREHENSIVE METABOLIC PANEL (CC13)
ALK PHOS: 62 U/L (ref 40–150)
ALT: 18 U/L (ref 0–55)
AST: 18 U/L (ref 5–34)
Albumin: 3.5 g/dL (ref 3.5–5.0)
Anion Gap: 8 mEq/L (ref 3–11)
BUN: 14.1 mg/dL (ref 7.0–26.0)
CALCIUM: 9.4 mg/dL (ref 8.4–10.4)
CHLORIDE: 107 meq/L (ref 98–109)
CO2: 28 mEq/L (ref 22–29)
CREATININE: 1.2 mg/dL (ref 0.7–1.3)
GLUCOSE: 92 mg/dL (ref 70–140)
POTASSIUM: 3.7 meq/L (ref 3.5–5.1)
Sodium: 142 mEq/L (ref 136–145)
Total Bilirubin: 0.99 mg/dL (ref 0.20–1.20)
Total Protein: 6 g/dL — ABNORMAL LOW (ref 6.4–8.3)

## 2014-05-18 LAB — CBC WITH DIFFERENTIAL/PLATELET
BASO%: 0.4 % (ref 0.0–2.0)
BASOS ABS: 0 10*3/uL (ref 0.0–0.1)
EOS ABS: 0.2 10*3/uL (ref 0.0–0.5)
EOS%: 3.4 % (ref 0.0–7.0)
HCT: 37.3 % — ABNORMAL LOW (ref 38.4–49.9)
HEMOGLOBIN: 12.3 g/dL — AB (ref 13.0–17.1)
LYMPH%: 12.7 % — ABNORMAL LOW (ref 14.0–49.0)
MCH: 33 pg (ref 27.2–33.4)
MCHC: 32.9 g/dL (ref 32.0–36.0)
MCV: 100.1 fL — ABNORMAL HIGH (ref 79.3–98.0)
MONO#: 1.8 10*3/uL — ABNORMAL HIGH (ref 0.1–0.9)
MONO%: 28 % — AB (ref 0.0–14.0)
NEUT%: 55.5 % (ref 39.0–75.0)
NEUTROS ABS: 3.5 10*3/uL (ref 1.5–6.5)
Platelets: 139 10*3/uL — ABNORMAL LOW (ref 140–400)
RBC: 3.73 10*6/uL — ABNORMAL LOW (ref 4.20–5.82)
RDW: 15.7 % — ABNORMAL HIGH (ref 11.0–14.6)
WBC: 6.3 10*3/uL (ref 4.0–10.3)
lymph#: 0.8 10*3/uL — ABNORMAL LOW (ref 0.9–3.3)

## 2014-05-18 LAB — TECHNOLOGIST REVIEW

## 2014-05-18 MED ORDER — DEXAMETHASONE SODIUM PHOSPHATE 10 MG/ML IJ SOLN
10.0000 mg | Freq: Once | INTRAMUSCULAR | Status: AC
  Administered 2014-05-18: 10 mg via INTRAVENOUS

## 2014-05-18 MED ORDER — DEXTROSE 5 % IV SOLN
20.0000 mg/m2 | Freq: Once | INTRAVENOUS | Status: AC
  Administered 2014-05-18: 38 mg via INTRAVENOUS
  Filled 2014-05-18: qty 19

## 2014-05-18 MED ORDER — ONDANSETRON 8 MG/NS 50 ML IVPB
INTRAVENOUS | Status: AC
  Filled 2014-05-18: qty 8

## 2014-05-18 MED ORDER — ZOLEDRONIC ACID 4 MG/5ML IV CONC
3.5000 mg | Freq: Once | INTRAVENOUS | Status: AC
  Administered 2014-05-18: 3.5 mg via INTRAVENOUS
  Filled 2014-05-18: qty 4.38

## 2014-05-18 MED ORDER — DEXAMETHASONE SODIUM PHOSPHATE 10 MG/ML IJ SOLN
INTRAMUSCULAR | Status: AC
  Filled 2014-05-18: qty 1

## 2014-05-18 MED ORDER — SODIUM CHLORIDE 0.9 % IV SOLN
Freq: Once | INTRAVENOUS | Status: AC
  Administered 2014-05-18: 15:00:00 via INTRAVENOUS

## 2014-05-18 MED ORDER — ONDANSETRON 8 MG/50ML IVPB (CHCC)
8.0000 mg | Freq: Once | INTRAVENOUS | Status: AC
  Administered 2014-05-18: 8 mg via INTRAVENOUS

## 2014-05-18 NOTE — Patient Instructions (Signed)
[place] [place] Discharge Instructions for Patients Receiving Chemotherapy  Today you received the following chemotherapy agents: Kyprolis, Zometa  To help prevent nausea and vomiting after your treatment, we encourage you to take your nausea medication as prescribed by your physician.    If you develop nausea and vomiting that is not controlled by your nausea medication, call the clinic.   BELOW ARE SYMPTOMS THAT SHOULD BE REPORTED IMMEDIATELY:  *FEVER GREATER THAN 100.5 F  *CHILLS WITH OR WITHOUT FEVER  NAUSEA AND VOMITING THAT IS NOT CONTROLLED WITH YOUR NAUSEA MEDICATION  *UNUSUAL SHORTNESS OF BREATH  *UNUSUAL BRUISING OR BLEEDING  TENDERNESS IN MOUTH AND THROAT WITH OR WITHOUT PRESENCE OF ULCERS  *URINARY PROBLEMS  *BOWEL PROBLEMS  UNUSUAL RASH Items with * indicate a potential emergency and should be followed up as soon as possible.  Feel free to call the clinic you have any questions or concerns. The clinic phone number is [phone].

## 2014-05-19 ENCOUNTER — Encounter (HOSPITAL_COMMUNITY): Payer: Self-pay | Admitting: Pharmacy Technician

## 2014-05-19 ENCOUNTER — Ambulatory Visit (HOSPITAL_BASED_OUTPATIENT_CLINIC_OR_DEPARTMENT_OTHER): Payer: Medicare Other

## 2014-05-19 ENCOUNTER — Encounter: Payer: Self-pay | Admitting: *Deleted

## 2014-05-19 VITALS — BP 134/64 | Temp 97.6°F | Resp 18

## 2014-05-19 DIAGNOSIS — C9000 Multiple myeloma not having achieved remission: Secondary | ICD-10-CM

## 2014-05-19 DIAGNOSIS — Z5112 Encounter for antineoplastic immunotherapy: Secondary | ICD-10-CM

## 2014-05-19 MED ORDER — ONDANSETRON 8 MG/50ML IVPB (CHCC)
8.0000 mg | Freq: Once | INTRAVENOUS | Status: AC
  Administered 2014-05-19: 8 mg via INTRAVENOUS

## 2014-05-19 MED ORDER — DEXAMETHASONE SODIUM PHOSPHATE 10 MG/ML IJ SOLN
INTRAMUSCULAR | Status: AC
  Filled 2014-05-19: qty 1

## 2014-05-19 MED ORDER — DEXTROSE 5 % IV SOLN
20.0000 mg/m2 | Freq: Once | INTRAVENOUS | Status: AC
  Administered 2014-05-19: 38 mg via INTRAVENOUS
  Filled 2014-05-19: qty 19

## 2014-05-19 MED ORDER — SODIUM CHLORIDE 0.9 % IV SOLN
Freq: Once | INTRAVENOUS | Status: AC
  Administered 2014-05-19: 500 mL/h via INTRAVENOUS

## 2014-05-19 MED ORDER — DEXAMETHASONE SODIUM PHOSPHATE 10 MG/ML IJ SOLN
10.0000 mg | Freq: Once | INTRAMUSCULAR | Status: AC
  Administered 2014-05-19: 10 mg via INTRAVENOUS

## 2014-05-19 MED ORDER — SODIUM CHLORIDE 0.9 % IV SOLN
Freq: Once | INTRAVENOUS | Status: AC
  Administered 2014-05-19: 16:00:00 via INTRAVENOUS

## 2014-05-19 MED ORDER — ONDANSETRON 8 MG/NS 50 ML IVPB
INTRAVENOUS | Status: AC
  Filled 2014-05-19: qty 8

## 2014-05-19 NOTE — Patient Instructions (Signed)
[facility]charge [facility] for Patients Receiving Chemotherapy  Today you received the following chemotherapy agents: Kyprolis  To help prevent nausea and vomiting after your treatment, we encourage you to take your nausea medication: as directed.   If you develop nausea and vomiting that is not controlled by your nausea medication, call the clinic.   BELOW ARE SYMPTOMS THAT SHOULD BE REPORTED IMMEDIATELY:  *FEVER GREATER THAN 100.5 F  *CHILLS WITH OR WITHOUT FEVER  NAUSEA AND VOMITING THAT IS NOT CONTROLLED WITH YOUR NAUSEA MEDICATION  *UNUSUAL SHORTNESS OF BREATH  *UNUSUAL BRUISING OR BLEEDING  TENDERNESS IN MOUTH AND THROAT WITH OR WITHOUT PRESENCE OF ULCERS  *URINARY PROBLEMS  *BOWEL PROBLEMS  UNUSUAL RASH Items with * indicate a potential emergency and should be followed up as soon as possible.  Feel free to call the clinic you have any questions or concerns. The clinic phone number is [phone].    

## 2014-05-21 ENCOUNTER — Other Ambulatory Visit: Payer: Self-pay | Admitting: Radiology

## 2014-05-24 ENCOUNTER — Other Ambulatory Visit: Payer: Self-pay | Admitting: Hematology and Oncology

## 2014-05-24 ENCOUNTER — Ambulatory Visit (HOSPITAL_COMMUNITY)
Admission: RE | Admit: 2014-05-24 | Discharge: 2014-05-24 | Disposition: A | Discharge status: Home / Self Care | Payer: Medicare Other | Source: Ambulatory Visit | Attending: Hematology and Oncology | Admitting: Hematology and Oncology

## 2014-05-24 ENCOUNTER — Other Ambulatory Visit: Payer: Self-pay | Admitting: *Deleted

## 2014-05-24 ENCOUNTER — Encounter (HOSPITAL_COMMUNITY): Payer: Self-pay

## 2014-05-24 DIAGNOSIS — Z7982 Long term (current) use of aspirin: Secondary | ICD-10-CM | POA: Diagnosis not present

## 2014-05-24 DIAGNOSIS — I129 Hypertensive chronic kidney disease with stage 1 through stage 4 chronic kidney disease, or unspecified chronic kidney disease: Secondary | ICD-10-CM | POA: Insufficient documentation

## 2014-05-24 DIAGNOSIS — Z9221 Personal history of antineoplastic chemotherapy: Secondary | ICD-10-CM | POA: Insufficient documentation

## 2014-05-24 DIAGNOSIS — C9000 Multiple myeloma not having achieved remission: Secondary | ICD-10-CM | POA: Insufficient documentation

## 2014-05-24 DIAGNOSIS — Z452 Encounter for adjustment and management of vascular access device: Secondary | ICD-10-CM | POA: Insufficient documentation

## 2014-05-24 DIAGNOSIS — Z923 Personal history of irradiation: Secondary | ICD-10-CM | POA: Insufficient documentation

## 2014-05-24 DIAGNOSIS — N189 Chronic kidney disease, unspecified: Secondary | ICD-10-CM | POA: Diagnosis not present

## 2014-05-24 DIAGNOSIS — Z79899 Other long term (current) drug therapy: Secondary | ICD-10-CM | POA: Diagnosis not present

## 2014-05-24 HISTORY — DX: Multiple myeloma not having achieved remission: C90.00

## 2014-05-24 LAB — PROTIME-INR
INR: 1.09 (ref 0.00–1.49)
Prothrombin Time: 14.2 seconds (ref 11.6–15.2)

## 2014-05-24 LAB — CBC WITH DIFFERENTIAL/PLATELET
BASOS ABS: 0 10*3/uL (ref 0.0–0.1)
Basophils Relative: 0 % (ref 0–1)
EOS ABS: 0.2 10*3/uL (ref 0.0–0.7)
Eosinophils Relative: 5 % (ref 0–5)
HEMATOCRIT: 37.7 % — AB (ref 39.0–52.0)
Hemoglobin: 12.7 g/dL — ABNORMAL LOW (ref 13.0–17.0)
Lymphocytes Relative: 26 % (ref 12–46)
Lymphs Abs: 1.2 10*3/uL (ref 0.7–4.0)
MCH: 33 pg (ref 26.0–34.0)
MCHC: 33.7 g/dL (ref 30.0–36.0)
MCV: 97.9 fL (ref 78.0–100.0)
MONO ABS: 1 10*3/uL (ref 0.1–1.0)
Monocytes Relative: 21 % — ABNORMAL HIGH (ref 3–12)
NEUTROS ABS: 2.4 10*3/uL (ref 1.7–7.7)
Neutrophils Relative %: 48 % (ref 43–77)
Platelets: 147 10*3/uL — ABNORMAL LOW (ref 150–400)
RBC: 3.85 MIL/uL — ABNORMAL LOW (ref 4.22–5.81)
RDW: 14.8 % (ref 11.5–15.5)
WBC: 4.8 10*3/uL (ref 4.0–10.5)

## 2014-05-24 LAB — APTT: aPTT: 28 seconds (ref 24–37)

## 2014-05-24 MED ORDER — HEPARIN SOD (PORK) LOCK FLUSH 100 UNIT/ML IV SOLN
INTRAVENOUS | Status: AC
  Filled 2014-05-24: qty 5

## 2014-05-24 MED ORDER — FENTANYL CITRATE 0.05 MG/ML IJ SOLN
INTRAMUSCULAR | Status: AC
  Filled 2014-05-24: qty 6

## 2014-05-24 MED ORDER — MIDAZOLAM HCL 2 MG/2ML IJ SOLN
INTRAMUSCULAR | Status: AC
  Filled 2014-05-24: qty 6

## 2014-05-24 MED ORDER — SODIUM CHLORIDE 0.9 % IV SOLN
INTRAVENOUS | Status: DC
  Administered 2014-05-24: 500 mL via INTRAVENOUS

## 2014-05-24 MED ORDER — LIDOCAINE-PRILOCAINE 2.5-2.5 % EX CREA: 1.0000 "application " | TOPICAL_CREAM | CUTANEOUS | Status: AC | PRN

## 2014-05-24 MED ORDER — HEPARIN SOD (PORK) LOCK FLUSH 100 UNIT/ML IV SOLN
INTRAVENOUS | Status: AC | PRN
  Administered 2014-05-24: 500 [IU]

## 2014-05-24 MED ORDER — MIDAZOLAM HCL 2 MG/2ML IJ SOLN
INTRAMUSCULAR | Status: AC | PRN
  Administered 2014-05-24 (×2): 1 mg via INTRAVENOUS
  Administered 2014-05-24: 0.5 mg via INTRAVENOUS

## 2014-05-24 MED ORDER — LIDOCAINE-EPINEPHRINE (PF) 2 %-1:200000 IJ SOLN
INTRAMUSCULAR | Status: AC
  Filled 2014-05-24: qty 20

## 2014-05-24 MED ORDER — FENTANYL CITRATE 0.05 MG/ML IJ SOLN
INTRAMUSCULAR | Status: AC | PRN
  Administered 2014-05-24: 25 ug via INTRAVENOUS
  Administered 2014-05-24: 50 ug via INTRAVENOUS

## 2014-05-24 MED ORDER — CEFAZOLIN SODIUM-DEXTROSE 2-3 GM-% IV SOLR
2.0000 g | INTRAVENOUS | Status: AC
  Administered 2014-05-24: 2 g via INTRAVENOUS

## 2014-05-24 MED ORDER — CEFAZOLIN SODIUM-DEXTROSE 2-3 GM-% IV SOLR
INTRAVENOUS | Status: AC
  Administered 2014-05-24: 2 g via INTRAVENOUS
  Filled 2014-05-24: qty 50

## 2014-05-24 NOTE — Discharge Instructions (Signed)

## 2014-05-24 NOTE — Procedures (Signed)
Successful placement of right IJ approach port-a-cath with tip at the superior caval atrial junction. The catheter is ready for immediate use. No immediate post procedural complications. 

## 2014-05-24 NOTE — H&P (Signed)
Chief Complaint: "I am here for a port."  Referring Physician(s): Gorsuch,Ni  History of Present Illness: Trevor Michael is a 73 y.o. male with multiple myeloma scheduled today for a port a catheter placement. He denies any chest pain, shortness of breath or palpitations. He denies any active signs of bleeding or excessive bruising. He denies any recent fever or chills. The patient denies any history of sleep apnea or chronic oxygen use. He has previously tolerated sedation without complications.    Past Medical History  Diagnosis Date  . Other and unspecified hyperlipidemia   . Essential hypertension, benign   . BPH (benign prostatic hyperplasia)   . Early stage glaucoma   . Heart murmur     "since my teens" (06/18/2012)  . Cervical stenosis of spine   . GERD (gastroesophageal reflux disease)   . Bone cancer     multiple bone lesion/right humerus  . Spinal stenosis   . Chronic kidney disease     renal insufficiency  . Arthritis   . DDD (degenerative disc disease)     c-5=-c6, and spondylosis  . Cancer 05/2012    IgA lambda multiple myeloma  . Anorexia 06/25/12    1 month hx   . Diverticulitis   . History of chemotherapy 07/01/12    Velcade,Cytoxin and decadron  . Prostate cancer 07/28/13    Gleason 7, volume 50 gm  . Murmur   . History of radiation therapy 07/10/12- 07/28/12    right humerus, 25 gy in 10 fractions, Dr Sondra Come  . Myeloma     Past Surgical History  Procedure Laterality Date  . Lipoma excision  07/1986    left shoulder/scapula  . Esophagogastroduodenoscopy  06/20/2012    Procedure: ESOPHAGOGASTRODUODENOSCOPY (EGD);  Surgeon: Beryle Beams, MD;  Location: [facility];  Service: Endoscopy;  Laterality: N/A;  . Prostate biopsy  2008    benign  . Prostate biopsy  07/28/13    Gleason 7  . Shoulder surgery      Allergies: Review of patient's allergies indicates no known allergies.  Medications: Prior to Admission medications   Medication Sig  Start Date End Date Taking? Authorizing Provider  acetaminophen (TYLENOL) 500 MG tablet Take 1,000 mg by mouth every 6 (six) hours as needed. For pain   Yes Historical Provider, MD  acyclovir (ZOVIRAX) 400 MG tablet Take 400 mg by mouth 2 (two) times daily.   Yes Historical Provider, MD  allopurinol (ZYLOPRIM) 100 MG tablet Take 100 mg by mouth 2 (two) times daily.    Yes Historical Provider, MD  aspirin EC 81 MG tablet Take 81 mg by mouth daily after breakfast.   Yes Historical Provider, MD  atenolol-chlorthalidone (TENORETIC) 50-25 MG per tablet Take 1 tablet by mouth every morning.   Yes Historical Provider, MD  bimatoprost (LUMIGAN) 0.03 % ophthalmic drops Place 1 drop into both eyes at bedtime.     Yes Historical Provider, MD  brimonidine-timolol (COMBIGAN) 0.2-0.5 % ophthalmic solution Place 1 drop into both eyes 2 (two) times daily.     Yes Historical Provider, MD  Cholecalciferol (VITAMIN D-3) 1000 UNITS CAPS Take 1 capsule by mouth daily after breakfast.   Yes Historical Provider, MD  folic acid (FOLVITE) 1 MG tablet Take 1 mg by mouth daily at 12 noon.   Yes Historical Provider, MD  lenalidomide (REVLIMID) 5 MG capsule Take 5 mg by mouth every morning. For 3 weeks and then off for 7 days.   Yes Historical Provider, MD  loperamide (IMODIUM) 2 MG capsule Take 2 mg by mouth as needed for diarrhea or loose stools.   Yes Historical Provider, MD  loratadine (CLARITIN) 10 MG tablet Take 10 mg by mouth daily as needed for allergies.   Yes Historical Provider, MD  potassium chloride SA (K-DUR,KLOR-CON) 20 MEQ tablet Take 20 mEq by mouth 2 (two) times daily.   Yes Historical Provider, MD  simvastatin (ZOCOR) 20 MG tablet Take 20 mg by mouth at bedtime.   Yes Historical Provider, MD  tamsulosin (FLOMAX) 0.4 MG CAPS capsule Take 0.4 mg by mouth daily after lunch.   Yes Historical Provider, MD  dexamethasone (DECADRON) 4 MG tablet Take 20 mg by mouth once a week. At lunch    Historical Provider, MD    lactose free nutrition (BOOST) LIQD Take 237 mLs by mouth daily.    Historical Provider, MD  lidocaine-prilocaine (EMLA) cream Apply 1 application topically as needed. Apply to port a cath site one hour prior to needle stick. 05/24/14   Heath Lark, MD  prochlorperazine (COMPAZINE) 10 MG tablet Take 10 mg by mouth every 6 (six) hours as needed for nausea or vomiting.    Historical Provider, MD  senna (SENOKOT) 8.6 MG TABS tablet Take 1-2 tablets by mouth daily as needed for mild constipation.     Historical Provider, MD    Family History  Problem Relation Age of Onset  . Benign prostatic hyperplasia Father   . Pneumonia Father   . Coronary artery disease Father     PTCA  . Heart disease Brother     CABG, CAD,  . Benign prostatic hyperplasia Brother     History   Social History  . Marital Status: Married    Spouse Name: N/A    Number of Children: N/A  . Years of Education: 1   Occupational History  . businessman     semi-retired   Social History Main Topics  . Smoking status: Never Smoker   . Smokeless tobacco: Never Used  . Alcohol Use: No  . Drug Use: No  . Sexual Activity: Not Currently    Partners: Female   Other Topics Concern  . None   Social History Narrative   [employer] BS, MBA [employer].  Married 10/27/61. 1 daughter '64-deceased 05/08/2023 sepsis.  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 Marriage in good health. Playing golf. Doing well overwell.    Review of Systems: A 12 point ROS discussed and pertinent positives are indicated in the HPI above.  All other systems are negative.  Review of Systems  Vital Signs: BP 162/76  Pulse 71  Temp(Src) 98 F (36.7 C) (Oral)  Resp 16  Ht 5' 9.5" (1.765 m)  Wt 168 lb (76.204 kg)  BMI 24.46 kg/m2  SpO2 100%  Physical  Exam  Constitutional: He is oriented to person, place, and time. He appears well-developed and well-nourished. No distress.  HENT:  Head: Normocephalic and atraumatic.  Cardiovascular: Normal rate and regular rhythm.  Exam reveals no gallop and no friction rub.   No murmur heard. Pulmonary/Chest: Effort normal and breath sounds normal. No respiratory distress. He has no wheezes. He has no rales.  Abdominal: Soft. Bowel sounds are normal. He exhibits no distension. There is no tenderness.  Neurological: He is alert and oriented to person, place, and time.  Skin: He is not diaphoretic.  Psychiatric: He has a normal mood and affect. His behavior is normal. Thought content normal.    Imaging: No results found.  Labs:  CBC:  Recent Labs  05/04/14 1227 05/11/14 1411 05/18/14 1410 05/24/14 1155  WBC 4.4 5.2 6.3 4.8  HGB 12.9* 13.0 12.3* 12.7*  HCT 38.8 38.1* 37.3* 37.7*  PLT 179 117* 139* 147*    COAGS:  Recent Labs  05/24/14 1155  INR 1.09  APTT 28    BMP:  Recent Labs  11/14/13 0306 11/15/13 0340 11/16/13 0650 11/17/13 0650  04/20/14 1324 05/04/14 1228 05/11/14 1411 05/18/14 1410  NA 135* 137 136* 138  < > 141 141 144 142  K 3.3* 3.6* 4.2 4.1  < > 3.5 3.7 3.6 3.7  CL 99 96 101 101  --   --   --   --   --   CO2 18* 25 20 23   < > 25 25 29 28   GLUCOSE 137* 114* 87 95  < > 109 104 98 92  BUN 52* 51* 49* 44*  < > 16.5 17.6 16.1 14.1  CALCIUM 6.1* 6.3* 6.6* 6.8*  < > 9.0 9.1 9.2 9.4  CREATININE 3.82* 3.40* 3.29* 3.22*  < > 0.9 0.9 1.1 1.2  GFRNONAA 14* 17* 17* 18*  --   --   --   --   --   GFRAA 17* 19* 20* 21*  --   --   --   --   --   < > = values in this interval not displayed.  LIVER FUNCTION TESTS:  Recent Labs  04/06/14 0827 05/04/14 1228 05/11/14 1411 05/18/14 1410  BILITOT 0.92 0.90 0.85 0.99  AST 18 17 19 18   ALT 14 21 16 18   ALKPHOS 55 60 60 62  PROT 6.2* 6.4 6.2* 6.0*  ALBUMIN 3.8 3.7 3.6 3.5    TUMOR MARKERS: No results found for this  basename: AFPTM, CEA, CA199, CHROMGRNA,  in the last 8760 hours  Assessment and Plan: Multiple Myeloma Scheduled today for image guided port a catheter placement with moderate sedation Patient has been NPO, no blood thinners, labs reviewed Risks and Benefits discussed with the patient. All of the patient's questions were answered, patient is agreeable to proceed. Consent signed and in chart.   SignedHedy Jacob 05/24/2014, 1:17 PM

## 2014-05-25 ENCOUNTER — Ambulatory Visit: Payer: Medicare Other

## 2014-05-25 ENCOUNTER — Other Ambulatory Visit: Payer: Self-pay | Admitting: *Deleted

## 2014-05-25 NOTE — Telephone Encounter (Signed)
THIS REFILL REQUEST FOR REVLIMID WAS PLACED ON DR.GORSUCH'S DESK. 

## 2014-05-26 ENCOUNTER — Other Ambulatory Visit: Payer: Self-pay | Admitting: *Deleted

## 2014-05-26 ENCOUNTER — Ambulatory Visit: Payer: Medicare Other

## 2014-05-26 DIAGNOSIS — C9000 Multiple myeloma not having achieved remission: Secondary | ICD-10-CM

## 2014-05-26 MED ORDER — LENALIDOMIDE 5 MG PO CAPS: 5.0000 mg | ORAL_CAPSULE | Freq: Every day | ORAL | Status: DC

## 2014-05-27 NOTE — Telephone Encounter (Signed)
RECEIVED A FAX FROM [facility] CONCERNING A CONFIRMATION OF FACSIMILE RECEIPT. 

## 2014-06-01 ENCOUNTER — Other Ambulatory Visit (HOSPITAL_BASED_OUTPATIENT_CLINIC_OR_DEPARTMENT_OTHER): Payer: Medicare Other

## 2014-06-01 ENCOUNTER — Telehealth: Payer: Self-pay | Admitting: *Deleted

## 2014-06-01 ENCOUNTER — Ambulatory Visit (HOSPITAL_BASED_OUTPATIENT_CLINIC_OR_DEPARTMENT_OTHER): Payer: Medicare Other

## 2014-06-01 ENCOUNTER — Ambulatory Visit (HOSPITAL_BASED_OUTPATIENT_CLINIC_OR_DEPARTMENT_OTHER): Payer: Medicare Other | Admitting: Hematology and Oncology

## 2014-06-01 ENCOUNTER — Telehealth: Payer: Self-pay | Admitting: Hematology and Oncology

## 2014-06-01 ENCOUNTER — Encounter: Payer: Self-pay | Admitting: Hematology and Oncology

## 2014-06-01 VITALS — BP 154/60 | HR 77 | Temp 97.9°F | Resp 18 | Ht 69.5 in | Wt 166.1 lb

## 2014-06-01 DIAGNOSIS — C9000 Multiple myeloma not having achieved remission: Secondary | ICD-10-CM

## 2014-06-01 DIAGNOSIS — Z5112 Encounter for antineoplastic immunotherapy: Secondary | ICD-10-CM

## 2014-06-01 DIAGNOSIS — E876 Hypokalemia: Secondary | ICD-10-CM

## 2014-06-01 DIAGNOSIS — C61 Malignant neoplasm of prostate: Secondary | ICD-10-CM

## 2014-06-01 DIAGNOSIS — D630 Anemia in neoplastic disease: Secondary | ICD-10-CM

## 2014-06-01 DIAGNOSIS — Z23 Encounter for immunization: Secondary | ICD-10-CM

## 2014-06-01 HISTORY — DX: Hypokalemia: E87.6

## 2014-06-01 LAB — COMPREHENSIVE METABOLIC PANEL (CC13)
ALK PHOS: 65 U/L (ref 40–150)
ALT: 17 U/L (ref 0–55)
AST: 20 U/L (ref 5–34)
Albumin: 3.5 g/dL (ref 3.5–5.0)
Anion Gap: 11 mEq/L (ref 3–11)
BILIRUBIN TOTAL: 1.01 mg/dL (ref 0.20–1.20)
BUN: 16.8 mg/dL (ref 7.0–26.0)
CO2: 25 mEq/L (ref 22–29)
CREATININE: 1.2 mg/dL (ref 0.7–1.3)
Calcium: 9.5 mg/dL (ref 8.4–10.4)
Chloride: 105 mEq/L (ref 98–109)
Glucose: 107 mg/dl (ref 70–140)
Potassium: 3.5 mEq/L (ref 3.5–5.1)
Sodium: 141 mEq/L (ref 136–145)
Total Protein: 6.6 g/dL (ref 6.4–8.3)

## 2014-06-01 LAB — CBC WITH DIFFERENTIAL/PLATELET
BASO%: 1.3 % (ref 0.0–2.0)
BASOS ABS: 0.1 10*3/uL (ref 0.0–0.1)
EOS%: 0.9 % (ref 0.0–7.0)
Eosinophils Absolute: 0.1 10*3/uL (ref 0.0–0.5)
HEMATOCRIT: 35.3 % — AB (ref 38.4–49.9)
HEMOGLOBIN: 11.8 g/dL — AB (ref 13.0–17.1)
LYMPH%: 13.8 % — AB (ref 14.0–49.0)
MCH: 33.7 pg — ABNORMAL HIGH (ref 27.2–33.4)
MCHC: 33.6 g/dL (ref 32.0–36.0)
MCV: 100.2 fL — AB (ref 79.3–98.0)
MONO#: 1.6 10*3/uL — AB (ref 0.1–0.9)
MONO%: 21.5 % — AB (ref 0.0–14.0)
NEUT%: 62.5 % (ref 39.0–75.0)
NEUTROS ABS: 4.7 10*3/uL (ref 1.5–6.5)
PLATELETS: 197 10*3/uL (ref 140–400)
RBC: 3.52 10*6/uL — AB (ref 4.20–5.82)
RDW: 15.9 % — ABNORMAL HIGH (ref 11.0–14.6)
WBC: 7.5 10*3/uL (ref 4.0–10.3)
lymph#: 1 10*3/uL (ref 0.9–3.3)

## 2014-06-01 MED ORDER — SODIUM CHLORIDE 0.9 % IJ SOLN
10.0000 mL | INTRAMUSCULAR | Status: DC | PRN
  Administered 2014-06-01: 10 mL
  Filled 2014-06-01: qty 10

## 2014-06-01 MED ORDER — POTASSIUM CHLORIDE CRYS ER 20 MEQ PO TBCR: 20.0000 meq | EXTENDED_RELEASE_TABLET | Freq: Every day | ORAL | Status: AC

## 2014-06-01 MED ORDER — SODIUM CHLORIDE 0.9 % IV SOLN
Freq: Once | INTRAVENOUS | Status: AC
Start: 2014-06-01 — End: 2014-06-01
  Administered 2014-06-01: 13:00:00 via INTRAVENOUS

## 2014-06-01 MED ORDER — CARFILZOMIB CHEMO INJECTION 60 MG
20.0000 mg/m2 | Freq: Once | INTRAVENOUS | Status: AC
  Administered 2014-06-01: 38 mg via INTRAVENOUS
  Filled 2014-06-01: qty 19

## 2014-06-01 MED ORDER — DEXAMETHASONE SODIUM PHOSPHATE 10 MG/ML IJ SOLN
INTRAMUSCULAR | Status: AC
  Filled 2014-06-01: qty 1

## 2014-06-01 MED ORDER — DEXAMETHASONE SODIUM PHOSPHATE 10 MG/ML IJ SOLN
10.0000 mg | Freq: Once | INTRAMUSCULAR | Status: AC
  Administered 2014-06-01: 10 mg via INTRAVENOUS

## 2014-06-01 MED ORDER — INFLUENZA VAC SPLIT QUAD 0.5 ML IM SUSY: 0.5000 mL | PREFILLED_SYRINGE | INTRAMUSCULAR | Status: DC

## 2014-06-01 MED ORDER — ONDANSETRON 8 MG/50ML IVPB (CHCC)
8.0000 mg | Freq: Once | INTRAVENOUS | Status: AC
  Administered 2014-06-01: 8 mg via INTRAVENOUS

## 2014-06-01 MED ORDER — DEXAMETHASONE 4 MG PO TABS: 20.0000 mg | ORAL_TABLET | ORAL | Status: AC

## 2014-06-01 MED ORDER — HEPARIN SOD (PORK) LOCK FLUSH 100 UNIT/ML IV SOLN
500.0000 [IU] | Freq: Once | INTRAVENOUS | Status: AC | PRN
  Administered 2014-06-01: 500 [IU]
  Filled 2014-06-01: qty 5

## 2014-06-01 MED ORDER — ONDANSETRON 8 MG/NS 50 ML IVPB
INTRAVENOUS | Status: AC
  Filled 2014-06-01: qty 8

## 2014-06-01 NOTE — Assessment & Plan Note (Signed)
Clinically, he has no symptoms. Continue surveillance program under urologist

## 2014-06-01 NOTE — Telephone Encounter (Signed)
Per staff message and POF I have scheduled appts. Advised scheduler of appts. JMW  

## 2014-06-01 NOTE — Telephone Encounter (Signed)
RECEIVED A FAX FROM [facility] CONCERNING A CONFIRMATION OF PRESCRIPTION SHIPMENT FOR REVLIMID ON 05/31/14.

## 2014-06-01 NOTE — Assessment & Plan Note (Signed)
He has mild progression of disease with detectable M spike and elevated light chains. I discussed options with the patient and his wife. I would recommend either consideration to increase the dose of Revlimid, refer him back to [facility] for bone marrow transplant again, or consideration for clinical trial with Elotuzumab and Revlimid. I discussed with the patient the rationale behind each treatment options and he would like to think about it. I have redrawn and his blood work today, results will not be available until a week from now. In the meantime, I will proceed with chemotherapy as scheduled without dosage adjustment.

## 2014-06-01 NOTE — Patient Instructions (Signed)
Carfilzomib injection What is this medicine? CARFILZOMIB (kar FILZ oh mib) is a chemotherapy drug that works by slowing or stopping cancer cell growth. This medicine is used to treat multiple myeloma. This medicine may be used for other purposes; ask your health care provider or pharmacist if you have questions. COMMON BRAND NAME(S): KYPROLIS What should I tell my health care provider before I take this medicine? They need to know if you have any of these conditions: -heart disease -irregular heartbeat -liver disease -lung or breathing disease -an unusual or allergic reaction to carfilzomib, or other medicines, foods, dyes, or preservatives -pregnant or trying to get pregnant -breast-feeding How should I use this medicine? This medicine is for injection or infusion into a vein. It is given by a health care professional in a hospital or clinic setting. Talk to your pediatrician regarding the use of this medicine in children. Special care may be needed. Overdosage: If you think you've taken too much of this medicine contact a poison control center or emergency room at once. Overdosage: If you think you have taken too much of this medicine contact a poison control center or emergency room at once. NOTE: This medicine is only for you. Do not share this medicine with others. What if I miss a dose? It is important not to miss your dose. Call your doctor or health care professional if you are unable to keep an appointment. What may interact with this medicine? Interactions are not expected. Give your health care provider a list of all the medicines, herbs, non-prescription drugs, or dietary supplements you use. Also tell them if you smoke, drink alcohol, or use illegal drugs. Some items may interact with your medicine. This list may not describe all possible interactions. Give your health care provider a list of all the medicines, herbs, non-prescription drugs, or dietary supplements you use. Also  tell them if you smoke, drink alcohol, or use illegal drugs. Some items may interact with your medicine. What should I watch for while using this medicine? Your condition will be monitored carefully while you are receiving this medicine. Report any side effects. Continue your course of treatment even though you feel ill unless your doctor tells you to stop. Call your doctor or health care professional for advice if you get a fever, chills or sore throat, or other symptoms of a cold or flu. Do not treat yourself. Try to avoid being around people who are sick. Do not become pregnant while taking this medicine. Women should inform their doctor if they wish to become pregnant or think they might be pregnant. There is a potential for serious side effects to an unborn child. Talk to your health care professional or pharmacist for more information. Do not breast-feed an infant while taking this medicine. Check with your doctor or health care professional if you get an attack of severe diarrhea, nausea and vomiting, or if you sweat a lot. The loss of too much body fluid can make it dangerous for you to take this medicine. You may get dizzy. Do not drive, use machinery, or do anything that needs mental alertness until you know how this medicine affects you. Do not stand or sit up quickly, especially if you are an older patient. This reduces the risk of dizzy or fainting spells. What side effects may I notice from receiving this medicine? Side effects that you should report to your doctor or health care professional as soon as possible: -allergic reactions like skin rash, itching or hives,  swelling of the face, lips, or tongue -breathing problems -chest pain or palpitationschest tightness -cough -dark urine -dizziness -feeling faint or lightheaded -fever or chills -general ill feeling or flu-like symptoms -light-colored stools -palpitations -right upper belly pain -swelling of the legs or ankles -unusual  bleeding or bruising -unusually weak or tired -yellowing of the eyes or skin Side effects that usually do not require medical attention (Report these to your doctor or health care professional if they continue or are bothersome.): -diarrhea -headache -nausea, vomiting -tiredness This list may not describe all possible side effects. Call your doctor for medical advice about side effects. You may report side effects to FDA at 1-800-FDA-1088. Where should I keep my medicine? This drug is given in a hospital or clinic and will not be stored at home. NOTE: This sheet is a summary. It may not cover all possible information. If you have questions about this medicine, talk to your doctor, pharmacist, or health care provider.  2015, Elsevier/Gold Standard. (2012-01-25 17:02:29)

## 2014-06-01 NOTE — Assessment & Plan Note (Signed)
This is likely due to recent treatment. The patient denies recent history of bleeding such as epistaxis, hematuria or hematochezia. He is asymptomatic from the anemia. I will observe for now.  He does not require transfusion now. I will continue the chemotherapy at current dose without dosage adjustment.  If the anemia gets progressive worse in the future, I might have to delay his treatment or adjust the chemotherapy dose.  

## 2014-06-01 NOTE — Progress Notes (Signed)
[facility] OFFICE PROGRESS NOTE  Patient Care Team: Neena Rhymes, MD as PCP - General Linton Rump, MD (Ophthalmology) Bernestine Amass, MD (Urology) Marily Memos, MD (Orthopedic Surgery) Jeanann Lewandowsky, MD as Consulting Physician (Hematology and Oncology)  SUMMARY OF ONCOLOGIC HISTORY:   Multiple myeloma   05/21/2012 Initial Diagnosis Multiple myeloma, ISS stage III.   06/19/2012 Imaging Lytic lesions in the calvaria and right hemipelvis and a possible lesion in the right humerus.    06/20/2012 Imaging CT scan of the right humerus showed a 2.3 cm lucent lesion with probable mild endosteal thinning anteriorly near the bicipital groove.  several small lytic lesions in humeral head, scapula, glenoid, and proximal radius.     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 Plasma cells are lambda ligh chain restricted.    06/24/2012 Pathology Results Cytogenetics revealed the presence of normal male chormosomes with no observable clonal abnormalities.  Single cell with additional chromosome material on 3q, loss of 13 .  DNA, gain of chromosome 11.    07/01/2012 - 01/02/2013 Chemotherapy Velcade, cytoxan and decadron. doses were increased on 11/06/2012.  Neulasta was started on 08/16/2012 because of leukopenia.    07/10/2012 - 07/28/2012 Radiation Therapy He received XRT to the right humerus 25 Gy in 10 fractions.    12/04/2012 Bone Marrow Biopsy Hypercllular for age with trilineage hematopoiesis in addition to increased number of atypical plasma cells estimated at 24% of all cells in the aspirate. Lamda light chain restriction. rare circulating plasma cells.   12/04/2012 Pathology Results Cytogenetics revealed the presence of 2 clonal cell lines. first line with chromosal normal (25%).  2nd line was abnormal, missing a Y chromosome. A single cell with an extra chromosome 11,14,15 and 22  with an 11;14 translocation. FISH showed gain of 11.    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  S/p melphalan 140 mg /m2 on 01/27/2013.    06/27/2013 Tumor Marker 24-hour urine yielded 17.1 gram of protein of which 16.9 grams was free lambda light chains.  Beta 2 microglobulin was 5.8. Albumin 3.2. IgG 6,320.    07/07/2013 - 09/01/2013 Chemotherapy Maintenance velcade started.   09/21/2013 Progression Counts decreasing; WBC followed by Plts. Referred to [facility] for recommendations for next therapy.    10/12/2013 Bone Marrow Biopsy Done at [facility].  Per care everywhere, PLASMA CELLS COMPRISE 80% OF A PACKED BONE MARROW.    10/22/2013 Tumor Marker Labs done at [facility].  spep m-spike 0.08, FLC lambda 379.00, ratio 0. 24 hour urine, m-spike 16632, IFE positive for monoclonal lambda.   10/27/2013 -  Chemotherapy Started salvage chemotherapy with carlfozomib plus dexamethasone plus revlimid.    11/09/2013 - 11/17/2013 Hospital Admission Admitted with fever.  Started on broad spectrum antibiotics. Had severe anemia and was give hemoglobin with questionable tranfusion reaction. 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 Discharged off antibiotics.    01/21/2014 -  Chemotherapy Zometa 3.5 mg monthly started.    03/02/2014 Treatment Plan Change Decreased Revlimide to 5 mg (down from 10 mg) daily for 21 days on and 7 days off every 28 days due persistent leukopenia.    05/04/2014 Remission He has achieved good response to treatment. I recommend dexamethasone taper starting at 20 mg weekly.    INTERVAL HISTORY: Please see below for problem oriented charting. He is seen prior to cycle 8 of chemotherapy. He denies significant side effects of treatment. He has  occasional arthritis pain affecting his shoulder that comes and goes.  REVIEW OF SYSTEMS:   Constitutional: Denies fevers, chills or abnormal weight loss Eyes: Denies blurriness of vision Ears, nose, mouth, throat, and face: Denies  mucositis or sore throat Respiratory: Denies cough, dyspnea or wheezes Cardiovascular: Denies palpitation, chest discomfort or lower extremity swelling Gastrointestinal:  Denies nausea, heartburn or change in bowel habits Skin: Denies abnormal skin rashes Lymphatics: Denies new lymphadenopathy or easy bruising Neurological:Denies numbness, tingling or new weaknesses Behavioral/Psych: Mood is stable, no new changes  All other systems were reviewed with the patient and are negative.  I have reviewed the past medical history, past surgical history, social history and family history with the patient and they are unchanged from previous note.  ALLERGIES:  has No Known Allergies.  MEDICATIONS:  Current Outpatient Prescriptions  Medication Sig Dispense Refill  . acetaminophen (TYLENOL) 500 MG tablet Take 1,000 mg by mouth every 6 (six) hours as needed. For pain      . acyclovir (ZOVIRAX) 400 MG tablet Take 400 mg by mouth 2 (two) times daily.      Marland Kitchen allopurinol (ZYLOPRIM) 100 MG tablet Take 100 mg by mouth 2 (two) times daily.       Marland Kitchen aspirin EC 81 MG tablet Take 81 mg by mouth daily after breakfast.      . atenolol-chlorthalidone (TENORETIC) 50-25 MG per tablet Take 1 tablet by mouth every morning.      . bimatoprost (LUMIGAN) 0.03 % ophthalmic drops Place 1 drop into both eyes at bedtime.        . brimonidine-timolol (COMBIGAN) 0.2-0.5 % ophthalmic solution Place 1 drop into both eyes 2 (two) times daily.        . Cholecalciferol (VITAMIN D-3) 1000 UNITS CAPS Take 1 capsule by mouth daily after breakfast.      . dexamethasone (DECADRON) 4 MG tablet Take 5 tablets (20 mg total) by mouth once a week. At lunch  60 tablet  4  . folic acid (FOLVITE) 1 MG tablet Take 1 mg by mouth daily at 12 noon.      . lactose free nutrition (BOOST) LIQD Take 237 mLs by mouth daily.      Marland Kitchen lenalidomide (REVLIMID) 5 MG capsule Take 1 capsule (5 mg total) by mouth daily. For 3 weeks and then off for 7 days.  21  capsule  0  . lidocaine-prilocaine (EMLA) cream Apply 1 application topically as needed. Apply to port a cath site one hour prior to needle stick.  30 g  3  . loperamide (IMODIUM) 2 MG capsule Take 2 mg by mouth as needed for diarrhea or loose stools.      Marland Kitchen loratadine (CLARITIN) 10 MG tablet Take 10 mg by mouth daily as needed for allergies.      . potassium chloride SA (K-DUR,KLOR-CON) 20 MEQ tablet Take 1 tablet (20 mEq total) by mouth daily.  30 tablet  0  . senna (SENOKOT) 8.6 MG TABS tablet Take 1-2 tablets by mouth daily as needed for mild constipation.       . simvastatin (ZOCOR) 20 MG tablet Take 20 mg by mouth at bedtime.      . tamsulosin (FLOMAX) 0.4 MG CAPS capsule Take 0.4 mg by mouth daily after lunch.      . prochlorperazine (COMPAZINE) 10 MG tablet Take 10 mg by mouth every 6 (six) hours as needed for nausea or vomiting.       No current facility-administered medications for  this visit.   Facility-Administered Medications Ordered in Other Visits  Medication Dose Route Frequency Provider Last Rate Last Dose  . sodium chloride 0.9 % injection 10 mL  10 mL Intracatheter PRN Heath Lark, MD   10 mL at 06/01/14 1406    PHYSICAL EXAMINATION: ECOG PERFORMANCE STATUS: 1 - Symptomatic but completely ambulatory  Filed Vitals:   06/01/14 1159  BP: 154/60  Pulse: 77  Temp: 97.9 F (36.6 C)  Resp: 18   Filed Weights   06/01/14 1159  Weight: 166 lb 1.6 oz (75.342 kg)    GENERAL:alert, no distress and comfortable SKIN: skin color, texture, turgor are normal, no rashes or significant lesions EYES: normal, Conjunctiva are pink and non-injected, sclera clear OROPHARYNX:no exudate, no erythema and lips, buccal mucosa, and tongue normal  Musculoskeletal:no cyanosis of digits and no clubbing  NEURO: alert & oriented x 3 with fluent speech, no focal motor/sensory deficits  LABORATORY DATA:  I have reviewed the data as listed    Component Value Date/Time   NA 141 06/01/2014 1143    NA 138 11/17/2013 0650   K 3.5 06/01/2014 1143   K 4.1 11/17/2013 0650   CL 101 11/17/2013 0650   CL 102 12/05/2012 0845   CO2 25 06/01/2014 1143   CO2 23 11/17/2013 0650   GLUCOSE 107 06/01/2014 1143   GLUCOSE 95 11/17/2013 0650   GLUCOSE 87 12/05/2012 0845   BUN 16.8 06/01/2014 1143   BUN 44* 11/17/2013 0650   CREATININE 1.2 06/01/2014 1143   CREATININE 3.22* 11/17/2013 0650   CREATININE 1.88* 06/27/2012 1039   CALCIUM 9.5 06/01/2014 1143   CALCIUM 6.8* 11/17/2013 0650   PROT 6.6 06/01/2014 1143   PROT 5.6* 11/16/2013 0650   ALBUMIN 3.5 06/01/2014 1143   ALBUMIN 2.8* 11/17/2013 0650   AST 20 06/01/2014 1143   AST 23 11/16/2013 0650   ALT 17 06/01/2014 1143   ALT 11 11/16/2013 0650   ALKPHOS 65 06/01/2014 1143   ALKPHOS 78 11/16/2013 0650   BILITOT 1.01 06/01/2014 1143   BILITOT 0.8 11/16/2013 0650   GFRNONAA 18* 11/17/2013 0650   GFRAA 21* 11/17/2013 0650    No results found for this basename: SPEP, UPEP,  kappa and lambda light chains    Lab Results  Component Value Date   WBC 7.5 06/01/2014   NEUTROABS 4.7 06/01/2014   HGB 11.8* 06/01/2014   HCT 35.3* 06/01/2014   MCV 100.2* 06/01/2014   PLT 197 06/01/2014      Chemistry      Component Value Date/Time   NA 141 06/01/2014 1143   NA 138 11/17/2013 0650   K 3.5 06/01/2014 1143   K 4.1 11/17/2013 0650   CL 101 11/17/2013 0650   CL 102 12/05/2012 0845   CO2 25 06/01/2014 1143   CO2 23 11/17/2013 0650   BUN 16.8 06/01/2014 1143   BUN 44* 11/17/2013 0650   CREATININE 1.2 06/01/2014 1143   CREATININE 3.22* 11/17/2013 0650   CREATININE 1.88* 06/27/2012 1039      Component Value Date/Time   CALCIUM 9.5 06/01/2014 1143   CALCIUM 6.8* 11/17/2013 0650   ALKPHOS 65 06/01/2014 1143   ALKPHOS 78 11/16/2013 0650   AST 20 06/01/2014 1143   AST 23 11/16/2013 0650   ALT 17 06/01/2014 1143   ALT 11 11/16/2013 0650   BILITOT 1.01 06/01/2014 1143   BILITOT 0.8 11/16/2013 0650     ASSESSMENT & PLAN:  Multiple myeloma He has mild  progression of  disease with detectable M spike and elevated light chains. I discussed options with the patient and his wife. I would recommend either consideration to increase the dose of Revlimid, refer him back to [facility] for bone marrow transplant again, or consideration for clinical trial with Elotuzumab and Revlimid. I discussed with the patient the rationale behind each treatment options and he would like to think about it. I have redrawn and his blood work today, results will not be available until a week from now. In the meantime, I will proceed with chemotherapy as scheduled without dosage adjustment.  Malignant neoplasm of prostate Clinically, he has no symptoms. Continue surveillance program under urologist  Anemia in neoplastic disease This is likely due to recent treatment. The patient denies recent history of bleeding such as epistaxis, hematuria or hematochezia. He is asymptomatic from the anemia. I will observe for now.  He does not require transfusion now. I will continue the chemotherapy at current dose without dosage adjustment.  If the anemia gets progressive worse in the future, I might have to delay his treatment or adjust the chemotherapy dose.  I will contact his transplant physician at [facility] for an opinion as well.  Orders Placed This Encounter  Procedures  . SPEP & IFE with QIG    Standing Status: Future     Number of Occurrences:      Standing Expiration Date: 07/06/2015  . Kappa/lambda light chains    Standing Status: Future     Number of Occurrences:      Standing Expiration Date: 07/06/2015   All questions were answered. The patient knows to call the clinic with any problems, questions or concerns. No barriers to learning was detected. I spent 40 minutes counseling the patient face to face. The total time spent in the appointment was 60 minutes and more than 50% was on counseling and review of test results     [facility], [place], MD 06/01/2014 2:29 PM

## 2014-06-02 ENCOUNTER — Ambulatory Visit (HOSPITAL_BASED_OUTPATIENT_CLINIC_OR_DEPARTMENT_OTHER): Payer: Medicare Other

## 2014-06-02 VITALS — BP 133/64 | HR 77 | Temp 98.0°F | Resp 20

## 2014-06-02 DIAGNOSIS — Z5112 Encounter for antineoplastic immunotherapy: Secondary | ICD-10-CM

## 2014-06-02 DIAGNOSIS — C9000 Multiple myeloma not having achieved remission: Secondary | ICD-10-CM

## 2014-06-02 MED ORDER — ONDANSETRON 8 MG/50ML IVPB (CHCC)
8.0000 mg | Freq: Once | INTRAVENOUS | Status: AC
  Administered 2014-06-02: 8 mg via INTRAVENOUS

## 2014-06-02 MED ORDER — DEXAMETHASONE SODIUM PHOSPHATE 10 MG/ML IJ SOLN
10.0000 mg | Freq: Once | INTRAMUSCULAR | Status: AC
  Administered 2014-06-02: 10 mg via INTRAVENOUS

## 2014-06-02 MED ORDER — SODIUM CHLORIDE 0.9 % IV SOLN
Freq: Once | INTRAVENOUS | Status: AC
  Administered 2014-06-02: 15:00:00 via INTRAVENOUS

## 2014-06-02 MED ORDER — HEPARIN SOD (PORK) LOCK FLUSH 100 UNIT/ML IV SOLN
500.0000 [IU] | Freq: Once | INTRAVENOUS | Status: AC | PRN
  Administered 2014-06-02: 500 [IU]
  Filled 2014-06-02: qty 5

## 2014-06-02 MED ORDER — DEXAMETHASONE SODIUM PHOSPHATE 10 MG/ML IJ SOLN
INTRAMUSCULAR | Status: AC
  Filled 2014-06-02: qty 1

## 2014-06-02 MED ORDER — ONDANSETRON 8 MG/NS 50 ML IVPB
INTRAVENOUS | Status: AC
  Filled 2014-06-02: qty 8

## 2014-06-02 MED ORDER — CARFILZOMIB CHEMO INJECTION 60 MG
20.0000 mg/m2 | Freq: Once | INTRAVENOUS | Status: AC
  Administered 2014-06-02: 38 mg via INTRAVENOUS
  Filled 2014-06-02: qty 19

## 2014-06-02 MED ORDER — SODIUM CHLORIDE 0.9 % IJ SOLN
10.0000 mL | INTRAMUSCULAR | Status: DC | PRN
  Administered 2014-06-02: 10 mL
  Filled 2014-06-02: qty 10

## 2014-06-02 NOTE — Patient Instructions (Signed)
[facility] Discharge Instructions for Patients Receiving Chemotherapy  Today you received the following chemotherapy agents: Kyprolis  To help prevent nausea and vomiting after your treatment, we encourage you to take your nausea medication as prescribed by your physician.   If you develop nausea and vomiting that is not controlled by your nausea medication, call the clinic.   BELOW ARE SYMPTOMS THAT SHOULD BE REPORTED IMMEDIATELY:  *FEVER GREATER THAN 100.5 F  *CHILLS WITH OR WITHOUT FEVER  NAUSEA AND VOMITING THAT IS NOT CONTROLLED WITH YOUR NAUSEA MEDICATION  *UNUSUAL SHORTNESS OF BREATH  *UNUSUAL BRUISING OR BLEEDING  TENDERNESS IN MOUTH AND THROAT WITH OR WITHOUT PRESENCE OF ULCERS  *URINARY PROBLEMS  *BOWEL PROBLEMS  UNUSUAL RASH Items with * indicate a potential emergency and should be followed up as soon as possible.  Feel free to call the clinic you have any questions or concerns. The clinic phone number is [phone].    

## 2014-06-03 IMAGING — CT CT BIOPSY
1 series · 1 of 21 positions shown · non-contrast
Comparison: none

Clinical Data/Indication: MULTIPLE MYELOMA

CT-GUIDED bone marrow aspirate and core biopsy.
Procedure: The procedure, risks, benefits, and alternatives were
explained to the patient. Questions regarding the procedure were
encouraged and answered. The patient understands and consents to
the procedure.
The back was prepped with betadine in a sterile fashion, and a
sterile drape was applied covering the operative field. A sterile
gown and sterile gloves were used for the procedure.
Under CT guidance, 11 gauge needle was inserted into the right
iliac bone via posterior approach.  Aspirates were obtained.  A
core was obtained. Final imaging was performed.
Patient tolerated the procedure well without complication.  Vital
sign monitoring by nursing staff during the procedure will continue
as patient is in the special procedures unit for post procedure
observation.

[Series 2: localizer · axial · 5.0mm · 0.74mm/px · 1 of 21 slices shown]
[im 11/21]
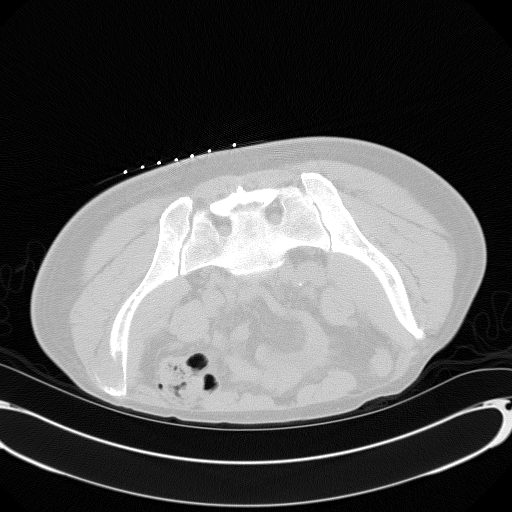

[1 of 21 positions shown; findings below may reference images not displayed]

FINDINGS: The images document guide needle placement within the
right iliac bone.  Post biopsy images demonstrate no evidence of
hemorrhage.
IMPRESSION: Successful CT-guided bone marrow aspirate and core.

## 2014-06-03 NOTE — Progress Notes (Addendum)
Late entry for 06/02/14 : 1455  -  Pt here for day 2 of  Kyprolis.  Pt noticed one tooth on left side of mouth " seems like it is lower than other teeth ".  Pt denied bleeding, denied mouth tenderness or sore, denied any open wounds on gum line.  Pt is also taking Zometa monthly.  Pt wanted to know if he could speak with a pharmacist.  Instructed pt that nurse will ask pharmacist to talk to pt; however, pt needs to see his dentist soon and then discuss tooth issue with Dr. Alvy Bimler prior to next Zometa infusion.  Pt voiced understanding and stated he would contact his dentist in the am 06/03/14.   Lattie Haw, pharmacist spoke with pt , and in agreement with nurse's suggestions to pt .

## 2014-06-07 ENCOUNTER — Telehealth: Payer: Self-pay | Admitting: Hematology and Oncology

## 2014-06-07 NOTE — Telephone Encounter (Signed)
s.w. pt and advised on MD visit on 10.27..pt ok and aware

## 2014-06-07 NOTE — Telephone Encounter (Signed)
I spoke with his transplant physician last week. I informed her about the patient's clinical progression. She does not recommend repeat transplant. She recommend a trial of increasing Revlimid dose back to 10 mg or to consider changing the immunomodulating to Pomalidomide, at maximum 2 mg daily or consideration for clinical call with bendamustine/Pomalidomide at [facility]. I have informed the patient of multiple different options and plan to see him next week for further discussion face-to-face in the office.

## 2014-06-08 ENCOUNTER — Ambulatory Visit (HOSPITAL_BASED_OUTPATIENT_CLINIC_OR_DEPARTMENT_OTHER): Payer: Medicare Other

## 2014-06-08 ENCOUNTER — Other Ambulatory Visit (HOSPITAL_BASED_OUTPATIENT_CLINIC_OR_DEPARTMENT_OTHER): Payer: Medicare Other

## 2014-06-08 VITALS — BP 155/74 | HR 80 | Temp 98.7°F | Resp 24

## 2014-06-08 DIAGNOSIS — Z5112 Encounter for antineoplastic immunotherapy: Secondary | ICD-10-CM

## 2014-06-08 DIAGNOSIS — C9000 Multiple myeloma not having achieved remission: Secondary | ICD-10-CM

## 2014-06-08 DIAGNOSIS — Z23 Encounter for immunization: Secondary | ICD-10-CM

## 2014-06-08 LAB — CBC WITH DIFFERENTIAL/PLATELET
BASO%: 0.6 % (ref 0.0–2.0)
Basophils Absolute: 0 10*3/uL (ref 0.0–0.1)
EOS ABS: 0.2 10*3/uL (ref 0.0–0.5)
EOS%: 2.9 % (ref 0.0–7.0)
HEMATOCRIT: 33.5 % — AB (ref 38.4–49.9)
HGB: 11.4 g/dL — ABNORMAL LOW (ref 13.0–17.1)
LYMPH%: 17.2 % (ref 14.0–49.0)
MCH: 33.6 pg — ABNORMAL HIGH (ref 27.2–33.4)
MCHC: 34 g/dL (ref 32.0–36.0)
MCV: 98.8 fL — ABNORMAL HIGH (ref 79.3–98.0)
MONO#: 0.8 10*3/uL (ref 0.1–0.9)
MONO%: 12.4 % (ref 0.0–14.0)
NEUT#: 4.3 10*3/uL (ref 1.5–6.5)
NEUT%: 66.9 % (ref 39.0–75.0)
PLATELETS: 107 10*3/uL — AB (ref 140–400)
RBC: 3.39 10*6/uL — ABNORMAL LOW (ref 4.20–5.82)
RDW: 15 % — ABNORMAL HIGH (ref 11.0–14.6)
WBC: 6.5 10*3/uL (ref 4.0–10.3)
lymph#: 1.1 10*3/uL (ref 0.9–3.3)
nRBC: 1 % — ABNORMAL HIGH (ref 0–0)

## 2014-06-08 LAB — COMPREHENSIVE METABOLIC PANEL (CC13)
ALBUMIN: 3.5 g/dL (ref 3.5–5.0)
ALT: 17 U/L (ref 0–55)
ANION GAP: 12 meq/L — AB (ref 3–11)
AST: 22 U/L (ref 5–34)
Alkaline Phosphatase: 68 U/L (ref 40–150)
BUN: 15.8 mg/dL (ref 7.0–26.0)
CALCIUM: 9.5 mg/dL (ref 8.4–10.4)
CHLORIDE: 106 meq/L (ref 98–109)
CO2: 25 meq/L (ref 22–29)
CREATININE: 1.1 mg/dL (ref 0.7–1.3)
GLUCOSE: 88 mg/dL (ref 70–140)
Potassium: 3.5 mEq/L (ref 3.5–5.1)
Sodium: 143 mEq/L (ref 136–145)
Total Bilirubin: 0.94 mg/dL (ref 0.20–1.20)
Total Protein: 6.4 g/dL (ref 6.4–8.3)

## 2014-06-08 LAB — TECHNOLOGIST REVIEW

## 2014-06-08 MED ORDER — SODIUM CHLORIDE 0.9 % IV SOLN
Freq: Once | INTRAVENOUS | Status: AC
  Administered 2014-06-08: 14:00:00 via INTRAVENOUS

## 2014-06-08 MED ORDER — SODIUM CHLORIDE 0.9 % IV SOLN
Freq: Once | INTRAVENOUS | Status: AC
Start: 2014-06-08 — End: 2014-06-08
  Administered 2014-06-08: 15:00:00 via INTRAVENOUS

## 2014-06-08 MED ORDER — ONDANSETRON 8 MG/50ML IVPB (CHCC)
8.0000 mg | Freq: Once | INTRAVENOUS | Status: AC
  Administered 2014-06-08: 8 mg via INTRAVENOUS

## 2014-06-08 MED ORDER — CARFILZOMIB CHEMO INJECTION 60 MG
20.0000 mg/m2 | Freq: Once | INTRAVENOUS | Status: AC
  Administered 2014-06-08: 38 mg via INTRAVENOUS
  Filled 2014-06-08: qty 19

## 2014-06-08 MED ORDER — DEXAMETHASONE SODIUM PHOSPHATE 10 MG/ML IJ SOLN
INTRAMUSCULAR | Status: AC
  Filled 2014-06-08: qty 1

## 2014-06-08 MED ORDER — DEXAMETHASONE SODIUM PHOSPHATE 10 MG/ML IJ SOLN
10.0000 mg | Freq: Once | INTRAMUSCULAR | Status: AC
  Administered 2014-06-08: 10 mg via INTRAVENOUS

## 2014-06-08 MED ORDER — HEPARIN SOD (PORK) LOCK FLUSH 100 UNIT/ML IV SOLN
500.0000 [IU] | Freq: Once | INTRAVENOUS | Status: AC | PRN
  Administered 2014-06-08: 500 [IU]
  Filled 2014-06-08: qty 5

## 2014-06-08 MED ORDER — ONDANSETRON 8 MG/NS 50 ML IVPB
INTRAVENOUS | Status: AC
Start: 2014-06-08 — End: 2014-06-08
  Filled 2014-06-08: qty 8

## 2014-06-08 MED ORDER — SODIUM CHLORIDE 0.9 % IJ SOLN
10.0000 mL | INTRAMUSCULAR | Status: DC | PRN
  Administered 2014-06-08: 10 mL
  Filled 2014-06-08: qty 10

## 2014-06-08 NOTE — Patient Instructions (Signed)
[facility] Discharge Instructions for Patients Receiving Chemotherapy  Today you received the following chemotherapy agents: Carfilzomib.  To help prevent nausea and vomiting after your treatment, we encourage you to take your nausea medication, Compazine. Take one every six hours as needed.   If you develop nausea and vomiting that is not controlled by your nausea medication, call the clinic.   BELOW ARE SYMPTOMS THAT SHOULD BE REPORTED IMMEDIATELY:  *FEVER GREATER THAN 100.5 F  *CHILLS WITH OR WITHOUT FEVER  NAUSEA AND VOMITING THAT IS NOT CONTROLLED WITH YOUR NAUSEA MEDICATION  *UNUSUAL SHORTNESS OF BREATH  *UNUSUAL BRUISING OR BLEEDING  TENDERNESS IN MOUTH AND THROAT WITH OR WITHOUT PRESENCE OF ULCERS  *URINARY PROBLEMS  *BOWEL PROBLEMS  UNUSUAL RASH Items with * indicate a potential emergency and should be followed up as soon as possible.  Feel free to call the clinic should you have any questions or concerns. The clinic phone number is [phone].    

## 2014-06-09 ENCOUNTER — Ambulatory Visit: Payer: Medicare Other

## 2014-06-09 ENCOUNTER — Telehealth: Payer: Self-pay | Admitting: *Deleted

## 2014-06-09 NOTE — Telephone Encounter (Signed)
Informed pt that unfortunately,  Kyprolis, is not working, according to Dr. Alvy Bimler.  His light chains have gone up.  Pt does not need to keep appt today for his Kyprolis treatment.  Please come on Tuesday as scheduled to see Dr. Alvy Bimler and discuss treatment options.  Pt verbalized understanding.

## 2014-06-11 LAB — SPEP & IFE WITH QIG
Albumin ELP: 63.2 % (ref 55.8–66.1)
Alpha-1-Globulin: 6.3 % — ABNORMAL HIGH (ref 2.9–4.9)
Alpha-2-Globulin: 13 % — ABNORMAL HIGH (ref 7.1–11.8)
BETA 2: 5.4 % (ref 3.2–6.5)
BETA GLOBULIN: 6.7 % (ref 4.7–7.2)
GAMMA GLOBULIN: 5.4 % — AB (ref 11.1–18.8)
IGA: 16 mg/dL — AB (ref 68–379)
IgG (Immunoglobin G), Serum: 249 mg/dL — ABNORMAL LOW (ref 650–1600)
IgM, Serum: 10 mg/dL — ABNORMAL LOW (ref 41–251)
M-Spike, %: 0.07 g/dL
Total Protein, Serum Electrophoresis: 6.1 g/dL (ref 6.0–8.3)

## 2014-06-11 LAB — KAPPA/LAMBDA LIGHT CHAINS
Kappa free light chain: 0.03 mg/dL — ABNORMAL LOW (ref 0.33–1.94)
Kappa:Lambda Ratio: 0 — ABNORMAL LOW (ref 0.26–1.65)
Lambda Free Lght Chn: 283 mg/dL — ABNORMAL HIGH (ref 0.57–2.63)

## 2014-06-15 ENCOUNTER — Ambulatory Visit: Payer: Medicare Other

## 2014-06-15 ENCOUNTER — Ambulatory Visit (HOSPITAL_BASED_OUTPATIENT_CLINIC_OR_DEPARTMENT_OTHER): Payer: Medicare Other | Admitting: Hematology and Oncology

## 2014-06-15 ENCOUNTER — Other Ambulatory Visit (HOSPITAL_BASED_OUTPATIENT_CLINIC_OR_DEPARTMENT_OTHER): Payer: Medicare Other

## 2014-06-15 VITALS — BP 160/61 | HR 79 | Temp 97.8°F | Resp 18 | Ht 69.5 in | Wt 164.1 lb

## 2014-06-15 DIAGNOSIS — Z23 Encounter for immunization: Secondary | ICD-10-CM

## 2014-06-15 DIAGNOSIS — C9000 Multiple myeloma not having achieved remission: Secondary | ICD-10-CM

## 2014-06-15 LAB — CBC WITH DIFFERENTIAL/PLATELET
BASO%: 0.4 % (ref 0.0–2.0)
BASOS ABS: 0 10*3/uL (ref 0.0–0.1)
EOS%: 2.3 % (ref 0.0–7.0)
Eosinophils Absolute: 0.2 10*3/uL (ref 0.0–0.5)
HEMATOCRIT: 33.2 % — AB (ref 38.4–49.9)
HEMOGLOBIN: 10.9 g/dL — AB (ref 13.0–17.1)
LYMPH#: 1 10*3/uL (ref 0.9–3.3)
LYMPH%: 14.9 % (ref 14.0–49.0)
MCH: 33 pg (ref 27.2–33.4)
MCHC: 32.9 g/dL (ref 32.0–36.0)
MCV: 100.5 fL — AB (ref 79.3–98.0)
MONO#: 1.5 10*3/uL — ABNORMAL HIGH (ref 0.1–0.9)
MONO%: 21.7 % — ABNORMAL HIGH (ref 0.0–14.0)
NEUT#: 4.1 10*3/uL (ref 1.5–6.5)
NEUT%: 60.7 % (ref 39.0–75.0)
Platelets: 100 10*3/uL — ABNORMAL LOW (ref 140–400)
RBC: 3.31 10*6/uL — ABNORMAL LOW (ref 4.20–5.82)
RDW: 15.5 % — ABNORMAL HIGH (ref 11.0–14.6)
WBC: 6.7 10*3/uL (ref 4.0–10.3)

## 2014-06-15 LAB — COMPREHENSIVE METABOLIC PANEL (CC13)
ALT: 18 U/L (ref 0–55)
ANION GAP: 12 meq/L — AB (ref 3–11)
AST: 26 U/L (ref 5–34)
Albumin: 3.6 g/dL (ref 3.5–5.0)
Alkaline Phosphatase: 69 U/L (ref 40–150)
BILIRUBIN TOTAL: 0.99 mg/dL (ref 0.20–1.20)
BUN: 16.5 mg/dL (ref 7.0–26.0)
CALCIUM: 9.7 mg/dL (ref 8.4–10.4)
CHLORIDE: 106 meq/L (ref 98–109)
CO2: 25 mEq/L (ref 22–29)
Creatinine: 1.2 mg/dL (ref 0.7–1.3)
Glucose: 109 mg/dl (ref 70–140)
Potassium: 3.3 mEq/L — ABNORMAL LOW (ref 3.5–5.1)
Sodium: 143 mEq/L (ref 136–145)
Total Protein: 6.3 g/dL — ABNORMAL LOW (ref 6.4–8.3)

## 2014-06-15 LAB — TECHNOLOGIST REVIEW: Technologist Review: 4

## 2014-06-15 NOTE — Assessment & Plan Note (Signed)
Unfortunately, the patient has signs of disease progression. I have discussed his plan of care with his transplant physician last week. She does not recommend second transplant. Treatment options are fully discussed with him and family, including Bendamustine, Pomalidomide or combination therapy in the context of clinical trial at [facility]. I suspect his untreated prostate cancer would be the deal breaker making him ineligible for clinical trial. He will discuss further with his family later this week and will inform me with the plan of care/choice of treatment. In the mean time, I recommend he continues his currently prescription of Revlimid until his prescription runs out.

## 2014-06-15 NOTE — Progress Notes (Signed)
[facility] OFFICE PROGRESS NOTE  Patient Care Team: Neena Rhymes, MD as PCP - General Linton Rump, MD (Ophthalmology) Bernestine Amass, MD (Urology) Marily Memos, MD (Orthopedic Surgery) Jeanann Lewandowsky, MD as Consulting Physician (Hematology and Oncology)  SUMMARY OF ONCOLOGIC HISTORY:   Multiple myeloma   05/21/2012 Initial Diagnosis Multiple myeloma, ISS stage III.   06/19/2012 Imaging Lytic lesions in the calvaria and right hemipelvis and a possible lesion in the right humerus.    06/20/2012 Imaging CT scan of the right humerus showed a 2.3 cm lucent lesion with probable mild endosteal thinning anteriorly near the bicipital groove.  several small lytic lesions in humeral head, scapula, glenoid, and proximal radius.     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 Plasma cells are lambda ligh chain restricted.    06/24/2012 Pathology Results Cytogenetics revealed the presence of normal male chormosomes with no observable clonal abnormalities.  Single cell with additional chromosome material on 3q, loss of 13 .  DNA, gain of chromosome 11.    07/01/2012 - 01/02/2013 Chemotherapy Velcade, cytoxan and decadron. doses were increased on 11/06/2012.  Neulasta was started on 08/16/2012 because of leukopenia.    07/10/2012 - 07/28/2012 Radiation Therapy He received XRT to the right humerus 25 Gy in 10 fractions.    12/04/2012 Bone Marrow Biopsy Hypercllular for age with trilineage hematopoiesis in addition to increased number of atypical plasma cells estimated at 24% of all cells in the aspirate. Lamda light chain restriction. rare circulating plasma cells.   12/04/2012 Pathology Results Cytogenetics revealed the presence of 2 clonal cell lines. first line with chromosal normal (25%).  2nd line was abnormal, missing a Y chromosome. A single cell with an extra chromosome 11,14,15 and 22  with an 11;14 translocation. FISH showed gain of 11.    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  S/p melphalan 140 mg /m2 on 01/27/2013.    06/27/2013 Tumor Marker 24-hour urine yielded 17.1 gram of protein of which 16.9 grams was free lambda light chains.  Beta 2 microglobulin was 5.8. Albumin 3.2. IgG 6,320.    07/07/2013 - 09/01/2013 Chemotherapy Maintenance velcade started.   09/21/2013 Progression Counts decreasing; WBC followed by Plts. Referred to [facility] for recommendations for next therapy.    10/12/2013 Bone Marrow Biopsy Done at [facility].  Per care everywhere, PLASMA CELLS COMPRISE 80% OF A PACKED BONE MARROW.    10/22/2013 Tumor Marker Labs done at [facility].  spep m-spike 0.08, FLC lambda 379.00, ratio 0. 24 hour urine, m-spike 16632, IFE positive for monoclonal lambda.   10/27/2013 - 06/08/2014 Chemotherapy Started salvage chemotherapy with carlfozomib plus dexamethasone plus revlimid.    11/09/2013 - 11/17/2013 Hospital Admission Admitted with fever.  Started on broad spectrum antibiotics. Had severe anemia and was give hemoglobin with questionable tranfusion reaction. 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 Discharged off antibiotics.    01/21/2014 -  Chemotherapy Zometa 3.5 mg monthly started.    03/02/2014 Treatment Plan Change Decreased Revlimide to 5 mg (down from 10 mg) daily for 21 days on and 7 days off every 28 days due persistent leukopenia.    06/15/2014 Progression Serum light chains and M Spike are worse. Treatment was discontinued    INTERVAL HISTORY: Please see below for problem oriented charting. He feels well. Denies recent infection. No new bone pain. He complained of recent nasal drainage and hoarseness but denies fever or sore  throat.  REVIEW OF SYSTEMS:   Constitutional: Denies fevers, chills or abnormal weight loss Eyes: Denies blurriness of vision Ears, nose, mouth, throat, and face: Denies mucositis or sore throat Respiratory: Denies cough,  dyspnea or wheezes Cardiovascular: Denies palpitation, chest discomfort or lower extremity swelling Gastrointestinal:  Denies nausea, heartburn or change in bowel habits Skin: Denies abnormal skin rashes Lymphatics: Denies new lymphadenopathy or easy bruising Neurological:Denies numbness, tingling or new weaknesses Behavioral/Psych: Mood is stable, no new changes  All other systems were reviewed with the patient and are negative.  I have reviewed the past medical history, past surgical history, social history and family history with the patient and they are unchanged from previous note.  ALLERGIES:  has No Known Allergies.  MEDICATIONS:  Current Outpatient Prescriptions  Medication Sig Dispense Refill  . acetaminophen (TYLENOL) 500 MG tablet Take 1,000 mg by mouth every 6 (six) hours as needed. For pain      . acyclovir (ZOVIRAX) 400 MG tablet Take 400 mg by mouth 2 (two) times daily.      Marland Kitchen allopurinol (ZYLOPRIM) 100 MG tablet Take 100 mg by mouth 2 (two) times daily.       Marland Kitchen aspirin EC 81 MG tablet Take 81 mg by mouth daily after breakfast.      . atenolol-chlorthalidone (TENORETIC) 50-25 MG per tablet Take 1 tablet by mouth every morning.      . bimatoprost (LUMIGAN) 0.03 % ophthalmic drops Place 1 drop into both eyes at bedtime.        . brimonidine-timolol (COMBIGAN) 0.2-0.5 % ophthalmic solution Place 1 drop into both eyes 2 (two) times daily.        . Cholecalciferol (VITAMIN D-3) 1000 UNITS CAPS Take 1 capsule by mouth daily after breakfast.      . dexamethasone (DECADRON) 4 MG tablet Take 5 tablets (20 mg total) by mouth once a week. At lunch  60 tablet  4  . folic acid (FOLVITE) 1 MG tablet Take 1 mg by mouth daily at 12 noon.      . lactose free nutrition (BOOST) LIQD Take 237 mLs by mouth daily.      Marland Kitchen lenalidomide (REVLIMID) 5 MG capsule Take 1 capsule (5 mg total) by mouth daily. For 3 weeks and then off for 7 days.  21 capsule  0  . lidocaine-prilocaine (EMLA) cream Apply 1  application topically as needed. Apply to port a cath site one hour prior to needle stick.  30 g  3  . loperamide (IMODIUM) 2 MG capsule Take 2 mg by mouth as needed for diarrhea or loose stools.      Marland Kitchen loratadine (CLARITIN) 10 MG tablet Take 10 mg by mouth daily as needed for allergies.      . potassium chloride SA (K-DUR,KLOR-CON) 20 MEQ tablet Take 1 tablet (20 mEq total) by mouth daily.  30 tablet  0  . prochlorperazine (COMPAZINE) 10 MG tablet Take 10 mg by mouth every 6 (six) hours as needed for nausea or vomiting.      . senna (SENOKOT) 8.6 MG TABS tablet Take 1-2 tablets by mouth daily as needed for mild constipation.       . simvastatin (ZOCOR) 20 MG tablet Take 20 mg by mouth at bedtime.      . tamsulosin (FLOMAX) 0.4 MG CAPS capsule Take 0.4 mg by mouth daily after lunch.       No current facility-administered medications for this visit.    PHYSICAL EXAMINATION: ECOG PERFORMANCE  STATUS: 0 - Asymptomatic  Filed Vitals:   06/15/14 1315  BP: 160/61  Pulse: 79  Temp: 97.8 F (36.6 C)  Resp: 18   Filed Weights   06/15/14 1315  Weight: 164 lb 1.6 oz (74.435 kg)    GENERAL:alert, no distress and comfortable SKIN: skin color, texture, turgor are normal, no rashes or significant lesions EYES: normal, Conjunctiva are pink and non-injected, sclera clear Musculoskeletal:no cyanosis of digits and no clubbing  NEURO: alert & oriented x 3 with fluent speech, no focal motor/sensory deficits  LABORATORY DATA:  I have reviewed the data as listed    Component Value Date/Time   NA 143 06/15/2014 1303   NA 138 11/17/2013 0650   K 3.3* 06/15/2014 1303   K 4.1 11/17/2013 0650   CL 101 11/17/2013 0650   CL 102 12/05/2012 0845   CO2 25 06/15/2014 1303   CO2 23 11/17/2013 0650   GLUCOSE 109 06/15/2014 1303   GLUCOSE 95 11/17/2013 0650   GLUCOSE 87 12/05/2012 0845   BUN 16.5 06/15/2014 1303   BUN 44* 11/17/2013 0650   CREATININE 1.2 06/15/2014 1303   CREATININE 3.22* 11/17/2013 0650    CREATININE 1.88* 06/27/2012 1039   CALCIUM 9.7 06/15/2014 1303   CALCIUM 6.8* 11/17/2013 0650   PROT 6.3* 06/15/2014 1303   PROT 5.6* 11/16/2013 0650   ALBUMIN 3.6 06/15/2014 1303   ALBUMIN 2.8* 11/17/2013 0650   AST 26 06/15/2014 1303   AST 23 11/16/2013 0650   ALT 18 06/15/2014 1303   ALT 11 11/16/2013 0650   ALKPHOS 69 06/15/2014 1303   ALKPHOS 78 11/16/2013 0650   BILITOT 0.99 06/15/2014 1303   BILITOT 0.8 11/16/2013 0650   GFRNONAA 18* 11/17/2013 0650   GFRAA 21* 11/17/2013 0650    No results found for this basename: SPEP, UPEP,  kappa and lambda light chains    Lab Results  Component Value Date   WBC 6.7 06/15/2014   NEUTROABS 4.1 06/15/2014   HGB 10.9* 06/15/2014   HCT 33.2* 06/15/2014   MCV 100.5* 06/15/2014   PLT 100* 06/15/2014      Chemistry      Component Value Date/Time   NA 143 06/15/2014 1303   NA 138 11/17/2013 0650   K 3.3* 06/15/2014 1303   K 4.1 11/17/2013 0650   CL 101 11/17/2013 0650   CL 102 12/05/2012 0845   CO2 25 06/15/2014 1303   CO2 23 11/17/2013 0650   BUN 16.5 06/15/2014 1303   BUN 44* 11/17/2013 0650   CREATININE 1.2 06/15/2014 1303   CREATININE 3.22* 11/17/2013 0650   CREATININE 1.88* 06/27/2012 1039      Component Value Date/Time   CALCIUM 9.7 06/15/2014 1303   CALCIUM 6.8* 11/17/2013 0650   ALKPHOS 69 06/15/2014 1303   ALKPHOS 78 11/16/2013 0650   AST 26 06/15/2014 1303   AST 23 11/16/2013 0650   ALT 18 06/15/2014 1303   ALT 11 11/16/2013 0650   BILITOT 0.99 06/15/2014 1303   BILITOT 0.8 11/16/2013 0650      ASSESSMENT & PLAN:  Multiple myeloma Unfortunately, the patient has signs of disease progression. I have discussed his plan of care with his transplant physician last week. She does not recommend second transplant. Treatment options are fully discussed with him and family, including Bendamustine, Pomalidomide or combination therapy in the context of clinical trial at [facility]. I suspect his untreated prostate cancer would be the deal breaker  making him ineligible for clinical trial. He will discuss  further with his family later this week and will inform me with the plan of care/choice of treatment. In the mean time, I recommend he continues his currently prescription of Revlimid until his prescription runs out.  Risks, benefits and side-effects of each treatment option were discussed fully and patient education hand outs were dispensed  All questions were answered. The patient knows to call the clinic with any problems, questions or concerns. No barriers to learning was detected. I spent 30 minutes counseling the patient face to face. The total time spent in the appointment was 40 minutes and more than 50% was on counseling and review of test results     [facility], [place], MD 06/15/2014 8:02 PM

## 2014-06-16 ENCOUNTER — Telehealth: Payer: Self-pay | Admitting: *Deleted

## 2014-06-16 ENCOUNTER — Ambulatory Visit: Payer: Medicare Other

## 2014-06-16 NOTE — Telephone Encounter (Signed)
Left VM for Research coordinator for Dr. Alvie Heidelberg [phone]).  Would like to discuss if pt eligible for Pomalidomide and Bendamustine trial at [facility].  Requested call back to discuss eligibility criteria.

## 2014-06-17 ENCOUNTER — Telehealth: Payer: Self-pay | Admitting: *Deleted

## 2014-06-17 NOTE — Telephone Encounter (Signed)
Pt called to inform Dr. Alvy Bimler he wants to try the Pomalidomide if he is not a candidate for the clinical trial at [facility].  Informed pt I have left a message for research nurse at [facility] to call back.  I will call again and let him know as soon as I find out if he might be eligible.  He verbalized understanding. Called Dr. Kendell Bane NP, Denice Paradise at [phone] and left her a VM to please call back to discuss if pt could be eligible.  Informed pt has untreated prostate cancer in addition to mulitple myeloma.Marland Kitchen

## 2014-06-18 ENCOUNTER — Telehealth: Payer: Self-pay | Admitting: *Deleted

## 2014-06-18 NOTE — Telephone Encounter (Signed)
Spoke w/ Kennyth Lose w/ Dr. Alvie Heidelberg.  She says pt not excluded from pomalidomide/bendamustine trial due to prostate cancer.  They will evaluate him for the study.  He has appt to see Dr. Alvie Heidelberg on 07/01/14.  Faxed over recent office note and labs to them at fax [phone].  Spoke w/ pt and he is aware of appt at [facility] on 11/12.  He will contact us if he needs anything before then.

## 2014-06-22 ENCOUNTER — Telehealth: Payer: Self-pay | Admitting: *Deleted

## 2014-06-22 NOTE — Telephone Encounter (Signed)
Pt asks if he needs any labs done here in the next few weeks?  Dr. Alvy Bimler instructs no labs needed.  Pt has appt at [facility] on 11/12 and will have labs done there.  Informed pt of no need for any labs here and we will make future appts as needed after he sees Dr. Alvie Heidelberg on 11/12. Marland Kitchen  He verbalized understanding.

## 2014-06-24 ENCOUNTER — Other Ambulatory Visit: Payer: Self-pay | Admitting: *Deleted

## 2014-06-25 ENCOUNTER — Other Ambulatory Visit: Payer: Self-pay | Admitting: Hematology and Oncology

## 2014-06-29 ENCOUNTER — Ambulatory Visit: Payer: Medicare Other

## 2014-06-29 ENCOUNTER — Ambulatory Visit: Payer: Medicare Other | Admitting: Hematology and Oncology

## 2014-06-29 ENCOUNTER — Other Ambulatory Visit: Payer: Medicare Other

## 2014-06-30 ENCOUNTER — Ambulatory Visit: Payer: Medicare Other

## 2014-07-05 ENCOUNTER — Encounter: Payer: Self-pay | Admitting: Internal Medicine

## 2014-07-05 ENCOUNTER — Ambulatory Visit (INDEPENDENT_AMBULATORY_CARE_PROVIDER_SITE_OTHER): Payer: Medicare Other | Admitting: Internal Medicine

## 2014-07-05 VITALS — BP 142/58 | HR 90 | Temp 97.8°F | Resp 14 | Ht 69.0 in | Wt 161.4 lb

## 2014-07-05 DIAGNOSIS — E876 Hypokalemia: Secondary | ICD-10-CM

## 2014-07-05 DIAGNOSIS — C9000 Multiple myeloma not having achieved remission: Secondary | ICD-10-CM

## 2014-07-05 DIAGNOSIS — E785 Hyperlipidemia, unspecified: Secondary | ICD-10-CM

## 2014-07-05 DIAGNOSIS — Z0000 Encounter for general adult medical examination without abnormal findings: Secondary | ICD-10-CM

## 2014-07-05 DIAGNOSIS — I10 Essential (primary) hypertension: Secondary | ICD-10-CM

## 2014-07-05 NOTE — Assessment & Plan Note (Signed)
Already had flu shot, last colonoscopy 2004. Would defer colonoscopy repeat at this time given concurrent multiple myeloma and prostate cancer.

## 2014-07-05 NOTE — Assessment & Plan Note (Signed)
Continue Zocor. Given concurrent treatment for his multiple myeloma will defer lipid panel today. He will likely have extensive workup prior to entering study. If no lipid panel done and will check at 6 month follow-up visit.

## 2014-07-05 NOTE — Assessment & Plan Note (Signed)
Patient currently taking atenolol/chlorthalidone. He also has mild hypokalemia and takes potassium daily. Reviewed recent BMP.

## 2014-07-05 NOTE — Assessment & Plan Note (Addendum)
Patient is status post bone marrow transplant which failed, chemotherapy as well. He has had multiple myeloma for about 2-3 years at this time. He is going to enroll in a clinical study at [facility] next week. He is currently taking medications however they are not stopping the progression of his multiple myeloma. At this time independent for all ADLs and able to drive. His medications are associated with anemia, thrombocytopenia. There is stable at this time and continue to be monitored weekly at the oncology center.

## 2014-07-05 NOTE — Progress Notes (Signed)
Pre visit review using our clinic review tool, if applicable. No additional management support is needed unless otherwise documented below in the visit note. 

## 2014-07-05 NOTE — Progress Notes (Signed)
   Subjective:    Patient ID: Trevor Michael, male    DOB: 09/25/1940, 73 y.o.   MRN: [id number]  HPI The patient is a 73 year old man who comes in today to establish care. He has past medical history of anemia, thrombocytopenia, multiple myeloma, prostatic cancer, hypertension, hyperlipidemia. He is currently undergoing treatment for his multiple myeloma however the disease is progressing despite treatment. He is going to join a study at [facility] next week. He is still very functional and does not have any limitations to his ADLs. He has occasional aches and pains. He does get some nausea and constipation with his chemotherapeutic agents. He denies fevers, chills, congestion, chest pain, shortness of breath. He denies abdominal pains. He denies any leg pain or swelling.  Review of Systems  Constitutional: Negative for fever, activity change, appetite change, fatigue and unexpected weight change.  HENT: Negative.   Respiratory: Negative for cough, chest tightness, shortness of breath and wheezing.   Cardiovascular: Negative for chest pain, palpitations and leg swelling.  Gastrointestinal: Positive for nausea and constipation. Negative for abdominal pain, diarrhea and abdominal distention.       From treatment  Musculoskeletal: Positive for arthralgias. Negative for myalgias and gait problem.  Skin: Negative.   Neurological: Positive for weakness. Negative for dizziness, light-headedness and headaches.      Objective:   Physical Exam  Constitutional: He is oriented to person, place, and time. He appears well-developed and well-nourished.  HENT:  Head: Normocephalic and atraumatic.  Eyes: EOM are normal.  Neck: Normal range of motion.  Cardiovascular: Normal rate and regular rhythm.   Pulmonary/Chest: Effort normal and breath sounds normal. No respiratory distress. He has no wheezes. He has no rales.  Abdominal: Soft. Bowel sounds are normal. He exhibits no distension. There is no tenderness.  There is no rebound.  Neurological: He is alert and oriented to person, place, and time. Coordination normal.  Skin: Skin is warm and dry.   Filed Vitals:   07/05/14 1001  BP: 142/58  Pulse: 90  Temp: 97.8 F (36.6 C)  TempSrc: Oral  Resp: 14  Height: $Remove'5\' 9"'PZbHGFt$  (1.753 m)  Weight: 161 lb 6.4 oz (73.211 kg)  SpO2: 99%      Assessment & Plan:

## 2014-07-05 NOTE — Patient Instructions (Addendum)
We will continue to monitor your progress with the myeloma. We will also watch out for blood work that they do it due next week. If we need any blood work done we will do it at the next visit in 6 months.  If you have any problems or questions before your next visit please feel free to call our office or come in for a visit sooner.  Your blood pressure medicine seems to be doing a good job at this time and we will not change it right now.

## 2014-07-05 NOTE — Assessment & Plan Note (Signed)
This is likely associated with chlorthalidone treatment. If continues to be an issue and patient wishes to get off of potassium pills can modify blood pressure regimen.

## 2014-07-06 ENCOUNTER — Ambulatory Visit: Payer: Medicare Other

## 2014-07-06 ENCOUNTER — Other Ambulatory Visit: Payer: Medicare Other

## 2014-07-07 ENCOUNTER — Ambulatory Visit: Payer: Medicare Other

## 2014-07-13 ENCOUNTER — Other Ambulatory Visit: Payer: Medicare Other

## 2014-07-13 ENCOUNTER — Ambulatory Visit: Payer: Medicare Other

## 2014-07-14 ENCOUNTER — Ambulatory Visit: Payer: Medicare Other

## 2014-07-20 ENCOUNTER — Other Ambulatory Visit: Payer: Self-pay | Admitting: Hematology and Oncology

## 2014-07-26 ENCOUNTER — Other Ambulatory Visit: Payer: Self-pay | Admitting: Internal Medicine

## 2014-07-27 ENCOUNTER — Other Ambulatory Visit: Payer: Self-pay | Admitting: Internal Medicine

## 2014-08-03 ENCOUNTER — Other Ambulatory Visit: Payer: Self-pay | Admitting: Internal Medicine

## 2014-08-09 ENCOUNTER — Other Ambulatory Visit: Payer: Self-pay | Admitting: Internal Medicine

## 2014-08-09 NOTE — Telephone Encounter (Signed)
Send to PCP.

## 2014-08-09 NOTE — Telephone Encounter (Signed)
lvm we received medication refill request for flomax. Asked pt to call back and let us know if Dr Risa Grill his urologist usually fills this. Instructed him to ask for Dr Calton Dach nurse when he calls back

## 2014-08-18 ENCOUNTER — Other Ambulatory Visit: Payer: Self-pay | Admitting: Internal Medicine

## 2014-08-21 ENCOUNTER — Other Ambulatory Visit: Payer: Self-pay | Admitting: Internal Medicine

## 2014-08-22 ENCOUNTER — Other Ambulatory Visit: Payer: Self-pay | Admitting: Internal Medicine

## 2014-09-03 ENCOUNTER — Other Ambulatory Visit: Payer: Self-pay | Admitting: Internal Medicine

## 2014-09-27 ENCOUNTER — Ambulatory Visit (INDEPENDENT_AMBULATORY_CARE_PROVIDER_SITE_OTHER): Payer: Medicare Other | Admitting: Podiatry

## 2014-09-27 VITALS — BP 122/66 | HR 78 | Resp 16

## 2014-09-27 DIAGNOSIS — B351 Tinea unguium: Secondary | ICD-10-CM

## 2014-09-27 DIAGNOSIS — R609 Edema, unspecified: Secondary | ICD-10-CM

## 2014-09-27 DIAGNOSIS — L600 Ingrowing nail: Secondary | ICD-10-CM | POA: Diagnosis not present

## 2014-09-27 DIAGNOSIS — M79673 Pain in unspecified foot: Secondary | ICD-10-CM

## 2014-09-27 NOTE — Progress Notes (Signed)
   Subjective:    Patient ID: Trevor Michael, male    DOB: [birth year], 75 y.o.   MRN: [id number]  HPI Pt presents with long, splitting toenails, denies pain   Review of Systems  Constitutional: Positive for fever, diaphoresis, appetite change and unexpected weight change.  HENT: Positive for sinus pressure and sneezing.   Eyes: Positive for redness.  Cardiovascular: Positive for leg swelling.  Gastrointestinal: Positive for nausea, diarrhea and constipation.  Endocrine: Positive for cold intolerance and heat intolerance.  Genitourinary: Positive for urgency and frequency.  Neurological: Positive for weakness and headaches.  Psychiatric/Behavioral: Positive for confusion.  All other systems reviewed and are negative.      Objective:   Physical Exam        Assessment & Plan:

## 2014-09-29 NOTE — Progress Notes (Signed)
Subjective:     Patient ID: Trevor Michael, male   DOB: [birth year], 74 y.o.   MRN: [id number]  HPI patient presents with long thick nailbeds and having cracked his left hallux secondary to length and discomfort. States all of his nails bother him and makes shoe gear difficult   Review of Systems     Objective:   Physical Exam Neurovascular status intact with thick yellow brittle nailbeds 1-5 both feet that are painful    Assessment:     Mycotic nail infection with pain 1-5 both feet    Plan:     Debride painful nailbeds 1-5 both feet with no iatrogenic bleeding noted

## 2014-10-19 DEATH — deceased

## 2015-01-10 ENCOUNTER — Ambulatory Visit: Payer: Medicare Other

## 2015-10-20 IMAGING — CR DG CHEST 1V PORT
1 series · 1 of 1 positions shown · non-contrast
Comparison: DG CHEST 2 VIEW dated 11/10/2013; DG CHEST 1V PORT dated
11/09/2013

CLINICAL DATA: Shortness of breath.

EXAM:
PORTABLE CHEST - 1 VIEW

[AP]
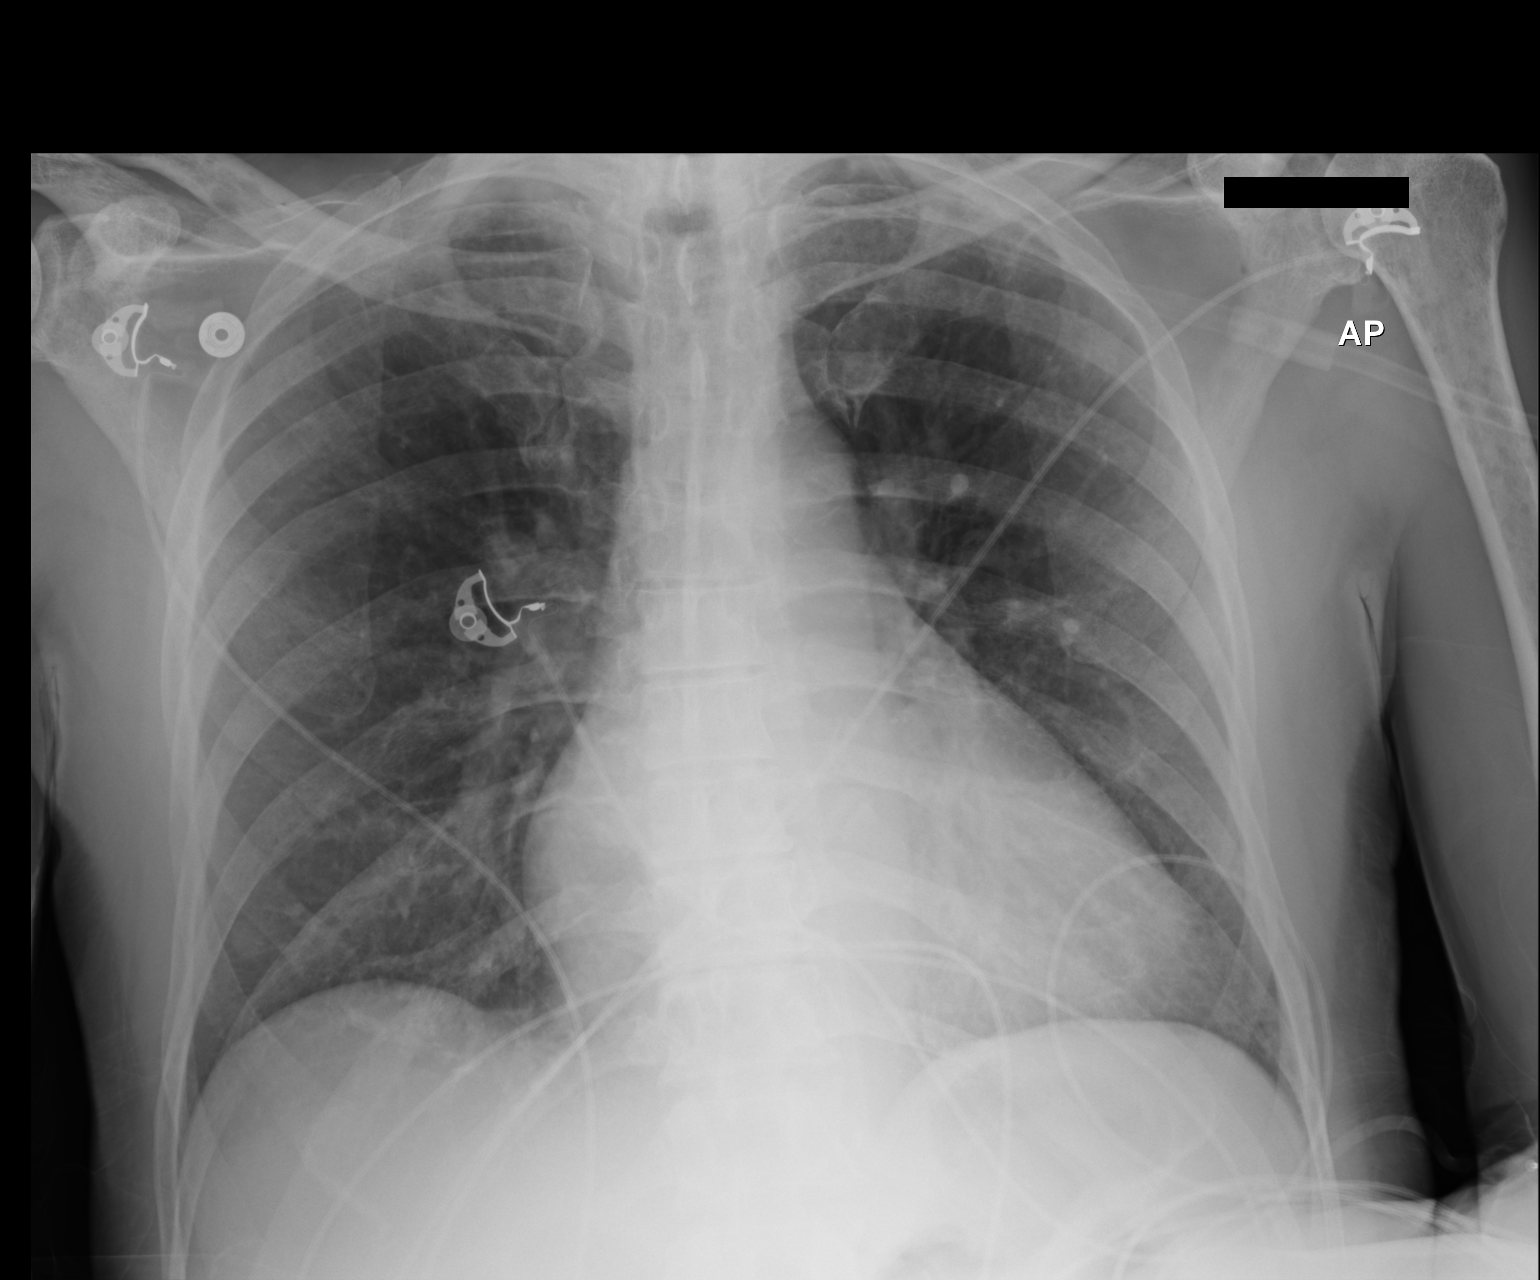

[1 of 1 positions shown; findings below may reference images not displayed]

FINDINGS: Mediastinum and hilar structures are normal. Cardiomegaly. Pulmonary
vascularity is normal. No pleural effusion or pneumothorax. Multiple
lucencies are noted throughout the thoracic cage. This is consistent
with myeloma and/or metastatic disease.
IMPRESSION: 1. No acute cardiopulmonary disease.  Stable cardiomegaly.
2. Innumerable lucencies noted throughout the thoracic cage
consistent with myeloma and/or metastatic disease.

## 2015-10-20 IMAGING — CR DG CHEST 2V
2 series · 2 of 2 positions shown · non-contrast
Comparison: 11/09/2013

CLINICAL DATA: Fever.

EXAM:
CHEST  2 VIEW

[w chest lat]
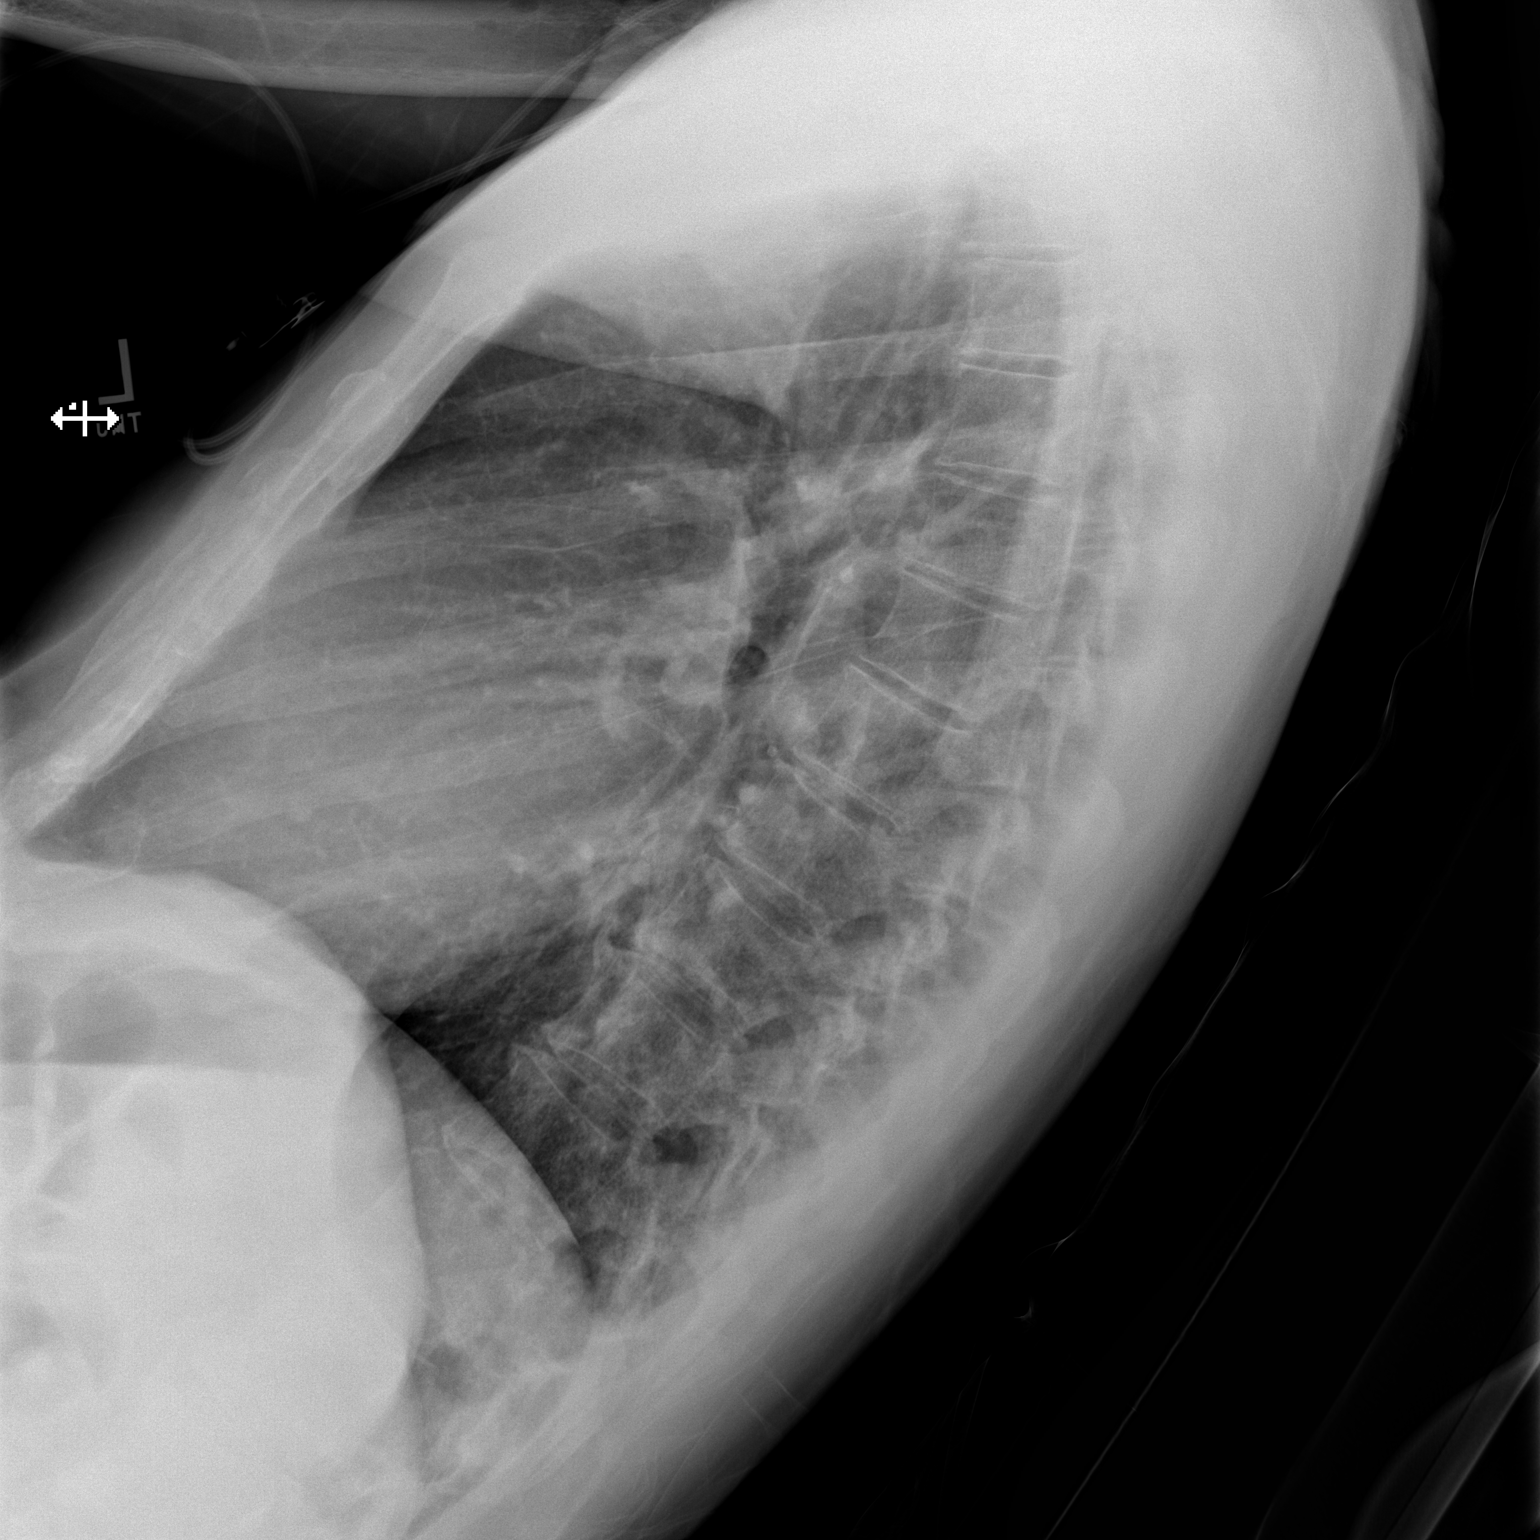

[x chest ap]
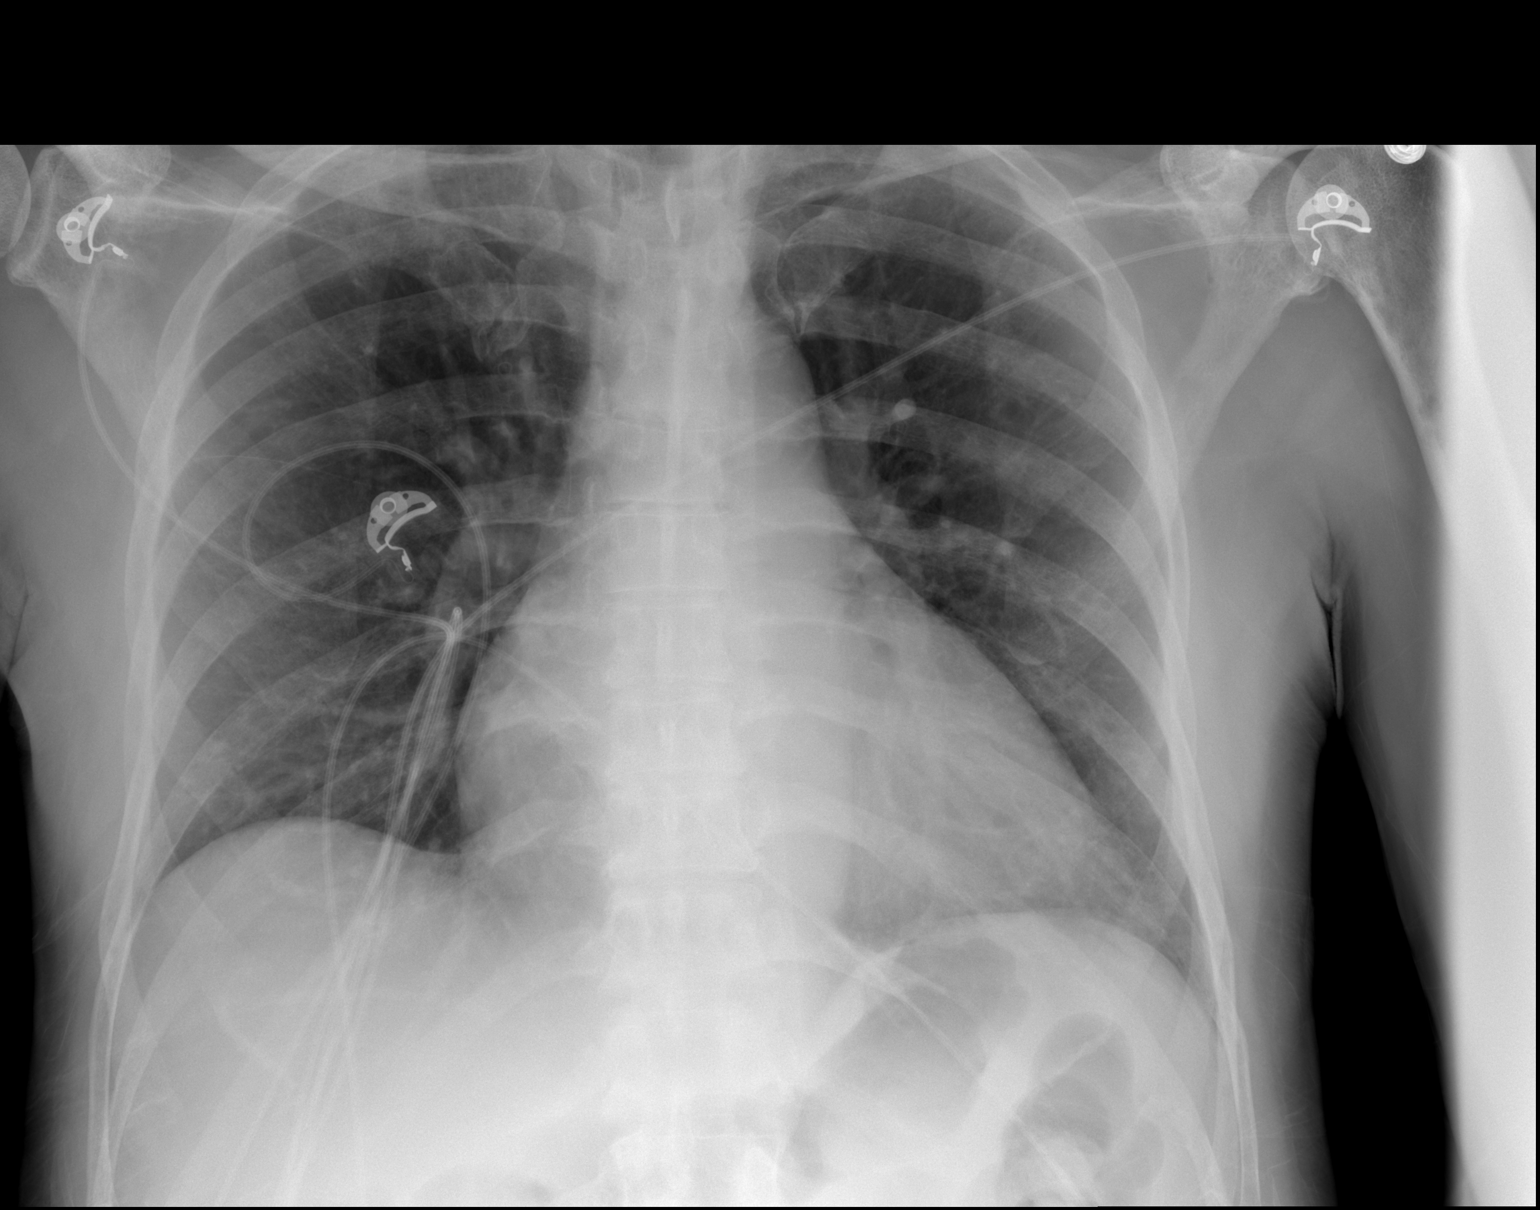

[2 of 2 positions shown; findings below may reference images not displayed]

FINDINGS: The heart size and mediastinal contours are within normal limits.
Both lungs are clear. The visualized skeletal structures are
unremarkable.
IMPRESSION: No active cardiopulmonary disease.

## 2015-10-22 IMAGING — US US RENAL
1 series · 14 of 25 positions shown · non-contrast
Comparison: CT scan abdomen and pelvis dated 06/18/2012

CLINICAL DATA: Acute renal failure.  Multiple myeloma.

EXAM:
RENAL/URINARY TRACT ULTRASOUND COMPLETE

[Series 1: us renal · 0.21mm/px · 14 of 43 slices shown]
[im 1/43]
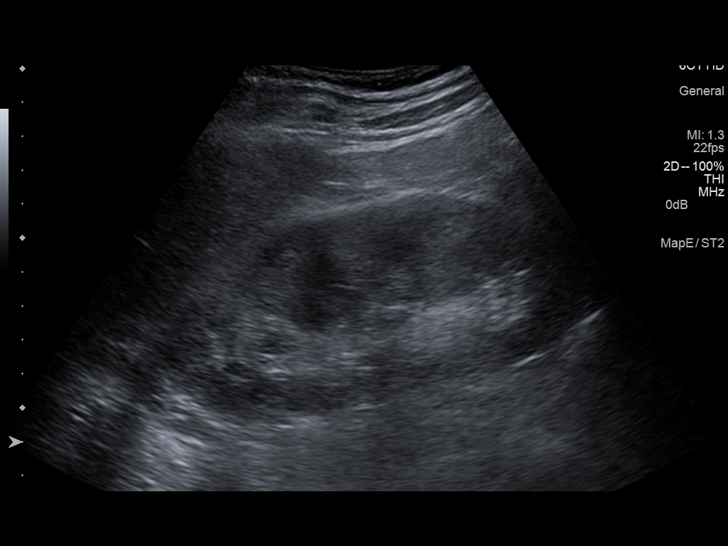
[im 4/43]
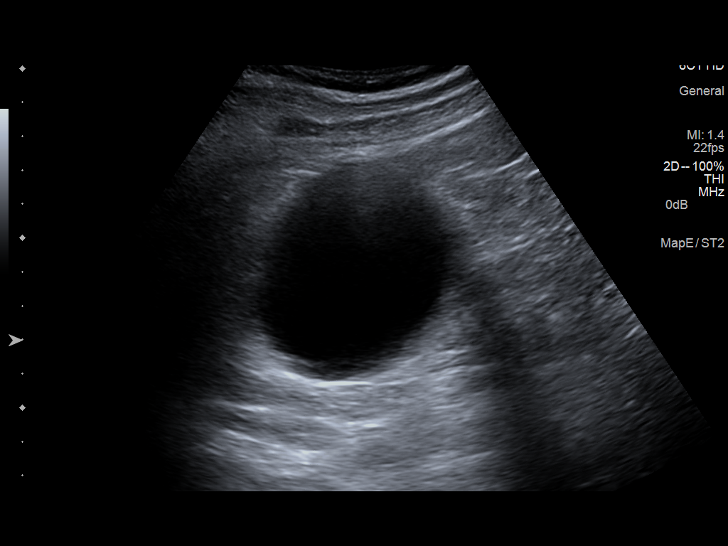
[im 8/43]
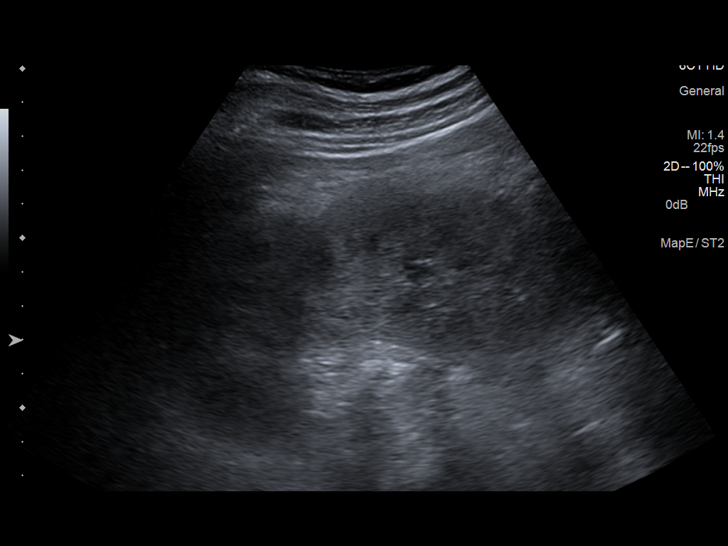
[im 11/43]
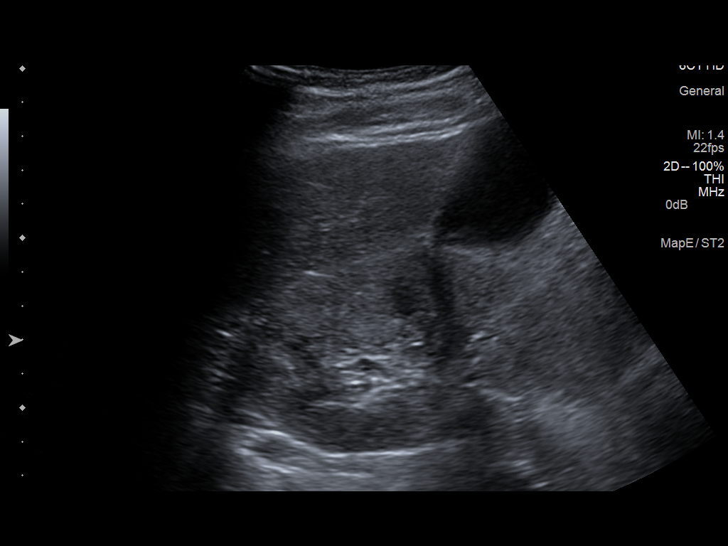
[im 15/43]
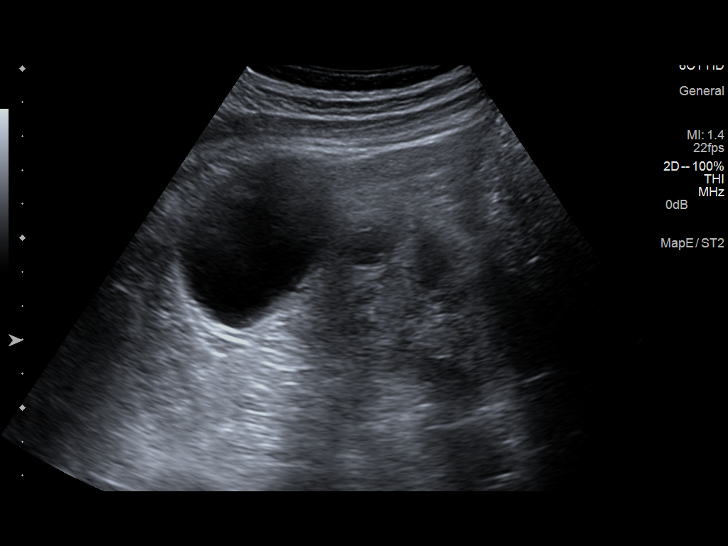
[im 16/43]
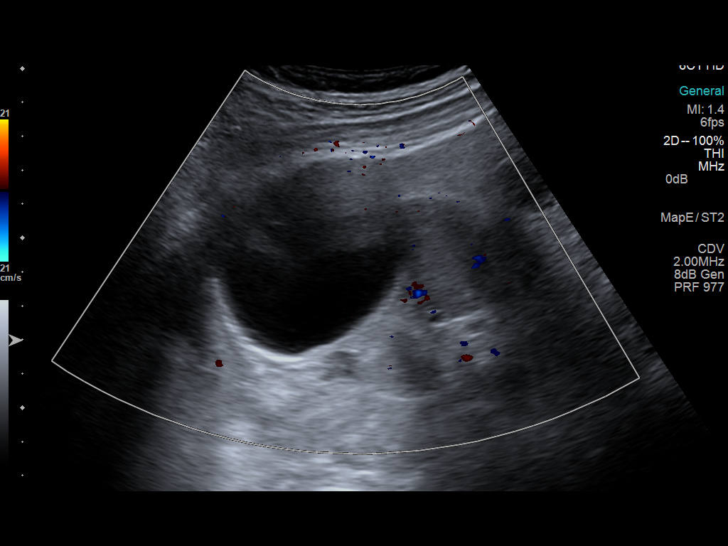
[im 20/43]
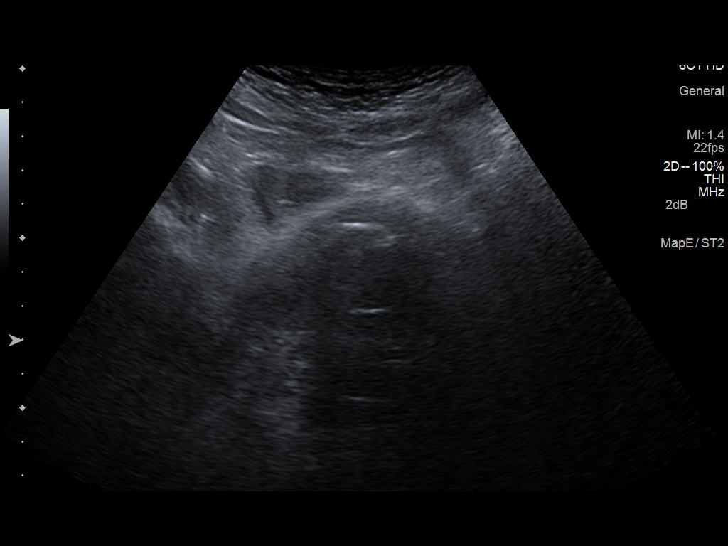
[im 23/43]
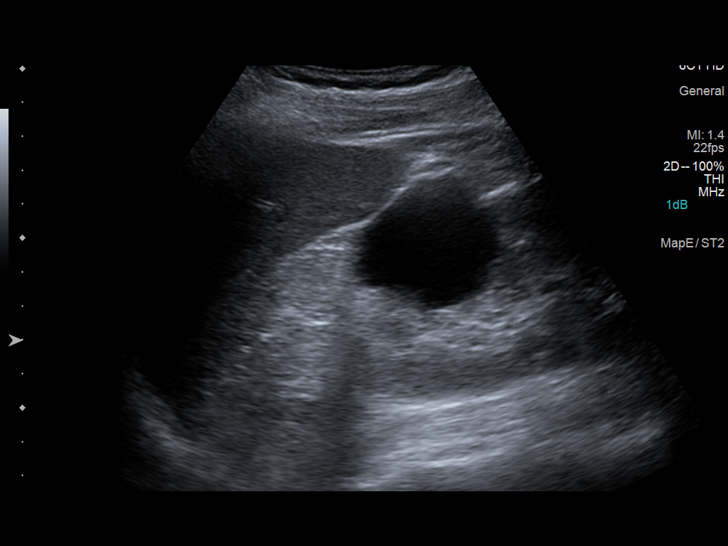
[im 27/43]
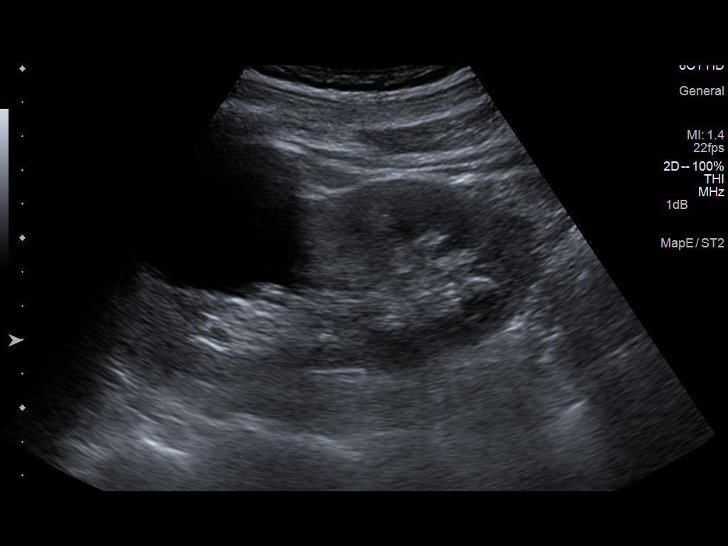
[im 29/43]
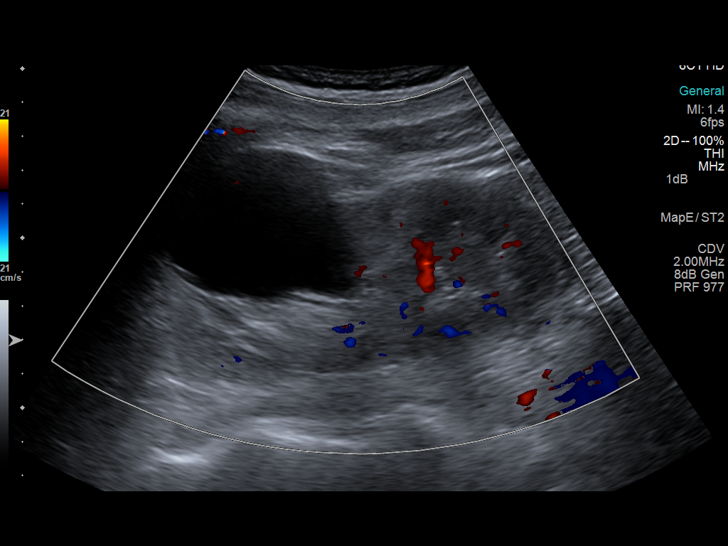
[im 32/43]
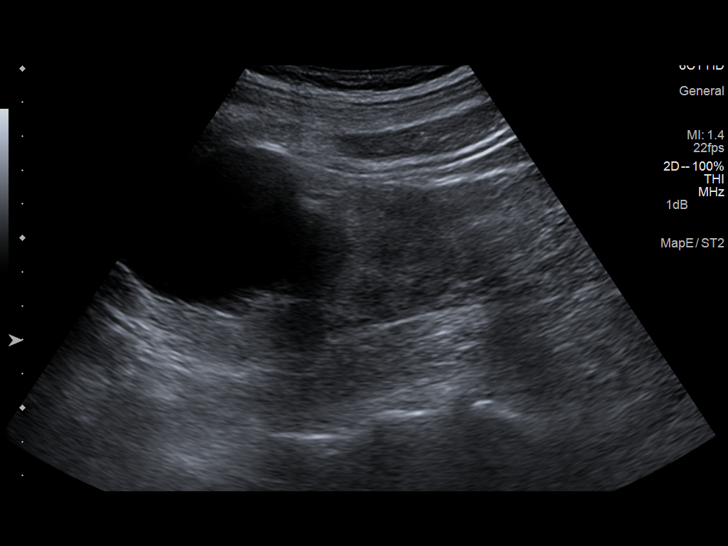
[im 36/43]
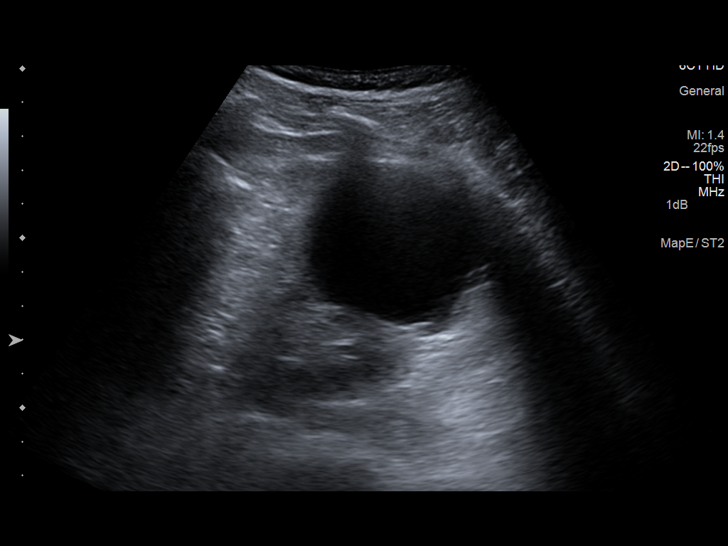
[im 39/43]
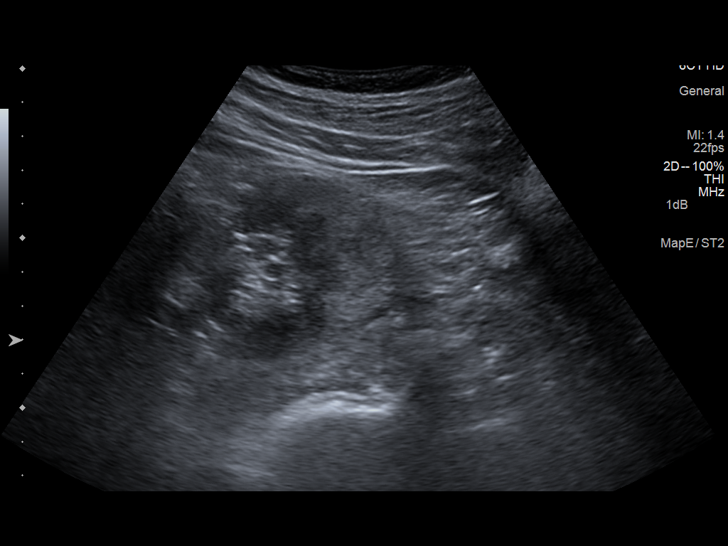
[im 43/43]
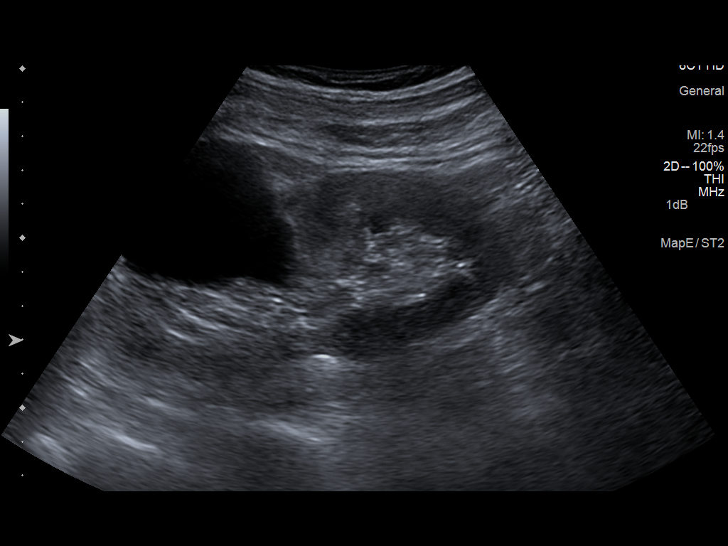

[14 of 25 positions shown; findings below may reference images not displayed]

FINDINGS: Right Kidney:

Length: 13.1 cm. Slight increased echogenicity of the renal
parenchyma. Multiple renal cysts. The largest is 6.5 cm on the mid
to lower pole. No hydronephrosis or solid mass..

Left Kidney:

Length: 13.2 cm. 5.5 cm cyst on the lateral aspect of the mid left
kidney, minimally larger than on the prior CT scan. No
hydronephrosis. Slight increased echogenicity of the renal
parenchyma.

Bladder:

Foley catheter in place.  The bladder is empty.
IMPRESSION: Echogenic renal parenchyma consistent with renal medical disease. No
obstruction. Bilateral renal cysts, stable.
